# Patient Record
Sex: Male | Born: 1945 | Race: White | Hispanic: No | Smoking: Former smoker
Health system: Southern US, Community
[De-identification: ages and names within clinical notes are randomized; demographics above are authoritative.]

## PROBLEM LIST (undated history)

## (undated) DIAGNOSIS — I493 Ventricular premature depolarization: Secondary | ICD-10-CM

## (undated) DIAGNOSIS — I351 Nonrheumatic aortic (valve) insufficiency: Secondary | ICD-10-CM

## (undated) DIAGNOSIS — G43909 Migraine, unspecified, not intractable, without status migrainosus: Secondary | ICD-10-CM

## (undated) DIAGNOSIS — E785 Hyperlipidemia, unspecified: Secondary | ICD-10-CM

## (undated) DIAGNOSIS — I10 Essential (primary) hypertension: Secondary | ICD-10-CM

## (undated) DIAGNOSIS — N200 Calculus of kidney: Secondary | ICD-10-CM

## (undated) DIAGNOSIS — Q2381 Bicuspid aortic valve: Secondary | ICD-10-CM

## (undated) DIAGNOSIS — I2510 Atherosclerotic heart disease of native coronary artery without angina pectoris: Secondary | ICD-10-CM

## (undated) DIAGNOSIS — I4892 Unspecified atrial flutter: Secondary | ICD-10-CM

## (undated) DIAGNOSIS — H269 Unspecified cataract: Secondary | ICD-10-CM

## (undated) DIAGNOSIS — E291 Testicular hypofunction: Secondary | ICD-10-CM

## (undated) DIAGNOSIS — I712 Thoracic aortic aneurysm, without rupture, unspecified: Secondary | ICD-10-CM

## (undated) DIAGNOSIS — I219 Acute myocardial infarction, unspecified: Secondary | ICD-10-CM

## (undated) DIAGNOSIS — N183 Chronic kidney disease, stage 3 unspecified: Secondary | ICD-10-CM

## (undated) DIAGNOSIS — K602 Anal fissure, unspecified: Secondary | ICD-10-CM

## (undated) DIAGNOSIS — F32A Depression, unspecified: Secondary | ICD-10-CM

## (undated) DIAGNOSIS — H3560 Retinal hemorrhage, unspecified eye: Secondary | ICD-10-CM

## (undated) DIAGNOSIS — Q231 Congenital insufficiency of aortic valve: Secondary | ICD-10-CM

## (undated) DIAGNOSIS — N400 Enlarged prostate without lower urinary tract symptoms: Secondary | ICD-10-CM

## (undated) DIAGNOSIS — G4730 Sleep apnea, unspecified: Secondary | ICD-10-CM

## (undated) DIAGNOSIS — F329 Major depressive disorder, single episode, unspecified: Secondary | ICD-10-CM

## (undated) DIAGNOSIS — M1990 Unspecified osteoarthritis, unspecified site: Secondary | ICD-10-CM

## (undated) DIAGNOSIS — I77810 Thoracic aortic ectasia: Secondary | ICD-10-CM

## (undated) DIAGNOSIS — Z9289 Personal history of other medical treatment: Secondary | ICD-10-CM

## (undated) HISTORY — DX: Unspecified osteoarthritis, unspecified site: M19.90

## (undated) HISTORY — DX: Testicular hypofunction: E29.1

## (undated) HISTORY — DX: Essential (primary) hypertension: I10

## (undated) HISTORY — DX: Sleep apnea, unspecified: G47.30

## (undated) HISTORY — DX: Acute myocardial infarction, unspecified: I21.9

## (undated) HISTORY — DX: Calculus of kidney: N20.0

## (undated) HISTORY — DX: Anal fissure, unspecified: K60.2

## (undated) HISTORY — DX: Personal history of other medical treatment: Z92.89

## (undated) HISTORY — DX: Chronic kidney disease, stage 3 unspecified: N18.30

## (undated) HISTORY — DX: Hyperlipidemia, unspecified: E78.5

## (undated) HISTORY — DX: Unspecified cataract: H26.9

## (undated) HISTORY — DX: Thoracic aortic aneurysm, without rupture: I71.2

## (undated) HISTORY — DX: Migraine, unspecified, not intractable, without status migrainosus: G43.909

## (undated) HISTORY — DX: Unspecified atrial flutter: I48.92

## (undated) HISTORY — PX: COLONOSCOPY: SHX174

## (undated) HISTORY — DX: Nonrheumatic aortic (valve) insufficiency: I35.1

## (undated) HISTORY — DX: Congenital insufficiency of aortic valve: Q23.1

## (undated) HISTORY — DX: Major depressive disorder, single episode, unspecified: F32.9

## (undated) HISTORY — DX: Depression, unspecified: F32.A

## (undated) HISTORY — PX: OTHER SURGICAL HISTORY: SHX169

## (undated) HISTORY — DX: Ventricular premature depolarization: I49.3

## (undated) HISTORY — PX: TOOTH EXTRACTION: SUR596

## (undated) HISTORY — DX: Atherosclerotic heart disease of native coronary artery without angina pectoris: I25.10

## (undated) HISTORY — DX: Bicuspid aortic valve: Q23.81

## (undated) HISTORY — DX: Thoracic aortic ectasia: I77.810

## (undated) HISTORY — DX: Benign prostatic hyperplasia without lower urinary tract symptoms: N40.0

## (undated) HISTORY — DX: Thoracic aortic aneurysm, without rupture, unspecified: I71.20

## (undated) HISTORY — DX: Retinal hemorrhage, unspecified eye: H35.60

---
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
## 1982-07-22 HISTORY — PX: VARICOSE VEIN SURGERY: SHX832

## 1996-07-22 DIAGNOSIS — Z9289 Personal history of other medical treatment: Secondary | ICD-10-CM

## 1996-07-22 HISTORY — PX: CORONARY ARTERY BYPASS GRAFT: SHX141

## 1996-07-22 HISTORY — DX: Personal history of other medical treatment: Z92.89

## 1998-10-28 ENCOUNTER — Encounter: Payer: Self-pay | Admitting: Cardiology

## 2007-07-23 DIAGNOSIS — I779 Disorder of arteries and arterioles, unspecified: Secondary | ICD-10-CM

## 2007-07-23 HISTORY — DX: Disorder of arteries and arterioles, unspecified: I77.9

## 2013-07-28 ENCOUNTER — Ambulatory Visit: Payer: Self-pay | Admitting: Family Medicine

## 2013-07-28 DIAGNOSIS — R351 Nocturia: Secondary | ICD-10-CM | POA: Diagnosis not present

## 2013-07-28 DIAGNOSIS — R7989 Other specified abnormal findings of blood chemistry: Secondary | ICD-10-CM | POA: Diagnosis not present

## 2013-07-28 DIAGNOSIS — E291 Testicular hypofunction: Secondary | ICD-10-CM | POA: Diagnosis not present

## 2013-07-29 DIAGNOSIS — E291 Testicular hypofunction: Secondary | ICD-10-CM | POA: Diagnosis not present

## 2013-07-29 DIAGNOSIS — R351 Nocturia: Secondary | ICD-10-CM | POA: Diagnosis not present

## 2013-08-06 DIAGNOSIS — I10 Essential (primary) hypertension: Secondary | ICD-10-CM | POA: Diagnosis not present

## 2013-08-06 DIAGNOSIS — G43909 Migraine, unspecified, not intractable, without status migrainosus: Secondary | ICD-10-CM | POA: Diagnosis not present

## 2013-08-06 DIAGNOSIS — F3289 Other specified depressive episodes: Secondary | ICD-10-CM | POA: Diagnosis not present

## 2013-08-06 DIAGNOSIS — F329 Major depressive disorder, single episode, unspecified: Secondary | ICD-10-CM | POA: Diagnosis not present

## 2013-10-05 DIAGNOSIS — I10 Essential (primary) hypertension: Secondary | ICD-10-CM | POA: Diagnosis not present

## 2013-10-05 DIAGNOSIS — E785 Hyperlipidemia, unspecified: Secondary | ICD-10-CM | POA: Diagnosis not present

## 2013-10-05 DIAGNOSIS — G4700 Insomnia, unspecified: Secondary | ICD-10-CM | POA: Diagnosis not present

## 2013-10-05 DIAGNOSIS — G43909 Migraine, unspecified, not intractable, without status migrainosus: Secondary | ICD-10-CM | POA: Diagnosis not present

## 2013-10-05 DIAGNOSIS — I2510 Atherosclerotic heart disease of native coronary artery without angina pectoris: Secondary | ICD-10-CM | POA: Diagnosis not present

## 2013-10-05 DIAGNOSIS — F329 Major depressive disorder, single episode, unspecified: Secondary | ICD-10-CM | POA: Diagnosis not present

## 2013-10-12 DIAGNOSIS — H903 Sensorineural hearing loss, bilateral: Secondary | ICD-10-CM | POA: Diagnosis not present

## 2013-10-12 DIAGNOSIS — H9319 Tinnitus, unspecified ear: Secondary | ICD-10-CM | POA: Diagnosis not present

## 2013-11-02 DIAGNOSIS — I2510 Atherosclerotic heart disease of native coronary artery without angina pectoris: Secondary | ICD-10-CM | POA: Diagnosis not present

## 2013-11-02 DIAGNOSIS — E785 Hyperlipidemia, unspecified: Secondary | ICD-10-CM | POA: Diagnosis not present

## 2013-11-02 DIAGNOSIS — I10 Essential (primary) hypertension: Secondary | ICD-10-CM | POA: Diagnosis not present

## 2013-11-02 DIAGNOSIS — G43909 Migraine, unspecified, not intractable, without status migrainosus: Secondary | ICD-10-CM | POA: Diagnosis not present

## 2013-11-02 DIAGNOSIS — F341 Dysthymic disorder: Secondary | ICD-10-CM | POA: Diagnosis not present

## 2013-12-08 DIAGNOSIS — G43809 Other migraine, not intractable, without status migrainosus: Secondary | ICD-10-CM | POA: Diagnosis not present

## 2013-12-08 DIAGNOSIS — M79609 Pain in unspecified limb: Secondary | ICD-10-CM | POA: Diagnosis not present

## 2013-12-08 DIAGNOSIS — I10 Essential (primary) hypertension: Secondary | ICD-10-CM | POA: Diagnosis not present

## 2013-12-10 DIAGNOSIS — Z961 Presence of intraocular lens: Secondary | ICD-10-CM | POA: Diagnosis not present

## 2013-12-10 DIAGNOSIS — H26499 Other secondary cataract, unspecified eye: Secondary | ICD-10-CM | POA: Diagnosis not present

## 2013-12-17 DIAGNOSIS — M79609 Pain in unspecified limb: Secondary | ICD-10-CM | POA: Diagnosis not present

## 2013-12-22 DIAGNOSIS — H26499 Other secondary cataract, unspecified eye: Secondary | ICD-10-CM | POA: Diagnosis not present

## 2014-02-01 DIAGNOSIS — F325 Major depressive disorder, single episode, in full remission: Secondary | ICD-10-CM | POA: Diagnosis not present

## 2014-02-01 DIAGNOSIS — F411 Generalized anxiety disorder: Secondary | ICD-10-CM | POA: Diagnosis not present

## 2014-02-01 DIAGNOSIS — G43809 Other migraine, not intractable, without status migrainosus: Secondary | ICD-10-CM | POA: Diagnosis not present

## 2014-03-17 DIAGNOSIS — E291 Testicular hypofunction: Secondary | ICD-10-CM | POA: Diagnosis not present

## 2014-03-17 DIAGNOSIS — N529 Male erectile dysfunction, unspecified: Secondary | ICD-10-CM | POA: Diagnosis not present

## 2014-03-17 DIAGNOSIS — N401 Enlarged prostate with lower urinary tract symptoms: Secondary | ICD-10-CM | POA: Diagnosis not present

## 2014-03-17 DIAGNOSIS — B079 Viral wart, unspecified: Secondary | ICD-10-CM | POA: Diagnosis not present

## 2014-03-17 DIAGNOSIS — Z85828 Personal history of other malignant neoplasm of skin: Secondary | ICD-10-CM | POA: Diagnosis not present

## 2014-03-17 DIAGNOSIS — N138 Other obstructive and reflux uropathy: Secondary | ICD-10-CM | POA: Diagnosis not present

## 2014-03-17 DIAGNOSIS — Z87442 Personal history of urinary calculi: Secondary | ICD-10-CM | POA: Diagnosis not present

## 2014-03-24 DIAGNOSIS — F3289 Other specified depressive episodes: Secondary | ICD-10-CM | POA: Diagnosis not present

## 2014-03-24 DIAGNOSIS — G44009 Cluster headache syndrome, unspecified, not intractable: Secondary | ICD-10-CM | POA: Diagnosis not present

## 2014-03-24 DIAGNOSIS — Z951 Presence of aortocoronary bypass graft: Secondary | ICD-10-CM | POA: Diagnosis not present

## 2014-03-24 DIAGNOSIS — Z87442 Personal history of urinary calculi: Secondary | ICD-10-CM | POA: Diagnosis not present

## 2014-03-24 DIAGNOSIS — F329 Major depressive disorder, single episode, unspecified: Secondary | ICD-10-CM | POA: Diagnosis not present

## 2014-03-24 DIAGNOSIS — I10 Essential (primary) hypertension: Secondary | ICD-10-CM | POA: Diagnosis not present

## 2014-03-24 DIAGNOSIS — I2510 Atherosclerotic heart disease of native coronary artery without angina pectoris: Secondary | ICD-10-CM | POA: Diagnosis not present

## 2014-03-24 DIAGNOSIS — Z87891 Personal history of nicotine dependence: Secondary | ICD-10-CM | POA: Diagnosis not present

## 2014-03-24 DIAGNOSIS — E785 Hyperlipidemia, unspecified: Secondary | ICD-10-CM | POA: Diagnosis not present

## 2014-03-24 DIAGNOSIS — I080 Rheumatic disorders of both mitral and aortic valves: Secondary | ICD-10-CM | POA: Diagnosis not present

## 2014-03-24 DIAGNOSIS — R51 Headache: Secondary | ICD-10-CM | POA: Diagnosis not present

## 2014-04-01 DIAGNOSIS — N401 Enlarged prostate with lower urinary tract symptoms: Secondary | ICD-10-CM | POA: Diagnosis not present

## 2014-04-01 DIAGNOSIS — N138 Other obstructive and reflux uropathy: Secondary | ICD-10-CM | POA: Diagnosis not present

## 2014-04-01 DIAGNOSIS — E291 Testicular hypofunction: Secondary | ICD-10-CM | POA: Diagnosis not present

## 2014-04-11 DIAGNOSIS — G43809 Other migraine, not intractable, without status migrainosus: Secondary | ICD-10-CM | POA: Diagnosis not present

## 2014-04-11 DIAGNOSIS — F411 Generalized anxiety disorder: Secondary | ICD-10-CM | POA: Diagnosis not present

## 2014-06-15 DIAGNOSIS — H35353 Cystoid macular degeneration, bilateral: Secondary | ICD-10-CM | POA: Diagnosis not present

## 2014-06-15 DIAGNOSIS — H0012 Chalazion right lower eyelid: Secondary | ICD-10-CM | POA: Diagnosis not present

## 2014-06-15 DIAGNOSIS — Z961 Presence of intraocular lens: Secondary | ICD-10-CM | POA: Diagnosis not present

## 2014-08-29 DIAGNOSIS — N529 Male erectile dysfunction, unspecified: Secondary | ICD-10-CM | POA: Diagnosis not present

## 2014-08-29 DIAGNOSIS — E291 Testicular hypofunction: Secondary | ICD-10-CM | POA: Diagnosis not present

## 2014-08-29 DIAGNOSIS — N401 Enlarged prostate with lower urinary tract symptoms: Secondary | ICD-10-CM | POA: Diagnosis not present

## 2014-08-29 DIAGNOSIS — N200 Calculus of kidney: Secondary | ICD-10-CM | POA: Diagnosis not present

## 2014-09-19 ENCOUNTER — Telehealth: Payer: Self-pay | Admitting: *Deleted

## 2014-09-19 NOTE — Telephone Encounter (Signed)
Unable to complete call because patient's phone number is not in service.  eal

## 2014-09-20 DIAGNOSIS — L729 Follicular cyst of the skin and subcutaneous tissue, unspecified: Secondary | ICD-10-CM | POA: Diagnosis not present

## 2014-09-20 DIAGNOSIS — Z85828 Personal history of other malignant neoplasm of skin: Secondary | ICD-10-CM | POA: Diagnosis not present

## 2014-09-20 DIAGNOSIS — B353 Tinea pedis: Secondary | ICD-10-CM | POA: Diagnosis not present

## 2014-09-20 DIAGNOSIS — D1801 Hemangioma of skin and subcutaneous tissue: Secondary | ICD-10-CM | POA: Diagnosis not present

## 2014-09-20 DIAGNOSIS — L409 Psoriasis, unspecified: Secondary | ICD-10-CM | POA: Diagnosis not present

## 2014-09-20 DIAGNOSIS — L821 Other seborrheic keratosis: Secondary | ICD-10-CM | POA: Diagnosis not present

## 2014-09-20 DIAGNOSIS — L814 Other melanin hyperpigmentation: Secondary | ICD-10-CM | POA: Diagnosis not present

## 2014-09-20 DIAGNOSIS — L309 Dermatitis, unspecified: Secondary | ICD-10-CM | POA: Diagnosis not present

## 2014-09-21 ENCOUNTER — Ambulatory Visit (INDEPENDENT_AMBULATORY_CARE_PROVIDER_SITE_OTHER): Payer: Medicare Other | Admitting: Family

## 2014-09-21 ENCOUNTER — Telehealth: Payer: Self-pay | Admitting: Family

## 2014-09-21 ENCOUNTER — Encounter: Payer: Self-pay | Admitting: Family

## 2014-09-21 VITALS — BP 178/86 | HR 53 | Temp 98.6°F | Resp 16 | Ht 67.5 in | Wt 158.2 lb

## 2014-09-21 DIAGNOSIS — Z8669 Personal history of other diseases of the nervous system and sense organs: Secondary | ICD-10-CM | POA: Insufficient documentation

## 2014-09-21 DIAGNOSIS — F32A Depression, unspecified: Secondary | ICD-10-CM | POA: Insufficient documentation

## 2014-09-21 DIAGNOSIS — E785 Hyperlipidemia, unspecified: Secondary | ICD-10-CM | POA: Diagnosis not present

## 2014-09-21 DIAGNOSIS — F418 Other specified anxiety disorders: Secondary | ICD-10-CM

## 2014-09-21 DIAGNOSIS — I10 Essential (primary) hypertension: Secondary | ICD-10-CM

## 2014-09-21 DIAGNOSIS — D649 Anemia, unspecified: Secondary | ICD-10-CM

## 2014-09-21 DIAGNOSIS — F419 Anxiety disorder, unspecified: Secondary | ICD-10-CM

## 2014-09-21 DIAGNOSIS — N400 Enlarged prostate without lower urinary tract symptoms: Secondary | ICD-10-CM | POA: Diagnosis not present

## 2014-09-21 DIAGNOSIS — E291 Testicular hypofunction: Secondary | ICD-10-CM | POA: Insufficient documentation

## 2014-09-21 DIAGNOSIS — F329 Major depressive disorder, single episode, unspecified: Secondary | ICD-10-CM

## 2014-09-21 DIAGNOSIS — I2510 Atherosclerotic heart disease of native coronary artery without angina pectoris: Secondary | ICD-10-CM

## 2014-09-21 DIAGNOSIS — R739 Hyperglycemia, unspecified: Secondary | ICD-10-CM

## 2014-09-21 MED ORDER — PRAVASTATIN SODIUM 20 MG PO TABS: 20.0000 mg | ORAL_TABLET | Freq: Every day | ORAL | Status: DC

## 2014-09-21 MED ORDER — METOPROLOL SUCCINATE ER 50 MG PO TB24: 50.0000 mg | ORAL_TABLET | Freq: Every day | ORAL | Status: DC

## 2014-09-21 MED ORDER — TRAZODONE HCL 50 MG PO TABS: 50.0000 mg | ORAL_TABLET | Freq: Every day | ORAL | Status: DC

## 2014-09-21 NOTE — Assessment & Plan Note (Signed)
Symptoms improved on once daily cialis. managed by urology.

## 2014-09-21 NOTE — Patient Instructions (Signed)
Please follow up in 2 weeks for blood pressure recheck and blood work. (schedule an AM appointment) Stop Nadolol, start toprol xl. Welcome to [facility]!

## 2014-09-21 NOTE — Assessment & Plan Note (Signed)
To establish with Dr. Fransico Him for cardiology. On ASA, continue on beta blocker statin, clinically stable.

## 2014-09-21 NOTE — Assessment & Plan Note (Signed)
Tolerating statin, discussed diet/exercise. Obtain flp next visit and lft.

## 2014-09-21 NOTE — Assessment & Plan Note (Signed)
Currently stable. Monitor.  

## 2014-09-21 NOTE — Progress Notes (Signed)
Subjective:    Patient ID: [name], male    DOB: 1946-05-08, 69 y.o.   MRN: [id number]  HPI  [name] is a 69 yr old male who presents today to establish care.  He has several concerns he wishes to discuss today. Moved here 3 months ago from [place].   1) HTN- Patient is currently maintained on the following medications for blood pressure: nadolol.  Reports that the cost has become prohibitive.  He has been on this since 2008.  Patient reports good compliance with blood pressure medications. Patient denies chest pain, shortness of breath or swelling. Last 3 blood pressure readings in our office are as follows: BP Readings from Last 3 Encounters:  09/21/14 178/86   2) Hyperlipidemia-Patient is currently maintained on the following medication for hyperlipidemia: pravastatin Last lipid panel as follows: Patient denies myalgia. Patient reports poor compliance with low fat/low cholesterol diet.  Will work on diet and exercise.  3) Migraines- reports currently well controlled since he was placed on nadolol in 1998.   4) Anxiety/depression- reports that he has been on wellbutrin in the past. Reports that in the recent past his mother passed away, he went through a divorce, moved, retired.  No longer seeing his step grand children since his divorce which really bothers him.  Uses trazadone for insomnia. Sleeping better now that nocturia is well controlled on cialis.  + anxiety- reports that this has been an issue his "whole life." Reports that he has seen psychologists.  Reports that he uses the diazepam prn.   6) Hypogonadism-managed by Dr. Sherrye Payor  7) CAD- pt is s/p CABG 1998- Has apt to establish care with Dr. Radford Pax  BPH/ED/hypogonadism/nocturia- improved on cialis once daily.    Review of Systems  Constitutional:       Reports that he lost weight down to mid 140's now up to 155-158. Attributed weight loss to stress  HENT: Negative for rhinorrhea.        Wears hearing aids  since 2000  Eyes: Negative for visual disturbance.  Respiratory: Negative for cough.   Cardiovascular: Negative for chest pain.  Gastrointestinal: Negative for diarrhea.       Occasional constipation with stress/anxiety  Genitourinary: Negative for frequency.  Musculoskeletal:       Oa bilateral feet/hip, low back pain- mild pain  Skin: Positive for rash.       Follows with Dr. Renda Rolls- dermatology rash on both hanids, toenail funus, dry skin  Neurological: Positive for headaches.       See hpi  Hematological: Negative for adenopathy.       Past Medical History  Diagnosis Date  . Arthritis   . Depression   . Hypertension   . Hyperlipidemia   . History of blood transfusion 1998    "due to cardiac bypass surgery"  . Migraine   . Hypogonadism in male   . BPH (benign prostatic hyperplasia)     History   Social History  . Marital Status: Divorced    Spouse Name: N/A  . Number of Children: N/A  . Years of Education: N/A   Occupational History  . Not on file.   Social History Main Topics  . Smoking status: Former Smoker -- 20 years    Quit date: 07/22/1986  . Smokeless tobacco: Not on file  . Alcohol Use: Yes     Comment: 3-4 weekly  . Drug Use: No  . Sexual Activity: Not on file   Other Topics Concern  .  Not on file   Social History Narrative    Past Surgical History  Procedure Laterality Date  . Varicose vein surgery  1984  . Coronary artery bypass graft  1998    Family History  Problem Relation Age of Onset  . Arthritis Mother   . Hyperlipidemia Mother   . Arthritis Father   . Hyperlipidemia Father   . Heart disease Father   . Stroke Father   . Hypertension Father   . Kidney disease Paternal Grandfather     Allergies  Allergen Reactions  . Mushroom Extract Complex     Severe vertigo, nausea, headache    No current outpatient prescriptions on file prior to visit.   No current facility-administered medications on file prior to visit.     BP 178/86 mmHg  Pulse 53  Temp(Src) 98.6 F (37 C) (Oral)  Resp 16  Ht 5' 7.5" (1.715 m)  Wt 158 lb 3.2 oz (71.759 kg)  BMI 24.40 kg/m2  SpO2 99%    Objective:   Physical Exam  Constitutional: He is oriented to person, place, and time. He appears well-developed and well-nourished. No distress.  HENT:  Head: Normocephalic and atraumatic.  Right Ear: Tympanic membrane normal.  Left Ear: Tympanic membrane normal.  Dry skin bilateral canals  Cardiovascular: Normal rate and regular rhythm.   No murmur heard. Pulmonary/Chest: Effort normal and breath sounds normal. No respiratory distress. He has no wheezes. He has no rales.  Abdominal: Soft. Bowel sounds are normal. He exhibits no distension. There is no tenderness. There is no rebound.  Musculoskeletal: He exhibits no edema.  Neurological: He is alert and oriented to person, place, and time.  Skin: Skin is warm and dry.  Dry skin bilateral palms  Psychiatric: He has a normal mood and affect. His speech is normal and behavior is normal. Judgment and thought content normal. Cognition and memory are normal.  Mildly anxious appearing          Assessment & Plan:

## 2014-09-21 NOTE — Assessment & Plan Note (Signed)
Uncontrolled. He would like to d/c nadolol due to cost. Will d/c and begin toprol xl.

## 2014-09-21 NOTE — Assessment & Plan Note (Signed)
Stable.  Monitor.  

## 2014-09-21 NOTE — Telephone Encounter (Signed)
Please forward lab results to pt's urologist [facility]. He will complete week of 3/14. tks

## 2014-09-21 NOTE — Assessment & Plan Note (Signed)
This is being managed by urology, however he brings lab slip today from his urologist and wishes for [place] to add on his labs here in [place]. Will forward results to his urologist.

## 2014-10-03 DIAGNOSIS — H35073 Retinal telangiectasis, bilateral: Secondary | ICD-10-CM | POA: Diagnosis not present

## 2014-10-03 DIAGNOSIS — Z961 Presence of intraocular lens: Secondary | ICD-10-CM | POA: Diagnosis not present

## 2014-10-05 ENCOUNTER — Encounter: Payer: Self-pay | Admitting: Family

## 2014-10-05 ENCOUNTER — Telehealth: Payer: Self-pay | Admitting: *Deleted

## 2014-10-05 ENCOUNTER — Ambulatory Visit (INDEPENDENT_AMBULATORY_CARE_PROVIDER_SITE_OTHER): Payer: Medicare Other | Admitting: Family

## 2014-10-05 VITALS — BP 150/90 | HR 77 | Temp 98.0°F | Resp 16 | Ht 67.5 in | Wt 154.8 lb

## 2014-10-05 DIAGNOSIS — D649 Anemia, unspecified: Secondary | ICD-10-CM

## 2014-10-05 DIAGNOSIS — E785 Hyperlipidemia, unspecified: Secondary | ICD-10-CM

## 2014-10-05 DIAGNOSIS — I10 Essential (primary) hypertension: Secondary | ICD-10-CM | POA: Diagnosis not present

## 2014-10-05 DIAGNOSIS — N400 Enlarged prostate without lower urinary tract symptoms: Secondary | ICD-10-CM

## 2014-10-05 DIAGNOSIS — Z8669 Personal history of other diseases of the nervous system and sense organs: Secondary | ICD-10-CM

## 2014-10-05 DIAGNOSIS — E291 Testicular hypofunction: Secondary | ICD-10-CM | POA: Diagnosis not present

## 2014-10-05 DIAGNOSIS — R739 Hyperglycemia, unspecified: Secondary | ICD-10-CM | POA: Diagnosis not present

## 2014-10-05 LAB — CBC WITH DIFFERENTIAL/PLATELET
BASOS PCT: 0.3 % (ref 0.0–3.0)
Basophils Absolute: 0 10*3/uL (ref 0.0–0.1)
Eosinophils Absolute: 0.2 10*3/uL (ref 0.0–0.7)
Eosinophils Relative: 2.2 % (ref 0.0–5.0)
HEMATOCRIT: 39.5 % (ref 39.0–52.0)
HEMOGLOBIN: 13.7 g/dL (ref 13.0–17.0)
Lymphocytes Relative: 24.5 % (ref 12.0–46.0)
Lymphs Abs: 2 10*3/uL (ref 0.7–4.0)
MCHC: 34.5 g/dL (ref 30.0–36.0)
MCV: 99.3 fl (ref 78.0–100.0)
MONO ABS: 0.5 10*3/uL (ref 0.1–1.0)
Monocytes Relative: 6 % (ref 3.0–12.0)
NEUTROS ABS: 5.4 10*3/uL (ref 1.4–7.7)
NEUTROS PCT: 67 % (ref 43.0–77.0)
Platelets: 243 10*3/uL (ref 150.0–400.0)
RBC: 3.98 Mil/uL — ABNORMAL LOW (ref 4.22–5.81)
RDW: 13.2 % (ref 11.5–15.5)
WBC: 8 10*3/uL (ref 4.0–10.5)

## 2014-10-05 LAB — LIPID PANEL
CHOLESTEROL: 133 mg/dL (ref 0–200)
HDL: 43.3 mg/dL (ref >39.00)
LDL Cholesterol: 63 mg/dL (ref 0–99)
NonHDL: 89.7
TRIGLYCERIDES: 135 mg/dL (ref 0.0–149.0)
Total CHOL/HDL Ratio: 3
VLDL: 27 mg/dL (ref 0.0–40.0)

## 2014-10-05 LAB — BASIC METABOLIC PANEL
BUN: 14 mg/dL (ref 6–23)
CHLORIDE: 105 meq/L (ref 96–112)
CO2: 28 mEq/L (ref 19–32)
Calcium: 9.1 mg/dL (ref 8.4–10.5)
Creatinine, Ser: 1.24 mg/dL (ref 0.40–1.50)
GFR: 61.9 mL/min (ref >60.00)
GLUCOSE: 112 mg/dL — AB (ref 70–99)
POTASSIUM: 4.2 meq/L (ref 3.5–5.1)
SODIUM: 140 meq/L (ref 135–145)

## 2014-10-05 LAB — HEPATIC FUNCTION PANEL
ALT: 15 U/L (ref 0–53)
AST: 23 U/L (ref 0–37)
Albumin: 4.3 g/dL (ref 3.5–5.2)
Alkaline Phosphatase: 92 U/L (ref 39–117)
BILIRUBIN TOTAL: 0.4 mg/dL (ref 0.2–1.2)
Bilirubin, Direct: 0.1 mg/dL (ref 0.0–0.3)
Total Protein: 6.7 g/dL (ref 6.0–8.3)

## 2014-10-05 LAB — HEMOGLOBIN A1C: HEMOGLOBIN A1C: 5.9 % (ref 4.6–6.5)

## 2014-10-05 LAB — PSA: PSA: 0.2 ng/mL (ref 0.10–4.00)

## 2014-10-05 LAB — TESTOSTERONE: TESTOSTERONE: 119.88 ng/dL — AB (ref 300.00–890.00)

## 2014-10-05 MED ORDER — METOPROLOL SUCCINATE ER 100 MG PO TB24: 100.0000 mg | ORAL_TABLET | Freq: Every day | ORAL | Status: DC

## 2014-10-05 MED ORDER — BUTALBITAL-ASA-CAFF-CODEINE 50-325-40-30 MG PO CAPS: ORAL_CAPSULE | ORAL | Status: DC

## 2014-10-05 MED ORDER — DIAZEPAM 5 MG PO TABS: 5.0000 mg | ORAL_TABLET | Freq: Four times a day (QID) | ORAL | Status: DC | PRN

## 2014-10-05 NOTE — Telephone Encounter (Signed)
Pt requests labs be faxed to # [phone] once he completes them today.

## 2014-10-05 NOTE — Progress Notes (Signed)
Subjective:    Patient ID: [name], male    DOB: 30-Mar-1946, 69 y.o.   MRN: [id number]  HPI  [name] is a 69 yr old male who presents today for follow up.  HTN- Patient is currently maintained on the following medications for blood pressure: toprol xl Patient reports good compliance with blood pressure medications. Patient denies chest pain, shortness of breath. Occasional mild LE edema with "junk food." Last 3 blood pressure readings in our office are as follows: BP Readings from Last 3 Encounters:  10/05/14 150/90  09/21/14 178/86   Hyperlipidemia- Patient is currently maintained on the following medication for hyperlipidemia: pravastatin Last lipid panel as follows: not available Patient denies myalgia. Patient reports good compliance with low fat/low cholesterol diet.  Exercises 4-6 days a week.   Migraines-  Notes that fiorinal helps his headaches.  Brings letter from insurance re: need for formulary exception for fiorinal. Beta blockers help headaches.  Now having "tension headaches" 4-5 times a week and migraines 2 x weekly.  Uses fiorinal as needed for migraines, excedrin generally used for tension headache-last fiorinal was Monday AM. He will make apt with Dr. Maureen [name].    Anxiety- requesting refill of diazepam. Reports that he uses very sparingly.  Reports depression is well controlled off of wellbutrin.   Had eye exam with Dr. Delman [name] who saw "speck" on retina.  He has been referred to Dr. Zadie [name].   Has apt with [name] in April for cardiology.  Review of Systems See HPI  Past Medical History  Diagnosis Date  . Arthritis   . Depression   . Hypertension   . Hyperlipidemia   . History of blood transfusion 1998    "due to cardiac bypass surgery"  . Migraine   . Hypogonadism in male   . BPH (benign prostatic hyperplasia)     History   Social History  . Marital Status: Divorced    Spouse Name: N/A  . Number of Children: N/A  . Years of  Education: N/A   Occupational History  . Not on file.   Social History Main Topics  . Smoking status: Former Smoker -- 20 years    Quit date: 07/22/1986  . Smokeless tobacco: Not on file  . Alcohol Use: Yes     Comment: 3-4 weekly  . Drug Use: No  . Sexual Activity: Not on file   Other Topics Concern  . Not on file   Social History Narrative   Divorced   Secondary school teacher- retired   No children   Has a Neurosurgeon named [name]   Enjoys outdoor activities- hiking, swimming, Control and instrumentation engineer, baseball, writing, reading, cooking    Past Surgical History  Procedure Laterality Date  . Varicose vein surgery  1984  . Coronary artery bypass graft  1998    Family History  Problem Relation Age of Onset  . Arthritis Mother     deceased  . Hyperlipidemia Mother   . Arthritis Father   . Hyperlipidemia Father   . Heart disease Father 49  . Stroke Father 93    deceased  . Hypertension Father   . Kidney disease Paternal Grandfather     Allergies  Allergen Reactions  . Mushroom Extract Complex     Severe vertigo, nausea, headache    Current Outpatient Prescriptions on File Prior to Visit  Medication Sig Dispense Refill  . aspirin 325 MG EC tablet Take 325 mg by mouth daily.    . butalbital-aspirin-caffeine-codeine [facility] WITH  CODEINE) 50-325-40-30 MG capsule Take 1  - 2 every 8 hours as needed.    . diazepam (VALIUM) 5 MG tablet Take 5 mg by mouth every 6 (six) hours as needed for anxiety.    . metoprolol succinate (TOPROL XL) 50 MG 24 hr tablet Take 1 tablet (50 mg total) by mouth daily. Take with or immediately following a meal. 30 tablet 2  . pravastatin (PRAVACHOL) 20 MG tablet Take 1 tablet (20 mg total) by mouth daily. 30 tablet 2  . tadalafil (CIALIS) 5 MG tablet Take 5 mg by mouth daily as needed for erectile dysfunction.    . Testosterone (ANDROGEL PUMP TD) Place 2 Act onto the skin daily. 1.62% (75gm)    . traZODone (DESYREL) 50 MG tablet Take 1 tablet (50 mg total) by mouth at  bedtime. 30 tablet 2   No current facility-administered medications on file prior to visit.    BP 150/90 mmHg  Pulse 77  Temp(Src) 98 F (36.7 C) (Oral)  Resp 16  Ht 5' 7.5" (1.715 m)  Wt 154 lb 12.8 oz (70.217 kg)  BMI 23.87 kg/m2  SpO2 99%       Objective:   Physical Exam  Constitutional: He is oriented to person, place, and time. He appears well-developed and well-nourished. No distress.  HENT:  Head: Normocephalic and atraumatic.  Cardiovascular: Normal rate and regular rhythm.   No murmur heard. Pulmonary/Chest: Effort normal and breath sounds normal. No respiratory distress. He has no wheezes. He has no rales.  Musculoskeletal: He exhibits no edema.  Neurological: He is alert and oriented to person, place, and time.  Skin: Skin is warm and dry.  Psychiatric: He has a normal mood and affect. His behavior is normal. Thought content normal.          Assessment & Plan:

## 2014-10-05 NOTE — Assessment & Plan Note (Signed)
Improved but still above goal. Will increase toprol xl from 50mg  to 100 mg. Follow up in 1 month.

## 2014-10-05 NOTE — Patient Instructions (Signed)
Please complete lab work prior to leaving (blood and urine drug screen) Increase toprol xl from 50mg  to 100mg .   Follow up in 1 month.

## 2014-10-05 NOTE — Assessment & Plan Note (Addendum)
Uncontrolled. Advises pt to schedule apt with neurology. He is requesting refill on his controlled substances today (valium and fiorinal).  Advised pt that in order to refill these meds he needs to sign a controlled substance contract and provide UDS.  He admits to marijuana use and reports last use was yesterday.  Advised pt that we will need a clean uds before we can provide refills. He wishes to defer UDS for now. Refill rx's were shredded and not given to pt as ordered.

## 2014-10-05 NOTE — Telephone Encounter (Signed)
Results faxed to below #.

## 2014-10-05 NOTE — Assessment & Plan Note (Signed)
Tolerating statin, obtain lipid panel.  

## 2014-10-05 NOTE — Telephone Encounter (Signed)
PA for ASCOMP/CODEINE 30 mg initiated. Awaiting determination. JG//CMA

## 2014-10-05 NOTE — Telephone Encounter (Addendum)
Please advise pt that his insurance has denied fiorinal with codeine despite our request.  I would recommend that he meet with neurology to discuss alternative treatment for his headaches.

## 2014-10-05 NOTE — Telephone Encounter (Signed)
Received denial from Medicare Part D because use of the med is not supported byt he Food and Drug Administration or by one of th Medicare approved references (see fax).  Please advise alternative?

## 2014-10-05 NOTE — Progress Notes (Signed)
Pre visit review using our clinic review tool, if applicable. No additional management support is needed unless otherwise documented below in the visit note. 

## 2014-10-07 NOTE — Telephone Encounter (Signed)
Notified pt. 

## 2014-10-08 ENCOUNTER — Telehealth: Payer: Self-pay | Admitting: Family

## 2014-10-08 NOTE — Telephone Encounter (Signed)
See my chart message

## 2014-10-10 DIAGNOSIS — N401 Enlarged prostate with lower urinary tract symptoms: Secondary | ICD-10-CM | POA: Diagnosis not present

## 2014-10-10 DIAGNOSIS — N529 Male erectile dysfunction, unspecified: Secondary | ICD-10-CM | POA: Diagnosis not present

## 2014-10-10 DIAGNOSIS — E291 Testicular hypofunction: Secondary | ICD-10-CM | POA: Diagnosis not present

## 2014-10-13 DIAGNOSIS — H35073 Retinal telangiectasis, bilateral: Secondary | ICD-10-CM | POA: Diagnosis not present

## 2014-10-13 DIAGNOSIS — H35363 Drusen (degenerative) of macula, bilateral: Secondary | ICD-10-CM | POA: Diagnosis not present

## 2014-10-19 ENCOUNTER — Encounter: Payer: Self-pay | Admitting: Family

## 2014-10-21 MED ORDER — TRAZODONE HCL 100 MG PO TABS: 100.0000 mg | ORAL_TABLET | Freq: Every day | ORAL | Status: DC

## 2014-11-01 ENCOUNTER — Ambulatory Visit: Payer: Self-pay | Admitting: Cardiology

## 2014-11-04 ENCOUNTER — Encounter: Payer: Self-pay | Admitting: Family

## 2014-11-04 ENCOUNTER — Ambulatory Visit (INDEPENDENT_AMBULATORY_CARE_PROVIDER_SITE_OTHER): Payer: Medicare Other | Admitting: Family

## 2014-11-04 ENCOUNTER — Telehealth: Payer: Self-pay

## 2014-11-04 VITALS — BP 152/80 | HR 60 | Temp 98.0°F | Resp 16 | Ht 67.5 in | Wt 153.2 lb

## 2014-11-04 DIAGNOSIS — Z23 Encounter for immunization: Secondary | ICD-10-CM

## 2014-11-04 DIAGNOSIS — E291 Testicular hypofunction: Secondary | ICD-10-CM | POA: Diagnosis not present

## 2014-11-04 DIAGNOSIS — H3560 Retinal hemorrhage, unspecified eye: Secondary | ICD-10-CM

## 2014-11-04 DIAGNOSIS — I10 Essential (primary) hypertension: Secondary | ICD-10-CM

## 2014-11-04 DIAGNOSIS — I2510 Atherosclerotic heart disease of native coronary artery without angina pectoris: Secondary | ICD-10-CM

## 2014-11-04 DIAGNOSIS — G4700 Insomnia, unspecified: Secondary | ICD-10-CM

## 2014-11-04 HISTORY — DX: Retinal hemorrhage, unspecified eye: H35.60

## 2014-11-04 MED ORDER — AMLODIPINE BESYLATE 5 MG PO TABS: 5.0000 mg | ORAL_TABLET | Freq: Every day | ORAL | Status: DC

## 2014-11-04 NOTE — Assessment & Plan Note (Signed)
Fair control with trazodone.  Reports that he wishes to remain "off of sleeping pills."

## 2014-11-04 NOTE — Assessment & Plan Note (Signed)
Remains uncontrolled.  Add amlodipine. Continue current dose of toprol xl.

## 2014-11-04 NOTE — Progress Notes (Signed)
Subjective:    Patient ID: [name], male    DOB: [birth year], 69 y.o.   MRN: [id number]  HPI  [name] is a 69 yr old male who presents today for follow up.    1) HTN-  Patient is currently maintained on the following medications for blood pressure: toprol xl which was increased from 50 to 100mg . Patient reports good compliance with blood pressure medications. Patient denies chest pain, shortness of breath or swelling. Last 3 blood pressure readings in our office are as follows: BP Readings from Last 3 Encounters:  11/04/14 152/80  10/05/14 150/90  09/21/14 178/86   Migraine- His insurance denied fiorial with codeine.  Will establish with Dr. Maureen Chatters.    Insomnia- reported that trazodone was not performing well as a sedative and was increased to 100mg  on 10/21/14.  Notes slight benefit on current dose of trazodone  Reports that he found a bottle of diazepam and fiorinol when he was unpacking.  Has started exercising again.   Saw Dr. Zadie Rhine- was told mild retinal hemorrhage  Does not some mild gerd sxs.     Review of Systems Past Medical History  Diagnosis Date  . Arthritis   . Depression   . Hypertension   . Hyperlipidemia   . History of blood transfusion 1998    "due to cardiac bypass surgery"  . Migraine   . Hypogonadism in male   . BPH (benign prostatic hyperplasia)   . 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 Marital Status: Divorced    Spouse Name: N/A  . Number of Children: N/A  . Years of Education: N/A   Occupational History  . Not on file.   Social History Main Topics  . Smoking status: Former Smoker -- 20 years    Quit date: 07/22/1986  . Smokeless tobacco: Not on file  . Alcohol Use: Yes     Comment: 3-4 weekly  . Drug Use: No  . Sexual Activity: Not on file   Other Topics Concern  . Not on file   Social History Narrative   Divorced   Secondary school teacher- retired   No children   Has a Neurosurgeon named [name]   Enjoys  outdoor activities- hiking, swimming, Control and instrumentation engineer, baseball, writing, reading, cooking    Past Surgical History  Procedure Laterality Date  . Varicose vein surgery  1984  . Coronary artery bypass graft  1998    Family History  Problem Relation Age of Onset  . Arthritis Mother     deceased  . Hyperlipidemia Mother   . Arthritis Father   . Hyperlipidemia Father   . Heart disease Father 28  . Stroke Father 35    deceased  . Hypertension Father   . Kidney disease Paternal Grandfather     Allergies  Allergen Reactions  . Mushroom Extract Complex     Severe vertigo, nausea, headache    Current Outpatient Prescriptions on File Prior to Visit  Medication Sig Dispense Refill  . Ascorbic Acid (VITAMIN C) 1000 MG tablet Take 1,000 mg by mouth 2 (two) times daily.    Marland Kitchen aspirin 325 MG EC tablet Take 325 mg by mouth daily.    . butalbital-aspirin-caffeine-codeine (FIORINAL WITH CODEINE) 50-325-40-30 MG capsule Take 1  - 2 tablets every 8 hours as needed. 30 capsule 0  . diazepam (VALIUM) 5 MG tablet Take 1 tablet (5 mg total) by mouth every 6 (six) hours as needed for anxiety. [place]  tablet 0  . econazole nitrate 1 % cream Apply 1 application topically 2 (two) times daily.    . fluocinonide cream (LIDEX) 1.21 % Apply 1 application topically 2 (two) times daily.    . metoprolol succinate (TOPROL XL) 100 MG 24 hr tablet Take 1 tablet (100 mg total) by mouth daily. Take with or immediately following a meal. 30 tablet 2  . mometasone (ELOCON) 0.1 % lotion Place 2 drops externally to the affected area 2 times daily as needed for ears for 14 days.  1  . Multiple Vitamins-Minerals (CENTRUM ADULTS PO) Take 1 tablet by mouth daily.    . niacin 500 MG tablet Take 500 mg by mouth at bedtime.    . pravastatin (PRAVACHOL) 20 MG tablet Take 1 tablet (20 mg total) by mouth daily. 30 tablet 2  . tadalafil (CIALIS) 5 MG tablet Take 5 mg by mouth daily as needed for erectile dysfunction.    . traZODone  (DESYREL) 100 MG tablet Take 1 tablet (100 mg total) by mouth at bedtime. 30 tablet 0  . vitamin E 400 UNIT capsule Take 400 Units by mouth daily.     No current facility-administered medications on file prior to visit.    BP 152/80 mmHg  Pulse 60  Temp(Src) 98 F (36.7 C) (Oral)  Resp 16  Ht 5' 7.5" (1.715 m)  Wt 153 lb 3.2 oz (69.491 kg)  BMI 23.63 kg/m2  SpO2 99%       Objective:   Physical Exam  Constitutional: He is oriented to person, place, and time. He appears well-developed and well-nourished. No distress.  HENT:  Head: Normocephalic and atraumatic.  Cardiovascular: Normal rate and regular rhythm.   No murmur heard. Pulmonary/Chest: Effort normal and breath sounds normal. No respiratory distress. He has no wheezes. He has no rales.  Musculoskeletal: He exhibits no edema.  Neurological: He is alert and oriented to person, place, and time.  Skin: Skin is warm and dry.  Psychiatric: He has a normal mood and affect. His behavior is normal. Thought content normal.          Assessment & Plan:

## 2014-11-04 NOTE — Telephone Encounter (Signed)
Records faxed from [facility].

## 2014-11-04 NOTE — Assessment & Plan Note (Signed)
New, being managed by dr. Zadie Rhine, mild per pt.

## 2014-11-04 NOTE — Assessment & Plan Note (Signed)
To be placed on fortesta by his urologist.

## 2014-11-04 NOTE — Assessment & Plan Note (Signed)
Has apt with dr. Radford Pax 6/16.

## 2014-11-04 NOTE — Addendum Note (Signed)
Addended by: Kelle Darting A on: 11/04/2014 10:10 AM   Modules accepted: Orders

## 2014-11-04 NOTE — Patient Instructions (Signed)
Please start amlodipine 5mg  once daily for blood pressure.  Please follow up in 1 month so we can repeat your blood pressure.

## 2014-11-14 ENCOUNTER — Encounter: Payer: Self-pay | Admitting: Family

## 2014-11-22 DIAGNOSIS — M79641 Pain in right hand: Secondary | ICD-10-CM | POA: Diagnosis not present

## 2014-11-22 DIAGNOSIS — S92501A Displaced unspecified fracture of right lesser toe(s), initial encounter for closed fracture: Secondary | ICD-10-CM | POA: Diagnosis not present

## 2014-11-22 DIAGNOSIS — M79671 Pain in right foot: Secondary | ICD-10-CM | POA: Diagnosis not present

## 2014-11-22 DIAGNOSIS — S63501A Unspecified sprain of right wrist, initial encounter: Secondary | ICD-10-CM | POA: Diagnosis not present

## 2014-11-23 ENCOUNTER — Other Ambulatory Visit: Payer: Self-pay | Admitting: Family

## 2014-11-23 DIAGNOSIS — M85439 Solitary bone cyst, unspecified ulna and radius: Secondary | ICD-10-CM | POA: Diagnosis not present

## 2014-11-23 DIAGNOSIS — M19031 Primary osteoarthritis, right wrist: Secondary | ICD-10-CM | POA: Diagnosis not present

## 2014-11-23 DIAGNOSIS — M109 Gout, unspecified: Secondary | ICD-10-CM | POA: Diagnosis not present

## 2014-11-23 DIAGNOSIS — Z6823 Body mass index (BMI) 23.0-23.9, adult: Secondary | ICD-10-CM | POA: Diagnosis not present

## 2014-11-23 NOTE — Telephone Encounter (Signed)
Pt is due for 1 month f/u on 12/04/14 and has not scheduled.  Please advise refill.   Medication name:  Name from pharmacy:  traZODone (DESYREL) 100 MG tablet TRAZODONE HCL 100 MG TABLET     Sig: TAKE 1 TABLET BY MOUTH NIGHTLY AT BEDTIME    Dispense: 30 tablet   Start: 11/23/2014   Class: Normal    Requested on: 10/21/2014    Originally ordered on: 10/21/2014 10/21/2014

## 2014-12-09 DIAGNOSIS — M109 Gout, unspecified: Secondary | ICD-10-CM | POA: Diagnosis not present

## 2014-12-13 DIAGNOSIS — L720 Epidermal cyst: Secondary | ICD-10-CM | POA: Diagnosis not present

## 2014-12-13 DIAGNOSIS — L723 Sebaceous cyst: Secondary | ICD-10-CM | POA: Diagnosis not present

## 2014-12-13 DIAGNOSIS — A630 Anogenital (venereal) warts: Secondary | ICD-10-CM | POA: Diagnosis not present

## 2014-12-13 DIAGNOSIS — D485 Neoplasm of uncertain behavior of skin: Secondary | ICD-10-CM | POA: Diagnosis not present

## 2014-12-14 ENCOUNTER — Other Ambulatory Visit: Payer: Self-pay | Admitting: Family

## 2014-12-14 DIAGNOSIS — Z6823 Body mass index (BMI) 23.0-23.9, adult: Secondary | ICD-10-CM | POA: Diagnosis not present

## 2014-12-14 DIAGNOSIS — M85439 Solitary bone cyst, unspecified ulna and radius: Secondary | ICD-10-CM | POA: Diagnosis not present

## 2014-12-14 DIAGNOSIS — M19031 Primary osteoarthritis, right wrist: Secondary | ICD-10-CM | POA: Diagnosis not present

## 2014-12-14 DIAGNOSIS — L98499 Non-pressure chronic ulcer of skin of other sites with unspecified severity: Secondary | ICD-10-CM | POA: Diagnosis not present

## 2014-12-14 NOTE — Telephone Encounter (Signed)
Mailed letter to pt

## 2014-12-14 NOTE — Telephone Encounter (Signed)
Pt due for 1 month follow up of blood pressure now.  Please call pt to arrange appt.

## 2014-12-14 NOTE — Telephone Encounter (Signed)
Left detailed message informing patient of med refill and to call and schedule appointment

## 2014-12-22 ENCOUNTER — Encounter: Payer: Self-pay | Admitting: Cardiology

## 2014-12-22 ENCOUNTER — Ambulatory Visit (INDEPENDENT_AMBULATORY_CARE_PROVIDER_SITE_OTHER): Payer: Medicare Other | Admitting: Cardiology

## 2014-12-22 VITALS — BP 190/92 | HR 57 | Ht 67.5 in | Wt 151.0 lb

## 2014-12-22 DIAGNOSIS — E785 Hyperlipidemia, unspecified: Secondary | ICD-10-CM | POA: Diagnosis not present

## 2014-12-22 DIAGNOSIS — Q231 Congenital insufficiency of aortic valve: Secondary | ICD-10-CM | POA: Diagnosis not present

## 2014-12-22 DIAGNOSIS — I2510 Atherosclerotic heart disease of native coronary artery without angina pectoris: Secondary | ICD-10-CM

## 2014-12-22 DIAGNOSIS — I10 Essential (primary) hypertension: Secondary | ICD-10-CM | POA: Diagnosis not present

## 2014-12-22 MED ORDER — AMLODIPINE BESYLATE 10 MG PO TABS: 10.0000 mg | ORAL_TABLET | Freq: Every day | ORAL | Status: DC

## 2014-12-22 NOTE — Patient Instructions (Signed)
Medication Instructions:  Your physician has recommended you make the following change in your medication:  1) INCREASE AMLODIPINE to 10 mg daily  Labwork: None  Testing/Procedures: Your physician has requested that you have an echocardiogram. Echocardiography is a painless test that uses sound waves to create images of your heart. It provides your doctor with information about the size and shape of your heart and how well your heart's chambers and valves are working. This procedure takes approximately one hour. There are no restrictions for this procedure.  Follow-Up: Please call our office to schedule your BLOOD PRESSURE check early next week.  Your physician wants you to follow-up in: 1 year with Dr. Radford Pax. You will receive a reminder letter in the mail two months in advance. If you don't receive a letter, please call our office to schedule the follow-up appointment.   Any Other Special Instructions Will Be Listed Below (If Applicable).

## 2014-12-22 NOTE — Progress Notes (Signed)
Cardiology Office Note   Date:  12/22/2014   ID:  [name], DOB 12-28-1945, MRN [id number]  PCP:  Nance Pear., NP    Chief Complaint  Patient presents with  . New Evaluation      History of Present Illness: [name] is a 69 y.o. male who presents for establishment of a new Cardiologist.  He has a history of CAD with CABG in 1998 after presenting with USAP.  He is very active and exercises without any problems.  He has an ETT in 2007 that showed no ischemia and last EF was 60% on echo.  He has a history of mild MR/AR and TR on echo with possible bicuspid AV and mildly dilated aortic root.    He has mild carotid artery stenosis on dopplers.  He has a history of dyslipidemia.  He has a history of mild renal insufficiency felt secondary to hypertensive nephrosclerosis.  He has not seen a Cardiologist since the fall of 2014.  He denies any chest pain or pressure, SOB, DOE, dizziness, palpitations or syncope.  He occasionally has some ankle edema.  He recently sprained his right arm and then tripped and injured his right foot.  He has been in a lot of pain from his hand and is seeing ortho.    Past Medical History  Diagnosis Date  . Arthritis   . Depression   . Hypertension   . Hyperlipidemia   . History of blood transfusion 1998    "due to cardiac bypass surgery"  . Migraine   . Hypogonadism in male   . BPH (benign prostatic hyperplasia)   . Retinal hemorrhage 11/04/2014    Past Surgical History  Procedure Laterality Date  . Varicose vein surgery  1984  . Coronary artery bypass graft  1998     Current Outpatient Prescriptions  Medication Sig Dispense Refill  . amLODipine (NORVASC) 5 MG tablet Take 1 tablet (5 mg total) by mouth daily. 30 tablet 2  . Ascorbic Acid (VITAMIN C) 1000 MG tablet Take 1,000 mg by mouth 2 (two) times daily.    Marland Kitchen aspirin 325 MG EC tablet Take 325 mg by mouth daily.    . butalbital-aspirin-caffeine-codeine  (FIORINAL WITH CODEINE) 50-325-40-30 MG capsule Take 1  - 2 tablets every 8 hours as needed. 30 capsule 0  . diazepam (VALIUM) 5 MG tablet Take 1 tablet (5 mg total) by mouth every 6 (six) hours as needed for anxiety. 30 tablet 0  . econazole nitrate 1 % cream Apply 1 application topically 2 (two) times daily.    . fluocinonide cream (LIDEX) 3.50 % Apply 1 application topically 2 (two) times daily.    . meloxicam (MOBIC) 15 MG tablet Take 15 mg by mouth daily.    . metoprolol succinate (TOPROL-XL) 100 MG 24 hr tablet Take 1 tablet (100 mg total) by mouth daily. Take with or immediately following a meal. 30 tablet 0  . mometasone (ELOCON) 0.1 % lotion Place 2 drops externally to the affected area 2 times daily as needed for ears for 14 days.  1  . Multiple Vitamins-Minerals (CENTRUM ADULTS PO) Take 1 tablet by mouth daily.    . niacin 500 MG tablet Take 500 mg by mouth at bedtime.    . pravastatin (PRAVACHOL) 20 MG tablet TAKE 1 TABLET BY MOUTH EVERY DAY 30 tablet 5  . tadalafil (CIALIS) 5 MG  tablet Take 5 mg by mouth daily as needed for erectile dysfunction.    . Testosterone (FORTESTA TD) Place 2 Act onto the skin daily. 1.62%    . traMADol (ULTRAM) 50 MG tablet Take 50 mg by mouth daily.    . traZODone (DESYREL) 100 MG tablet TAKE 1 TABLET BY MOUTH NIGHTLY AT BEDTIME 30 tablet 2  . vitamin E 400 UNIT capsule Take 400 Units by mouth daily.     No current facility-administered medications for this visit.    Allergies:   Mushroom extract complex    Social History:  The patient  reports that he quit smoking about 28 years ago. He does not have any smokeless tobacco history on file. He reports that he drinks alcohol. He reports that he does not use illicit drugs.   Family History:  The patient's family history includes Arthritis in his father and mother; Heart disease (age of onset: 65) in his father; Hyperlipidemia in his father and mother; Hypertension in his father; Kidney disease in his  paternal grandfather; Stroke (age of onset: 2) in his father.    ROS:  Please see the history of present illness.   Otherwise, review of systems are positive for none.   All other systems are reviewed and negative.    PHYSICAL EXAM: VS:  BP 190/92 mmHg  Pulse 57  Ht 5' 7.5" (1.715 m)  Wt 151 lb (68.493 kg)  BMI 23.29 kg/m2 , BMI Body mass index is 23.29 kg/(m^2). GEN: Well nourished, well developed, in no acute distress HEENT: normal Neck: no JVD, carotid bruits, or masses Cardiac: RRR; no murmurs, rubs, or gallops,no edema  Respiratory:  clear to auscultation bilaterally, normal work of breathing GI: soft, nontender, nondistended, + BS MS: no deformity or atrophy Skin: warm and dry, no rash Neuro:  Strength and sensation are intact Psych: euthymic mood, full affect   EKG:  EKG is ordered today. The ekg ordered today demonstrates sinus bradycardia at 57bpm with septal infarct and no ST changes   Recent Labs: 10/05/2014: ALT 15; BUN 14; Creatinine 1.24; Hemoglobin 13.7; Platelets 243.0; Potassium 4.2; Sodium 140    Lipid Panel    Component Value Date/Time   CHOL 133 10/05/2014 0931   TRIG 135.0 10/05/2014 0931   HDL 43.30 10/05/2014 0931   CHOLHDL 3 10/05/2014 0931   VLDL 27.0 10/05/2014 0931   LDLCALC 63 10/05/2014 0931      Wt Readings from Last 3 Encounters:  12/22/14 151 lb (68.493 kg)  11/04/14 153 lb 3.2 oz (69.491 kg)  10/05/14 154 lb 12.8 oz (70.217 kg)        ASSESSMENT AND PLAN:  1.  ASCAD with remote CABG.  He has no exertional angina and is very active with exercise regimen.  He is had been taking ASA 325mg  daily but was told to drop it to 81mg  daily due to taking meloxicam.  I have instructed him to stay on the lower dose of ASA to prevent increased risk of bleeding.   2.  HTN - BP markedly elevated today.  He has not been checking his bp and does not have a BP duff.  I will increase his amlodipine to 10mg  daily since we cannot titrated BB further  due to bradycardia. 3.  Dyslipidemia - I will get a copy of his lipid from his PCP 4.  ? Bicuspid AV with mildly dilated aortic root by echo 2007 - will repat 2D echo   Current medicines are reviewed at  length with the patient today.  The patient does not have concerns regarding medicines.  The following changes have been made:  no change  Labs/ tests ordered today: See above Assessment and Plan No orders of the defined types were placed in this encounter.     Disposition:   FU with me in 1 year and with Nurse for BP check on Monday  Lurena Nida, MD  12/22/2014 11:28 AM    [facility] [place], [place], [place]  [address] Phone: [phone]; Fax: [phone]

## 2014-12-23 ENCOUNTER — Encounter: Payer: Self-pay | Admitting: Family

## 2014-12-30 ENCOUNTER — Other Ambulatory Visit: Payer: Self-pay

## 2014-12-30 ENCOUNTER — Encounter: Payer: Self-pay | Admitting: *Deleted

## 2014-12-30 ENCOUNTER — Ambulatory Visit (INDEPENDENT_AMBULATORY_CARE_PROVIDER_SITE_OTHER): Payer: Medicare Other | Admitting: *Deleted

## 2014-12-30 ENCOUNTER — Ambulatory Visit (HOSPITAL_COMMUNITY): Payer: Medicare Other | Attending: Internal Medicine

## 2014-12-30 VITALS — BP 170/90 | HR 72 | Wt 151.0 lb

## 2014-12-30 DIAGNOSIS — I10 Essential (primary) hypertension: Secondary | ICD-10-CM

## 2014-12-30 DIAGNOSIS — I071 Rheumatic tricuspid insufficiency: Secondary | ICD-10-CM | POA: Diagnosis not present

## 2014-12-30 DIAGNOSIS — I351 Nonrheumatic aortic (valve) insufficiency: Secondary | ICD-10-CM | POA: Diagnosis not present

## 2014-12-30 DIAGNOSIS — I059 Rheumatic mitral valve disease, unspecified: Secondary | ICD-10-CM | POA: Diagnosis not present

## 2014-12-30 DIAGNOSIS — Q231 Congenital insufficiency of aortic valve: Secondary | ICD-10-CM | POA: Diagnosis not present

## 2014-12-30 NOTE — Progress Notes (Signed)
Reason for visit: BP check due to increasing Amlodipine 10 mg daily. 1.) Name of MD requesting visit: Dr. Fransico Him  2.) H&P: Hx of Hypertension  3.) ROS related to problem: Here for BP check and Echo. No c/o today.  Very "angry" with the world.  States he is very anxious and knows BP will be elevated. BP today 170/90  HR 72. States he has been taking his medications as ordered. Does not take BP at home and does not plan on getting a cuff to take BP.  States he has had problems with health care every since moving to [facility].  He is from [place] and came here to take care of his Mom who has since died.  Also is wearing a foot brace on (L) foot and also brace on (R) arm from sport injury. When walked him back to waiting area for Echo he threw magazine down and said he ever since moving here his BP has been elevated.  4.) Assessment and plan per MD: Will send to Dr. Radford Pax for recommendations.

## 2014-12-30 NOTE — Patient Instructions (Signed)
Continue to take medications.  Will advise if Dr. Radford Pax has any further recommendations.

## 2014-12-31 NOTE — Progress Notes (Signed)
Add Losartan 25mg  daily and come back to pharmacy clinic for BP check in 1 week

## 2015-01-02 ENCOUNTER — Telehealth: Payer: Self-pay

## 2015-01-02 ENCOUNTER — Encounter: Payer: Self-pay | Admitting: Cardiology

## 2015-01-02 DIAGNOSIS — M25531 Pain in right wrist: Secondary | ICD-10-CM | POA: Diagnosis not present

## 2015-01-02 DIAGNOSIS — M79641 Pain in right hand: Secondary | ICD-10-CM | POA: Diagnosis not present

## 2015-01-02 DIAGNOSIS — R609 Edema, unspecified: Secondary | ICD-10-CM | POA: Diagnosis not present

## 2015-01-02 DIAGNOSIS — M25449 Effusion, unspecified hand: Secondary | ICD-10-CM | POA: Diagnosis not present

## 2015-01-02 DIAGNOSIS — M79631 Pain in right forearm: Secondary | ICD-10-CM | POA: Diagnosis not present

## 2015-01-02 MED ORDER — LOSARTAN POTASSIUM 25 MG PO TABS: 25.0000 mg | ORAL_TABLET | Freq: Every day | ORAL | Status: DC

## 2015-01-02 NOTE — Telephone Encounter (Signed)
-----   Message from Sueanne Margarita, MD sent at 12/31/2014  3:31 PM EDT -----   ----- Message -----    From: Hetty Blend, RN    Sent: 12/30/2014   1:31 PM      To: Sueanne Margarita, MD

## 2015-01-02 NOTE — Telephone Encounter (Signed)
Drew Margarita, MD at 12/31/2014 3:31 PM     Status: Signed       Expand All Collapse All   Add Losartan 25mg  daily and come back to pharmacy clinic for BP check in 1 week        Left message to call back.

## 2015-01-02 NOTE — Telephone Encounter (Signed)
Follow up ° ° ° ° ° °Returning Katy's call °

## 2015-01-02 NOTE — Telephone Encounter (Signed)
Received MyChart message from patient: ----- Message -----      From: [name]     Sent: 01/02/2015  5:59 PM      To: [facility] Triage    Subject: Non-Urgent Medical Question                 I am very upset that no one will listen to me regarding my BP. Contributing to my BP are:1) totally uncontrolled anxiety; 2) totally uncontrolled migraine headaches; 3) a sprained right wrist being poorly treated; 4) a broken toe; 5) being unable to work out at the gym due to these two injuries; and 6) "white coat." I moved here a few months ago from out [place] in good health. Throwing yet another BP med into my regimen will not work. I am angry at [facility] and when I come in for the BP check next week with Gay Filler I will be angry then. And that's reason number 7 for my BP. [place] and 325 enteric aspirin. Now 3 BP meds and 81 mg aspirin, a 75% cut in dosage and I am a heart attack and/or stroke WAITING TO HAPPEN...    Informed the patient that [facility] is listening to him and we do hear his concerns.  Reviewed every numbered concern with patient as contributing factors to his hypertension. Informed him that all of his issues except his BP are out of our control.  Spoke with patient at great length about solving his anxiety problems at the root of its cause and maybe his BP would improve.  Apologized the patient has not been able to fix his broken wrist and toe, but reminded him that we do not treat broken bones - we treat elevated blood pressure. In the end, patient grateful for callback and ideas. He st he will call his PCP to address anxiety and see if there is anything she can do to help. Patient to fill Losartan Rx and is keeping his appointment with Gay Filler next week.

## 2015-01-02 NOTE — Telephone Encounter (Signed)
Instructed patient to START LOSARTAN 25 mg daily. Blood Pressure Clinic OV scheduled for next Monday. Patient agrees with treatment plan.

## 2015-01-04 DIAGNOSIS — M85439 Solitary bone cyst, unspecified ulna and radius: Secondary | ICD-10-CM | POA: Diagnosis not present

## 2015-01-04 DIAGNOSIS — M25831 Other specified joint disorders, right wrist: Secondary | ICD-10-CM | POA: Diagnosis not present

## 2015-01-04 DIAGNOSIS — M24131 Other articular cartilage disorders, right wrist: Secondary | ICD-10-CM | POA: Diagnosis not present

## 2015-01-04 DIAGNOSIS — M109 Gout, unspecified: Secondary | ICD-10-CM | POA: Diagnosis not present

## 2015-01-09 ENCOUNTER — Ambulatory Visit (INDEPENDENT_AMBULATORY_CARE_PROVIDER_SITE_OTHER): Payer: Medicare Other | Admitting: Pharmacist

## 2015-01-09 VITALS — BP 112/60 | HR 68

## 2015-01-09 DIAGNOSIS — I2510 Atherosclerotic heart disease of native coronary artery without angina pectoris: Secondary | ICD-10-CM | POA: Diagnosis not present

## 2015-01-09 DIAGNOSIS — I10 Essential (primary) hypertension: Secondary | ICD-10-CM | POA: Diagnosis not present

## 2015-01-09 LAB — BASIC METABOLIC PANEL
BUN: 16 mg/dL (ref 6–23)
CALCIUM: 9.4 mg/dL (ref 8.4–10.5)
CO2: 27 meq/L (ref 19–32)
CREATININE: 1.36 mg/dL (ref 0.40–1.50)
Chloride: 102 mEq/L (ref 96–112)
GFR: 55.6 mL/min — AB (ref >60.00)
Glucose, Bld: 114 mg/dL — ABNORMAL HIGH (ref 70–99)
Potassium: 3.8 mEq/L (ref 3.5–5.1)
SODIUM: 134 meq/L — AB (ref 135–145)

## 2015-01-09 NOTE — Patient Instructions (Signed)
Your blood pressure is much better today.  Continue your current medications.   We will check your labs today to make sure your kidneys and potassium are okay with the new medication.   Follow up in 1 month

## 2015-01-10 ENCOUNTER — Ambulatory Visit (INDEPENDENT_AMBULATORY_CARE_PROVIDER_SITE_OTHER): Payer: Medicare Other | Admitting: Family

## 2015-01-10 ENCOUNTER — Encounter: Payer: Self-pay | Admitting: Family

## 2015-01-10 ENCOUNTER — Encounter: Payer: Self-pay | Admitting: Cardiology

## 2015-01-10 VITALS — BP 140/88 | HR 66 | Temp 97.8°F | Resp 16 | Ht 67.5 in | Wt 146.8 lb

## 2015-01-10 DIAGNOSIS — F418 Other specified anxiety disorders: Secondary | ICD-10-CM

## 2015-01-10 DIAGNOSIS — M109 Gout, unspecified: Secondary | ICD-10-CM | POA: Diagnosis not present

## 2015-01-10 DIAGNOSIS — I2510 Atherosclerotic heart disease of native coronary artery without angina pectoris: Secondary | ICD-10-CM

## 2015-01-10 DIAGNOSIS — I10 Essential (primary) hypertension: Secondary | ICD-10-CM

## 2015-01-10 DIAGNOSIS — Z8669 Personal history of other diseases of the nervous system and sense organs: Secondary | ICD-10-CM

## 2015-01-10 DIAGNOSIS — F32A Depression, unspecified: Secondary | ICD-10-CM

## 2015-01-10 DIAGNOSIS — F329 Major depressive disorder, single episode, unspecified: Secondary | ICD-10-CM

## 2015-01-10 DIAGNOSIS — F419 Anxiety disorder, unspecified: Principal | ICD-10-CM

## 2015-01-10 NOTE — Progress Notes (Signed)
Subjective:    Patient ID: [name], male    DOB: 11-01-1945, 69 y.o.   MRN: [id number]  HPI  Mr. Kluth is a 69 yr old male who presents today with two concerns:  1) Wrist pain/toe injury- reports that hs underwent an MRI of the brain as part of a research study at [facility]. He reports that he had to use a keyboard while in the MRI and as a result he injured his right wrist. He was then sent to [facility]. Reports that he  Is scheduled for ligament repair right wrist and wrist cyst on 7/6 with Dr. Ames Coupe. Expresses distrust of this orthopedic group a general "frustration" with medical care in [place]. Reports that he was upset that there is no communication between his physicians- specifically that "Dr. Radford Pax was not sent the lab work that was done in April.  Foot injury- reports that he broke his small toe on a treadmill. Has been wearing a boot and notes that this is feeling better.   3) HTN- last visit amlodipine was added.  BP Readings from Last 3 Encounters:  01/10/15 140/88  12/30/14 170/90  12/22/14 190/92   4) Migraine- reports that he was contacted by [facility] neuro and told that they did not need his prior brain scans sent prior to his appointment. This upset him and he has decided that he does not wish to schedule with their office.    5) Anxiety- reports that he continues to have daily anxiety and asks me to review our controlled substance policy. Reports that he continues daily marijuana use.    Review of Systems     Past Medical History  Diagnosis Date  . Arthritis   . Depression   . Hypertension   . Hyperlipidemia   . History of blood transfusion 1998    "due to cardiac bypass surgery"  . Migraine   . Hypogonadism in male   . BPH (benign prostatic hyperplasia)   . 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 Marital Status: Divorced    Spouse Name: N/A  . Number of Children: N/A  . Years of Education: N/A    Occupational History  . Not on file.   Social History Main Topics  . Smoking status: Former Smoker -- 20 years    Quit date: 07/22/1986  . Smokeless tobacco: Not on file  . Alcohol Use: Yes     Comment: 3-4 weekly  . Drug Use: No  . Sexual Activity: Not on file   Other Topics Concern  . Not on file   Social History Narrative   Divorced   Secondary school teacher- retired   No children   Has a Neurosurgeon named [name]   Enjoys outdoor activities- hiking, swimming, Control and instrumentation engineer, baseball, writing, reading, cooking    Past Surgical History  Procedure Laterality Date  . Varicose vein surgery  1984  . Coronary artery bypass graft  1998    Family History  Problem Relation Age of Onset  . Arthritis Mother     deceased  . Hyperlipidemia Mother   . Arthritis Father   . Hyperlipidemia Father   . Heart disease Father 67  . Stroke Father 18    deceased  . Hypertension Father   . Kidney disease Paternal Grandfather     Allergies  Allergen Reactions  . Mushroom Extract Complex     Severe vertigo, nausea, headache    Current Outpatient Prescriptions on  File Prior to Visit  Medication Sig Dispense Refill  . amLODipine (NORVASC) 10 MG tablet Take 1 tablet (10 mg total) by mouth daily. 30 tablet 11  . Ascorbic Acid (VITAMIN C) 1000 MG tablet Take 1,000 mg by mouth 2 (two) times daily.    Marland Kitchen aspirin EC 81 MG tablet Take 81 mg by mouth daily.    Marland Kitchen losartan (COZAAR) 25 MG tablet Take 1 tablet (25 mg total) by mouth daily. 30 tablet 6  . metoprolol succinate (TOPROL-XL) 100 MG 24 hr tablet Take 1 tablet (100 mg total) by mouth daily. Take with or immediately following a meal. 30 tablet 0  . Multiple Vitamins-Minerals (CENTRUM ADULTS PO) Take 1 tablet by mouth daily.    . niacin 500 MG tablet Take 500 mg by mouth at bedtime.    . pravastatin (PRAVACHOL) 20 MG tablet TAKE 1 TABLET BY MOUTH EVERY DAY 30 tablet 5  . tadalafil (CIALIS) 5 MG tablet Take 5 mg by mouth daily as needed for erectile  dysfunction.    . Testosterone (FORTESTA TD) Place 2 Act onto the skin daily. 1.62%    . traZODone (DESYREL) 100 MG tablet TAKE 1 TABLET BY MOUTH NIGHTLY AT BEDTIME 30 tablet 2  . vitamin E 400 UNIT capsule Take 400 Units by mouth daily.     No current facility-administered medications on file prior to visit.    BP 140/88 mmHg  Pulse 66  Temp(Src) 97.8 F (36.6 C) (Oral)  Resp 16  Ht 5' 7.5" (1.715 m)  Wt 146 lb 12.8 oz (66.588 kg)  BMI 22.64 kg/m2  SpO2 97%    Objective:   Physical Exam  Constitutional: He is oriented to person, place, and time. He appears well-developed and well-nourished. No distress.  HENT:  Head: Normocephalic and atraumatic.  Pulmonary/Chest: Breath sounds normal.  Musculoskeletal: He exhibits no edema.  Some swelling noted right wrist  Neurological: He is alert and oriented to person, place, and time.  Skin: Skin is warm and dry.  Psychiatric: He has a normal mood and affect. His behavior is normal. Thought content normal.          Assessment & Plan:  A referral to another orthopedic practice was offered to the patient but he declined.   I did explain to patient that lab work is available for review to all [facility] providers as we share the same EMR.

## 2015-01-10 NOTE — Progress Notes (Signed)
Pre visit review using our clinic review tool, if applicable. No additional management support is needed unless otherwise documented below in the visit note. 

## 2015-01-10 NOTE — Assessment & Plan Note (Signed)
Pt declines referral to another neurology group.

## 2015-01-10 NOTE — Assessment & Plan Note (Addendum)
Anxiety continues to be an issue for the patient. He would like to resume benzo's but I told the patient that in order to prescribe this medication for him, that he would need to complete a urine drug screen and that he would need to test drug free before we could initiate medication.  Pt became frustrated and stated, "I've had it, I am done here, this is it, I am just going to have to move back out [place]!" Pt proceeded to walk out of the room.

## 2015-01-10 NOTE — Assessment & Plan Note (Signed)
BP looks better with addition of amlodipine, continue same.

## 2015-01-12 ENCOUNTER — Other Ambulatory Visit: Payer: Self-pay

## 2015-01-12 ENCOUNTER — Other Ambulatory Visit (HOSPITAL_COMMUNITY)
Admission: RE | Admit: 2015-01-12 | Discharge: 2015-01-12 | Disposition: A | Discharge status: Home / Self Care | Payer: Medicare Other | Source: Ambulatory Visit | Attending: Cardiology | Admitting: Cardiology

## 2015-01-12 ENCOUNTER — Telehealth: Payer: Self-pay

## 2015-01-12 ENCOUNTER — Ambulatory Visit (HOSPITAL_COMMUNITY): Payer: Medicare Other

## 2015-01-12 DIAGNOSIS — Z01812 Encounter for preprocedural laboratory examination: Secondary | ICD-10-CM

## 2015-01-12 DIAGNOSIS — I359 Nonrheumatic aortic valve disorder, unspecified: Secondary | ICD-10-CM

## 2015-01-12 DIAGNOSIS — Z029 Encounter for administrative examinations, unspecified: Secondary | ICD-10-CM | POA: Insufficient documentation

## 2015-01-12 MED ORDER — METOPROLOL SUCCINATE ER 100 MG PO TB24: 100.0000 mg | ORAL_TABLET | Freq: Every day | ORAL | Status: DC

## 2015-01-12 NOTE — Telephone Encounter (Signed)
-----   Message from Sueanne Margarita, MD sent at 12/31/2014  3:46 PM EDT ----- Normal LVF with mildly thickened heart muscle, possible bicuspid AV with mildly to moderately leaky AV, mildly dilated aorta, thickened MV - patient needs to be set up for TEE by me to further evaluate his AV

## 2015-01-12 NOTE — Telephone Encounter (Signed)
Spoke with patient in great detail about his ECHO results. He st he overreacted with his earlier [employer].  TEE scheduled with Dr. Radford Pax next Thursday, June 30. Patient to pick up instruction letter when he comes for lab work June 28. Patient agrees with treatment plan.

## 2015-01-13 LAB — TESTOSTERONE: Testosterone: 240 ng/dL — ABNORMAL LOW (ref 348–1197)

## 2015-01-16 DIAGNOSIS — E291 Testicular hypofunction: Secondary | ICD-10-CM | POA: Diagnosis not present

## 2015-01-16 DIAGNOSIS — N401 Enlarged prostate with lower urinary tract symptoms: Secondary | ICD-10-CM | POA: Diagnosis not present

## 2015-01-16 DIAGNOSIS — N508 Other specified disorders of male genital organs: Secondary | ICD-10-CM | POA: Diagnosis not present

## 2015-01-17 ENCOUNTER — Other Ambulatory Visit (INDEPENDENT_AMBULATORY_CARE_PROVIDER_SITE_OTHER): Payer: Medicare Other

## 2015-01-17 DIAGNOSIS — I359 Nonrheumatic aortic valve disorder, unspecified: Secondary | ICD-10-CM | POA: Diagnosis not present

## 2015-01-17 DIAGNOSIS — Z01812 Encounter for preprocedural laboratory examination: Secondary | ICD-10-CM | POA: Diagnosis not present

## 2015-01-18 ENCOUNTER — Other Ambulatory Visit: Payer: Self-pay | Admitting: Cardiology

## 2015-01-18 DIAGNOSIS — I359 Nonrheumatic aortic valve disorder, unspecified: Secondary | ICD-10-CM

## 2015-01-18 LAB — CBC WITH DIFFERENTIAL/PLATELET
BASOS PCT: 0.3 % (ref 0.0–3.0)
Basophils Absolute: 0 10*3/uL (ref 0.0–0.1)
Eosinophils Absolute: 0.2 10*3/uL (ref 0.0–0.7)
Eosinophils Relative: 1.8 % (ref 0.0–5.0)
HCT: 39.8 % (ref 39.0–52.0)
HEMOGLOBIN: 13.4 g/dL (ref 13.0–17.0)
LYMPHS PCT: 22.8 % (ref 12.0–46.0)
Lymphs Abs: 2.2 10*3/uL (ref 0.7–4.0)
MCHC: 33.7 g/dL (ref 30.0–36.0)
MCV: 101 fl — ABNORMAL HIGH (ref 78.0–100.0)
Monocytes Absolute: 0.5 10*3/uL (ref 0.1–1.0)
Monocytes Relative: 5.1 % (ref 3.0–12.0)
NEUTROS ABS: 6.9 10*3/uL (ref 1.4–7.7)
Neutrophils Relative %: 70 % (ref 43.0–77.0)
Platelets: 326 10*3/uL (ref 150.0–400.0)
RBC: 3.94 Mil/uL — AB (ref 4.22–5.81)
RDW: 13.1 % (ref 11.5–15.5)
WBC: 9.9 10*3/uL (ref 4.0–10.5)

## 2015-01-18 NOTE — Progress Notes (Signed)
Chief Complaint  Patient presents with  . Hypertension      History of Present Illness: [name] is a 69 y.o. male  Patient of Dr. Radford Pax who was referred to the Hypertension Clinic.  He just recently moved to the area and established with the practice.   He has a history of CAD with CABG in 1998 after presenting with USAP.   He has mild carotid artery stenosis on dopplers.  He has a history of dyslipidemia.  He has a history of mild renal insufficiency felt secondary to hypertensive nephrosclerosis.  His BP at his visit with Dr. Radford Pax on 6/2 was 190/92.  Pt was taking amlodipine 5mg  daily at that time. She increased this to 10mg  daily. He came back to check his BP on 6/10 and it was still elevated at 170/90.  Losartan 25mg  daily was added to the amlodpine with plans to recheck in 1 week.  Pt is very frustrated with having to add several new medications.  He states he only had to take 1 BP medication in the past and he thinks lifestyle issues are contributing to his HTN.  He has stress related to his family and mother dying recently, he has a wrist and toe injury that is requiring pain medications and limits his current exercise routine.    Current BP medications: amlodipine 10mg  daily and losartan 25mg  daily  Intolerances: none BP goal <140/80  FH: father died at 58 due to stroke, HTN.  Mother had HTN   Filed Vitals:   01/09/15 1627  BP: 112/60  Pulse: 68     Past Medical History  Diagnosis Date  . Arthritis   . Depression   . Hypertension   . Hyperlipidemia   . History of blood transfusion 1998    "due to cardiac bypass surgery"  . Migraine   . Hypogonadism in male   . BPH (benign prostatic hyperplasia)   . Retinal hemorrhage 11/04/2014     Current Outpatient Prescriptions  Medication Sig Dispense Refill  . aspirin EC 81 MG tablet Take 81 mg by mouth daily.    Marland Kitchen amLODipine (NORVASC) 10 MG tablet Take 1 tablet (10 mg total) by mouth daily. 30  tablet 11  . Ascorbic Acid (VITAMIN C) 1000 MG tablet Take 1,000 mg by mouth 2 (two) times daily.    Marland Kitchen losartan (COZAAR) 25 MG tablet Take 1 tablet (25 mg total) by mouth daily. 30 tablet 6  . metoprolol succinate (TOPROL-XL) 100 MG 24 hr tablet Take 1 tablet (100 mg total) by mouth daily. Take with or immediately following a meal. 30 tablet 1  . Multiple Vitamins-Minerals (CENTRUM ADULTS PO) Take 1 tablet by mouth daily.    . niacin 500 MG tablet Take 500 mg by mouth at bedtime.    . pravastatin (PRAVACHOL) 20 MG tablet TAKE 1 TABLET BY MOUTH EVERY DAY 30 tablet 5  . tadalafil (CIALIS) 5 MG tablet Take 5 mg by mouth daily as needed for erectile dysfunction.    . Testosterone (FORTESTA TD) Place 2 Act onto the skin daily. 1.62%    . traZODone (DESYREL) 100 MG tablet TAKE 1 TABLET BY MOUTH NIGHTLY AT BEDTIME 30 tablet 2  . vitamin E 400 UNIT capsule Take 400 Units by mouth daily.     No current facility-administered medications for this visit.    Allergies:   Mushroom extract complex  Social History:  The patient  reports that he quit smoking about 28 years ago. He does not have any smokeless tobacco history on file. He reports that he drinks alcohol. He reports that he does not use illicit drugs.     ASSESSMENT AND PLAN:  1.  HTN- BP improved to goal with the addition of losartan.  Will continue current therapy.  We have not checked a BMET since starting ARB so will check today.  Pt has an appt with his PCP tomorrow.  Suggested he speak to her about his anxiety and pain as these may be contributing to his need for additional blood pressure medications.  We will see him again 1 month to ensure BP still at goal and see if any ability to de-escalate therapy.    Cyndee Brightly [facility]  01/18/2015 4:28 PM    [facility] [place], [place], [place]  [address] Phone: [phone]; Fax: [phone]

## 2015-01-19 ENCOUNTER — Encounter (HOSPITAL_COMMUNITY)
Admission: RE | Disposition: A | Discharge status: Home / Self Care | Payer: Self-pay | Source: Ambulatory Visit | Attending: Cardiology

## 2015-01-19 ENCOUNTER — Telehealth: Payer: Self-pay | Admitting: Cardiology

## 2015-01-19 ENCOUNTER — Ambulatory Visit (HOSPITAL_COMMUNITY)
Admission: RE | Admit: 2015-01-19 | Discharge: 2015-01-19 | Disposition: A | Discharge status: Home / Self Care | Payer: Medicare Other | Source: Ambulatory Visit | Attending: Cardiology | Admitting: Cardiology

## 2015-01-19 ENCOUNTER — Encounter (HOSPITAL_COMMUNITY): Payer: Self-pay | Admitting: *Deleted

## 2015-01-19 DIAGNOSIS — I7121 Aneurysm of the ascending aorta, without rupture: Secondary | ICD-10-CM

## 2015-01-19 DIAGNOSIS — I712 Thoracic aortic aneurysm, without rupture, unspecified: Secondary | ICD-10-CM | POA: Diagnosis present

## 2015-01-19 DIAGNOSIS — I351 Nonrheumatic aortic (valve) insufficiency: Secondary | ICD-10-CM | POA: Diagnosis not present

## 2015-01-19 DIAGNOSIS — I77810 Thoracic aortic ectasia: Secondary | ICD-10-CM | POA: Insufficient documentation

## 2015-01-19 DIAGNOSIS — I359 Nonrheumatic aortic valve disorder, unspecified: Secondary | ICD-10-CM

## 2015-01-19 HISTORY — PX: TEE WITHOUT CARDIOVERSION: SHX5443

## 2015-01-19 SURGERY — ECHOCARDIOGRAM, TRANSESOPHAGEAL
Anesthesia: Moderate Sedation

## 2015-01-19 MED ORDER — DIPHENHYDRAMINE HCL 50 MG/ML IJ SOLN
INTRAMUSCULAR | Status: AC
  Filled 2015-01-19: qty 1

## 2015-01-19 MED ORDER — SODIUM CHLORIDE 0.9 % IV SOLN: INTRAVENOUS | Status: DC

## 2015-01-19 MED ORDER — LIDOCAINE VISCOUS 2 % MT SOLN
OROMUCOSAL | Status: AC
  Filled 2015-01-19: qty 15

## 2015-01-19 MED ORDER — FENTANYL CITRATE (PF) 100 MCG/2ML IJ SOLN
INTRAMUSCULAR | Status: DC | PRN
  Administered 2015-01-19 (×2): 25 ug via INTRAVENOUS

## 2015-01-19 MED ORDER — MIDAZOLAM HCL 5 MG/ML IJ SOLN
INTRAMUSCULAR | Status: AC
  Filled 2015-01-19: qty 2

## 2015-01-19 MED ORDER — FENTANYL CITRATE (PF) 100 MCG/2ML IJ SOLN
INTRAMUSCULAR | Status: AC
  Filled 2015-01-19: qty 2

## 2015-01-19 MED ORDER — MIDAZOLAM HCL 10 MG/2ML IJ SOLN
INTRAMUSCULAR | Status: DC | PRN
  Administered 2015-01-19 (×2): 2 mg via INTRAVENOUS

## 2015-01-19 MED ORDER — LIDOCAINE VISCOUS 2 % MT SOLN
OROMUCOSAL | Status: DC | PRN
  Administered 2015-01-19: 10 mL via OROMUCOSAL

## 2015-01-19 NOTE — Interval H&P Note (Signed)
History and Physical Interval Note:  01/19/2015 10:13 AM  [name]  has presented today for surgery, with the diagnosis of moderate leaky aortic valve  The various methods of treatment have been discussed with the patient and family. After consideration of risks, benefits and other options for treatment, the patient has consented to  Procedure(s): TRANSESOPHAGEAL ECHOCARDIOGRAM (TEE) (N/A) as a surgical intervention .  The patient's history has been reviewed, patient examined, no change in status, stable for surgery.  I have reviewed the patient's chart and labs.  Questions were answered to the patient's satisfaction.     Maximilian Tallo R

## 2015-01-19 NOTE — Progress Notes (Signed)
Echocardiogram Echocardiogram Transesophageal has been performed.  [name] 01/19/2015, 11:16 AM

## 2015-01-19 NOTE — Telephone Encounter (Signed)
Referral for Dr. Cyndia Bent placed for scheduling.

## 2015-01-19 NOTE — Discharge Instructions (Signed)
Transesophageal Echocardiogram °Transesophageal echocardiography (TEE) is a special type of test that produces images of the heart by using sound waves (echocardiogram). This type of echocardiography can obtain better images of the heart than standard echocardiography. TEE is done by passing a flexible tube down the esophagus. The heart is located in front of the esophagus. Because the heart and esophagus are close to one another, your health care provider can take very clear, detailed pictures of the heart via ultrasound waves. °TEE may be done: °· If your health care provider needs more information based on standard echocardiography findings. °· If you had a stroke. This might have happened because a clot formed in your heart. TEE can visualize different areas of the heart and check for clots. °· To check valve anatomy and function. °· To check for infection on the inside of your heart (endocarditis). °· To evaluate the dividing wall (septum) of the heart and presence of a hole that did not close after birth (patent foramen ovale or atrial septal defect). °· To help diagnose a tear in the wall of the aorta (aortic dissection). °· During cardiac valve surgery. This allows the surgeon to assess the valve repair before closing the chest. °· During a variety of other cardiac procedures to guide positioning of catheters. °· Sometimes before a cardioversion, which is a shock to convert heart rhythm back to normal. °LET YOUR HEALTH CARE PROVIDER KNOW ABOUT:  °· Any allergies you have. °· All medicines you are taking, including vitamins, herbs, eye drops, creams, and over-the-counter medicines. °· Previous problems you or members of your family have had with the use of anesthetics. °· Any blood disorders you have. °· Previous surgeries you have had. °· Medical conditions you have. °· Swallowing difficulties. °· An esophageal obstruction. °RISKS AND COMPLICATIONS  °Generally, TEE is a safe procedure. However, as with any  procedure, complications can occur. Possible complications include an esophageal tear (rupture). °BEFORE THE PROCEDURE  °· Do not eat or drink for 6 hours before the procedure or as directed by your health care provider. °· Arrange for someone to drive you home after the procedure. Do not drive yourself home. During the procedure, you will be given medicines that can continue to make you feel drowsy and can impair your reflexes. °· An IV access tube will be started in the arm. °PROCEDURE  °· A medicine to help you relax (sedative) will be given through the IV access tube. °· A medicine may be sprayed or gargled to numb the back of the throat. °· Your blood pressure, heart rate, and breathing (vital signs) will be monitored during the procedure. °· The TEE probe is a long, flexible tube. The tip of the probe is placed into the back of the mouth, and you will be asked to swallow. This helps to pass the tip of the probe into the esophagus. Once the tip of the probe is in the correct area, your health care provider can take pictures of the heart. °· TEE is usually not a painful procedure. You may feel the probe press against the back of the throat. The probe does not enter the trachea and does not affect your breathing. °AFTER THE PROCEDURE  °· You will be in bed, resting, until you have fully returned to consciousness. °· When you first awaken, your throat may feel slightly sore and will probably still feel numb. This will improve slowly over time. °· You will not be allowed to eat or drink until it   is clear that the numbness has improved. °· Once you have been able to drink, urinate, and sit on the edge of the bed without feeling sick to your stomach (nausea) or dizzy, you may be cleared to go home. °· You should have a friend or family member with you for the next 24 hours after your procedure. °Document Released: 09/28/2002 Document Revised: 07/13/2013 Document Reviewed: 01/07/2013 °[employer]® Patient Information  ©2015 [employer]. This information is not intended to replace advice given to you by your health care provider. Make sure you discuss any questions you have with your health care provider. ° °

## 2015-01-19 NOTE — H&P (View-Only) (Signed)
Add Losartan 25mg  daily and come back to pharmacy clinic for BP check in 1 week

## 2015-01-19 NOTE — Telephone Encounter (Signed)
Patient had moderate to severe AI with moderate ascending thoracic aortic aneurysm - please refer to Dr. Cyndia Bent for further evaluation.

## 2015-01-19 NOTE — Addendum Note (Signed)
Addended by: Harland German A on: 01/19/2015 02:10 PM   Modules accepted: Orders

## 2015-01-19 NOTE — CV Procedure (Signed)
PROCEDURE NOTE  Procedure:  Transesophageal echocardiogram Operator:  Fransico Him, MD Indications:  AI with dilated aortic root and possible bicuspid AV Complications:None IV Meds:Versed 4 mg and Fentanyl 50 mcg  Results: Normal LV size and function EF 60% Normal RV size and function Mildly dilated RA Normal LA and LA appendage Normal MV with mild MR Normal PV with trivial PR Normal TV with mild TR Trileaflet aortic valve with dilated sinuses of Valsalva measuring 4.8cm in diameter.  There is moderate to severe aortic insufficiency somewhat eccentric due to leaflet malcoaptation from annular dilatation.  The ascending aorta is dilated up to 4.6cm.   Normal interatrial septum with no evidence of shunt by colorflow doppler  The patient tolerated the procedure well and was transferred back to his room in stable condition.  Fransico Him, MD [facility] 01/19/2015

## 2015-01-20 ENCOUNTER — Encounter (HOSPITAL_COMMUNITY): Payer: Self-pay | Admitting: Cardiology

## 2015-01-20 ENCOUNTER — Encounter: Payer: Self-pay | Admitting: Cardiology

## 2015-01-20 DIAGNOSIS — M8560 Other cyst of bone, unspecified site: Secondary | ICD-10-CM | POA: Diagnosis not present

## 2015-01-20 DIAGNOSIS — M65841 Other synovitis and tenosynovitis, right hand: Secondary | ICD-10-CM | POA: Diagnosis not present

## 2015-01-25 ENCOUNTER — Other Ambulatory Visit: Payer: Self-pay | Admitting: Family

## 2015-01-25 NOTE — Telephone Encounter (Signed)
Denial sent to pharmacy with note that auto refill will not be given and to send request when pt requests next refill.

## 2015-01-25 NOTE — Telephone Encounter (Signed)
Too soon to refill.

## 2015-01-25 NOTE — Telephone Encounter (Signed)
[name]-- please advise future Trazodone refills? Last Rx 11/23/14, #30 x 2 refills. This is an auto request from pt's pharmacy.

## 2015-02-03 ENCOUNTER — Telehealth: Payer: Self-pay | Admitting: *Deleted

## 2015-02-07 ENCOUNTER — Encounter: Payer: Self-pay | Admitting: Pharmacist

## 2015-02-07 ENCOUNTER — Ambulatory Visit (INDEPENDENT_AMBULATORY_CARE_PROVIDER_SITE_OTHER): Payer: Medicare Other | Admitting: Pharmacist

## 2015-02-07 VITALS — BP 126/78

## 2015-02-07 DIAGNOSIS — I10 Essential (primary) hypertension: Secondary | ICD-10-CM

## 2015-02-07 DIAGNOSIS — I2510 Atherosclerotic heart disease of native coronary artery without angina pectoris: Secondary | ICD-10-CM

## 2015-02-07 NOTE — Progress Notes (Signed)
HPI: [name] is a 69 yo male patient of Dr. Radford Pax who returns to HTN clinic for a 1 month f/u visit. PMH is significant for CAD with CABG in 1998, UA, mild carotid artery stenosis, mild renal insufficiency, and HLD. Patient states that he has had a lot of pain and anxiety and has recently switched his PCP. He is scheduled to have surgery next week on his wrist and hand, and also recently broke his toe.  Prior blood pressure readings: 12/22/14      190/92        on amlodipine 5mg  daily 12/30/14    170/90        on amlodipine 10mg  daily 01/09/15    112/60        on amlodipine 10mg  daily + losartan 25mg  daily   Current BP meds: amlodipine 10mg  daily and losartan 25mg  daily Intolerances: none BP goal: < 140/90  BP today: 126/78 on amlodipine 10mg  daily and losartan 25mg  daily. Patient reports tolerating medications well, no issues picking them up from the pharmacy, no orthostatic hypotension, does not check his BP at home (does not have a cuff). States that he is surprised his BP isn't higher since he is stressed out and still has pain in his hand and toe.  FH: father diet at 53 due to stroke, HTN. Mother had HTN SH: patient reports that he quit smoking 28 years ago. No smokeless tobacco history on file. Reports that he drinks alcohol. Reports that he does not use illicit drugs.  Allergies: mushroom extract complex  Past Medical History  Diagnosis Date  . Arthritis   . Depression   . Hypertension   . Hyperlipidemia   . History of blood transfusion 1998    "due to cardiac bypass surgery"  . Migraine   . Hypogonadism in male   . BPH (benign prostatic hyperplasia)   . Retinal hemorrhage 11/04/2014    Current Outpatient Prescriptions on File Prior to Visit  Medication Sig Dispense Refill  . amLODipine (NORVASC) 10 MG tablet Take 1 tablet (10 mg total) by mouth daily. 30 tablet 11  . Ascorbic Acid (VITAMIN C) 1000 MG tablet Take 1,000 mg by mouth 2 (two) times daily.    Marland Kitchen aspirin EC 81  MG tablet Take 81 mg by mouth daily.    Marland Kitchen losartan (COZAAR) 25 MG tablet Take 1 tablet (25 mg total) by mouth daily. 30 tablet 6  . metoprolol succinate (TOPROL-XL) 100 MG 24 hr tablet Take 1 tablet (100 mg total) by mouth daily. Take with or immediately following a meal. 30 tablet 1  . Multiple Vitamins-Minerals (CENTRUM ADULTS PO) Take 1 tablet by mouth daily.    . niacin 500 MG tablet Take 500 mg by mouth at bedtime.    . pravastatin (PRAVACHOL) 20 MG tablet TAKE 1 TABLET BY MOUTH EVERY DAY 30 tablet 5  . tadalafil (CIALIS) 5 MG tablet Take 5 mg by mouth daily as needed for erectile dysfunction.    . Testosterone (FORTESTA TD) Place 2 Act onto the skin daily. 1.62%    . traZODone (DESYREL) 100 MG tablet TAKE 1 TABLET BY MOUTH NIGHTLY AT BEDTIME 30 tablet 2  . vitamin E 400 UNIT capsule Take 400 Units by mouth daily.     No current facility-administered medications on file prior to visit.

## 2015-02-07 NOTE — Assessment & Plan Note (Signed)
A/P: Patient with improved BP now at goal < 140/90 on amlodipine 10mg  daily and losartan 25mg  daily. SCr and K both stable since recent addition of losartan in June. Will continue current therapy. Patient denies any orthostatis and is tolerating his BP medications well. Exercise goal set by patient to start running on the treadmill again after his broken toe heals. Given BP control at last 2 visits, pharmacy signing off of HTN management and patient's new PCP will follow.  Toshika Parrow E. Shatera Rennert, PharmD [facility] [address], [place], [place] [address] Phone: [phone]; Fax: [phone]  02/07/2015 10:04 AM

## 2015-02-08 ENCOUNTER — Encounter: Payer: Medicare Other | Admitting: Surgery

## 2015-02-08 DIAGNOSIS — Z79899 Other long term (current) drug therapy: Secondary | ICD-10-CM | POA: Diagnosis not present

## 2015-02-08 DIAGNOSIS — E291 Testicular hypofunction: Secondary | ICD-10-CM | POA: Diagnosis not present

## 2015-02-09 DIAGNOSIS — F419 Anxiety disorder, unspecified: Secondary | ICD-10-CM | POA: Diagnosis not present

## 2015-02-09 DIAGNOSIS — I2510 Atherosclerotic heart disease of native coronary artery without angina pectoris: Secondary | ICD-10-CM | POA: Diagnosis not present

## 2015-02-09 DIAGNOSIS — I10 Essential (primary) hypertension: Secondary | ICD-10-CM | POA: Diagnosis not present

## 2015-02-09 DIAGNOSIS — Z951 Presence of aortocoronary bypass graft: Secondary | ICD-10-CM | POA: Diagnosis not present

## 2015-02-09 DIAGNOSIS — Z01818 Encounter for other preprocedural examination: Secondary | ICD-10-CM | POA: Diagnosis not present

## 2015-02-09 DIAGNOSIS — R001 Bradycardia, unspecified: Secondary | ICD-10-CM | POA: Diagnosis not present

## 2015-02-09 DIAGNOSIS — M6588 Other synovitis and tenosynovitis, other site: Secondary | ICD-10-CM | POA: Diagnosis not present

## 2015-02-09 DIAGNOSIS — R51 Headache: Secondary | ICD-10-CM | POA: Diagnosis not present

## 2015-02-16 ENCOUNTER — Encounter: Payer: Self-pay | Admitting: Cardiology

## 2015-02-16 DIAGNOSIS — Z951 Presence of aortocoronary bypass graft: Secondary | ICD-10-CM | POA: Diagnosis not present

## 2015-02-16 DIAGNOSIS — M6588 Other synovitis and tenosynovitis, other site: Secondary | ICD-10-CM | POA: Diagnosis not present

## 2015-02-16 DIAGNOSIS — M25531 Pain in right wrist: Secondary | ICD-10-CM | POA: Diagnosis not present

## 2015-02-16 DIAGNOSIS — M65831 Other synovitis and tenosynovitis, right forearm: Secondary | ICD-10-CM | POA: Diagnosis not present

## 2015-02-16 DIAGNOSIS — I10 Essential (primary) hypertension: Secondary | ICD-10-CM | POA: Diagnosis not present

## 2015-02-16 DIAGNOSIS — F419 Anxiety disorder, unspecified: Secondary | ICD-10-CM | POA: Diagnosis not present

## 2015-02-16 DIAGNOSIS — M65841 Other synovitis and tenosynovitis, right hand: Secondary | ICD-10-CM | POA: Diagnosis not present

## 2015-02-16 DIAGNOSIS — I2510 Atherosclerotic heart disease of native coronary artery without angina pectoris: Secondary | ICD-10-CM | POA: Diagnosis not present

## 2015-02-16 DIAGNOSIS — R51 Headache: Secondary | ICD-10-CM | POA: Diagnosis not present

## 2015-02-16 DIAGNOSIS — M659 Synovitis and tenosynovitis, unspecified: Secondary | ICD-10-CM | POA: Diagnosis not present

## 2015-02-21 DIAGNOSIS — S92514A Nondisplaced fracture of proximal phalanx of right lesser toe(s), initial encounter for closed fracture: Secondary | ICD-10-CM | POA: Diagnosis not present

## 2015-02-21 DIAGNOSIS — M898X7 Other specified disorders of bone, ankle and foot: Secondary | ICD-10-CM | POA: Diagnosis not present

## 2015-02-21 DIAGNOSIS — S92514D Nondisplaced fracture of proximal phalanx of right lesser toe(s), subsequent encounter for fracture with routine healing: Secondary | ICD-10-CM | POA: Diagnosis not present

## 2015-02-21 DIAGNOSIS — M19072 Primary osteoarthritis, left ankle and foot: Secondary | ICD-10-CM | POA: Diagnosis not present

## 2015-02-21 DIAGNOSIS — M65841 Other synovitis and tenosynovitis, right hand: Secondary | ICD-10-CM | POA: Diagnosis not present

## 2015-02-21 DIAGNOSIS — Z961 Presence of intraocular lens: Secondary | ICD-10-CM | POA: Diagnosis not present

## 2015-02-21 DIAGNOSIS — Z87891 Personal history of nicotine dependence: Secondary | ICD-10-CM | POA: Diagnosis not present

## 2015-02-21 DIAGNOSIS — H35073 Retinal telangiectasis, bilateral: Secondary | ICD-10-CM | POA: Diagnosis not present

## 2015-02-21 DIAGNOSIS — M1990 Unspecified osteoarthritis, unspecified site: Secondary | ICD-10-CM | POA: Diagnosis not present

## 2015-02-21 DIAGNOSIS — M7989 Other specified soft tissue disorders: Secondary | ICD-10-CM | POA: Diagnosis not present

## 2015-02-21 DIAGNOSIS — I708 Atherosclerosis of other arteries: Secondary | ICD-10-CM | POA: Diagnosis not present

## 2015-02-21 DIAGNOSIS — M899 Disorder of bone, unspecified: Secondary | ICD-10-CM | POA: Diagnosis not present

## 2015-02-21 DIAGNOSIS — M19071 Primary osteoarthritis, right ankle and foot: Secondary | ICD-10-CM | POA: Diagnosis not present

## 2015-02-21 DIAGNOSIS — H02054 Trichiasis without entropian left upper eyelid: Secondary | ICD-10-CM | POA: Diagnosis not present

## 2015-02-27 ENCOUNTER — Other Ambulatory Visit: Payer: Self-pay | Admitting: Family

## 2015-02-28 DIAGNOSIS — F411 Generalized anxiety disorder: Secondary | ICD-10-CM | POA: Diagnosis not present

## 2015-03-02 DIAGNOSIS — Z4889 Encounter for other specified surgical aftercare: Secondary | ICD-10-CM | POA: Diagnosis not present

## 2015-03-02 DIAGNOSIS — M65841 Other synovitis and tenosynovitis, right hand: Secondary | ICD-10-CM | POA: Diagnosis not present

## 2015-03-02 DIAGNOSIS — M25531 Pain in right wrist: Secondary | ICD-10-CM | POA: Diagnosis not present

## 2015-03-08 DIAGNOSIS — F411 Generalized anxiety disorder: Secondary | ICD-10-CM | POA: Diagnosis not present

## 2015-03-13 DIAGNOSIS — M25531 Pain in right wrist: Secondary | ICD-10-CM | POA: Diagnosis not present

## 2015-03-13 DIAGNOSIS — M25431 Effusion, right wrist: Secondary | ICD-10-CM | POA: Diagnosis not present

## 2015-03-14 DIAGNOSIS — F411 Generalized anxiety disorder: Secondary | ICD-10-CM | POA: Diagnosis not present

## 2015-03-16 ENCOUNTER — Other Ambulatory Visit: Payer: Self-pay | Admitting: Cardiology

## 2015-03-16 DIAGNOSIS — M25431 Effusion, right wrist: Secondary | ICD-10-CM | POA: Diagnosis not present

## 2015-03-16 DIAGNOSIS — M25531 Pain in right wrist: Secondary | ICD-10-CM | POA: Diagnosis not present

## 2015-03-20 DIAGNOSIS — F411 Generalized anxiety disorder: Secondary | ICD-10-CM | POA: Diagnosis not present

## 2015-03-21 DIAGNOSIS — M25531 Pain in right wrist: Secondary | ICD-10-CM | POA: Diagnosis not present

## 2015-03-21 DIAGNOSIS — M25431 Effusion, right wrist: Secondary | ICD-10-CM | POA: Diagnosis not present

## 2015-03-23 DIAGNOSIS — M25431 Effusion, right wrist: Secondary | ICD-10-CM | POA: Diagnosis not present

## 2015-03-23 DIAGNOSIS — M25531 Pain in right wrist: Secondary | ICD-10-CM | POA: Diagnosis not present

## 2015-03-29 DIAGNOSIS — M65841 Other synovitis and tenosynovitis, right hand: Secondary | ICD-10-CM | POA: Diagnosis not present

## 2015-03-29 DIAGNOSIS — M25531 Pain in right wrist: Secondary | ICD-10-CM | POA: Diagnosis not present

## 2015-03-29 DIAGNOSIS — Z4789 Encounter for other orthopedic aftercare: Secondary | ICD-10-CM | POA: Diagnosis not present

## 2015-03-29 DIAGNOSIS — M25431 Effusion, right wrist: Secondary | ICD-10-CM | POA: Diagnosis not present

## 2015-03-29 DIAGNOSIS — M13831 Other specified arthritis, right wrist: Secondary | ICD-10-CM | POA: Diagnosis not present

## 2015-03-30 DIAGNOSIS — F411 Generalized anxiety disorder: Secondary | ICD-10-CM | POA: Diagnosis not present

## 2015-03-31 DIAGNOSIS — M25531 Pain in right wrist: Secondary | ICD-10-CM | POA: Diagnosis not present

## 2015-03-31 DIAGNOSIS — M25431 Effusion, right wrist: Secondary | ICD-10-CM | POA: Diagnosis not present

## 2015-04-04 DIAGNOSIS — M25531 Pain in right wrist: Secondary | ICD-10-CM | POA: Diagnosis not present

## 2015-04-04 DIAGNOSIS — M25431 Effusion, right wrist: Secondary | ICD-10-CM | POA: Diagnosis not present

## 2015-04-06 DIAGNOSIS — M25531 Pain in right wrist: Secondary | ICD-10-CM | POA: Diagnosis not present

## 2015-04-06 DIAGNOSIS — F411 Generalized anxiety disorder: Secondary | ICD-10-CM | POA: Diagnosis not present

## 2015-04-06 DIAGNOSIS — M25431 Effusion, right wrist: Secondary | ICD-10-CM | POA: Diagnosis not present

## 2015-04-07 DIAGNOSIS — M25561 Pain in right knee: Secondary | ICD-10-CM | POA: Diagnosis not present

## 2015-04-11 DIAGNOSIS — M25531 Pain in right wrist: Secondary | ICD-10-CM | POA: Diagnosis not present

## 2015-04-11 DIAGNOSIS — M25431 Effusion, right wrist: Secondary | ICD-10-CM | POA: Diagnosis not present

## 2015-04-12 DIAGNOSIS — F411 Generalized anxiety disorder: Secondary | ICD-10-CM | POA: Diagnosis not present

## 2015-04-13 DIAGNOSIS — M25531 Pain in right wrist: Secondary | ICD-10-CM | POA: Diagnosis not present

## 2015-04-13 DIAGNOSIS — M25431 Effusion, right wrist: Secondary | ICD-10-CM | POA: Diagnosis not present

## 2015-04-18 DIAGNOSIS — M25431 Effusion, right wrist: Secondary | ICD-10-CM | POA: Diagnosis not present

## 2015-04-18 DIAGNOSIS — M25531 Pain in right wrist: Secondary | ICD-10-CM | POA: Diagnosis not present

## 2015-04-18 DIAGNOSIS — F411 Generalized anxiety disorder: Secondary | ICD-10-CM | POA: Diagnosis not present

## 2015-04-20 DIAGNOSIS — M25531 Pain in right wrist: Secondary | ICD-10-CM | POA: Diagnosis not present

## 2015-04-20 DIAGNOSIS — M25431 Effusion, right wrist: Secondary | ICD-10-CM | POA: Diagnosis not present

## 2015-04-24 DIAGNOSIS — M25531 Pain in right wrist: Secondary | ICD-10-CM | POA: Diagnosis not present

## 2015-04-24 DIAGNOSIS — M25431 Effusion, right wrist: Secondary | ICD-10-CM | POA: Diagnosis not present

## 2015-04-26 DIAGNOSIS — M25431 Effusion, right wrist: Secondary | ICD-10-CM | POA: Diagnosis not present

## 2015-04-26 DIAGNOSIS — I712 Thoracic aortic aneurysm, without rupture: Secondary | ICD-10-CM | POA: Diagnosis not present

## 2015-04-26 DIAGNOSIS — I159 Secondary hypertension, unspecified: Secondary | ICD-10-CM | POA: Diagnosis not present

## 2015-04-26 DIAGNOSIS — E785 Hyperlipidemia, unspecified: Secondary | ICD-10-CM | POA: Diagnosis not present

## 2015-04-26 DIAGNOSIS — M25531 Pain in right wrist: Secondary | ICD-10-CM | POA: Diagnosis not present

## 2015-04-26 DIAGNOSIS — Z23 Encounter for immunization: Secondary | ICD-10-CM | POA: Diagnosis not present

## 2015-04-26 DIAGNOSIS — H9193 Unspecified hearing loss, bilateral: Secondary | ICD-10-CM | POA: Diagnosis not present

## 2015-04-27 DIAGNOSIS — F411 Generalized anxiety disorder: Secondary | ICD-10-CM | POA: Diagnosis not present

## 2015-04-28 DIAGNOSIS — M25561 Pain in right knee: Secondary | ICD-10-CM | POA: Diagnosis not present

## 2015-05-02 DIAGNOSIS — M25431 Effusion, right wrist: Secondary | ICD-10-CM | POA: Diagnosis not present

## 2015-05-02 DIAGNOSIS — M25531 Pain in right wrist: Secondary | ICD-10-CM | POA: Diagnosis not present

## 2015-05-03 ENCOUNTER — Encounter: Payer: Medicare Other | Admitting: Surgery

## 2015-05-04 DIAGNOSIS — M25531 Pain in right wrist: Secondary | ICD-10-CM | POA: Diagnosis not present

## 2015-05-04 DIAGNOSIS — M25431 Effusion, right wrist: Secondary | ICD-10-CM | POA: Diagnosis not present

## 2015-05-04 DIAGNOSIS — F411 Generalized anxiety disorder: Secondary | ICD-10-CM | POA: Diagnosis not present

## 2015-05-05 ENCOUNTER — Other Ambulatory Visit: Payer: Self-pay | Admitting: Family

## 2015-05-05 NOTE — Telephone Encounter (Signed)
Please advise pt's Trazodone refill request?  Last OV 12/2014 and no future appts scheduled. Pt became upset and walked out during last appt.

## 2015-05-11 DIAGNOSIS — F411 Generalized anxiety disorder: Secondary | ICD-10-CM | POA: Diagnosis not present

## 2015-05-22 DIAGNOSIS — F411 Generalized anxiety disorder: Secondary | ICD-10-CM | POA: Diagnosis not present

## 2015-05-24 DIAGNOSIS — I712 Thoracic aortic aneurysm, without rupture: Secondary | ICD-10-CM | POA: Diagnosis not present

## 2015-05-26 DIAGNOSIS — M25561 Pain in right knee: Secondary | ICD-10-CM | POA: Diagnosis not present

## 2015-05-29 DIAGNOSIS — M25431 Effusion, right wrist: Secondary | ICD-10-CM | POA: Diagnosis not present

## 2015-05-29 DIAGNOSIS — M25531 Pain in right wrist: Secondary | ICD-10-CM | POA: Diagnosis not present

## 2015-05-31 DIAGNOSIS — F411 Generalized anxiety disorder: Secondary | ICD-10-CM | POA: Diagnosis not present

## 2015-06-05 DIAGNOSIS — N401 Enlarged prostate with lower urinary tract symptoms: Secondary | ICD-10-CM | POA: Diagnosis not present

## 2015-06-05 DIAGNOSIS — E291 Testicular hypofunction: Secondary | ICD-10-CM | POA: Diagnosis not present

## 2015-06-06 ENCOUNTER — Other Ambulatory Visit: Payer: Self-pay | Admitting: Family

## 2015-06-06 ENCOUNTER — Encounter: Payer: Self-pay | Admitting: Cardiology

## 2015-06-06 DIAGNOSIS — Z570 Occupational exposure to noise: Secondary | ICD-10-CM | POA: Diagnosis not present

## 2015-06-06 DIAGNOSIS — Z822 Family history of deafness and hearing loss: Secondary | ICD-10-CM | POA: Diagnosis not present

## 2015-06-06 DIAGNOSIS — H903 Sensorineural hearing loss, bilateral: Secondary | ICD-10-CM | POA: Diagnosis not present

## 2015-06-08 DIAGNOSIS — F411 Generalized anxiety disorder: Secondary | ICD-10-CM | POA: Diagnosis not present

## 2015-06-08 DIAGNOSIS — M25531 Pain in right wrist: Secondary | ICD-10-CM | POA: Diagnosis not present

## 2015-06-08 DIAGNOSIS — M25431 Effusion, right wrist: Secondary | ICD-10-CM | POA: Diagnosis not present

## 2015-06-19 DIAGNOSIS — F411 Generalized anxiety disorder: Secondary | ICD-10-CM | POA: Diagnosis not present

## 2015-06-26 DIAGNOSIS — M25431 Effusion, right wrist: Secondary | ICD-10-CM | POA: Diagnosis not present

## 2015-06-26 DIAGNOSIS — M25531 Pain in right wrist: Secondary | ICD-10-CM | POA: Diagnosis not present

## 2015-06-28 DIAGNOSIS — F411 Generalized anxiety disorder: Secondary | ICD-10-CM | POA: Diagnosis not present

## 2015-07-05 DIAGNOSIS — Z9889 Other specified postprocedural states: Secondary | ICD-10-CM | POA: Diagnosis not present

## 2015-07-05 DIAGNOSIS — M65831 Other synovitis and tenosynovitis, right forearm: Secondary | ICD-10-CM | POA: Diagnosis not present

## 2015-07-05 DIAGNOSIS — M65841 Other synovitis and tenosynovitis, right hand: Secondary | ICD-10-CM | POA: Diagnosis not present

## 2015-07-05 DIAGNOSIS — G5602 Carpal tunnel syndrome, left upper limb: Secondary | ICD-10-CM | POA: Diagnosis not present

## 2015-07-05 DIAGNOSIS — S63111A Subluxation of metacarpophalangeal joint of right thumb, initial encounter: Secondary | ICD-10-CM | POA: Diagnosis not present

## 2015-07-05 DIAGNOSIS — M19031 Primary osteoarthritis, right wrist: Secondary | ICD-10-CM | POA: Diagnosis not present

## 2015-07-05 DIAGNOSIS — M21831 Other specified acquired deformities of right forearm: Secondary | ICD-10-CM | POA: Diagnosis not present

## 2015-07-06 DIAGNOSIS — F411 Generalized anxiety disorder: Secondary | ICD-10-CM | POA: Diagnosis not present

## 2015-07-13 DIAGNOSIS — F411 Generalized anxiety disorder: Secondary | ICD-10-CM | POA: Diagnosis not present

## 2015-07-19 DIAGNOSIS — G4440 Drug-induced headache, not elsewhere classified, not intractable: Secondary | ICD-10-CM | POA: Diagnosis not present

## 2015-07-19 DIAGNOSIS — G43109 Migraine with aura, not intractable, without status migrainosus: Secondary | ICD-10-CM | POA: Diagnosis not present

## 2015-07-19 DIAGNOSIS — Z87891 Personal history of nicotine dependence: Secondary | ICD-10-CM | POA: Diagnosis not present

## 2015-07-26 DIAGNOSIS — F411 Generalized anxiety disorder: Secondary | ICD-10-CM | POA: Diagnosis not present

## 2015-08-03 DIAGNOSIS — G43109 Migraine with aura, not intractable, without status migrainosus: Secondary | ICD-10-CM | POA: Diagnosis not present

## 2015-08-09 DIAGNOSIS — F411 Generalized anxiety disorder: Secondary | ICD-10-CM | POA: Diagnosis not present

## 2015-08-15 ENCOUNTER — Other Ambulatory Visit: Payer: Self-pay | Admitting: Cardiology

## 2015-08-15 NOTE — Telephone Encounter (Signed)
Medication Detail      Disp Refills Start End     losartan (COZAAR) 25 MG tablet 30 tablet 6 01/02/2015     Sig - Route: Take 1 tablet (25 mg total) by mouth daily. - Oral    E-Prescribing Status: Receipt confirmed by pharmacy (01/02/2015 3:05 PM EDT)     Pharmacy    [facility] [place], [place]

## 2015-08-16 DIAGNOSIS — F411 Generalized anxiety disorder: Secondary | ICD-10-CM | POA: Diagnosis not present

## 2015-08-18 ENCOUNTER — Other Ambulatory Visit: Payer: Self-pay | Admitting: Cardiology

## 2015-08-24 DIAGNOSIS — F411 Generalized anxiety disorder: Secondary | ICD-10-CM | POA: Diagnosis not present

## 2015-08-30 DIAGNOSIS — F411 Generalized anxiety disorder: Secondary | ICD-10-CM | POA: Diagnosis not present

## 2015-08-31 DIAGNOSIS — E785 Hyperlipidemia, unspecified: Secondary | ICD-10-CM | POA: Diagnosis not present

## 2015-08-31 DIAGNOSIS — I712 Thoracic aortic aneurysm, without rupture: Secondary | ICD-10-CM | POA: Diagnosis not present

## 2015-08-31 DIAGNOSIS — F418 Other specified anxiety disorders: Secondary | ICD-10-CM | POA: Diagnosis not present

## 2015-08-31 DIAGNOSIS — H9193 Unspecified hearing loss, bilateral: Secondary | ICD-10-CM | POA: Diagnosis not present

## 2015-08-31 DIAGNOSIS — I159 Secondary hypertension, unspecified: Secondary | ICD-10-CM | POA: Diagnosis not present

## 2015-08-31 DIAGNOSIS — F419 Anxiety disorder, unspecified: Secondary | ICD-10-CM | POA: Diagnosis not present

## 2015-09-07 DIAGNOSIS — F411 Generalized anxiety disorder: Secondary | ICD-10-CM | POA: Diagnosis not present

## 2015-09-14 DIAGNOSIS — F411 Generalized anxiety disorder: Secondary | ICD-10-CM | POA: Diagnosis not present

## 2015-09-22 DIAGNOSIS — E291 Testicular hypofunction: Secondary | ICD-10-CM | POA: Diagnosis not present

## 2015-09-22 DIAGNOSIS — Z79899 Other long term (current) drug therapy: Secondary | ICD-10-CM | POA: Diagnosis not present

## 2015-09-28 DIAGNOSIS — G4440 Drug-induced headache, not elsewhere classified, not intractable: Secondary | ICD-10-CM | POA: Diagnosis not present

## 2015-10-04 DIAGNOSIS — Z961 Presence of intraocular lens: Secondary | ICD-10-CM | POA: Diagnosis not present

## 2015-10-04 DIAGNOSIS — H35073 Retinal telangiectasis, bilateral: Secondary | ICD-10-CM | POA: Diagnosis not present

## 2015-10-05 DIAGNOSIS — F411 Generalized anxiety disorder: Secondary | ICD-10-CM | POA: Diagnosis not present

## 2015-10-09 DIAGNOSIS — N401 Enlarged prostate with lower urinary tract symptoms: Secondary | ICD-10-CM | POA: Diagnosis not present

## 2015-10-09 DIAGNOSIS — E291 Testicular hypofunction: Secondary | ICD-10-CM | POA: Diagnosis not present

## 2015-10-09 DIAGNOSIS — R351 Nocturia: Secondary | ICD-10-CM | POA: Diagnosis not present

## 2015-10-09 DIAGNOSIS — N529 Male erectile dysfunction, unspecified: Secondary | ICD-10-CM | POA: Diagnosis not present

## 2015-10-19 DIAGNOSIS — M79671 Pain in right foot: Secondary | ICD-10-CM | POA: Diagnosis not present

## 2015-10-19 DIAGNOSIS — M13871 Other specified arthritis, right ankle and foot: Secondary | ICD-10-CM | POA: Diagnosis not present

## 2015-10-19 DIAGNOSIS — M1990 Unspecified osteoarthritis, unspecified site: Secondary | ICD-10-CM | POA: Diagnosis not present

## 2015-10-23 DIAGNOSIS — F411 Generalized anxiety disorder: Secondary | ICD-10-CM | POA: Diagnosis not present

## 2015-10-30 DIAGNOSIS — F411 Generalized anxiety disorder: Secondary | ICD-10-CM | POA: Diagnosis not present

## 2015-11-07 DIAGNOSIS — F411 Generalized anxiety disorder: Secondary | ICD-10-CM | POA: Diagnosis not present

## 2015-11-13 DIAGNOSIS — D1801 Hemangioma of skin and subcutaneous tissue: Secondary | ICD-10-CM | POA: Diagnosis not present

## 2015-11-13 DIAGNOSIS — L814 Other melanin hyperpigmentation: Secondary | ICD-10-CM | POA: Diagnosis not present

## 2015-11-13 DIAGNOSIS — Z85828 Personal history of other malignant neoplasm of skin: Secondary | ICD-10-CM | POA: Diagnosis not present

## 2015-11-13 DIAGNOSIS — F411 Generalized anxiety disorder: Secondary | ICD-10-CM | POA: Diagnosis not present

## 2015-11-13 DIAGNOSIS — A630 Anogenital (venereal) warts: Secondary | ICD-10-CM | POA: Diagnosis not present

## 2015-11-13 DIAGNOSIS — D225 Melanocytic nevi of trunk: Secondary | ICD-10-CM | POA: Diagnosis not present

## 2015-11-13 DIAGNOSIS — H02826 Cysts of left eye, unspecified eyelid: Secondary | ICD-10-CM | POA: Diagnosis not present

## 2015-11-13 DIAGNOSIS — L821 Other seborrheic keratosis: Secondary | ICD-10-CM | POA: Diagnosis not present

## 2015-11-20 DIAGNOSIS — F411 Generalized anxiety disorder: Secondary | ICD-10-CM | POA: Diagnosis not present

## 2015-12-04 DIAGNOSIS — F411 Generalized anxiety disorder: Secondary | ICD-10-CM | POA: Diagnosis not present

## 2015-12-14 DIAGNOSIS — F411 Generalized anxiety disorder: Secondary | ICD-10-CM | POA: Diagnosis not present

## 2015-12-21 DIAGNOSIS — F411 Generalized anxiety disorder: Secondary | ICD-10-CM | POA: Diagnosis not present

## 2015-12-26 ENCOUNTER — Other Ambulatory Visit: Payer: Self-pay | Admitting: Cardiology

## 2016-01-01 ENCOUNTER — Ambulatory Visit: Payer: Medicare Other | Admitting: Cardiology

## 2016-01-03 DIAGNOSIS — F411 Generalized anxiety disorder: Secondary | ICD-10-CM | POA: Diagnosis not present

## 2016-01-08 DIAGNOSIS — M65841 Other synovitis and tenosynovitis, right hand: Secondary | ICD-10-CM | POA: Diagnosis not present

## 2016-01-08 DIAGNOSIS — M65831 Other synovitis and tenosynovitis, right forearm: Secondary | ICD-10-CM | POA: Diagnosis not present

## 2016-01-15 DIAGNOSIS — F411 Generalized anxiety disorder: Secondary | ICD-10-CM | POA: Diagnosis not present

## 2016-01-30 DIAGNOSIS — F411 Generalized anxiety disorder: Secondary | ICD-10-CM | POA: Diagnosis not present

## 2016-01-31 ENCOUNTER — Encounter: Payer: Self-pay | Admitting: Cardiology

## 2016-01-31 ENCOUNTER — Other Ambulatory Visit: Payer: Self-pay | Admitting: Cardiology

## 2016-02-06 ENCOUNTER — Encounter: Payer: Self-pay | Admitting: Cardiology

## 2016-02-06 DIAGNOSIS — R351 Nocturia: Secondary | ICD-10-CM | POA: Diagnosis not present

## 2016-02-06 DIAGNOSIS — Z79899 Other long term (current) drug therapy: Secondary | ICD-10-CM | POA: Diagnosis not present

## 2016-02-06 DIAGNOSIS — N401 Enlarged prostate with lower urinary tract symptoms: Secondary | ICD-10-CM | POA: Diagnosis not present

## 2016-02-06 DIAGNOSIS — E291 Testicular hypofunction: Secondary | ICD-10-CM | POA: Diagnosis not present

## 2016-02-07 DIAGNOSIS — F411 Generalized anxiety disorder: Secondary | ICD-10-CM | POA: Diagnosis not present

## 2016-02-12 DIAGNOSIS — R351 Nocturia: Secondary | ICD-10-CM | POA: Diagnosis not present

## 2016-02-12 DIAGNOSIS — N401 Enlarged prostate with lower urinary tract symptoms: Secondary | ICD-10-CM | POA: Diagnosis not present

## 2016-02-12 DIAGNOSIS — E291 Testicular hypofunction: Secondary | ICD-10-CM | POA: Diagnosis not present

## 2016-02-19 DIAGNOSIS — F411 Generalized anxiety disorder: Secondary | ICD-10-CM | POA: Diagnosis not present

## 2016-02-27 DIAGNOSIS — Z0000 Encounter for general adult medical examination without abnormal findings: Secondary | ICD-10-CM | POA: Diagnosis not present

## 2016-02-27 DIAGNOSIS — I25119 Atherosclerotic heart disease of native coronary artery with unspecified angina pectoris: Secondary | ICD-10-CM | POA: Diagnosis not present

## 2016-02-27 DIAGNOSIS — E785 Hyperlipidemia, unspecified: Secondary | ICD-10-CM | POA: Diagnosis not present

## 2016-02-27 DIAGNOSIS — I159 Secondary hypertension, unspecified: Secondary | ICD-10-CM | POA: Diagnosis not present

## 2016-02-28 DIAGNOSIS — F411 Generalized anxiety disorder: Secondary | ICD-10-CM | POA: Diagnosis not present

## 2016-03-06 DIAGNOSIS — F411 Generalized anxiety disorder: Secondary | ICD-10-CM | POA: Diagnosis not present

## 2016-03-18 DIAGNOSIS — F411 Generalized anxiety disorder: Secondary | ICD-10-CM | POA: Diagnosis not present

## 2016-03-28 ENCOUNTER — Ambulatory Visit (INDEPENDENT_AMBULATORY_CARE_PROVIDER_SITE_OTHER): Payer: Medicare Other | Admitting: Cardiology

## 2016-03-28 ENCOUNTER — Encounter: Payer: Self-pay | Admitting: Cardiology

## 2016-03-28 VITALS — BP 134/68 | HR 55 | Ht 67.5 in | Wt 153.4 lb

## 2016-03-28 DIAGNOSIS — E785 Hyperlipidemia, unspecified: Secondary | ICD-10-CM

## 2016-03-28 DIAGNOSIS — I10 Essential (primary) hypertension: Secondary | ICD-10-CM

## 2016-03-28 DIAGNOSIS — I712 Thoracic aortic aneurysm, without rupture: Secondary | ICD-10-CM | POA: Diagnosis not present

## 2016-03-28 DIAGNOSIS — I7121 Aneurysm of the ascending aorta, without rupture: Secondary | ICD-10-CM

## 2016-03-28 DIAGNOSIS — I2510 Atherosclerotic heart disease of native coronary artery without angina pectoris: Secondary | ICD-10-CM

## 2016-03-28 DIAGNOSIS — I2583 Coronary atherosclerosis due to lipid rich plaque: Principal | ICD-10-CM

## 2016-03-28 DIAGNOSIS — I351 Nonrheumatic aortic (valve) insufficiency: Secondary | ICD-10-CM

## 2016-03-28 DIAGNOSIS — F411 Generalized anxiety disorder: Secondary | ICD-10-CM | POA: Diagnosis not present

## 2016-03-28 NOTE — Progress Notes (Signed)
Cardiology Office Note    Date:  03/28/2016   ID:  [name], DOB January 15, 1946, MRN [id number]  PCP:  Drew Pear., NP  Cardiologist:  Fransico Him, MD   Chief Complaint  Patient presents with  . Coronary Artery Disease  . Hypertension  . Hyperlipidemia    History of Present Illness:  [name] is a 70 y.o. male with a history of CAD with CABG in 1998 after presenting with USAP.  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  He is followed by Dr. Clementeen [name] at [facility].   He has mild carotid artery stenosis on dopplers.  He has a history of dyslipidemia.  He has a history of mild renal insufficiency felt secondary to hypertensive nephrosclerosis.  He denies any chest pain or pressure, SOB, DOE, dizziness, palpitations, claudication or syncope.  He occasionally has some pedal edema if he eats too much salt in his diet.    Past Medical History:  Diagnosis Date  . Arthritis   . BPH (benign prostatic hyperplasia)   . Depression   . History of blood transfusion 1998   "due to cardiac bypass surgery"  . Hyperlipidemia   . Hypertension   . Hypogonadism in male   . Migraine   . Retinal hemorrhage 11/04/2014    Past Surgical History:  Procedure Laterality Date  . CORONARY ARTERY BYPASS GRAFT  1998  . TEE WITHOUT CARDIOVERSION N/A 01/19/2015   Procedure: TRANSESOPHAGEAL ECHOCARDIOGRAM (TEE);  Surgeon: Sueanne Margarita, MD;  Location[facility] ENDOSCOPY;  Service: Cardiovascular;  Laterality: N/A;  . VARICOSE VEIN SURGERY  1984    Current Medications: Outpatient Medications Prior to Visit  Medication Sig Dispense Refill  . amLODipine (NORVASC) 10 MG tablet TAKE ONE TABLET BY MOUTH DAILY 30 tablet 2  . Ascorbic Acid (VITAMIN C) 1000 MG tablet Take 1,000 mg by mouth 2 (two) times daily.    Marland Kitchen aspirin EC 81 MG tablet Take 81 mg by mouth daily.    Marland Kitchen losartan (COZAAR) 25 MG tablet TAKE 1 TABLET (25 MG TOTAL) BY  MOUTH DAILY. 30 tablet 6  . metoprolol succinate (TOPROL-XL) 100 MG 24 hr tablet TAKE 1 TABLET (100 MG TOTAL) BY MOUTH DAILY. TAKE WITH OR IMMEDIATELY FOLLOWING A MEAL. 30 tablet 11  . Multiple Vitamins-Minerals (CENTRUM ADULTS PO) Take 1 tablet by mouth daily.    . niacin 500 MG tablet Take 500 mg by mouth at bedtime.    . pravastatin (PRAVACHOL) 20 MG tablet TAKE 1 TABLET BY MOUTH EVERY DAY 30 tablet 6  . tadalafil (CIALIS) 5 MG tablet Take 5 mg by mouth daily as needed for erectile dysfunction.    . Testosterone (FORTESTA TD) Place 2 Act onto the skin daily. 1.62%    . traZODone (DESYREL) 100 MG tablet TAKE 1 TABLET BY MOUTH NIGHTLY AT BEDTIME 30 tablet 0  . vitamin E 400 UNIT capsule Take 400 Units by mouth daily.     No facility-administered medications prior to visit.      Allergies:   Mushroom extract complex and Propoxyphene   Social History   Social History  . Marital status: Divorced    Spouse name: N/A  . Number of children: N/A  . Years of education: N/A   Social History Main Topics  . Smoking status: Former Smoker    Years: 20.00    Quit date: 07/22/1986  . Smokeless tobacco: Never  Used  . Alcohol use Yes     Comment: 3-4 weekly  . Drug use: No  . Sexual activity: Not Asked   Other Topics Concern  . None   Social History Narrative   Divorced   Secondary school teacher- retired   No children   Has a Neurosurgeon named [name]   Enjoys outdoor activities- hiking, swimming, Control and instrumentation engineer, baseball, writing, reading, cooking     Family History:  The patient's family history includes Arthritis in his father and mother; Heart disease (age of onset: 76) in his father; Hyperlipidemia in his father and mother; Hypertension in his father; Kidney disease in his paternal grandfather; Stroke (age of onset: 82) in his father.   ROS:   Please see the history of present illness.    ROS All other systems reviewed and are negative.   PHYSICAL EXAM:   VS:  BP 134/68   Pulse (!) 55   Ht 5' 7.5"  (1.715 m)   Wt 153 lb 6.4 oz (69.6 kg)   BMI 23.67 kg/m    GEN: Well nourished, well developed, in no acute distress  HEENT: normal  Neck: no JVD, carotid bruits, or masses Cardiac: RRR; no murmurs, rubs, or gallops,no edema.  Intact distal pulses bilaterally.  Respiratory:  clear to auscultation bilaterally, normal work of breathing GI: soft, nontender, nondistended, + BS MS: no deformity or atrophy  Skin: warm and dry, no rash Neuro:  Alert and Oriented x 3, Strength and sensation are intact Psych: euthymic mood, full affect  Wt Readings from Last 3 Encounters:  03/28/16 153 lb 6.4 oz (69.6 kg)  01/10/15 146 lb 12.8 oz (66.6 kg)  12/30/14 151 lb (68.5 kg)      Studies/Labs Reviewed:   EKG:  EKG is sinus bradycardia at 55bpm ordered today.    Recent Labs: No results found for requested labs within last 8760 hours.   Lipid Panel    Component Value Date/Time   CHOL 133 10/05/2014 0931   TRIG 135.0 10/05/2014 0931   HDL 43.30 10/05/2014 0931   CHOLHDL 3 10/05/2014 0931   VLDL 27.0 10/05/2014 0931   LDLCALC 63 10/05/2014 0931    Additional studies/ records that were reviewed today include:  none    ASSESSMENT:    1. Coronary artery disease due to lipid rich plaque   2. Ascending aortic aneurysm ([place])   3. Aortic insufficiency   4. Essential hypertension   5. Hyperlipidemia      PLAN:  In order of problems listed above:  1. ASCAD s/p remote CABG.  Continue ASA/BB/statin. 2. Ascending aortic aneurysm followed by Dr. Clementeen [name].  He has a chest CT pending in November for followup. 3. Moderate to severe AI by TEE a year ago.  Repeat echo to assess for LV dilatation or decline in LVF. 4. HTN - BP controlled on current meds.  Continue amlodipine/ARB/BB. 5. Hyperlipidemia - LDL goal < 70.  Continue statin. LDL at last PCP office was at goal at 67.      Medication Adjustments/Labs and Tests Ordered: Current medicines are reviewed at length with the patient today.   Concerns regarding medicines are outlined above.  Medication changes, Labs and Tests ordered today are listed in the Patient Instructions below.  There are no Patient Instructions on file for this visit.   Signed, Fransico Him, MD  03/28/2016 8:56 AM    [facility] [place], [place], [place]  [address] Phone: [phone]; Fax: [phone]

## 2016-03-28 NOTE — Patient Instructions (Signed)

## 2016-04-08 DIAGNOSIS — F411 Generalized anxiety disorder: Secondary | ICD-10-CM | POA: Diagnosis not present

## 2016-04-10 ENCOUNTER — Other Ambulatory Visit (HOSPITAL_COMMUNITY): Payer: Medicare Other

## 2016-04-12 ENCOUNTER — Other Ambulatory Visit: Payer: Self-pay | Admitting: Cardiology

## 2016-04-17 ENCOUNTER — Ambulatory Visit (HOSPITAL_COMMUNITY): Payer: Medicare Other | Attending: Cardiovascular Disease

## 2016-04-17 ENCOUNTER — Other Ambulatory Visit: Payer: Self-pay

## 2016-04-17 DIAGNOSIS — I351 Nonrheumatic aortic (valve) insufficiency: Secondary | ICD-10-CM | POA: Diagnosis not present

## 2016-04-17 DIAGNOSIS — I7121 Aneurysm of the ascending aorta, without rupture: Secondary | ICD-10-CM

## 2016-04-17 DIAGNOSIS — I712 Thoracic aortic aneurysm, without rupture: Secondary | ICD-10-CM | POA: Diagnosis not present

## 2016-04-17 DIAGNOSIS — I340 Nonrheumatic mitral (valve) insufficiency: Secondary | ICD-10-CM | POA: Diagnosis not present

## 2016-04-18 ENCOUNTER — Encounter: Payer: Self-pay | Admitting: Cardiology

## 2016-04-18 DIAGNOSIS — H9193 Unspecified hearing loss, bilateral: Secondary | ICD-10-CM | POA: Diagnosis not present

## 2016-04-18 DIAGNOSIS — E785 Hyperlipidemia, unspecified: Secondary | ICD-10-CM | POA: Diagnosis not present

## 2016-04-18 DIAGNOSIS — F418 Other specified anxiety disorders: Secondary | ICD-10-CM | POA: Diagnosis not present

## 2016-04-18 DIAGNOSIS — I712 Thoracic aortic aneurysm, without rupture: Secondary | ICD-10-CM | POA: Diagnosis not present

## 2016-04-18 DIAGNOSIS — I25119 Atherosclerotic heart disease of native coronary artery with unspecified angina pectoris: Secondary | ICD-10-CM | POA: Diagnosis not present

## 2016-04-18 DIAGNOSIS — I10 Essential (primary) hypertension: Secondary | ICD-10-CM | POA: Diagnosis not present

## 2016-04-22 DIAGNOSIS — F411 Generalized anxiety disorder: Secondary | ICD-10-CM | POA: Diagnosis not present

## 2016-04-23 ENCOUNTER — Telehealth: Payer: Self-pay | Admitting: Cardiology

## 2016-04-23 DIAGNOSIS — I351 Nonrheumatic aortic (valve) insufficiency: Secondary | ICD-10-CM

## 2016-04-23 NOTE — Telephone Encounter (Signed)
Notes Recorded by Sueanne Margarita, MD on 04/18/2016 at 10:22 AM EDT Echo showed normal LVF with mild AI, moderately dilated aortic root at 49mm and mild MR. Please refer to CVTS and get Cardiac/chest MRI/MRA angio to assess further.

## 2016-04-23 NOTE — Telephone Encounter (Signed)
I discussed echo results and Dr Theodosia Blender recommendations with pt. Pt states that he is already established with Dr Clementeen Graham, cardiovascular surgeon at [facility]. Pt states he is scheduled for chest CT and follow up appt with Dr Clementeen Graham in November 2017. Pt advised I will forward this information to Dr Radford Pax for review.  Pt advised I will forward a copy of echo to Dr Clementeen Graham. Pt advised I will ask [facility] [place] to contact him to schedule echo for 6 months.

## 2016-04-23 NOTE — Telephone Encounter (Signed)
New message    Pt verbalized that he is returning call for rn about the echo results

## 2016-04-25 ENCOUNTER — Encounter: Payer: Self-pay | Admitting: Cardiology

## 2016-04-29 DIAGNOSIS — F411 Generalized anxiety disorder: Secondary | ICD-10-CM | POA: Diagnosis not present

## 2016-05-07 ENCOUNTER — Encounter: Payer: Self-pay | Admitting: Neurology

## 2016-05-07 ENCOUNTER — Ambulatory Visit (INDEPENDENT_AMBULATORY_CARE_PROVIDER_SITE_OTHER): Payer: Medicare Other | Admitting: Neurology

## 2016-05-07 VITALS — BP 140/68 | HR 76 | Resp 20 | Ht 68.0 in | Wt 158.0 lb

## 2016-05-07 DIAGNOSIS — R51 Headache: Secondary | ICD-10-CM | POA: Diagnosis not present

## 2016-05-07 DIAGNOSIS — R519 Headache, unspecified: Secondary | ICD-10-CM

## 2016-05-07 DIAGNOSIS — R351 Nocturia: Secondary | ICD-10-CM | POA: Diagnosis not present

## 2016-05-07 DIAGNOSIS — I2583 Coronary atherosclerosis due to lipid rich plaque: Secondary | ICD-10-CM

## 2016-05-07 DIAGNOSIS — R0683 Snoring: Secondary | ICD-10-CM | POA: Diagnosis not present

## 2016-05-07 DIAGNOSIS — Z72820 Sleep deprivation: Secondary | ICD-10-CM

## 2016-05-07 DIAGNOSIS — G43101 Migraine with aura, not intractable, with status migrainosus: Secondary | ICD-10-CM

## 2016-05-07 DIAGNOSIS — I2510 Atherosclerotic heart disease of native coronary artery without angina pectoris: Secondary | ICD-10-CM

## 2016-05-07 DIAGNOSIS — F3342 Major depressive disorder, recurrent, in full remission: Secondary | ICD-10-CM | POA: Diagnosis not present

## 2016-05-07 DIAGNOSIS — F411 Generalized anxiety disorder: Secondary | ICD-10-CM | POA: Diagnosis not present

## 2016-05-07 MED ORDER — BUTALBITAL-ASA-CAFF-CODEINE 50-325-40-30 MG PO CAPS: 1.0000 | ORAL_CAPSULE | ORAL | 1 refills | Status: DC | PRN

## 2016-05-07 MED ORDER — ZONISAMIDE 25 MG PO CAPS: 25.0000 mg | ORAL_CAPSULE | Freq: Every day | ORAL | 5 refills | Status: DC

## 2016-05-07 NOTE — Progress Notes (Signed)
SLEEP MEDICINE CLINIC   Provider:  Larey Seat, M D  Referring Provider: Debbrah Alar, NP Primary Care Physician:  Ara Kussmaul, MD  Chief Complaint  Patient presents with  . New Patient (Initial Visit)    migraines, brought headache record and sleep record    HPI:  [name] is a 70 y.o. male , seen here as a referral from Dr. Kerney Elbe -Larena Glassman, MD at [facility] for an evaluation,  [name] has a history of hypercholesterolemia of heart disease, migraines, anxiety and hypertension he had undergone a cardiac bypass surgery in 1998 at a rather young age, and most recently had surgery for the replacement of cloudy lenses, both eyes had cataract surgery in 2013 at 2014. He describes insomnia and headaches as well as anxiety and hearing loss. He is to establish himself as a new neurologist and to see if his headache history is correlated with his sleep disturbance.  He has an aortic aneurysm, arthritis, Aortic valve regurgitation.   The patient reports that headaches prevent him from going to sleep but they do not wake him up and then not always present when he wakes up. He was originally diagnosed in college with migrainous headaches 50 years ago, these headaches are intense, sometimes associated with a visual aura, almost always nausea to the level of emesis was present. Migraines may have lasted for longer than 24 hours up to 3 days in its worst kind. He had photophobia, phonophobia but no olfactory triggers. He was treated with powerful narcotics, such as Talwin, Mepergan forte, Percodan, Percocet and nasal sprays . He would have as many as 15 migraines with hospital admission per semester, affecting his social and professional life. After his bypass surgery in 1998 he experienced a significant reduction in migraine frequency but not intensity. A beta blocker had been prescribed and seemed to reduce his headache frequency. He was switched to Fiorinal, and has been  happy with the fact to this day. For 25 years he has been able to control his migraines and stay out of the emergency room, needing no infusions or injections. He has kept a daily record of his headaches he takes Excedrin for tension headache and fewer note this codeine for migraines only. He had a migraine on August 1, the 11th, August 27 and 28th, and apparently found relief with Fiorinal. He has never overused the  medication, never requested early refills. He takes on average 5 -7 fiorinal per month.   Sleep habits are as follows:  He used to have nocturia times 5, ,but after implementing Cialis 5 mg daily use he only experienced this 1-2 nightly.  Bedtime is late-, between 11 PM and midnight . He falls asleep within 10-15 minutes. He uses Trazodone, melatonin without much success.  He frequently wakes up after 4 hours of sleep sometimes 5, 50% of the nights he cannot continue and return to sleep and will and right there. He has to rise in the morning at about 6:30 AM. This allows for only 6-1/2 hours of sleep. It has been a long-standing habit of his to go to bed at bedtime and rise at that time. Some nights he will only get 4 hours of sleep. His bedroom is described as cool, quiet and dark. He sleeps in supine position, and uses just one pillow for head support. Waking up in the morning is only on occasion associated with a headache, normally no dry mouth nor diaphoresis palpitations. He does not report any  nightmares. He is usually not woken by pain of any kind. He does not suffer from gastroesophageal acid reflux.    Sleep medical history and family sleep history:  Both parents were insomniacs. One brother, too. He was a good sleeper during his childhood and high school years.   Social history:  Divorced, he quit in 1988 after 20 pack years. Has not used tobacco in any form since. 3-5 alcoholic beverages a week. Caffeine use he drinks a lot of Coca-Cola and diet sodas. About 6 a day, no tea or  coffee.  Retired from city/ urban  planning.    History of Present Illness:  [name] is a 70 y.o. male with a history of CAD with CABG in 1998 after presenting with USAP.  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  He is followed by Dr. Clementeen Graham at [facility].   He has mild carotid artery stenosis on dopplers.  He has a history of dyslipidemia.  He has a history of mild renal insufficiency felt secondary to hypertensive nephrosclerosis.  He denies any chest pain or pressure, SOB, DOE, dizziness, palpitations, claudication or syncope.  He occasionally has some pedal edema if he eats too much salt in his diet.     Review of Systems: Out of a complete 14 system review, the patient complains of only the following symptoms, and all other reviewed systems are negative. Migraine, tension headaches, insomnia with early morning awakening.   Epworth score  2 , Fatigue severity score 26  , geriatric  depression score 1/15    Social History   Social History  . Marital status: Divorced    Spouse name: N/A  . Number of children: N/A  . Years of education: N/A   Occupational History  . Not on file.   Social History Main Topics  . Smoking status: Former Smoker    Years: 20.00    Quit date: 07/22/1986  . Smokeless tobacco: Never Used  . Alcohol use Yes     Comment: 3-4 weekly  . Drug use: No  . Sexual activity: Not on file   Other Topics Concern  . Not on file   Social History Narrative   Divorced   Secondary school teacher- retired   No children   Has a Neurosurgeon named [name]   Enjoys outdoor activities- hiking, swimming, archeology, baseball, writing, reading, cooking    Family History  Problem Relation Age of Onset  . Arthritis Mother     deceased  . Hyperlipidemia Mother   . Arthritis Father   . Hyperlipidemia Father   . Heart disease Father 82  . Stroke Father 52    deceased  . Hypertension Father   . Kidney  disease Paternal Grandfather     Past Medical History:  Diagnosis Date  . Arthritis   . BPH (benign prostatic hyperplasia)   . Depression   . Dilated aortic root (HCC)    5mm by echo 03/2016  . History of blood transfusion 1998   "due to cardiac bypass surgery"  . Hyperlipidemia   . Hypertension   . Hypogonadism in male   . Migraine   . Retinal hemorrhage 11/04/2014    Past Surgical History:  Procedure Laterality Date  . CORONARY ARTERY BYPASS GRAFT  1998  . TEE WITHOUT CARDIOVERSION N/A 01/19/2015   Procedure: TRANSESOPHAGEAL ECHOCARDIOGRAM (TEE);  Surgeon: Sueanne Margarita, MD;  Location: [facility];  Service: Cardiovascular;  Laterality: N/A;  . VARICOSE VEIN SURGERY  1984    Current Outpatient Prescriptions  Medication Sig Dispense Refill  . amLODipine (NORVASC) 10 MG tablet TAKE ONE TABLET BY MOUTH DAILY 30 tablet 2  . Ascorbic Acid (VITAMIN C) 1000 MG tablet Take 1,000 mg by mouth 2 (two) times daily.    Marland Kitchen aspirin EC 81 MG tablet Take 81 mg by mouth daily.    Marland Kitchen gabapentin (NEURONTIN) 300 MG capsule Take 300 mg by mouth at bedtime.    Marland Kitchen losartan (COZAAR) 25 MG tablet TAKE 1 TABLET (25 MG TOTAL) BY MOUTH DAILY. 30 tablet 6  . metoprolol succinate (TOPROL-XL) 100 MG 24 hr tablet TAKE 1 TABLET (100 MG TOTAL) BY MOUTH DAILY. TAKE WITH OR IMMEDIATELY FOLLOWING A MEAL. 30 tablet 11  . Multiple Vitamins-Minerals (CENTRUM ADULTS PO) Take 1 tablet by mouth daily.    . niacin 500 MG tablet Take 500 mg by mouth at bedtime.    . pravastatin (PRAVACHOL) 20 MG tablet TAKE 1 TABLET BY MOUTH EVERY DAY 30 tablet 6  . tadalafil (CIALIS) 5 MG tablet Take 5 mg by mouth daily as needed for erectile dysfunction.    . Testosterone (FORTESTA TD) Place 2 Act onto the skin daily. 1.62%    . TURMERIC PO Take by mouth.    . vitamin E 400 UNIT capsule Take 400 Units by mouth daily.     No current facility-administered medications for this visit.     Allergies as of 05/07/2016 - Review Complete  05/07/2016  Allergen Reaction Noted  . Mushroom extract complex  09/21/2014  . Propoxyphene Nausea And Vomiting 01/20/2015    Vitals: BP 140/68   Pulse 76   Resp 20   Ht 5\' 8"  (1.727 m)   Wt 158 lb (71.7 kg)   BMI 24.02 kg/m  Last Weight:  Wt Readings from Last 1 Encounters:  05/07/16 158 lb (71.7 kg)   [id number] mass index is 24.02 kg/m.     Last Height:   Ht Readings from Last 1 Encounters:  05/07/16 5\' 8"  (1.727 m)    Physical exam:  General: The patient is awake, alert and appears not in acute distress. The patient is well groomed. Head: Normocephalic, atraumatic. Neck is supple. Mallampati 2 ,  Dentures partial  neck circumference:15. Nasal airflow patent , TMJ not evident. Retrognathia is seen.  Cardiovascular:  Regular rate and rhythm , without murmurs or carotid bruit, and without distended neck veins. Respiratory: Lungs are clear to auscultation. Skin:  Without evidence of edema, or rash Trunk: BMI is normal. The patient's posture is erect   Neurologic exam : The patient is awake and alert, oriented to place and time.    Attention span & concentration ability appears impaired  Speech is fluent, without dysarthria, dysphonia or aphasia.  Mood and affect are appropriate.  Cranial nerves: Pupils are equal and briskly reactive to light. Funduscopic exam without evidence of pallor or edema.  Extraocular movements  in vertical and horizontal planes intact and without nystagmus. Visual fields by finger perimetry are intact. Hearing impaired.  Facial sensation intact to fine touch.  Facial motor strength is symmetric and tongue and uvula move midline. Shoulder shrug was symmetrical.   Motor exam: Normal tone, muscle bulk and symmetric strength in all extremities.  Sensory:  Fine touch, pinprick and vibration were tested in all extremities.   Coordination: Rapid alternating movements without evidence of ataxia, dysmetria or tremor.  Gait and station: Patient  walks  without assistive device and is able unassisted to climb up to the exam table. Strength within normal limits.  Stance is stable and normal.  Deep tendon reflexes: in the  upper and lower extremities are symmetric and intact.    The patient was advised of the nature of the diagnosed sleep disorder , the treatment options and risks for general a health and wellness arising from not treating the condition.  I spent more than 45 minutes of face to face time with the patient. Greater than 50% of time was spent in counseling and coordination of care. We have discussed the diagnosis and differential and I answered the patient's questions.     Assessment:  After physical and neurologic examination, review of laboratory studies,  Personal review of imaging studies, reports of other /same  Imaging studies ,  Results of polysomnography/ neurophysiology testing and pre-existing records as far as provided in visit., my assessment is   1)  insomnia begun around age 76 , possibly related to divorce. His insomnia has always been manifesting as an early morning awakening with difficulties to continue the night and not so much as an inability to fall asleep in the first place. This is often seen in relationship to anxiety and depression but can also have organic reasons. He had 4 years severe nocturia was up to 5 bathroom breaks at night, fragmenting his sleep. This has only over the last 2 years been improved with the help of his urologist and is now reduced to 1 or 2 bathroom breaks at night. There is a chronic component to his insomnia but I would like for him to have an attended sleep study to see if he suffers from any organic contributory reasons.   2) he has a mixed headache type, most frequently suffering from tension headaches which respond to nonsteroidals, but more infrequently this days from migraine headaches which require Excedrin or Fiorinal. He assured me that over the last almost 25 years that he has  been taking the medication he has never abused it. He has never had a sleep study so possible contributory factors to headaches such as hypoxemia at night or hypercapnia has never been evaluated.  The patient has an extensive cardiovascular health history, trip times cannot be prescribed for patient with aortic valve disease, coronary artery disease status post bypass. His metoprolol as of rhythm control drug and blood pressure medication has helped to control his headaches and should be continued therefore. I would as only nonvascular tonic medications if needed to improve prevention. My goal would be to reduce his headache frequency for migraines to 1 or 2 a month- and if he would need any kind of narcotic medications as infrequently, I have no problems to maintain him on Fiorinal.      Plan:  Treatment plan and additional workup :   1) zonisamide to be addded for Migraine prevention, 2) sleep study with Co2. .  fiorinol #30 for 3 month      Asencion Partridge Marce Schartz MD  05/07/2016   CC: Debbrah Alar, [address] [place],  [id number]

## 2016-05-07 NOTE — Patient Instructions (Signed)

## 2016-05-07 NOTE — Addendum Note (Signed)
Addended by: Larey Seat on: 05/07/2016 03:15 PM   Modules accepted: Orders

## 2016-05-16 ENCOUNTER — Other Ambulatory Visit: Payer: Self-pay | Admitting: Cardiology

## 2016-05-17 DIAGNOSIS — F411 Generalized anxiety disorder: Secondary | ICD-10-CM | POA: Diagnosis not present

## 2016-05-22 DIAGNOSIS — I10 Essential (primary) hypertension: Secondary | ICD-10-CM | POA: Diagnosis not present

## 2016-05-22 DIAGNOSIS — E7800 Pure hypercholesterolemia, unspecified: Secondary | ICD-10-CM | POA: Diagnosis not present

## 2016-05-22 DIAGNOSIS — I712 Thoracic aortic aneurysm, without rupture: Secondary | ICD-10-CM | POA: Diagnosis not present

## 2016-05-22 DIAGNOSIS — Z951 Presence of aortocoronary bypass graft: Secondary | ICD-10-CM | POA: Diagnosis not present

## 2016-05-22 DIAGNOSIS — I2510 Atherosclerotic heart disease of native coronary artery without angina pectoris: Secondary | ICD-10-CM | POA: Diagnosis not present

## 2016-05-22 DIAGNOSIS — Z79899 Other long term (current) drug therapy: Secondary | ICD-10-CM | POA: Diagnosis not present

## 2016-05-22 DIAGNOSIS — I77810 Thoracic aortic ectasia: Secondary | ICD-10-CM | POA: Diagnosis not present

## 2016-05-22 DIAGNOSIS — Z7982 Long term (current) use of aspirin: Secondary | ICD-10-CM | POA: Diagnosis not present

## 2016-05-22 DIAGNOSIS — R079 Chest pain, unspecified: Secondary | ICD-10-CM | POA: Diagnosis not present

## 2016-05-26 ENCOUNTER — Ambulatory Visit (INDEPENDENT_AMBULATORY_CARE_PROVIDER_SITE_OTHER): Payer: Medicare Other | Admitting: Neurology

## 2016-05-26 DIAGNOSIS — R51 Headache: Secondary | ICD-10-CM

## 2016-05-26 DIAGNOSIS — G43101 Migraine with aura, not intractable, with status migrainosus: Secondary | ICD-10-CM

## 2016-05-26 DIAGNOSIS — G4710 Hypersomnia, unspecified: Secondary | ICD-10-CM | POA: Diagnosis not present

## 2016-05-26 DIAGNOSIS — Z72820 Sleep deprivation: Secondary | ICD-10-CM

## 2016-05-26 DIAGNOSIS — R519 Headache, unspecified: Secondary | ICD-10-CM

## 2016-05-26 DIAGNOSIS — R0683 Snoring: Secondary | ICD-10-CM

## 2016-05-26 DIAGNOSIS — R351 Nocturia: Secondary | ICD-10-CM

## 2016-05-26 DIAGNOSIS — G4733 Obstructive sleep apnea (adult) (pediatric): Secondary | ICD-10-CM

## 2016-05-31 ENCOUNTER — Telehealth: Payer: Self-pay | Admitting: Neurology

## 2016-05-31 NOTE — Telephone Encounter (Signed)
Please call patient : he has severe sleep apnea, and will need to return for CPAP titration. No hypoxemia or hypercapnia-  Migraine and frequent headaches were unexplained, but can be related to sleep deprivation.  I placed CPAP order in EPIC. CD

## 2016-05-31 NOTE — Addendum Note (Signed)
Addended by: Larey Seat on: 05/31/2016 12:49 PM   Modules accepted: Orders

## 2016-05-31 NOTE — Procedures (Signed)
PATIENT'S NAME:  [name], [name]  DOB:      01/15/1946      MR#:    [id number]     DATE OF RECORDING: 05/26/2016 REFERRING M.D.:  Debbrah Alar, NP Study Performed:   Baseline Polysomnogram HISTORY:  Mr. Pitz is a 70 year old male patient with history of hypercholesterolemia ,coronary heart disease, aortic aneurysm, arthritis, Aortic valve regurgitation, migraines, anxiety and hypertension, hearing loss, is status post cardiac bypass surgery in 1998, and cataract surgery, both eyes in 2013 at 2014. He describes insomnia and headaches and wants to see if his headache is correlated with his sleep disturbance. Insomnia begun at age 21. The patient endorsed the Epworth Sleepiness Scale at 2 points.    The patient's weight 158 pounds with a height of 68 (inches), resulting in a BMI of 24.1 kg/m2. The patient's neck circumference measured 15 inches.  CURRENT MEDICATIONS: Norvasc, Vitamin C, Aspirin, Neurontin, Cozaar, Toprol-XL, Centrum adults, Niacin, Pravachol, Cialis, Turmeric, Vitamin E   PROCEDURE:  This is a multichannel digital polysomnogram utilizing the Somnostar 11.2 system.  Electrodes and sensors were applied and monitored per AASM Specifications.   EEG, EOG, Chin and Limb EMG, were sampled at 200 Hz.  ECG, Snore and Nasal Pressure, Thermal Airflow, Respiratory Effort, CPAP Flow and Pressure, Oximetry was sampled at 50 Hz. Digital video and audio were recorded.      BASELINE STUDY Lights Out was at 22:21 and Lights On at 05:07.  Total recording time (TRT) was 406 minutes, with a total sleep time (TST) of only 166 minutes.   The patient's sleep latency was 161 minutes.  REM latency was 0 minutes.  The sleep efficiency was 40.9 %. WASO (Wake after sleep onset) was 152.5 minutes.  There were 32.5 minutes in Stage N1, 121 minutes Stage N2, 12.5 minutes Stage N3 and 0 minutes in Stage REM.  The percentage of Stage N1 was 19.6%, Stage N2 was 72.9%, Stage N3 was 7.5% and Stage R (REM sleep) was 0%.    RESPIRATORY ANALYSIS:  There were a total of 110 respiratory events:  44 obstructive apneas, 25 central apneas and 14 mixed apneas with a total of 83 apneas and an apnea index (AI) of 30.2 /hour. There were 27 hypopneas with a hypopnea index of 9.8 /hour. The patient also had 0 respiratory event related arousals (RERAs). The total APNEA/HYPOPNEA INDEX (AHI) was 39.8/hour.  0 events occurred in REM sleep and 93 events in NREM. The REM AHI was 0 /hr, versus a non-REM AHI of 39.8/hr.. The patient spent 139.5 minutes of total sleep time in the supine position and 27 minutes in non-supine.. The supine AHI was 33.1/hr. versus a non-supine AHI of 74.7/hr.  OXYGEN SATURATION & C02:  The Wake baseline 02 saturation was 98%, with the lowest being 89%. Time spent below 89% saturation equaled 0 minutes.  PERIODIC LIMB MOVEMENTS:  The patient had a total of 408 Limb Movements.  The Periodic Limb Movement (PLM) index was 47.5 and the PLM Arousal index was 4.0/hour.  EKG remained in sinus rhythm.    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  Snoring was mild.  PLMs were contributing to sleep interruption.  No clinically significant hypoxemia or hypercapnia was recorded.  Insomnia was clearly present as the patient struggled to go to sleep and maintain sleep.    RECOMMENDATIONS:  1. Advise full-night, attended, CPAP titration study to optimize therapy.   2.  There were moderate  periodic limb movements of sleep (PLMS) with associated sleep disruption.  Consider treating the PLMS primarily if they do not respond to apneas treatment.   3. Further information regarding OSA may be obtained from [employer] (www.sleepfoundation.org) or American Sleep Apnea Association (www.sleepapnea.org). 4. Consider dedicated sleep psychology referral if insomnia remains  of clinical concern after CPAP initiation.   5. A follow up appointment  will be scheduled in the Sleep Clinic at [facility]. The referring provider will be notified of the results.      I certify that I have reviewed the entire raw data recording prior to the issuance of this report in accordance with the Standards of Accreditation of the American Academy of Sleep Medicine (AASM)     Larey Seat, MD  05-30-2016 Diplomat, American Board of Psychiatry and Neurology  Diplomat, American Board of [place] Director, [employer] [employer]

## 2016-06-01 ENCOUNTER — Encounter: Payer: Self-pay | Admitting: Neurology

## 2016-06-03 ENCOUNTER — Telehealth: Payer: Self-pay

## 2016-06-03 DIAGNOSIS — F411 Generalized anxiety disorder: Secondary | ICD-10-CM | POA: Diagnosis not present

## 2016-06-03 NOTE — Telephone Encounter (Signed)
I called pt to discuss sleep study results. No answer, left a message asking pt to call me back.  I received a mychart message from the pt from 06/01/2106 that describes pt's diagnosis of osa as a "DEATH SENTENCE" and that cpap makes people look like a "franeknstein freak" and how he is concerned that no women will sleep with a freak. Pt also says "I GIVE UP".   I have made Dr. Brett Fairy aware of this message through a conversation with her and I forwarded the specific mychart message for her to review.

## 2016-06-03 NOTE — Telephone Encounter (Signed)
See other telephone note from 06/03/2016.

## 2016-06-03 NOTE — Telephone Encounter (Signed)
Dear [name], Please,  let's meet and discuss the sleep study results in person. OSA is a medical condition and my job is to treat it - with you and for you, not against you- , but in a way that you can tolerate and adhere to.   It was not my intention to make you upset, but the sleep study data and results are facts. What to do about it-, that part is to be discussed.   [name]

## 2016-06-03 NOTE — Telephone Encounter (Signed)
Pt returned my call. He said that he was "disturbed" by his sleep study results because he has had 3 wives and at least 3 girlfriends who all never told him that he snores and he never thought he had sleep apnea.I advised him that his sleep study revealed complex sleep apnea, mostly obstructive, at a severe degree.  I offered him a follow up appt (as Dr. Brett Fairy suggested) to discuss sleep study results. Pt is agreeable to this. An appt was made for 06/05/2016 at 8:00am. Pt verbalized understanding of results. Pt had no questions at this time but was encouraged to call back if questions arise.

## 2016-06-05 ENCOUNTER — Encounter: Payer: Self-pay | Admitting: Neurology

## 2016-06-05 ENCOUNTER — Ambulatory Visit (INDEPENDENT_AMBULATORY_CARE_PROVIDER_SITE_OTHER): Payer: Medicare Other | Admitting: Neurology

## 2016-06-05 VITALS — BP 154/82 | HR 69 | Resp 20 | Ht 68.0 in | Wt 160.0 lb

## 2016-06-05 DIAGNOSIS — G4731 Primary central sleep apnea: Secondary | ICD-10-CM

## 2016-06-05 DIAGNOSIS — I2510 Atherosclerotic heart disease of native coronary artery without angina pectoris: Secondary | ICD-10-CM | POA: Diagnosis not present

## 2016-06-05 DIAGNOSIS — I2583 Coronary atherosclerosis due to lipid rich plaque: Secondary | ICD-10-CM

## 2016-06-05 MED ORDER — ALPRAZOLAM 0.25 MG PO TABS: 0.2500 mg | ORAL_TABLET | Freq: Every evening | ORAL | 0 refills | Status: DC | PRN

## 2016-06-05 NOTE — Addendum Note (Signed)
Addended by: Larey Seat on: 06/05/2016 08:24 AM   Modules accepted: Orders

## 2016-06-05 NOTE — Progress Notes (Signed)
SLEEP MEDICINE CLINIC   Provider:  Larey Seat, M D  Referring Provider: Ara Kussmaul, MD Primary Care Physician:  Ara Kussmaul, MD  Chief Complaint  Patient presents with  . Follow-up    sleep study results    HPI:  [name] is a 70 y.o. male , seen here as a referral from Dr. Kerney Elbe -Larena Glassman, MD at [facility] for an evaluation,  [name] has a history of hypercholesterolemia of heart disease, migraines, anxiety and hypertension he had undergone a cardiac bypass surgery in 1998 at a rather young age, and most recently had surgery for the replacement of cloudy lenses, both eyes had cataract surgery in 2013 at 2014. He describes insomnia and headaches as well as anxiety and hearing loss. He is to establish himself as a new neurologist and to see if his headache history is correlated with his sleep disturbance.  He has an aortic aneurysm, arthritis, Aortic valve regurgitation.   The patient reports that headaches prevent him from going to sleep but they do not wake him up and then not always present when he wakes up. He was originally diagnosed in college with migrainous headaches 50 years ago, these headaches are intense, sometimes associated with a visual aura, almost always nausea to the level of emesis was present. Migraines may have lasted for longer than 24 hours up to 3 days in its worst kind. He had photophobia, phonophobia but no olfactory triggers. He was treated with powerful narcotics, such as Talwin, Mepergan forte, Percodan, Percocet and nasal sprays . He would have as many as 15 migraines with hospital admission per semester, affecting his social and professional life. After his bypass surgery in 1998 he experienced a significant reduction in migraine frequency but not intensity. A beta blocker had been prescribed and seemed to reduce his headache frequency. He was switched to Fiorinal, and has been happy with the fact to this day. For 25 years he has  been able to control his migraines and stay out of the emergency room, needing no infusions or injections. He has kept a daily record of his headaches he takes Excedrin for tension headache and fewer note this codeine for migraines only. He had a migraine on August 1, the 11th, August 27 and 28th, and apparently found relief with Fiorinal. He has never overused the  medication, never requested early refills. He takes on average 5 -7 fiorinal per month.   Sleep habits are as follows:  He used to have nocturia times 5, ,but after implementing Cialis 5 mg daily use he only experienced this 1-2 nightly.  Bedtime is late-, between 11 PM and midnight . He falls asleep within 10-15 minutes. He uses Trazodone, melatonin without much success.  He frequently wakes up after 4 hours of sleep sometimes 5, 50% of the nights he cannot continue and return to sleep and will and right there. He has to rise in the morning at about 6:30 AM. This allows for only 6-1/2 hours of sleep. It has been a long-standing habit of his to go to bed at bedtime and rise at that time. Some nights he will only get 4 hours of sleep. His bedroom is described as cool, quiet and dark. He sleeps in supine position, and uses just one pillow for head support. Waking up in the morning is only on occasion associated with a headache, normally no dry mouth nor diaphoresis palpitations. He does not report any nightmares. He is usually not woken  by pain of any kind. He does not suffer from gastroesophageal acid reflux.    Sleep medical history and family sleep history:  Both parents were insomniacs. One brother, too. He was a good sleeper during his childhood and high school years.   Social history:  Divorced, he quit in 1988 after 20 pack years. Has not used tobacco in any form since. 3-5 alcoholic beverages a week. Caffeine use he drinks a lot of Coca-Cola and diet sodas. About 6 a day, no tea or coffee.  Retired from city/ urban  planning.     Interval history from 06/05/2016 [name] returns after his recent sleep study which took place on 05/26/2016 listed as referral PCP is  Debbrah Alar, N practitioner .  The patient's sleep study revealed an AHI of 39.8 there was no REM sleep noted he had complex apnea, consisting of 44 obstructive apneas and 25 centrals and 14 mixed apneas in supine his apnea was not stronger than in nonsupine position he did not have significant oxygen desaturations or hypercapnia. I have recommended to return for a full night CPAP titration which we discussed today. [name]. is concerned that CPAP will be a burden on him and his social life. I assured him that we should try CPAP first and he may have pleasant surprise actually enjoy sleeping with a device. If he is unable to tolerate CPAP he can surely try a dental device but it is not likely as successful as the CPAP in alleviating apnea.  History of Present Illness:  [name] is a 70 y.o. male with a history of CAD with CABG in 1998 after presenting with USAP.  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  He is followed by Dr. Clementeen Graham at [facility].   He has mild carotid artery stenosis on dopplers.  He has a history of dyslipidemia.  He has a history of mild renal insufficiency felt secondary to hypertensive nephrosclerosis.  He denies any chest pain or pressure, SOB, DOE, dizziness, palpitations, claudication or syncope.  He occasionally has some pedal edema if he eats too much salt in his diet.     Review of Systems: Out of a complete 14 system review, the patient complains of only the following symptoms, and all other reviewed systems are negative. Migraine, tension headaches, insomnia with early morning awakening.   Epworth score  2 , Fatigue severity score 26  , geriatric  depression score 1/15    Social History   Social History  . Marital status: Divorced    Spouse  name: N/A  . Number of children: N/A  . Years of education: N/A   Occupational History  . Not on file.   Social History Main Topics  . Smoking status: Former Smoker    Years: 20.00    Quit date: 07/22/1986  . Smokeless tobacco: Never Used  . Alcohol use Yes     Comment: 3-4 weekly  . Drug use: No  . Sexual activity: Not on file   Other Topics Concern  . Not on file   Social History Narrative   Divorced   Secondary school teacher- retired   No children   Has a Neurosurgeon named [name]   Enjoys outdoor activities- hiking, swimming, archeology, baseball, writing, reading, cooking    Family History  Problem Relation Age of Onset  . Arthritis Mother     deceased  . Hyperlipidemia Mother   .  Arthritis Father   . Hyperlipidemia Father   . Heart disease Father 41  . Stroke Father 68    deceased  . Hypertension Father   . Kidney disease Paternal Grandfather     Past Medical History:  Diagnosis Date  . Arthritis   . BPH (benign prostatic hyperplasia)   . Depression   . Dilated aortic root (HCC)    25mm by echo 03/2016  . History of blood transfusion 1998   "due to cardiac bypass surgery"  . Hyperlipidemia   . Hypertension   . Hypogonadism in male   . Migraine   . Retinal hemorrhage 11/04/2014    Past Surgical History:  Procedure Laterality Date  . CORONARY ARTERY BYPASS GRAFT  1998  . TEE WITHOUT CARDIOVERSION N/A 01/19/2015   Procedure: TRANSESOPHAGEAL ECHOCARDIOGRAM (TEE);  Surgeon: Sueanne Margarita, MD;  Location: [facility] ENDOSCOPY;  Service: Cardiovascular;  Laterality: N/A;  . VARICOSE VEIN SURGERY  1984    Current Outpatient Prescriptions  Medication Sig Dispense Refill  . amLODipine (NORVASC) 10 MG tablet TAKE ONE TABLET BY MOUTH DAILY 30 tablet 11  . Ascorbic Acid (VITAMIN C) 1000 MG tablet Take 1,000 mg by mouth 2 (two) times daily.    Marland Kitchen aspirin EC 81 MG tablet Take 81 mg by mouth daily.    . butalbital-aspirin-caffeine-codeine (FIORINAL/CODEINE #3) 50-325-40-30 MG capsule Take  1 capsule by mouth every 4 (four) hours as needed for pain. 30 capsule 1  . gabapentin (NEURONTIN) 300 MG capsule Take 300 mg by mouth at bedtime.    Marland Kitchen losartan (COZAAR) 25 MG tablet TAKE 1 TABLET (25 MG TOTAL) BY MOUTH DAILY. 30 tablet 6  . metoprolol succinate (TOPROL-XL) 100 MG 24 hr tablet TAKE 1 TABLET (100 MG TOTAL) BY MOUTH DAILY. TAKE WITH OR IMMEDIATELY FOLLOWING A MEAL. 30 tablet 11  . Multiple Vitamins-Minerals (CENTRUM ADULTS PO) Take 1 tablet by mouth daily.    . niacin 500 MG tablet Take 500 mg by mouth at bedtime.    . pravastatin (PRAVACHOL) 20 MG tablet TAKE 1 TABLET BY MOUTH EVERY DAY 30 tablet 6  . tadalafil (CIALIS) 5 MG tablet Take 5 mg by mouth daily as needed for erectile dysfunction.    . Testosterone (FORTESTA TD) Place 2 Act onto the skin daily. 1.62%    . TURMERIC PO Take by mouth.    . vitamin E 400 UNIT capsule Take 400 Units by mouth daily.    Marland Kitchen zonisamide (ZONEGRAN) 25 MG capsule Take 1 capsule (25 mg total) by mouth daily. 30 capsule 5   No current facility-administered medications for this visit.     Allergies as of 06/05/2016 - Review Complete 06/05/2016  Allergen Reaction Noted  . Mushroom extract complex  09/21/2014  . Propoxyphene Nausea And Vomiting 01/20/2015    Vitals: BP (!) 154/82   Pulse 69   Resp 20   Ht 5\' 8"  (1.727 m)   Wt 160 lb (72.6 kg)   BMI 24.33 kg/m  Last Weight:  Wt Readings from Last 1 Encounters:  06/05/16 160 lb (72.6 kg)   [id number] mass index is 24.33 kg/m.     Last Height:   Ht Readings from Last 1 Encounters:  06/05/16 5\' 8"  (1.727 m)    Physical exam:  General: The patient is awake, alert and appears not in acute distress. The patient is well groomed. Head: Normocephalic, atraumatic. Neck is supple. Mallampati 2 ,  Dentures partial  neck circumference:15. Nasal airflow patent , TMJ not  evident. Retrognathia is seen.  Cardiovascular:  Regular rate and rhythm , without murmurs or carotid bruit, and without  distended neck veins. Respiratory: Lungs are clear to auscultation. Skin:  Without evidence of edema, or rash Trunk: BMI is normal. The patient's posture is erect   Neurologic exam : The patient is awake and alert, oriented to place and time.    Attention span & concentration ability appears impaired  Speech is fluent, without dysarthria, dysphonia or aphasia.  Mood and affect are appropriate.  Cranial nerves: Pupils are equal and briskly reactive to light. Funduscopic exam without evidence of pallor or edema.  Extraocular movements  in vertical and horizontal planes intact and without nystagmus. Visual fields by finger perimetry are intact. Hearing impaired.  Facial sensation intact to fine touch.  Facial motor strength is symmetric and tongue and uvula move midline. Shoulder shrug was symmetrical.    The patient was advised of the nature of the diagnosed sleep disorder , the treatment options and risks for general a health and wellness arising from not treating the condition.  I spent more than 45 minutes of face to face time with the patient. Greater than 50% of time was spent in counseling and coordination of care. We have discussed the diagnosis and differential and I answered the patient's questions.     Assessment:  After physical and neurologic examination, review of laboratory studies,  Personal review of imaging studies, reports of other /same  Imaging studies ,  Results of polysomnography/ neurophysiology testing and pre-existing records as far as provided in visit., my assessment is   1)  insomnia begun around age 10 , possibly related to divorce. His insomnia has always been manifesting as an early morning awakening with difficulties to continue the night and not so much as an inability to fall asleep in the first place. This is often seen in relationship to anxiety and depression but can also have organic reasons. He had 4 years severe nocturia was up to 5 bathroom breaks at  night, fragmenting his sleep. This has only over the last 2 years been improved with the help of his urologist and is now reduced to 1 or 2 bathroom breaks at night. There is a chronic component to his insomnia but I would like for him to have an attended sleep study to see if he suffers from any organic contributory reasons.   2) He has been diagnosed with complex sleep apnea and mild - moderate PLms, No Co2 retention, no hypoxemia that would explain the headaches.  3) he has a mixed headache type, most frequently suffering from tension headaches which respond to nonsteroidals, but more infrequently this days from migraine headaches which require Excedrin or Fiorinal. He assured me that over the last almost 25 years that he has been taking the medication he has never abused it. He has never had a sleep study so possible contributory factors to headaches such as hypoxemia at night or hypercapnia has never been evaluated.  4)The patient has an extensive cardiovascular health history, trip times cannot be prescribed for patient with aortic valve disease, coronary artery disease status post bypass. His metoprolol as of rhythm control drug and blood pressure medication has helped to control his headaches and should be continued - needed to improve prevention. We were able to reduce his headache frequency for migraines to 1 or 2 a month- on zonisamide., I have no problems to maintain him on Fiorinal. He brought his Fiorinal prescription to today's appointment and  he has taken 8 pills since October 17.      Plan:  Treatment plan and additional workup : 0) return for full night CPAP study, gentle titration in complex apnea, small interface.  1) zonisamide to be continued  for Migraine prevention,         Larey Seat MD  06/05/2016   CC: Ara Kussmaul, [place],  [phone]

## 2016-06-05 NOTE — Patient Instructions (Signed)

## 2016-06-07 DIAGNOSIS — F411 Generalized anxiety disorder: Secondary | ICD-10-CM | POA: Diagnosis not present

## 2016-06-10 ENCOUNTER — Telehealth: Payer: Self-pay

## 2016-06-10 NOTE — Telephone Encounter (Signed)
Patient came for mask fitting. He is a mouth breather but put him on an airfit P10 medium to show him the simplicity if he could adjust to keeping his mouth closed. Fitted him with F&P simplus medium. He did well with this mask. Had a 6 leak. He would like to start with nasal pillows fist when he comes for titration. If he has trouble tech can change him to full face. Told him to call me if he has trouble once set up at home.

## 2016-06-17 ENCOUNTER — Encounter: Payer: Self-pay | Admitting: Neurology

## 2016-06-17 ENCOUNTER — Telehealth: Payer: Self-pay | Admitting: Neurology

## 2016-06-17 DIAGNOSIS — F411 Generalized anxiety disorder: Secondary | ICD-10-CM | POA: Diagnosis not present

## 2016-06-17 DIAGNOSIS — F5104 Psychophysiologic insomnia: Secondary | ICD-10-CM

## 2016-06-17 MED ORDER — ALPRAZOLAM ER 1 MG PO TB24: 1.0000 mg | ORAL_TABLET | Freq: Every day | ORAL | 2 refills | Status: DC

## 2016-06-17 NOTE — Telephone Encounter (Signed)
Patient saw Amada Jupiter today and told her that Dr. Brett Fairy had given him medication Xanax 2.5 to help him sleep. He reported to Amada Jupiter that he only gets 6 hours sleep each night. Amada Jupiter suggested that Dr. Brett Fairy might use a timed release form of medication. Please call and discuss.

## 2016-06-17 NOTE — Telephone Encounter (Signed)
I will take [name] suggestion and try extended release Xanax.   Unfortunately the smallest produced dose is 1 mg. I will give the patient 30 tablets and hope that we'll give him a little bit more rest at night, I still want him to work with Almyra Free on anxiety, failure of relaxation and other sleep impediment's. CD

## 2016-06-17 NOTE — Addendum Note (Signed)
Addended by: Larey Seat on: 06/17/2016 05:30 PM   Modules accepted: Orders

## 2016-06-18 NOTE — Telephone Encounter (Signed)
PA for xanax 1mg  completed and sent to [facility]. Should have a determination in 3-5 business days.

## 2016-06-18 NOTE — Telephone Encounter (Signed)
I spoke to pt and advised him that his pa for xanax XR 1 mg was denied and he will need to pay out of pocket for this medication. Pt says that it is rather inexpensive anyways and that he does not mind paying out of pocket for it.

## 2016-06-18 NOTE — Telephone Encounter (Signed)
Consider that we followed the advice of his psychologist Miss Amada Jupiter, I would suggest that he will pay out of pocket for the extended release form of xanax   Its primary use is not for a sleep disorder such as insomnia.   I am not sure if Mrs. Mamie Levers has prescription privileges are not, otherwise she can write for this prescription. CD

## 2016-06-18 NOTE — Telephone Encounter (Signed)
Pt was responded to via mychart. RX for xanax faxed to [facility]. Received a receipt of confirmation.

## 2016-06-18 NOTE — Telephone Encounter (Signed)
PA for xanax XR 1mg  was denied by [facility] because the diagnoses associated with this medication (complex sleep apnea, generalized anxiety disorder, and psychophysiological insomnia) are not FDA approved for this medication.  An appeal can be done or pt can pay out of pocket for this medication. Per [email or web address], price for 30 days can run between approximately $16-$34.   How would you like to me to proceed?

## 2016-06-24 ENCOUNTER — Encounter: Payer: Self-pay | Admitting: Cardiology

## 2016-06-24 DIAGNOSIS — Q2381 Bicuspid aortic valve: Secondary | ICD-10-CM | POA: Insufficient documentation

## 2016-06-24 DIAGNOSIS — Q231 Congenital insufficiency of aortic valve: Secondary | ICD-10-CM | POA: Insufficient documentation

## 2016-06-25 ENCOUNTER — Ambulatory Visit (INDEPENDENT_AMBULATORY_CARE_PROVIDER_SITE_OTHER): Payer: Medicare Other | Admitting: Neurology

## 2016-06-25 DIAGNOSIS — G4733 Obstructive sleep apnea (adult) (pediatric): Secondary | ICD-10-CM

## 2016-06-25 DIAGNOSIS — R351 Nocturia: Secondary | ICD-10-CM

## 2016-06-25 DIAGNOSIS — F411 Generalized anxiety disorder: Secondary | ICD-10-CM | POA: Diagnosis not present

## 2016-06-25 DIAGNOSIS — R0683 Snoring: Secondary | ICD-10-CM

## 2016-07-02 ENCOUNTER — Encounter: Payer: Self-pay | Admitting: Neurology

## 2016-07-04 ENCOUNTER — Telehealth: Payer: Self-pay | Admitting: Neurology

## 2016-07-04 DIAGNOSIS — F411 Generalized anxiety disorder: Secondary | ICD-10-CM | POA: Diagnosis not present

## 2016-07-04 DIAGNOSIS — G4733 Obstructive sleep apnea (adult) (pediatric): Secondary | ICD-10-CM

## 2016-07-04 DIAGNOSIS — R351 Nocturia: Secondary | ICD-10-CM

## 2016-07-04 NOTE — Procedures (Signed)
PATIENT'S NAME:  [name], [name]  DOB:      1945-08-16      MR#:    [id number]     DATE OF RECORDING: 06/25/2016 REFERRING M.D.:  Debbrah Alar, NP Study Performed:   CPAP  Titration HISTORY:  Patient's baseline PSG from 05/26/16 showed the total APNEA/HYPOPNEA INDEX (AHI) was 39.8/hour., diagnostic for moderate severe sleep apnea. Hypoxemia was not noted.  Hx: Anxiety, Arthritis, BPH, depression, hyperlipidemia, hypertension, migraine and retinal hemorrhage. Insomnia  CURRENT MEDICATIONS: Norvasc, Vitamin C, Aspirin, Neurontin, Cozaar, Toprol-XL, Centrum adults, Niacin, Pravachol, Cialis, Testosterone, Turmeric, Vitamin E   PROCEDURE:  This is a multichannel digital polysomnogram utilizing the SomnoStar 11.2 system.  Electrodes and sensors were applied and monitored per AASM Specifications.   EEG, EOG, Chin and Limb EMG, were sampled at 200 Hz.  ECG, Snore and Nasal Pressure, Thermal Airflow, Respiratory Effort, CPAP Flow and Pressure, Oximetry was sampled at 50 Hz. Digital video and audio were recorded.      CPAP was initiated at 5 cmH20 with heated humidity per AASM split night standards and pressure was advanced to 7cmH20 because of hypopneas, apneas and desaturations.  At a PAP pressure of 7 cmH20, there was a reduction of the AHI to 0.0 with improvement of the above symptoms of obstructive sleep apnea.    Lights Out was at 21:22 and Lights On at 05:11. Total recording time (TRT) was 470 minutes, with a total sleep time (TST) of 340.5 minutes. The patient's sleep latency was 45 minutes with 2.5 minutes of wake time after sleep onset. REM latency was 73 minutes.  The sleep efficiency was 72.4 %.    SLEEP ARCHITECTURE: WASO (Wake after sleep onset) was 92 minutes.  There were 22 minutes in Stage N1, 277 minutes Stage N2, 0 minutes Stage N3 and 41.5 minutes in Stage REM.  The sleep architecture was notable for REM rebound.  RESPIRATORY ANALYSIS:  There was a total of 1 respiratory event: 0 apneas  and 1 hypopnea with 0 respiratory event related arousals (RERAs).    The total APNEA/HYPOPNEA INDEX  (AHI) was .2 /hour and the total RESPIRATORY DISTURBANCE INDEX was .2 .hour  1 events occurred in REM sleep and 0 events in NREM. The REM AHI was 1.4 /hour versus a non-REM AHI of 0 /hour.  The patient spent 250 minutes of total sleep time in the supine position and 91 minutes in non-supine. The supine AHI was 0.2, versus a non-supine AHI of 0.0.  OXYGEN SATURATION & C02:  The baseline 02 saturation was 98%, with the lowest being 90%. Time spent below 89% saturation equaled 0 minutes.  PERIODIC LIMB MOVEMENTS:   The patient had a Periodic Limb Movement (PLM) index of 21.1 and the PLM Arousal index was 0.4 /hour.  There were PVCs and irregular beats noted during sleep EKG.    DIAGNOSIS 1. Obstructive sleep apnea contributed to interruption and fragmentation of sleep. The treatment pressure of 7 cm water corrected Snoring and Hyopoxemia, but not insomnia.  2. Sleep continuity improved marginally   PLANS/RECOMMENDATIONS:  CPAP at 7 cm water with 1 cm EPR and used a FFM Simplus, in Medium size , by [employer]. Mr Merlos will have  a RV appointment for compliance check. A minimum CPAP use time of 4 hours daily is baseline for good compliance.    A follow up appointment will be scheduled in the Sleep Clinic at [facility].   Please call [phone] with any questions.  I certify that I have reviewed the entire raw data recording prior to the issuance of this report in accordance with the Standards of Accreditation of the American Academy of Sleep Medicine (AASM)      Larey Seat, M.D. 12-14- 2017 Diplomat, American Board of Psychiatry and Neurology  Diplomat, Tax adviser of Sleep Medicine Market researcher, [employer] [facility] at [employer]

## 2016-07-04 NOTE — Telephone Encounter (Signed)
I spoke to patient and he is willing to start treatment. He wanted to check out some DME companies including one he saw a commercial on. He will call us back next week and let us know what he decides.

## 2016-07-04 NOTE — Telephone Encounter (Signed)
Beverlee Nims, I have put the CPAP order in. CD

## 2016-07-04 NOTE — Telephone Encounter (Signed)
-----   Message from Larey Seat, MD sent at 07/04/2016  1:39 PM EST ----- DIAGNOSIS 1. Obstructive sleep apnea contributed to interruption and fragmentation of sleep. The treatment pressure of 7 cm water corrected Snoring and Hyopoxemia, but not insomnia.  2. Sleep continuity improved marginally   PLANS/RECOMMENDATIONS:  CPAP at 7 cm water with 1 cm EPR using a FFM Simplus in Medium size , by [employer]. Mr Ifill will have  a RV appointment for compliance check. A minimum CPAP use time of 4 hours daily is baseline for good compliance.    A follow up appointment will be scheduled in the Sleep Clinic at [facility].   Please call [phone] with any questions.

## 2016-07-11 DIAGNOSIS — F411 Generalized anxiety disorder: Secondary | ICD-10-CM | POA: Diagnosis not present

## 2016-07-11 NOTE — Telephone Encounter (Signed)
Patient called back. States that he will start treatment. I will send orders to [facility]. Patient will also get a letter reminding him to make f/u appt and stress the importance of compliance.

## 2016-07-18 ENCOUNTER — Encounter: Payer: Self-pay | Admitting: Neurology

## 2016-07-18 ENCOUNTER — Encounter: Payer: Self-pay | Admitting: Cardiology

## 2016-07-18 NOTE — Telephone Encounter (Signed)
FYI

## 2016-07-31 DIAGNOSIS — F411 Generalized anxiety disorder: Secondary | ICD-10-CM | POA: Diagnosis not present

## 2016-08-12 DIAGNOSIS — R351 Nocturia: Secondary | ICD-10-CM | POA: Diagnosis not present

## 2016-08-12 DIAGNOSIS — N401 Enlarged prostate with lower urinary tract symptoms: Secondary | ICD-10-CM | POA: Diagnosis not present

## 2016-08-12 DIAGNOSIS — E291 Testicular hypofunction: Secondary | ICD-10-CM | POA: Diagnosis not present

## 2016-08-12 DIAGNOSIS — N529 Male erectile dysfunction, unspecified: Secondary | ICD-10-CM | POA: Diagnosis not present

## 2016-08-23 ENCOUNTER — Encounter: Payer: Self-pay | Admitting: Neurology

## 2016-08-23 ENCOUNTER — Encounter (HOSPITAL_COMMUNITY): Payer: Self-pay | Admitting: Radiology

## 2016-08-23 ENCOUNTER — Encounter: Payer: Self-pay | Admitting: Cardiology

## 2016-08-25 ENCOUNTER — Other Ambulatory Visit: Payer: Self-pay | Admitting: Cardiology

## 2016-08-27 ENCOUNTER — Other Ambulatory Visit: Payer: Self-pay | Admitting: *Deleted

## 2016-08-27 MED ORDER — LOSARTAN POTASSIUM 25 MG PO TABS: ORAL_TABLET | ORAL | 1 refills | Status: DC

## 2016-09-11 DIAGNOSIS — F411 Generalized anxiety disorder: Secondary | ICD-10-CM | POA: Diagnosis not present

## 2016-09-12 ENCOUNTER — Encounter: Payer: Self-pay | Admitting: Cardiology

## 2016-09-18 DIAGNOSIS — F411 Generalized anxiety disorder: Secondary | ICD-10-CM | POA: Diagnosis not present

## 2016-09-20 DIAGNOSIS — Y991 Military activity: Secondary | ICD-10-CM | POA: Diagnosis not present

## 2016-09-20 DIAGNOSIS — W429XXA Exposure to other noise, initial encounter: Secondary | ICD-10-CM | POA: Diagnosis not present

## 2016-09-20 DIAGNOSIS — H833X3 Noise effects on inner ear, bilateral: Secondary | ICD-10-CM | POA: Diagnosis not present

## 2016-09-20 DIAGNOSIS — H9313 Tinnitus, bilateral: Secondary | ICD-10-CM | POA: Diagnosis not present

## 2016-09-20 DIAGNOSIS — H903 Sensorineural hearing loss, bilateral: Secondary | ICD-10-CM | POA: Diagnosis not present

## 2016-09-20 DIAGNOSIS — Z87891 Personal history of nicotine dependence: Secondary | ICD-10-CM | POA: Diagnosis not present

## 2016-09-22 ENCOUNTER — Other Ambulatory Visit: Payer: Self-pay | Admitting: Neurology

## 2016-09-22 DIAGNOSIS — F411 Generalized anxiety disorder: Secondary | ICD-10-CM

## 2016-09-22 DIAGNOSIS — F5104 Psychophysiologic insomnia: Secondary | ICD-10-CM

## 2016-09-23 NOTE — Telephone Encounter (Signed)
RX for xanax faxed to Kristopher Oppenheim at [facility]. Received a receipt of confirmation.

## 2016-09-25 DIAGNOSIS — F411 Generalized anxiety disorder: Secondary | ICD-10-CM | POA: Diagnosis not present

## 2016-09-27 ENCOUNTER — Encounter: Payer: Self-pay | Admitting: Neurology

## 2016-09-30 ENCOUNTER — Telehealth: Payer: Self-pay

## 2016-09-30 NOTE — Telephone Encounter (Signed)
I called pt to r/s his appt for 10/01/2016 due to the office being closed for weather concerns. No answer, so I left a detailed message on his home number per DPR advising him of this information and that I will call him when our office re-opens to get him rescheduled.

## 2016-10-01 ENCOUNTER — Ambulatory Visit: Payer: Medicare Other | Admitting: Neurology

## 2016-10-01 NOTE — Telephone Encounter (Signed)
I called pt. I spent an extended amount of time working with him trying to get his appt rescheduled. I offered him several appts this week but he cannot make those appts. He finally settled on an appt on 10/10/2016 at 10:00am. He has to be seen within 30-90 days within starting cpap for insurance purposes. Pt verbalized understanding of new appt date and time.
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
## 2016-10-02 DIAGNOSIS — F411 Generalized anxiety disorder: Secondary | ICD-10-CM | POA: Diagnosis not present

## 2016-10-02 DIAGNOSIS — F3342 Major depressive disorder, recurrent, in full remission: Secondary | ICD-10-CM | POA: Diagnosis not present

## 2016-10-10 ENCOUNTER — Ambulatory Visit (INDEPENDENT_AMBULATORY_CARE_PROVIDER_SITE_OTHER): Payer: Medicare Other | Admitting: Neurology

## 2016-10-10 ENCOUNTER — Encounter: Payer: Self-pay | Admitting: Neurology

## 2016-10-10 VITALS — BP 137/78 | HR 63 | Resp 20 | Ht 68.0 in | Wt 163.0 lb

## 2016-10-10 DIAGNOSIS — Z9989 Dependence on other enabling machines and devices: Secondary | ICD-10-CM

## 2016-10-10 DIAGNOSIS — F411 Generalized anxiety disorder: Secondary | ICD-10-CM

## 2016-10-10 DIAGNOSIS — R519 Headache, unspecified: Secondary | ICD-10-CM

## 2016-10-10 DIAGNOSIS — F5104 Psychophysiologic insomnia: Secondary | ICD-10-CM | POA: Diagnosis not present

## 2016-10-10 DIAGNOSIS — G4733 Obstructive sleep apnea (adult) (pediatric): Secondary | ICD-10-CM | POA: Diagnosis not present

## 2016-10-10 DIAGNOSIS — R51 Headache: Secondary | ICD-10-CM

## 2016-10-10 MED ORDER — BUTALBITAL-ASA-CAFF-CODEINE 50-325-40-30 MG PO CAPS: 1.0000 | ORAL_CAPSULE | ORAL | 2 refills | Status: DC | PRN

## 2016-10-10 MED ORDER — ALPRAZOLAM ER 1 MG PO TB24: 1.0000 mg | ORAL_TABLET | Freq: Every day | ORAL | 3 refills | Status: DC

## 2016-10-10 MED ORDER — ZONISAMIDE 25 MG PO CAPS: 25.0000 mg | ORAL_CAPSULE | Freq: Every day | ORAL | 5 refills | Status: DC

## 2016-10-10 NOTE — Progress Notes (Signed)
SLEEP MEDICINE CLINIC   Provider:  Larey Seat, M D  Referring Provider: Ara Kussmaul, MD Primary Care Physician:  Ara Kussmaul, MD  Chief Complaint  Patient presents with  . Follow-up    cpap    HPI:  [name] is a 71 y.o. male , seen here as a referral from Dr. Kerney Elbe -Larena Glassman, MD at [facility] for an evaluation,  [name] has a history of hypercholesterolemia of heart disease, migraines, anxiety and hypertension he had undergone a cardiac bypass surgery in 1998 at a rather young age, and most recently had surgery for the replacement of cloudy lenses, both eyes had cataract surgery in 2013 at 2014. He describes insomnia and headaches as well as anxiety and hearing loss. He is to establish himself as a new neurologist and to see if his headache history is correlated with his sleep disturbance.  He has an aortic aneurysm, arthritis, Aortic valve regurgitation.  The patient reports that headaches prevent him from going to sleep but they do not wake him up and then not always present when he wakes up. He was originally diagnosed in college with migrainous headaches 50 years ago, these headaches are intense, sometimes associated with a visual aura, almost always nausea to the level of emesis was present. Migraines may have lasted for longer than 24 hours up to 3 days in its worst kind. He had photophobia, phonophobia but no olfactory triggers.He was treated with powerful narcotics, such as Talwin, Mepergan forte, Percodan, Percocet and nasal sprays . He would have as many as 15 migraines with hospital admission per semester, affecting his social and professional life. After his bypass surgery in 1998 he experienced a significant reduction in migraine frequency but not intensity. A beta blocker had been prescribed and seemed to reduce his headache frequency. He was switched to Fiorinal, and has been happy with the fact to this day. For 25 years he has been able to control  his migraines and stay out of the emergency room, needing no infusions or injections.He has kept a daily record of his headaches he takes Excedrin for tension headache and fewer note this codeine for migraines only. He had a migraine on August 1, the 11th, August 27 and 28th, and apparently found relief with Fiorinal. He has never overused the  medication, never requested early refills. He takes on average 5 -7 fiorinal per month.   Sleep habits are as follows:  He used to have nocturia times 5, ,but after implementing Cialis 5 mg daily use he only experienced this 1-2 nightly.  Bedtime is late-, between 11 PM and midnight . He falls asleep within 10-15 minutes. He uses Trazodone, melatonin without much success.  He frequently wakes up after 4 hours of sleep sometimes 5, 50% of the nights he cannot continue and return to sleep and will and right there. He has to rise in the morning at about 6:30 AM. This allows for only 6-1/2 hours of sleep. It has been a long-standing habit of his to go to bed at bedtime and rise at that time. Some nights he will only get 4 hours of sleep.His bedroom is described as cool, quiet and dark. He sleeps in supine position, and uses just one pillow for head support. Waking up in the morning is only on occasion associated with a headache, normally no dry mouth nor diaphoresis palpitations. He does not report any nightmares. He is usually not woken by pain of any kind. He  does not suffer from gastroesophageal acid reflux.   Interval history from 06/05/2016 [name] returns after his recent sleep study which took place on 05/26/2016 listed as referral PCP is  Debbrah Alar, N practitioner .  The patient's sleep study revealed an AHI of 39.8 there was no REM sleep noted he had complex apnea, consisting of 44 obstructive apneas and 25 centrals and 14 mixed apneas in supine his apnea was not stronger than in nonsupine position he did not have significant oxygen desaturations or  hypercapnia. I have recommended to return for a full night CPAP titration which we discussed today. [name]. is concerned that CPAP will be a burden on him and his social life. I assured him that we should try CPAP first and he may have pleasant surprise actually enjoy sleeping with a device. If he is unable to tolerate CPAP he can surely try a dental device but it is not likely as successful as the CPAP in alleviating apnea.  Interval history from 10/10/2016, [name] is doing exceptionally well, he is highly compliant with CPAP use at 7 cm water pressure with 3 cm EPR, his residual AHI is 10.5 little bit too high and I would like to increase his pressure just by a notch. We will go to 9 cm water pressure with 3 cm EPR after today's visit. He has used the machine 8 hours and 18 minutes at night the residual apneas are obstructive in nature. He had undergone a CPAP titration on 06/25/2016 but his residual AHI was 0.2 per hour at 7 cm water pressure. He slept mostly in supine position during that test did not show any hypoxemia or hypercapnia and was fitted with a full face mask in medium size a model called Simplus. He also has seen otolaryngology in the meantime an audiogram was performed on 09/20/2016 and it appears that these are service related injuries to his hearing. These also have promoted tinnitus. Hearing loss with bilateral. He has used sparingly Fiorinal and actually brought me the bottle he had not had a refill since January 1 there are still 5 pills left of the 30 prescribed . He uses about 8 per month. He no longer reports excessive daytime sleepiness and finds restful and prolonged sound sleep with Xanax extended release. This had been recommended by his counselor, but she does not have prescription privileges. I have taken this prescription over.  History of Present Illness:  [name] is a 71 y.o. male with a history of CAD with CABG in 1998 after presenting with USAP.  He has history  of bicuspid AV with moderate to severe AI and dilated aortic root measuring 4.8cm in diameter by TEE 12/2014.  He is followed by Dr. Clementeen Graham at [facility].   He has mild carotid artery stenosis on dopplers.  He has a history of dyslipidemia.  He has a history of mild renal insufficiency felt secondary to hypertensive nephrosclerosis.  He denies any chest pain or pressure, SOB, DOE, dizziness, palpitations, claudication or syncope.  He occasionally has some pedal edema if he eats too much salt in his diet.     Review of Systems: Out of a complete 14 system review, the patient complains of only the following symptoms, and all other reviewed systems are negative. Sleeps more than 6 hours nightly on Xanax, no more headaches in AM, rare need for Fiorinal ( 8 a month ) ,   Epworth score 1 , Fatigue severity score 20  , geriatric  depression score 1/15    Social History   Social History  . Marital status: Divorced    Spouse name: N/A  . Number of children: N/A  . Years of education: N/A   Occupational History  . Not on file.   Social History Main Topics  . Smoking status: Former Smoker    Years: 20.00    Quit date: 07/22/1986  . Smokeless tobacco: Never Used  . Alcohol use Yes     Comment: 3-4 weekly  . Drug use: No  . Sexual activity: Not on file   Other Topics Concern  . Not on file   Social History Narrative   Divorced   Secondary school teacher- retired   No children   Has a Neurosurgeon named [name]   Enjoys outdoor activities- hiking, swimming, archeology, baseball, writing, reading, cooking    Family History  Problem Relation Age of Onset  . Arthritis Mother     deceased  . Hyperlipidemia Mother   . Arthritis Father   . Hyperlipidemia Father   . Heart disease Father 95  . Stroke Father 65    deceased  . Hypertension Father   . Kidney disease Paternal Grandfather     Past Medical History:  Diagnosis Date  . Arthritis   . Bicuspid aortic valve    with mild to moderate AR  . BPH (benign  prostatic hyperplasia)   . CAD (coronary artery disease)    s/p CABG [place]  . Depression   . Dilated aortic root (HCC)    100mm by echo 03/2016  . History of blood transfusion 1998   "due to cardiac bypass surgery"  . Hyperlipidemia   . Hypertension   . Hypogonadism in male   . Migraine   . Retinal hemorrhage 11/04/2014    Past Surgical History:  Procedure Laterality Date  . CORONARY ARTERY BYPASS GRAFT  1998  . TEE WITHOUT CARDIOVERSION N/A 01/19/2015   Procedure: TRANSESOPHAGEAL ECHOCARDIOGRAM (TEE);  Surgeon: Sueanne Margarita, MD;  Location: [facility] ENDOSCOPY;  Service: Cardiovascular;  Laterality: N/A;  . VARICOSE VEIN SURGERY  1984    Current Outpatient Prescriptions  Medication Sig Dispense Refill  . ALPRAZolam (XANAX XR) 1 MG 24 hr tablet TAKE ONE TABLET BY MOUTH DAILY 30 tablet 1  . amLODipine (NORVASC) 10 MG tablet TAKE ONE TABLET BY MOUTH DAILY 30 tablet 11  . Ascorbic Acid (VITAMIN C) 1000 MG tablet Take 1,000 mg by mouth 2 (two) times daily.    Marland Kitchen aspirin EC 81 MG tablet Take 81 mg by mouth daily.    . butalbital-aspirin-caffeine-codeine (FIORINAL/CODEINE #3) 50-325-40-30 MG capsule Take 1 capsule by mouth every 4 (four) hours as needed for pain. 30 capsule 1  . losartan (COZAAR) 25 MG tablet TAKE 1 TABLET (25 MG TOTAL) BY MOUTH DAILY. 90 tablet 1  . metoprolol succinate (TOPROL-XL) 100 MG 24 hr tablet TAKE 1 TABLET (100 MG TOTAL) BY MOUTH DAILY. TAKE WITH OR IMMEDIATELY FOLLOWING A MEAL. 30 tablet 11  . Multiple Vitamins-Minerals (CENTRUM ADULTS PO) Take 1 tablet by mouth daily.    . niacin 500 MG tablet Take 500 mg by mouth at bedtime.    . pravastatin (PRAVACHOL) 20 MG tablet TAKE 1 TABLET BY MOUTH EVERY DAY 30 tablet 6  . tadalafil (CIALIS) 5 MG tablet Take 5 mg by mouth daily as needed for erectile dysfunction.    . Testosterone (FORTESTA TD) Place 2 Act onto the skin daily. 1.62%    . TURMERIC PO  Take by mouth.    . vitamin E 400 UNIT capsule Take 400 Units  by mouth daily.    Marland Kitchen zonisamide (ZONEGRAN) 25 MG capsule Take 1 capsule (25 mg total) by mouth daily. 30 capsule 5   No current facility-administered medications for this visit.     Allergies as of 10/10/2016 - Review Complete 10/10/2016  Allergen Reaction Noted  . Mushroom extract complex  09/21/2014  . Propoxyphene Nausea And Vomiting 01/20/2015    Vitals: BP 137/78   Pulse 63   Resp 20   Ht 5\' 8"  (1.727 m)   Wt 163 lb (73.9 kg)   BMI 24.78 kg/m  Last Weight:  Wt Readings from Last 1 Encounters:  10/10/16 163 lb (73.9 kg)   ZOX:WRUE mass index is 24.78 kg/m.     Last Height:   Ht Readings from Last 1 Encounters:  10/10/16 5\' 8"  (1.727 m)    Physical exam:  General: The patient is awake, alert and appears not in acute distress. The patient is well groomed. Head: Normocephalic, atraumatic. Neck is supple. Mallampati 2 ,  Dentures partial neck circumference:15. Nasal airflow patent , TMJ not evident. Retrognathia is seen.  Cardiovascular:  Regular rate and rhythm , without murmurs or carotid bruit, and without distended neck veins. Respiratory: Lungs are clear to auscultation.Skin:  Without evidence of edema, or rashTrunk: BMI is normal. The patient's posture is erect   Neurologic exam : The patient is awake and alert, oriented to place and time.  Attention span & concentration ability appears impaired Speech is fluent, without dysarthria, dysphonia or aphasia.  Mood and affect are appropriate.  Cranial nerves:  No change in taste or smell. Pupils are equal and briskly reactive to light. Funduscopic exam without evidence of pallor or edema.  Extraocular movements  in vertical and horizontal planes intact and without nystagmus. Visual fields by finger perimetry are intact. Hearing impaired.  Facial sensation intact to fine touch.Facial motor strength is symmetric and tongue and uvula move midline. Shoulder shrug was symmetrical.    The patient was advised of the nature of  the diagnosed sleep disorder , the treatment options and risks for general a health and wellness arising from not treating the condition.  I spent more than 45 minutes of face to face time with the patient. Greater than 50% of time was spent in counseling and coordination of care. We have discussed the diagnosis and differential and I answered the patient's questions.     Assessment:  After physical and neurologic examination, review of laboratory studies,  Personal review of imaging studies, reports of other /same  Imaging studies ,  Results of polysomnography/ neurophysiology testing and pre-existing records as far as provided in visit., my assessment is   1)  insomnia   This part further  improved on CPAP.  There is a chronic component to his insomnia but I would like for him to have an attended sleep study to see if he suffers from any organic contributory reasons.  He has been using XR XANAX and sleeps better- this step was recommended by his counselor who cannot prescribe   2) He has been diagnosed with complex sleep apnea and mild - moderate PLms, No Co2 retention, no hypoxemia that would explain the headaches. He uses CPAP now, compliantly - I will increase his pressure to 9 cm from 7 cm , due to residual OSA above AHI of 5.   3) he had a mixed headache type, most frequently suffering  from tension headaches (which respond to non steroidals), but more infrequently this days from migraine headaches which require Excedrin or Fiorinal. He assured me that over the last almost 25 years that he has been taking the medication  And he has never abused it.  His headaches are much improved after insomnia and apnea have been treated.We were able to reduce his headache frequency for migraines to 1 or 2 a month- on zonisamide.,  I have no problems to maintain him on Fiorinal. He brought his Fiorinal prescription to today's appointment and he has taken 8 pills since July 23 2016.   4)The patient has an  extensive cardiovascular health history, Not a TRIPTAN candidate as he is a patient with aortic valve disease, coronary artery disease status post bypass.    Plan:  Treatment plan and additional workup : Rv in 6 month for refills, compliance. Asencion Partridge Callista Hoh MD  10/10/2016   CC: Ara Kussmaul, [address], [place] [address]

## 2016-10-15 DIAGNOSIS — F411 Generalized anxiety disorder: Secondary | ICD-10-CM | POA: Diagnosis not present

## 2016-10-16 DIAGNOSIS — H35073 Retinal telangiectasis, bilateral: Secondary | ICD-10-CM | POA: Diagnosis not present

## 2016-10-16 DIAGNOSIS — H26491 Other secondary cataract, right eye: Secondary | ICD-10-CM | POA: Diagnosis not present

## 2016-10-16 DIAGNOSIS — Z961 Presence of intraocular lens: Secondary | ICD-10-CM | POA: Diagnosis not present

## 2016-10-23 DIAGNOSIS — F411 Generalized anxiety disorder: Secondary | ICD-10-CM | POA: Diagnosis not present

## 2016-11-06 DIAGNOSIS — F411 Generalized anxiety disorder: Secondary | ICD-10-CM | POA: Diagnosis not present

## 2016-11-12 ENCOUNTER — Ambulatory Visit (HOSPITAL_COMMUNITY): Payer: Medicare Other | Attending: Cardiovascular Disease

## 2016-11-12 ENCOUNTER — Other Ambulatory Visit: Payer: Self-pay

## 2016-11-12 ENCOUNTER — Encounter: Payer: Self-pay | Admitting: Cardiology

## 2016-11-12 DIAGNOSIS — I119 Hypertensive heart disease without heart failure: Secondary | ICD-10-CM | POA: Diagnosis not present

## 2016-11-12 DIAGNOSIS — I351 Nonrheumatic aortic (valve) insufficiency: Secondary | ICD-10-CM | POA: Diagnosis present

## 2016-11-14 DIAGNOSIS — Z85828 Personal history of other malignant neoplasm of skin: Secondary | ICD-10-CM | POA: Diagnosis not present

## 2016-11-14 DIAGNOSIS — L821 Other seborrheic keratosis: Secondary | ICD-10-CM | POA: Diagnosis not present

## 2016-11-14 DIAGNOSIS — A630 Anogenital (venereal) warts: Secondary | ICD-10-CM | POA: Diagnosis not present

## 2016-11-14 DIAGNOSIS — L570 Actinic keratosis: Secondary | ICD-10-CM | POA: Diagnosis not present

## 2016-11-14 DIAGNOSIS — D1801 Hemangioma of skin and subcutaneous tissue: Secondary | ICD-10-CM | POA: Diagnosis not present

## 2016-11-14 DIAGNOSIS — L814 Other melanin hyperpigmentation: Secondary | ICD-10-CM | POA: Diagnosis not present

## 2016-11-14 DIAGNOSIS — D225 Melanocytic nevi of trunk: Secondary | ICD-10-CM | POA: Diagnosis not present

## 2016-11-14 DIAGNOSIS — B078 Other viral warts: Secondary | ICD-10-CM | POA: Diagnosis not present

## 2016-11-18 ENCOUNTER — Encounter: Payer: Self-pay | Admitting: Cardiology

## 2016-11-19 ENCOUNTER — Other Ambulatory Visit: Payer: Self-pay

## 2016-11-19 DIAGNOSIS — I7121 Aneurysm of the ascending aorta, without rupture: Secondary | ICD-10-CM

## 2016-11-19 DIAGNOSIS — I712 Thoracic aortic aneurysm, without rupture: Secondary | ICD-10-CM

## 2016-11-20 DIAGNOSIS — F411 Generalized anxiety disorder: Secondary | ICD-10-CM | POA: Diagnosis not present

## 2016-11-21 DIAGNOSIS — I2510 Atherosclerotic heart disease of native coronary artery without angina pectoris: Secondary | ICD-10-CM | POA: Diagnosis not present

## 2016-11-21 DIAGNOSIS — G4440 Drug-induced headache, not elsewhere classified, not intractable: Secondary | ICD-10-CM | POA: Diagnosis not present

## 2016-11-21 DIAGNOSIS — N400 Enlarged prostate without lower urinary tract symptoms: Secondary | ICD-10-CM | POA: Diagnosis not present

## 2016-11-21 DIAGNOSIS — G43109 Migraine with aura, not intractable, without status migrainosus: Secondary | ICD-10-CM | POA: Diagnosis not present

## 2016-11-21 DIAGNOSIS — E291 Testicular hypofunction: Secondary | ICD-10-CM | POA: Diagnosis not present

## 2016-11-21 DIAGNOSIS — I351 Nonrheumatic aortic (valve) insufficiency: Secondary | ICD-10-CM | POA: Diagnosis not present

## 2016-11-21 DIAGNOSIS — I712 Thoracic aortic aneurysm, without rupture: Secondary | ICD-10-CM | POA: Diagnosis not present

## 2016-11-21 DIAGNOSIS — I10 Essential (primary) hypertension: Secondary | ICD-10-CM | POA: Diagnosis not present

## 2016-11-28 DIAGNOSIS — F411 Generalized anxiety disorder: Secondary | ICD-10-CM | POA: Diagnosis not present

## 2016-12-12 DIAGNOSIS — F411 Generalized anxiety disorder: Secondary | ICD-10-CM | POA: Diagnosis not present

## 2016-12-30 DIAGNOSIS — F411 Generalized anxiety disorder: Secondary | ICD-10-CM | POA: Diagnosis not present

## 2017-01-08 DIAGNOSIS — F411 Generalized anxiety disorder: Secondary | ICD-10-CM | POA: Diagnosis not present

## 2017-01-28 DIAGNOSIS — F411 Generalized anxiety disorder: Secondary | ICD-10-CM | POA: Diagnosis not present

## 2017-02-10 DIAGNOSIS — Z79899 Other long term (current) drug therapy: Secondary | ICD-10-CM | POA: Diagnosis not present

## 2017-02-10 DIAGNOSIS — E291 Testicular hypofunction: Secondary | ICD-10-CM | POA: Diagnosis not present

## 2017-02-10 DIAGNOSIS — N401 Enlarged prostate with lower urinary tract symptoms: Secondary | ICD-10-CM | POA: Diagnosis not present

## 2017-02-10 DIAGNOSIS — R351 Nocturia: Secondary | ICD-10-CM | POA: Diagnosis not present

## 2017-02-20 DIAGNOSIS — F411 Generalized anxiety disorder: Secondary | ICD-10-CM | POA: Diagnosis not present

## 2017-02-27 DIAGNOSIS — I10 Essential (primary) hypertension: Secondary | ICD-10-CM | POA: Diagnosis not present

## 2017-02-27 DIAGNOSIS — E782 Mixed hyperlipidemia: Secondary | ICD-10-CM | POA: Diagnosis not present

## 2017-02-27 DIAGNOSIS — I2510 Atherosclerotic heart disease of native coronary artery without angina pectoris: Secondary | ICD-10-CM | POA: Diagnosis not present

## 2017-02-27 DIAGNOSIS — I712 Thoracic aortic aneurysm, without rupture: Secondary | ICD-10-CM | POA: Diagnosis not present

## 2017-02-27 DIAGNOSIS — F418 Other specified anxiety disorders: Secondary | ICD-10-CM | POA: Diagnosis not present

## 2017-02-27 DIAGNOSIS — G4731 Primary central sleep apnea: Secondary | ICD-10-CM | POA: Diagnosis not present

## 2017-02-27 DIAGNOSIS — Z0000 Encounter for general adult medical examination without abnormal findings: Secondary | ICD-10-CM | POA: Diagnosis not present

## 2017-03-02 ENCOUNTER — Encounter: Payer: Self-pay | Admitting: Cardiology

## 2017-03-02 ENCOUNTER — Encounter: Payer: Self-pay | Admitting: Neurology

## 2017-03-03 DIAGNOSIS — F411 Generalized anxiety disorder: Secondary | ICD-10-CM | POA: Diagnosis not present

## 2017-03-17 DIAGNOSIS — F411 Generalized anxiety disorder: Secondary | ICD-10-CM | POA: Diagnosis not present

## 2017-03-20 ENCOUNTER — Other Ambulatory Visit: Payer: Self-pay | Admitting: Neurology

## 2017-03-20 DIAGNOSIS — F411 Generalized anxiety disorder: Secondary | ICD-10-CM

## 2017-03-20 DIAGNOSIS — F5104 Psychophysiologic insomnia: Secondary | ICD-10-CM

## 2017-04-09 DIAGNOSIS — M79661 Pain in right lower leg: Secondary | ICD-10-CM | POA: Diagnosis not present

## 2017-04-09 DIAGNOSIS — M79662 Pain in left lower leg: Secondary | ICD-10-CM | POA: Diagnosis not present

## 2017-04-11 DIAGNOSIS — F411 Generalized anxiety disorder: Secondary | ICD-10-CM | POA: Diagnosis not present

## 2017-04-14 ENCOUNTER — Encounter: Payer: Self-pay | Admitting: Adult Health

## 2017-04-14 ENCOUNTER — Encounter: Payer: Self-pay | Admitting: Neurology

## 2017-04-14 ENCOUNTER — Ambulatory Visit (INDEPENDENT_AMBULATORY_CARE_PROVIDER_SITE_OTHER): Payer: Medicare Other | Admitting: Adult Health

## 2017-04-14 VITALS — BP 128/68 | HR 72 | Wt 162.8 lb

## 2017-04-14 DIAGNOSIS — Z9989 Dependence on other enabling machines and devices: Secondary | ICD-10-CM

## 2017-04-14 DIAGNOSIS — G4733 Obstructive sleep apnea (adult) (pediatric): Secondary | ICD-10-CM | POA: Diagnosis not present

## 2017-04-14 DIAGNOSIS — F5104 Psychophysiologic insomnia: Secondary | ICD-10-CM | POA: Diagnosis not present

## 2017-04-14 DIAGNOSIS — G43009 Migraine without aura, not intractable, without status migrainosus: Secondary | ICD-10-CM | POA: Diagnosis not present

## 2017-04-14 MED ORDER — ZONISAMIDE 25 MG PO CAPS: 50.0000 mg | ORAL_CAPSULE | Freq: Every day | ORAL | 5 refills | Status: DC

## 2017-04-14 MED ORDER — ALPRAZOLAM ER 1 MG PO TB24: 1.0000 mg | ORAL_TABLET | Freq: Every day | ORAL | 3 refills | Status: DC

## 2017-04-14 NOTE — Progress Notes (Signed)
Valium  Rx faxed to [facility], [place]

## 2017-04-14 NOTE — Progress Notes (Addendum)
PATIENT: [name] DOB: 01/25/1946  REASON FOR VISIT: follow up- obstructive sleep apnea on CPAP, migraine headaches HISTORY FROM: patient  HISTORY OF PRESENT ILLNESS: Today 04/14/17 [name] is a 71 year old male with a history of obstructive sleep apnea on CPAP and migraine headaches. He returns today for follow-up. His compliance report indicates that he uses machine 30 out of 30 days for compliance of 100%. He uses machine greater than 4 hours every night. On average he uses his machine 7 hours and 56 minutes. His residual AHI of 6.2 on 9 cm water with EPR of 3. He does not have a significant leak. Reports that he is tolerating machine well. Reports that this is the best he slept in years. Patient reports that his migraine headaches have also improved. Reports that he was having 15-20 migraines a month and now he is down to 5 headaches a month. He continues on Zonegran 25 mg daily. His headaches are typically in the frontal region and migrate to the back of the neck. He does have photophobia and phonophobia as well as nausea. He does use for your said to treat his migraines. He also takes Xanax at bedtime. Reports that he is seeing a urologist and now he is only getting up at the most 1 time a night to urinate. He returns today for an evaluation.  HISTORY   REVIEW OF SYSTEMS: Out of a complete 14 system review of symptoms, the patient complains only of the following symptoms, and all other reviewed systems are negative.  Headache, hearing loss, ringing in ears, apnea  ALLERGIES: Allergies  Allergen Reactions  . Mushroom Extract Complex     Severe vertigo, nausea, headache  . Propoxyphene Nausea And Vomiting    Headache, vertigo    HOME MEDICATIONS: Outpatient Medications Prior to Visit  Medication Sig Dispense Refill  . ALPRAZolam (XANAX XR) 1 MG 24 hr tablet TAKE ONE TABLET BY MOUTH DAILY 30 tablet 0  . amLODipine (NORVASC) 10 MG tablet TAKE ONE TABLET BY MOUTH DAILY 30  tablet 11  . Ascorbic Acid (VITAMIN C) 1000 MG tablet Take 1,000 mg by mouth 2 (two) times daily.    Marland Kitchen aspirin EC 81 MG tablet Take 81 mg by mouth daily.    . butalbital-aspirin-caffeine-codeine (FIORINAL/CODEINE #3) 50-325-40-30 MG capsule Take 1 capsule by mouth every 4 (four) hours as needed for pain. 30 capsule 2  . losartan (COZAAR) 25 MG tablet TAKE 1 TABLET (25 MG TOTAL) BY MOUTH DAILY. 90 tablet 1  . metoprolol succinate (TOPROL-XL) 100 MG 24 hr tablet TAKE 1 TABLET (100 MG TOTAL) BY MOUTH DAILY. TAKE WITH OR IMMEDIATELY FOLLOWING A MEAL. 30 tablet 11  . Multiple Vitamins-Minerals (CENTRUM ADULTS PO) Take 1 tablet by mouth daily.    . niacin 500 MG tablet Take 500 mg by mouth at bedtime.    . pravastatin (PRAVACHOL) 20 MG tablet TAKE 1 TABLET BY MOUTH EVERY DAY 30 tablet 6  . tadalafil (CIALIS) 5 MG tablet Take 5 mg by mouth daily as needed for erectile dysfunction.    . Testosterone (FORTESTA TD) Place 2 Act onto the skin daily. 1.62%    . TURMERIC PO Take by mouth.    . vitamin E 400 UNIT capsule Take 400 Units by mouth daily.    Marland Kitchen zonisamide (ZONEGRAN) 25 MG capsule Take 1 capsule (25 mg total) by mouth daily. 30 capsule 5   No facility-administered medications prior to visit.     PAST MEDICAL  HISTORY: Past Medical History:  Diagnosis Date  . Arthritis   . Bicuspid aortic valve    with mild to moderate AR by echo 10/2016  . BPH (benign prostatic hyperplasia)   . CAD (coronary artery disease)    s/p CABG [place]  . Depression   . Dilated aortic root (HCC)    24mm by echo 03/2016  . History of blood transfusion 1998   "due to cardiac bypass surgery"  . Hyperlipidemia   . Hypertension   . Hypogonadism in male   . Migraine   . Retinal hemorrhage 11/04/2014    PAST SURGICAL HISTORY: Past Surgical History:  Procedure Laterality Date  . CORONARY ARTERY BYPASS GRAFT  1998  . TEE WITHOUT CARDIOVERSION N/A 01/19/2015   Procedure: TRANSESOPHAGEAL ECHOCARDIOGRAM  (TEE);  Surgeon: Sueanne Margarita, MD;  Location[facility] ENDOSCOPY;  Service: Cardiovascular;  Laterality: N/A;  . VARICOSE VEIN SURGERY  1984    FAMILY HISTORY: Family History  Problem Relation Age of Onset  . Arthritis Mother        deceased  . Hyperlipidemia Mother   . Arthritis Father   . Hyperlipidemia Father   . Heart disease Father 68  . Stroke Father 58       deceased  . Hypertension Father   . Kidney disease Paternal Grandfather     SOCIAL HISTORY: Social History   Social History  . Marital status: Divorced    Spouse name: N/A  . Number of children: N/A  . Years of education: N/A   Occupational History  . Not on file.   Social History Main Topics  . Smoking status: Former Smoker    Years: 20.00    Quit date: 07/22/1986  . Smokeless tobacco: Never Used  . Alcohol use Yes     Comment: 3-4 weekly  . Drug use: No  . Sexual activity: Not on file   Other Topics Concern  . Not on file   Social History Narrative   Divorced   Secondary school teacher- retired   No children   Has a Neurosurgeon named [name]   Enjoys outdoor activities- hiking, swimming, archeology, baseball, writing, reading, cooking      PHYSICAL EXAM  Vitals:   04/14/17 0852  BP: 128/68  Pulse: 72  Weight: 162 lb 12.8 oz (73.8 kg)   Body mass index is 24.75 kg/m.  Generalized: Well developed, in no acute distress   Neurological examination  Mentation: Alert oriented to time, place, history taking. Follows all commands speech and language fluent Cranial nerve II-XII: Pupils were equal round reactive to light. Extraocular movements were full, visual field were full on confrontational test. Facial sensation and strength were normal. Uvula tongue midline. Head turning and shoulder shrug  were normal and symmetric. Motor: The motor testing reveals 5 over 5 strength of all 4 extremities. Good symmetric motor tone is noted throughout.  Sensory: Sensory testing is intact to soft touch on all 4 extremities. No  evidence of extinction is noted.  Coordination: Cerebellar testing reveals good finger-nose-finger and heel-to-shin bilaterally.  Gait and station: Gait is normal. Tandem gait is normal. Romberg is negative. No drift is seen.  Reflexes: Deep tendon reflexes are symmetric and normal bilaterally.   DIAGNOSTIC DATA (LABS, IMAGING, TESTING) - I reviewed patient records, labs, notes, testing and imaging myself where available.  Lab Results  Component Value Date   WBC 9.9 01/17/2015   HGB 13.4 01/17/2015   HCT 39.8 01/17/2015   MCV 101.0 (H)  01/17/2015   PLT 326.0 01/17/2015      Component Value Date/Time   NA 134 (L) 01/09/2015 1529   K 3.8 01/09/2015 1529   CL 102 01/09/2015 1529   CO2 27 01/09/2015 1529   GLUCOSE 114 (H) 01/09/2015 1529   BUN 16 01/09/2015 1529   CREATININE 1.36 01/09/2015 1529   CALCIUM 9.4 01/09/2015 1529   PROT 6.7 10/05/2014 0931   ALBUMIN 4.3 10/05/2014 0931   AST 23 10/05/2014 0931   ALT 15 10/05/2014 0931   ALKPHOS 92 10/05/2014 0931   BILITOT 0.4 10/05/2014 0931   Lab Results  Component Value Date   CHOL 133 10/05/2014   HDL 43.30 10/05/2014   LDLCALC 63 10/05/2014   TRIG 135.0 10/05/2014   CHOLHDL 3 10/05/2014   Lab Results  Component Value Date   HGBA1C 5.9 10/05/2014      ASSESSMENT AND PLAN 71 y.o. year old male  has a past medical history of Arthritis; Bicuspid aortic valve; BPH (benign prostatic hyperplasia); CAD (coronary artery disease); Depression; Dilated aortic root ([place]); History of blood transfusion (1998); Hyperlipidemia; Hypertension; Hypogonadism in male; Migraine; and Retinal hemorrhage (11/04/2014). here with:  1. Migraine headaches  2. obstructive sleep apnea on CPAP 3. Insomnia  Patient's CPAP download shows excellent compliance and good treatment of his apnea. He does have a slightly increased residual AHI for that reason we'll increase his pressure 10 cm of water. The patient's headaches have also improved. Although he  continues to have 5 headaches a month. I will increase Zonegran to 50 mg at bedtime. He will continue using Xanax before bedtime and Fioricet to treat his migraines. He is advised that if his symptoms worsen or he develops new symptoms he should let us know. He will follow-up in 6 months or sooner if needed.   [name], MSN, NP-C 04/14/2017, 9:13 AM [facility] [address], [place], [place] [address] [phone]

## 2017-04-14 NOTE — Progress Notes (Signed)
I agree with the assessment and plan as directed by NP .The patient is known to me .   Taite Schoeppner, MD  

## 2017-04-14 NOTE — Patient Instructions (Addendum)
Your Plan:  Continue using CAP nightly  We will increase CPAP pressure 10  Increase Zonegran to 50 mg daily  If your symptoms worsen or you develop new symptoms please let us know.  Thank you for coming to see [place] at [facility]. I hope we have been able to provide you high quality care today.  You may receive a patient satisfaction survey over the next few weeks. We would appreciate your feedback and comments so that we may continue to improve ourselves and the health of our patients.

## 2017-04-25 DIAGNOSIS — F411 Generalized anxiety disorder: Secondary | ICD-10-CM | POA: Diagnosis not present

## 2017-05-01 DIAGNOSIS — F411 Generalized anxiety disorder: Secondary | ICD-10-CM | POA: Diagnosis not present

## 2017-05-27 DIAGNOSIS — F411 Generalized anxiety disorder: Secondary | ICD-10-CM | POA: Diagnosis not present

## 2017-05-28 DIAGNOSIS — I351 Nonrheumatic aortic (valve) insufficiency: Secondary | ICD-10-CM | POA: Diagnosis not present

## 2017-05-28 DIAGNOSIS — I712 Thoracic aortic aneurysm, without rupture: Secondary | ICD-10-CM | POA: Diagnosis not present

## 2017-05-28 DIAGNOSIS — Z87891 Personal history of nicotine dependence: Secondary | ICD-10-CM | POA: Diagnosis not present

## 2017-05-30 ENCOUNTER — Other Ambulatory Visit: Payer: Self-pay | Admitting: Cardiology

## 2017-06-09 ENCOUNTER — Other Ambulatory Visit: Payer: Self-pay | Admitting: Cardiology

## 2017-06-09 DIAGNOSIS — F411 Generalized anxiety disorder: Secondary | ICD-10-CM | POA: Diagnosis not present

## 2017-07-01 ENCOUNTER — Other Ambulatory Visit: Payer: Self-pay | Admitting: Cardiology

## 2017-07-04 ENCOUNTER — Telehealth: Payer: Self-pay | Admitting: Cardiology

## 2017-07-04 NOTE — Telephone Encounter (Signed)
Walk in pt form-sealed envelope dropped off placed in Dr.Turner doc box.

## 2017-07-09 DIAGNOSIS — F411 Generalized anxiety disorder: Secondary | ICD-10-CM | POA: Diagnosis not present

## 2017-07-10 ENCOUNTER — Telehealth: Payer: Self-pay

## 2017-07-10 ENCOUNTER — Telehealth: Payer: Self-pay | Admitting: Cardiology

## 2017-07-10 MED ORDER — METOPROLOL SUCCINATE ER 100 MG PO TB24: ORAL_TABLET | ORAL | 0 refills | Status: DC

## 2017-07-10 MED ORDER — AMLODIPINE BESYLATE 10 MG PO TABS: ORAL_TABLET | ORAL | 0 refills | Status: DC

## 2017-07-10 NOTE — Telephone Encounter (Signed)
Received a letter from patient stating he forgot to schedule his annual appointment in October, he needs a refill on his Metoprolol and Amlodipine. Patient states he has an appt on 09/08/17 with Dr. Radford Pax. Patient is requesting a refill until he can come in to be seen. He also wanted to know if we received a C scan from [facility] on 05/28/17.   I called patient and left a message to call back to discuss refills.

## 2017-07-10 NOTE — Telephone Encounter (Signed)
Patient made aware that refill has been sent to pharmacy until he can make it to his follow up appt on 09/08/17. Patient verbalized understanding and thanked me for the call.

## 2017-07-10 NOTE — Telephone Encounter (Signed)
Follow up    Patient is returning call that he received. It was in reference to his refills. Please call.    Dot phrases not working.

## 2017-07-10 NOTE — Telephone Encounter (Signed)
Informed patient that I would send a 60 day refill in until patient is able to come in for follow up with Dr. Radford Pax and explained that we have not received a C scan from [facility]. Patient verbalized understanding and thanked me for the call.

## 2017-07-17 ENCOUNTER — Encounter: Payer: Self-pay | Admitting: Neurology

## 2017-07-18 ENCOUNTER — Other Ambulatory Visit: Payer: Self-pay | Admitting: Cardiology

## 2017-07-18 ENCOUNTER — Other Ambulatory Visit: Payer: Self-pay | Admitting: Neurology

## 2017-07-18 MED ORDER — BUTALBITAL-ASA-CAFF-CODEINE 50-325-40-30 MG PO CAPS: 1.0000 | ORAL_CAPSULE | ORAL | 2 refills | Status: DC | PRN

## 2017-07-18 MED ORDER — ZONISAMIDE 25 MG PO CAPS: 50.0000 mg | ORAL_CAPSULE | Freq: Every day | ORAL | 3 refills | Status: DC

## 2017-07-18 NOTE — Telephone Encounter (Signed)
  Hello Dr. Brett Fairy--- Thank you so much for your letter on my behalf regarding my VA claim. I am awaiting their response.  I am doing very well with my headaches. I am only having about 4 migraines a month instead of 15-20. Can the Zonisamide be rewritten so I can get a 90 day supply? I am changing my prescription drug insurance and can save money that way.  I continue to use the Fiorinal VERY sparingly. It is a BLESSING when I need it.The last two fills were on June 25 and September 20 so I am still only using 8-10 a month. I will be calling for a new fill tomorrow.  My sleep has improved dramatically since you started treating me. The CPAP is going well, the xanax is working well, and my urologist gives me Tadilifil which reduces my nocturia.  Thank you for everything and I am looking forward to seeing you on March 26!!  [name]     MR [facility] sent an e mail requesting 90 days supply of Zonisamide- will do. I refilled Butalbutal for 30 tabs, which is a 90 day supply for him, too. Larey Seat, MD

## 2017-07-23 ENCOUNTER — Encounter: Payer: Self-pay | Admitting: Neurology

## 2017-07-24 ENCOUNTER — Other Ambulatory Visit: Payer: Self-pay | Admitting: Neurology

## 2017-07-29 ENCOUNTER — Other Ambulatory Visit: Payer: Self-pay | Admitting: Neurology

## 2017-08-17 ENCOUNTER — Encounter: Payer: Self-pay | Admitting: Neurology

## 2017-08-21 ENCOUNTER — Other Ambulatory Visit: Payer: Self-pay | Admitting: Adult Health

## 2017-08-21 DIAGNOSIS — F5104 Psychophysiologic insomnia: Secondary | ICD-10-CM

## 2017-08-21 NOTE — Telephone Encounter (Signed)
Fax confirmation received xanax [employer] [phone].

## 2017-08-27 ENCOUNTER — Telehealth: Payer: Self-pay | Admitting: Neurology

## 2017-08-27 NOTE — Telephone Encounter (Signed)
PA submitted over the phone with [place]. REF # [id number]. Will take up to 72 hours before we hear back.

## 2017-08-28 NOTE — Telephone Encounter (Signed)
[employer] has approved the medication from 08/27/17-08/27/2018.  REF # [id number]

## 2017-09-07 NOTE — Progress Notes (Signed)
Cardiology Office Note:    Date:  09/08/2017   ID:  [name], DOB Oct 15, 1945, MRN [id number]  PCP:  Burman Freestone, MD  Cardiologist:  No primary care provider on file.    Referring MD: Burman Freestone, MD   Chief Complaint  Patient presents with  . Coronary Artery Disease  . Aortic Insuffiency    History of Present Illness:    [name] is a 72 y.o. male with a hx of CAD with CABG in 1998, bicuspid AV with moderate to severe AI and dilated aortic root measuring 4.8cm in diameter by TEE 12/2014  followed by Dr. Clementeen Graham at [facility].  He has mild carotid artery stenosis on dopplers. He has a history of dyslipidemia. He has a history of mild renal insufficiency felt secondary to hypertensive nephrosclerosis  He is here today for followup and is doing well.  He denies any chest pain or pressure, SOB, DOE, PND, orthopnea, LE edema, dizziness, palpitations or syncope. He is compliant with his meds and is tolerating meds with no SE.    Past Medical History:  Diagnosis Date  . Arthritis   . Bicuspid aortic valve    with mild to moderate AR by echo 10/2016  . BPH (benign prostatic hyperplasia)   . CAD (coronary artery disease)    s/p CABG [place]  . Depression   . Dilated aortic root (HCC)    70mm by echo 03/2016  . History of blood transfusion 1998   "due to cardiac bypass surgery"  . Hyperlipidemia   . Hypertension   . Hypogonadism in male   . Migraine   . Retinal hemorrhage 11/04/2014    Past Surgical History:  Procedure Laterality Date  . CORONARY ARTERY BYPASS GRAFT  1998  . TEE WITHOUT CARDIOVERSION N/A 01/19/2015   Procedure: TRANSESOPHAGEAL ECHOCARDIOGRAM (TEE);  Surgeon: Sueanne Margarita, MD;  Location[facility] ENDOSCOPY;  Service: Cardiovascular;  Laterality: N/A;  . VARICOSE VEIN SURGERY  1984    Current Medications: Current Meds  Medication Sig  . ALPRAZolam (XANAX XR) 1 MG 24 hr tablet TAKE ONE TABLET BY MOUTH DAILY  . amLODipine (NORVASC) 10 MG  tablet TAKE ONE TABLET BY MOUTH DAILY  . Ascorbic Acid (VITAMIN C) 1000 MG tablet Take 1,000 mg by mouth 2 (two) times daily.  [name] aspirin EC 81 MG tablet Take 81 mg by mouth daily.  . butalbital-aspirin-caffeine-codeine (FIORINAL WITH CODEINE) 50-325-40-30 MG capsule TAKE 1 CAPSULE BY MOUTH EVERY 4 HOURS AS NEEDED FOR PAIN  . losartan (COZAAR) 25 MG tablet TAKE ONE TABLET BY MOUTH DAILY  . metoprolol succinate (TOPROL-XL) 100 MG 24 hr tablet TAKE 1 TABLET BY MOUTH DAILY  . Multiple Vitamins-Minerals (CENTRUM ADULTS PO) Take 1 tablet by mouth daily.  . niacin 500 MG tablet Take 500 mg by mouth at bedtime.  . pravastatin (PRAVACHOL) 20 MG tablet TAKE 1 TABLET BY MOUTH EVERY DAY  . tadalafil (CIALIS) 5 MG tablet Take 5 mg by mouth daily as needed for erectile dysfunction.  . Testosterone (FORTESTA TD) Place 2 Act onto the skin daily. 1.62%  . TURMERIC PO Take by mouth.  . vitamin E 400 UNIT capsule Take 400 Units by mouth daily.  [name] zonisamide (ZONEGRAN) 25 MG capsule Take 2 capsules (50 mg total) by mouth daily.     Allergies:   Mushroom extract complex and Propoxyphene   Social History   Socioeconomic History  . Marital status: Divorced    Spouse name:  None  . Number of children: None  . Years of education: None  . Highest education level: None  Social Needs  . Financial resource strain: None  . Food insecurity - worry: None  . Food insecurity - inability: None  . Transportation needs - medical: None  . Transportation needs - non-medical: None  Occupational History  . None  Tobacco Use  . Smoking status: Former Smoker    Years: 20.00    Last attempt to quit: 07/22/1986    Years since quitting: 31.1  . Smokeless tobacco: Never Used  Substance and Sexual Activity  . Alcohol use: Yes    Comment: 3-4 weekly  . Drug use: No  . Sexual activity: None  Other Topics Concern  . None  Social History Narrative   Divorced   Secondary school teacher- retired   No children   Has a Neurosurgeon named [name]    Enjoys outdoor activities- hiking, swimming, Control and instrumentation engineer, baseball, writing, reading, cooking     Family History: The patient's family history includes Arthritis in his father and mother; Heart disease (age of onset: 7) in his father; Hyperlipidemia in his father and mother; Hypertension in his father; Kidney disease in his paternal grandfather; Stroke (age of onset: 94) in his father.  ROS:   Please see the history of present illness.    Review of Systems  Musculoskeletal: Positive for back pain.    All other systems reviewed and negative.   EKGs/Labs/Other Studies Reviewed:    The following studies were reviewed today: none  EKG:  EKG is not ordered today.   Recent Labs: No results found for requested labs within last 8760 hours.   Recent Lipid Panel    Component Value Date/Time   CHOL 133 10/05/2014 0931   TRIG 135.0 10/05/2014 0931   HDL 43.30 10/05/2014 0931   CHOLHDL 3 10/05/2014 0931   VLDL 27.0 10/05/2014 0931   LDLCALC 63 10/05/2014 0931    Physical Exam:    VS:  BP (!) 118/58   Pulse 63   Ht 5\' 8"  (1.727 m)   Wt 161 lb 3.2 oz (73.1 kg)   SpO2 99%   BMI 24.51 kg/m     Wt Readings from Last 3 Encounters:  09/08/17 161 lb 3.2 oz (73.1 kg)  04/14/17 162 lb 12.8 oz (73.8 kg)  10/10/16 163 lb (73.9 kg)     GEN:  Well nourished, well developed in no acute distress HEENT: Normal NECK: No JVD; No carotid bruits LYMPHATICS: No lymphadenopathy CARDIAC: RRR, no murmurs, rubs, gallops RESPIRATORY:  Clear to auscultation without rales, wheezing or rhonchi  ABDOMEN: Soft, non-tender, non-distended MUSCULOSKELETAL:  No edema; No deformity  SKIN: Warm and dry NEUROLOGIC:  Alert and oriented x 3 PSYCHIATRIC:  Normal affect   ASSESSMENT:    1. Coronary artery disease involving native coronary artery of native heart without angina pectoris   2. Essential hypertension   3. Ascending aortic aneurysm ([place])   4. Nonrheumatic aortic valve insufficiency   5.  Pure hypercholesterolemia    PLAN:    In order of problems listed above:  1.  ASCAD - s/p remote CABG in [place].  He denies any anginal symptoms.  He will continue on BB, statin and ASA.  2.  HTN - BP is well controlled on exam today.  He will continue on amlodipine 10mg  daily, Losartan 25mg  daily and Toprol XL 100mg  daily.  I will check a BMET.  3.  Ascending aortic aneurysm - 43mm by  echo 10/2016.  CT scan 05/2017 showed stable dimensions at 4.9 x 4.6cm.  Repeat echo for progression. Dr. Clementeen Graham at [facility] is following.  Continue statin and antihypertensive meds.  He knows to avoid any upper body weight lifting.   4.  Bicuspid aortic valve with mild to moderate AR by echo 10/2016.  I will repeat echo 10/2017 to make sure this has not progressed.   5.  Hyperlipidemia with LDL goal < 70.  He will continue on pravastatin 20mg  daily.  I will get an FLP and ALT.     Medication Adjustments/Labs and Tests Ordered: Current medicines are reviewed at length with the patient today.  Concerns regarding medicines are outlined above.  No orders of the defined types were placed in this encounter.  No orders of the defined types were placed in this encounter.   Signed, Fransico Him, MD  09/08/2017 9:20 AM    [place]

## 2017-09-08 ENCOUNTER — Encounter: Payer: Self-pay | Admitting: Cardiology

## 2017-09-08 ENCOUNTER — Ambulatory Visit (INDEPENDENT_AMBULATORY_CARE_PROVIDER_SITE_OTHER): Payer: Medicare Other | Admitting: Cardiology

## 2017-09-08 VITALS — BP 118/58 | HR 63 | Ht 68.0 in | Wt 161.2 lb

## 2017-09-08 DIAGNOSIS — I2510 Atherosclerotic heart disease of native coronary artery without angina pectoris: Secondary | ICD-10-CM | POA: Diagnosis not present

## 2017-09-08 DIAGNOSIS — I351 Nonrheumatic aortic (valve) insufficiency: Secondary | ICD-10-CM

## 2017-09-08 DIAGNOSIS — I10 Essential (primary) hypertension: Secondary | ICD-10-CM | POA: Diagnosis not present

## 2017-09-08 DIAGNOSIS — E7800 Pure hypercholesterolemia, unspecified: Secondary | ICD-10-CM

## 2017-09-08 DIAGNOSIS — I712 Thoracic aortic aneurysm, without rupture: Secondary | ICD-10-CM

## 2017-09-08 DIAGNOSIS — I7121 Aneurysm of the ascending aorta, without rupture: Secondary | ICD-10-CM

## 2017-09-08 NOTE — Patient Instructions (Signed)
Medication Instructions:  Your physician recommends that you continue on your current medications as directed. Please refer to the Current Medication list given to you today.  Labwork: Your physician recommends that you return for lab work in 1 week for kidney function test and fasting lipids.    Testing/Procedures: Your physician has requested that you have an echocardiogram in April 2019. Echocardiography is a painless test that uses sound waves to create images of your heart. It provides your doctor with information about the size and shape of your heart and how well your heart's chambers and valves are working. This procedure takes approximately one hour. There are no restrictions for this procedure.   Follow-Up: Your physician wants you to follow-up in: 1 year with Dr. Radford Pax. You will receive a reminder letter in the mail two months in advance. If you don't receive a letter, please call our office to schedule the follow-up appointment.  Any Other Special Instructions Will Be Listed Below (If Applicable).    Thank you for choosing [facility], RN  [phone]  If you need a refill on your cardiac medications before your next appointment, please call your pharmacy.

## 2017-09-22 ENCOUNTER — Encounter: Payer: Self-pay | Admitting: Cardiology

## 2017-09-22 DIAGNOSIS — Z79899 Other long term (current) drug therapy: Secondary | ICD-10-CM

## 2017-09-24 ENCOUNTER — Other Ambulatory Visit: Payer: Self-pay | Admitting: Cardiology

## 2017-09-25 NOTE — Telephone Encounter (Signed)
Left a message to call back.

## 2017-09-26 MED ORDER — LISINOPRIL 10 MG PO TABS: 10.0000 mg | ORAL_TABLET | Freq: Every day | ORAL | 3 refills | Status: DC

## 2017-09-26 NOTE — Telephone Encounter (Signed)
F/U Call:  Patient calling, states that he is returning call 

## 2017-09-26 NOTE — Telephone Encounter (Signed)
Please start Lisinopril 10mg  daily in place of losartan and repeat BMET in 1 week. Please have him check his BP daily for a week and call with results.     Traci   Instructed patient to STOP losartan and start lisinopril 10 mg once a day, patient instructed to call back to schedule BMET 1 week after starting new medication. And instructed patient to check blood pressure for 1 week and call office with BP readings. Patient verbalized understanding and thankful for the call.

## 2017-09-29 ENCOUNTER — Encounter: Payer: Self-pay | Admitting: Cardiology

## 2017-10-14 ENCOUNTER — Ambulatory Visit (INDEPENDENT_AMBULATORY_CARE_PROVIDER_SITE_OTHER): Payer: Medicare Other | Admitting: Neurology

## 2017-10-14 ENCOUNTER — Telehealth: Payer: Self-pay | Admitting: Cardiology

## 2017-10-14 ENCOUNTER — Encounter: Payer: Self-pay | Admitting: Neurology

## 2017-10-14 VITALS — BP 133/75 | HR 55 | Ht 68.0 in | Wt 160.0 lb

## 2017-10-14 DIAGNOSIS — I2510 Atherosclerotic heart disease of native coronary artery without angina pectoris: Secondary | ICD-10-CM | POA: Diagnosis not present

## 2017-10-14 DIAGNOSIS — Z9989 Dependence on other enabling machines and devices: Secondary | ICD-10-CM

## 2017-10-14 DIAGNOSIS — G43109 Migraine with aura, not intractable, without status migrainosus: Secondary | ICD-10-CM

## 2017-10-14 DIAGNOSIS — G4733 Obstructive sleep apnea (adult) (pediatric): Secondary | ICD-10-CM

## 2017-10-14 DIAGNOSIS — F5104 Psychophysiologic insomnia: Secondary | ICD-10-CM

## 2017-10-14 MED ORDER — BUTALBITAL-ASA-CAFF-CODEINE 50-325-40-30 MG PO CAPS: ORAL_CAPSULE | ORAL | 0 refills | Status: DC

## 2017-10-14 MED ORDER — ALPRAZOLAM ER 1 MG PO TB24: 1.0000 mg | ORAL_TABLET | Freq: Every day | ORAL | 5 refills | Status: DC

## 2017-10-14 MED ORDER — LOSARTAN POTASSIUM 25 MG PO TABS: 25.0000 mg | ORAL_TABLET | Freq: Every day | ORAL | 3 refills | Status: DC

## 2017-10-14 NOTE — Telephone Encounter (Signed)
New Message  Pt c/o medication issue:  1. Name of Medication: lisinopril (PRINIVIL,ZESTRIL) 10 MG tablet  2. How are you currently taking this medication (dosage and times per day)? Take 1 tablet (10 mg total) by mouth daily  3. Are you having a reaction (difficulty breathing--STAT)? abdominal pain that shoots through to his back  4. What is your medication issue? Pt states that when he was on the losartan that he didn't have side effects  Also wants to know if he still needs labs since he has already had some done this year unless its for his kidneys. Please call

## 2017-10-14 NOTE — Telephone Encounter (Signed)
Agree with recommendations.  

## 2017-10-14 NOTE — Patient Instructions (Signed)

## 2017-10-14 NOTE — Telephone Encounter (Signed)
I returned call to patient. He states that since starting lisinopril he has been having intermittent sharp shooting abdominal pain. He also stated that he confirmed with his pharmacy that his supply of losartan was not affected by the recall and he was sent a 90 day supply of losartan 25 mg and he would like to switch back to losartan. I advised patient to stop taking lisinopril and restart losartan. Patient is scheduled for BMET on 10/17/17 since he did take lisinopril for a week. Patient in agreement with plan and thankful for the call. Informed patient I would forward to Dr. Radford Pax for review.

## 2017-10-14 NOTE — Progress Notes (Signed)
SLEEP MEDICINE CLINIC   Provider:  Larey Seat, M D  Referring Provider: Burman Freestone, MD Primary Care Physician:  Burman Freestone, MD  Chief Complaint  Patient presents with  . Sleep Apnea    Patient doing well.     HPI:  [name] is a 72 y.o. male i seen on 10-14-2017,n a Rv. Last visit was in Sept 2018 with [name] , NP .   [name] is a meanwhile 72 year old Caucasian gentleman whom I have followed now for about 2 years.  He presented with difficulties initiating and maintaining sleep was tested for obstructive sleep apnea and the results were positive.  He was quite apprehensive initially starting on CPAP but has meanwhile mastered the use very well and has benefited greatly.  He has become a very compliant patient 97% for the last 30 days, 8 hours of average nighttime use, with a CPAP set at 10 cmH2O with 3 cm EPR his residual AHI was 4.0 of which 2.1 apneas are obstructive in nature.  He does have some central apnea arising under treatment about 3% of the night.  For this reason I would not like to increase his pressure further. In cooperation with his counselor I have also provided alprazolam 1 mg extended release which has helped with his chronic insomnia.  He has never abused the medication, and is not asking for early refills.  Occasionally he will suffer a headache that is best responding to butalbital, here again the last prescription is from July 28, 2017 and at this time he still has 10 of his 30 pills left. I migraine a week from 4 a week before treatment.  The patient is also a [facility] patient - and I wrote a letter to the [place] [employer]ion, and today we go through a questionnaire he was send.      Originally seen here as a referral from Dr. Kerney [name] -Drew Glassman, MD at [facility] for a sleep evaluation, [name] has a history of hypercholesterolemia of heart disease, migraines, anxiety and hypertension he had undergone a cardiac bypass surgery in 1998  at a rather young age, and most recently had surgery for the replacement of cloudy lenses, both eyes had cataract surgery in 2013 at 2014. He describes insomnia and headaches as well as anxiety and hearing loss. He is to establish himself as a new neurologist and to see if his headache history is correlated with his sleep disturbance.  He has an aortic aneurysm, arthritis, Aortic valve regurgitation.  The patient reports that headaches prevent him from going to sleep but they do not wake him up and then not always present when he wakes up. He was originally diagnosed in college with migrainous headaches 50 years ago, these headaches are intense, sometimes associated with a visual aura, almost always nausea to the level of emesis was present. Migraines may have lasted for longer than 24 hours up to 3 days in its worst kind. He had photophobia, phonophobia but no olfactory triggers.He was treated with powerful narcotics, such as Talwin, Mepergan forte, Percodan, Percocet and nasal sprays . He would have as many as 15 migraines with hospital admission per semester, affecting his social and professional life. After his bypass surgery in 1998 he experienced a significant reduction in migraine frequency but not intensity.  A beta blocker had been prescribed and seemed to reduce his headache frequency. He was switched to Fiorinal, and has been happy with the fact to this day. For  25 years he has been able to control his migraines and stay out of the emergency room, needing no infusions or injections.He has kept a daily record of his headaches he takes Excedrin for tension headache and fewer note this codeine for migraines only. He had a migraine on August 1, the 11th, August 27 and 28th, and apparently found relief with Fiorinal. He has never overused the  medication, never requested early refills. He takes on average 5 -7 fiorinal per month.   Sleep habits are as follows:  He used to have nocturia times 5, ,but  after implementing Cialis 5 mg daily use he only experienced this 1-2 nightly.  Bedtime is late-, between 11 PM and midnight . He falls asleep within 10-15 minutes. He uses Trazodone, melatonin without much success.  He frequently wakes up after 4 hours of sleep sometimes 5, 50% of the nights he cannot continue and return to sleep and will and right there. He has to rise in the morning at about 6:30 AM. This allows for only 6-1/2 hours of sleep. It has been a long-standing habit of his to go to bed at bedtime and rise at that time. Some nights he will only get 4 hours of sleep.His bedroom is described as cool, quiet and dark. He sleeps in supine position, and uses just one pillow for head support. Waking up in the morning is only on occasion associated with a headache, normally no dry mouth nor diaphoresis palpitations. He does not report any nightmares. He is usually not woken by pain of any kind. He does not suffer from gastroesophageal acid reflux.   Interval history from 06/05/2016 [name] returns after his recent sleep study which took place on 05/26/2016 listed as referral PCP is  Debbrah Alar, N practitioner .  The patient's sleep study revealed an AHI of 39.8 there was no REM sleep noted he had complex apnea, consisting of 44 obstructive apneas and 25 centrals and 14 mixed apneas in supine his apnea was not stronger than in nonsupine position he did not have significant oxygen desaturations or hypercapnia. I have recommended to return for a full night CPAP titration which we discussed today. [name]. is concerned that CPAP will be a burden on him and his social life. I assured him that we should try CPAP first and he may have pleasant surprise actually enjoy sleeping with a device. If he is unable to tolerate CPAP he can surely try a dental device but it is not likely as successful as the CPAP in alleviating apnea.  Interval history from 10/10/2016, [name] is doing exceptionally well, he  is highly compliant with CPAP use at 7 cm water pressure with 3 cm EPR, his residual AHI is 10.5 little bit too high and I would like to increase his pressure just by a notch. We will go to 9 cm water pressure with 3 cm EPR after today's visit. He has used the machine 8 hours and 18 minutes at night the residual apneas are obstructive in nature. He had undergone a CPAP titration on 06/25/2016 but his residual AHI was 0.2 per hour at 7 cm water pressure. He slept mostly in supine position during that test did not show any hypoxemia or hypercapnia and was fitted with a full face mask in medium size a model called Simplus. He also has seen otolaryngology in the meantime an audiogram was performed on 09/20/2016 and it appears that these are service related injuries to his hearing. These  also have promoted tinnitus. Hearing loss with bilateral. He has used sparingly Fiorinal and actually brought me the bottle he had not had a refill since January 1 there are still 5 pills left of the 30 prescribed . He uses about 8 per month. He no longer reports excessive daytime sleepiness and finds restful and prolonged sound sleep with Xanax extended release. This had been recommended by his counselor, but she does not have prescription privileges. I have taken this prescription over.  History of Present Illness:  ANTOINE VANDERMEULEN is a 72 y.o. male with a history of CAD with CABG in 1998 after presenting with USAP.  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  He is followed by Dr. Clementeen Graham at [facility].   He has mild carotid artery stenosis on dopplers.  He has a history of dyslipidemia.  He has a history of mild renal insufficiency felt secondary to hypertensive nephrosclerosis.  He denies any chest pain or pressure, SOB, DOE, dizziness, palpitations, claudication or syncope.  He occasionally has some pedal edema if he eats too much salt in his diet.      Review of Systems: Out of a complete 14 system review, the patient complains of only the following symptoms, and all other reviewed systems are negative. Sleeps more than 6 hours nightly on Xanax, no more headaches in AM, rare need for Fiorinal ( 8 a month ) ,   Migraine with visual aura, nausea and photophobia. He feels movement aggravated his migraines, now he has 1 a week from 4 a week.   Epworth score 1 , Fatigue severity score 20  , geriatric  depression score 1/15    Social History   Socioeconomic History  . Marital status: Divorced    Spouse name: Not on file  . Number of children: Not on file  . Years of education: Not on file  . Highest education level: Not on file  Occupational History  . Not on file  Social Needs  . Financial resource strain: Not on file  . Food insecurity:    Worry: Not on file    Inability: Not on file  . Transportation needs:    Medical: Not on file    Non-medical: Not on file  Tobacco Use  . Smoking status: Former Smoker    Years: 20.00    Last attempt to quit: 07/22/1986    Years since quitting: 31.2  . Smokeless tobacco: Never Used  Substance and Sexual Activity  . Alcohol use: Yes    Comment: 3-4 weekly  . Drug use: No  . Sexual activity: Not on file  Lifestyle  . Physical activity:    Days per week: Not on file    Minutes per session: Not on file  . Stress: Not on file  Relationships  . Social connections:    Talks on phone: Not on file    Gets together: Not on file    Attends religious service: Not on file    Active member of club or organization: Not on file    Attends meetings of clubs or organizations: Not on file    Relationship status: Not on file  . Intimate partner violence:    Fear of current or ex partner: Not on file    Emotionally abused: Not on file    Physically abused: Not on file    Forced sexual activity: Not on file  Other Topics Concern  . Not on file  Social History Narrative   Divorced   Engineer, maintenance- retired   No children   Has a Neurosurgeon named [name]   Enjoys outdoor activities- hiking, swimming, archeology, baseball, writing, reading, cooking    Family History  Problem Relation Age of Onset  . Arthritis Mother        deceased  . Hyperlipidemia Mother   . Arthritis Father   . Hyperlipidemia Father   . Heart disease Father 73  . Stroke Father 66       deceased  . Hypertension Father   . Kidney disease Paternal Grandfather     Past Medical History:  Diagnosis Date  . Arthritis   . Bicuspid aortic valve    with mild to moderate AR by echo 10/2016  . BPH (benign prostatic hyperplasia)   . CAD (coronary artery disease)    s/p CABG [place] [place]  . Depression   . Dilated aortic root (HCC)    90mm by echo 03/2016  . History of blood transfusion 1998   "due to cardiac bypass surgery"  . Hyperlipidemia   . Hypertension   . Hypogonadism in male   . Migraine   . Retinal hemorrhage 11/04/2014    Past Surgical History:  Procedure Laterality Date  . CORONARY ARTERY BYPASS GRAFT  1998  . TEE WITHOUT CARDIOVERSION N/A 01/19/2015   Procedure: TRANSESOPHAGEAL ECHOCARDIOGRAM (TEE);  Surgeon: Sueanne Margarita, MD;  Location: [facility] ENDOSCOPY;  Service: Cardiovascular;  Laterality: N/A;  . [place]    Current Outpatient Medications  Medication Sig Dispense Refill  . ALPRAZolam (XANAX XR) 1 MG 24 hr tablet TAKE ONE TABLET BY MOUTH DAILY 30 tablet 2  . amLODipine (NORVASC) 10 MG tablet TAKE 1 TABLET BY MOUTH DAILY **MUST CALL OFFICE AND SCHEDULE APPOINTMENT FOR FUTURE REFIILS. 2ND ATTEMPT** 90 tablet 3  . Ascorbic Acid (VITAMIN C) 1000 MG tablet Take 1,000 mg by mouth 2 (two) times daily.    Marland Kitchen aspirin EC 81 MG tablet Take 81 mg by mouth daily.    . butalbital-aspirin-caffeine-codeine (FIORINAL WITH CODEINE) 50-325-40-30 MG capsule TAKE 1 CAPSULE BY MOUTH EVERY 4 HOURS AS NEEDED FOR PAIN 30 capsule 0  . lisinopril (PRINIVIL,ZESTRIL) 10 MG tablet Take 1 tablet (10  mg total) by mouth daily. 90 tablet 3  . metoprolol succinate (TOPROL-XL) 100 MG 24 hr tablet TAKE 1 TABLET BY MOUTH DAILY **CALL AND SCHEDULE APPOINTMENT FOR FUTURE REFILLS. 2ND ATTEMPT** 90 tablet 3  . Multiple Vitamins-Minerals (CENTRUM ADULTS PO) Take 1 tablet by mouth daily.    . niacin 500 MG tablet Take 500 mg by mouth at bedtime.    . pravastatin (PRAVACHOL) 20 MG tablet TAKE 1 TABLET BY MOUTH EVERY DAY 30 tablet 6  . tadalafil (CIALIS) 5 MG tablet Take 5 mg by mouth daily as needed for erectile dysfunction.    . Testosterone (FORTESTA TD) Place 2 Act onto the skin daily. 1.62%    . TURMERIC PO Take by mouth.    . vitamin E 400 UNIT capsule Take 400 Units by mouth daily.    Marland Kitchen zonisamide (ZONEGRAN) 25 MG capsule Take 2 capsules (50 mg total) by mouth daily. 180 capsule 3   No current facility-administered medications for this visit.     Allergies as of 10/14/2017 - Review Complete 10/14/2017  Allergen Reaction Noted  . Mushroom extract complex  09/21/2014  . Propoxyphene Nausea And Vomiting 01/20/2015  Vitals: BP 133/75   Pulse (!) 55   Ht 5\' 8"  (1.727 m)   Wt 160 lb (72.6 kg)   BMI 24.33 kg/m  Last Weight:  Wt Readings from Last 1 Encounters:  10/14/17 160 lb (72.6 kg)   OEV:OJJK mass index is 24.33 kg/m.     Last Height:   Ht Readings from Last 1 Encounters:  10/14/17 5\' 8"  (1.727 m)    Physical exam:  General: The patient is awake, alert and appears not in acute distress. The patient is well groomed. Head: Normocephalic, atraumatic. Neck is supple. Mallampati 2 ,  Dentures partial neck circumference:15. Nasal airflow patent , TMJ not evident. Retrognathia is seen.  Cardiovascular:  Regular rate and rhythm , without murmurs or carotid bruit, and without distended neck veins. Respiratory: Lungs are clear to auscultation.Skin:  Without evidence of edema, or rashTrunk: BMI is normal. The patient's posture is erect   Neurologic exam : The patient is awake and alert,  oriented to place and time.   Attention span & concentration ability appears impaired. Speech is fluent, without dysarthria, dysphonia or aphasia.  Mood and affect are appropriate.  Cranial nerves:  No change in taste or smell. Pupils are equal and briskly reactive to light. Funduscopic exam without evidence of pallor or edema.  Extraocular movements  in vertical and horizontal planes intact and without nystagmus. Visual fields by finger perimetry are intact. Hearing impaired.   Facial sensation intact to fine touch. Facial motor strength is symmetric and tongue and uvula move midline.  Shoulder shrug was symmetrical.   Normal muscle bulk and tone, no tremor, ataxia or dysmetria, and preserved , symmetric DTR.    The patient was advised of the nature of the diagnosed sleep disorder , the treatment options and risks for general a health and wellness arising from not treating the condition.  I spent more than 20 minutes of face to face time with the patient. Greater than 50% of time was spent in counseling and coordination of care. We have discussed the diagnosis and differential and I answered the patient's questions.     Assessment:  After physical and neurologic examination, review of laboratory studies,  Personal review of imaging studies, reports of other /same  Imaging studies ,  Results of polysomnography/ neurophysiology testing and pre-existing records as far as provided in visit., my assessment is   1)  insomnia - much improved on CPAP and xanax xr .  There is a chronic component to his insomnia but I would like for him to have an attended sleep study to see if he suffers from any organic contributory reasons.    2) He has been diagnosed with complex sleep apnea and mild - moderate PLMs, No Co2 retention, no hypoxemia that would explain the headaches. He uses CPAP now, compliantly - I leave his pressure at  10 cm , increased last 6 month due to residual OSA above AHI of 5.   3) he  had a mixed headache type, most frequently suffering from tension headaches (which respond to non steroidals), but more infrequently this days from migraine headaches which require Excedrin or Fiorinal. He assured me that over the last almost 25 years that he has been taking the medication.We were able to reduce his headache frequency for migraines to 1 or 2 a month- on zonisamide.,  I have no problems to maintain him on Fiorinal. He brought his Fiorinal prescription to today's appointment and he has taken 8 pills since July 23 2016.   4)The patient has an extensive cardiovascular health history, Not a TRIPTAN candidate as he is a patient with aortic valve disease, coronary artery disease status post bypass.    Plan:  Treatment plan and additional workup : Rv in 6 month for refills, compliance. Marland Kitchen   Refills for Xanax XR and Jarome Matin Shianna Bally MD  10/14/2017   CC: Burman Freestone, [address] [place], [place] [address]

## 2017-10-17 ENCOUNTER — Other Ambulatory Visit: Payer: Medicare Other | Admitting: *Deleted

## 2017-10-17 ENCOUNTER — Telehealth: Payer: Self-pay | Admitting: Cardiology

## 2017-10-17 DIAGNOSIS — E7800 Pure hypercholesterolemia, unspecified: Secondary | ICD-10-CM

## 2017-10-17 DIAGNOSIS — I2510 Atherosclerotic heart disease of native coronary artery without angina pectoris: Secondary | ICD-10-CM

## 2017-10-17 LAB — LIPID PANEL
CHOLESTEROL TOTAL: 151 mg/dL (ref 100–199)
Chol/HDL Ratio: 3.4 ratio (ref 0.0–5.0)
HDL: 44 mg/dL (ref >39)
LDL Calculated: 79 mg/dL (ref 0–99)
Triglycerides: 140 mg/dL (ref 0–149)
VLDL CHOLESTEROL CAL: 28 mg/dL (ref 5–40)

## 2017-10-17 LAB — BASIC METABOLIC PANEL
BUN/Creatinine Ratio: 19 (ref 10–24)
BUN: 26 mg/dL (ref 8–27)
CHLORIDE: 104 mmol/L (ref 96–106)
CO2: 23 mmol/L (ref 20–29)
Calcium: 8.9 mg/dL (ref 8.6–10.2)
Creatinine, Ser: 1.4 mg/dL — ABNORMAL HIGH (ref 0.76–1.27)
GFR calc Af Amer: 59 mL/min/{1.73_m2} — ABNORMAL LOW (ref >59)
GFR calc non Af Amer: 51 mL/min/{1.73_m2} — ABNORMAL LOW (ref >59)
GLUCOSE: 113 mg/dL — AB (ref 65–99)
Potassium: 5 mmol/L (ref 3.5–5.2)
Sodium: 141 mmol/L (ref 134–144)

## 2017-10-17 NOTE — Telephone Encounter (Signed)
New message ° ° ° °Patient calling for lab results °

## 2017-10-17 NOTE — Telephone Encounter (Signed)
I spoke with patient and informed patient that results are not back and once results are reviewed by Dr. Radford Pax they will be visible via MyChart and I would give him a call back. Patient verbalized understanding and thankful for the call

## 2017-10-20 ENCOUNTER — Telehealth: Payer: Self-pay

## 2017-10-20 DIAGNOSIS — E785 Hyperlipidemia, unspecified: Secondary | ICD-10-CM

## 2017-10-20 MED ORDER — PRAVASTATIN SODIUM 40 MG PO TABS: 40.0000 mg | ORAL_TABLET | Freq: Every evening | ORAL | 3 refills | Status: DC

## 2017-10-20 NOTE — Telephone Encounter (Signed)
Notes recorded by Teressa Senter, RN on 10/20/2017 at 11:20 AM EDT Patient made aware of lab results and Dr. Theodosia Blender recommendation to INCREASE pravastatin 40 mg daily and repeat labs in 6 weeks. Patient also stated he is not taking NSAIDS and takes a daily multivitamin that contains a little potassium. Patient is scheduled for repeat labs on 12/08/17. Patient in agreement with treatment plan and thankful for the call   Notes recorded by Sueanne Margarita, MD on 10/17/2017 at 5:36 PM EDT LDL not at goal - increase pravastatin to 40mg  dailiyy and repeat FLP and ALT in 6 weeks - please find out if he is using any NSAIDS or suppl with potassium in them. He is also on Losartan and needs to check with pharmacy if he has a lot that was affected by the contaminiation and let us know

## 2017-10-22 ENCOUNTER — Encounter: Payer: Self-pay | Admitting: Neurology

## 2017-10-23 ENCOUNTER — Encounter: Payer: Self-pay | Admitting: Cardiology

## 2017-10-23 DIAGNOSIS — Z79899 Other long term (current) drug therapy: Secondary | ICD-10-CM

## 2017-10-24 DIAGNOSIS — Z0289 Encounter for other administrative examinations: Secondary | ICD-10-CM

## 2017-10-27 ENCOUNTER — Telehealth: Payer: Self-pay | Admitting: *Deleted

## 2017-10-27 NOTE — Telephone Encounter (Signed)
[facility] form mailed on 10/27/17 to pt home address

## 2017-10-27 NOTE — Telephone Encounter (Signed)
I spoke with patient, Informed patient that per Dr. Radford Pax he should repeat BMET due to mildly elevated potassium levels. Patient verbalized understanding. Patient scheduled for labs on 10/31/17. Patient thankful for the call.

## 2017-10-31 ENCOUNTER — Other Ambulatory Visit: Payer: Medicare Other

## 2017-10-31 DIAGNOSIS — Z79899 Other long term (current) drug therapy: Secondary | ICD-10-CM

## 2017-10-31 LAB — BASIC METABOLIC PANEL
BUN/Creatinine Ratio: 17 (ref 10–24)
BUN: 22 mg/dL (ref 8–27)
CHLORIDE: 104 mmol/L (ref 96–106)
CO2: 23 mmol/L (ref 20–29)
CREATININE: 1.31 mg/dL — AB (ref 0.76–1.27)
Calcium: 8.9 mg/dL (ref 8.6–10.2)
GFR calc Af Amer: 64 mL/min/{1.73_m2} (ref >59)
GFR calc non Af Amer: 55 mL/min/{1.73_m2} — ABNORMAL LOW (ref >59)
GLUCOSE: 119 mg/dL — AB (ref 65–99)
Potassium: 4.5 mmol/L (ref 3.5–5.2)
Sodium: 140 mmol/L (ref 134–144)

## 2017-11-06 ENCOUNTER — Other Ambulatory Visit: Payer: Self-pay

## 2017-11-06 ENCOUNTER — Ambulatory Visit (HOSPITAL_COMMUNITY): Payer: Medicare Other | Attending: Cardiology

## 2017-11-06 DIAGNOSIS — E785 Hyperlipidemia, unspecified: Secondary | ICD-10-CM | POA: Insufficient documentation

## 2017-11-06 DIAGNOSIS — I10 Essential (primary) hypertension: Secondary | ICD-10-CM | POA: Diagnosis not present

## 2017-11-06 DIAGNOSIS — I351 Nonrheumatic aortic (valve) insufficiency: Secondary | ICD-10-CM | POA: Insufficient documentation

## 2017-11-06 DIAGNOSIS — G4733 Obstructive sleep apnea (adult) (pediatric): Secondary | ICD-10-CM | POA: Insufficient documentation

## 2017-11-06 DIAGNOSIS — I712 Thoracic aortic aneurysm, without rupture: Secondary | ICD-10-CM | POA: Diagnosis not present

## 2017-11-06 DIAGNOSIS — I2510 Atherosclerotic heart disease of native coronary artery without angina pectoris: Secondary | ICD-10-CM | POA: Diagnosis not present

## 2017-11-08 ENCOUNTER — Encounter: Payer: Self-pay | Admitting: Cardiology

## 2017-12-08 ENCOUNTER — Other Ambulatory Visit: Payer: Medicare Other | Admitting: *Deleted

## 2017-12-08 DIAGNOSIS — E785 Hyperlipidemia, unspecified: Secondary | ICD-10-CM

## 2017-12-08 LAB — ALT: ALT: 18 IU/L (ref 0–44)

## 2017-12-08 LAB — LIPID PANEL
CHOLESTEROL TOTAL: 116 mg/dL (ref 100–199)
Chol/HDL Ratio: 3.1 ratio (ref 0.0–5.0)
HDL: 37 mg/dL — ABNORMAL LOW (ref >39)
LDL Calculated: 55 mg/dL (ref 0–99)
TRIGLYCERIDES: 122 mg/dL (ref 0–149)
VLDL Cholesterol Cal: 24 mg/dL (ref 5–40)

## 2018-03-18 ENCOUNTER — Encounter: Payer: Self-pay | Admitting: Neurology

## 2018-03-19 ENCOUNTER — Other Ambulatory Visit: Payer: Self-pay | Admitting: Neurology

## 2018-04-15 ENCOUNTER — Encounter: Payer: Self-pay | Admitting: Adult Health

## 2018-04-20 ENCOUNTER — Other Ambulatory Visit: Payer: Self-pay | Admitting: *Deleted

## 2018-04-20 ENCOUNTER — Encounter: Payer: Self-pay | Admitting: Adult Health

## 2018-04-20 ENCOUNTER — Telehealth: Payer: Self-pay | Admitting: Adult Health

## 2018-04-20 ENCOUNTER — Ambulatory Visit (INDEPENDENT_AMBULATORY_CARE_PROVIDER_SITE_OTHER): Payer: Medicare Other | Admitting: Adult Health

## 2018-04-20 ENCOUNTER — Other Ambulatory Visit: Payer: Self-pay | Admitting: Neurology

## 2018-04-20 VITALS — BP 109/66 | HR 60 | Ht 68.0 in | Wt 157.4 lb

## 2018-04-20 DIAGNOSIS — G4733 Obstructive sleep apnea (adult) (pediatric): Secondary | ICD-10-CM | POA: Diagnosis not present

## 2018-04-20 DIAGNOSIS — F5104 Psychophysiologic insomnia: Secondary | ICD-10-CM | POA: Diagnosis not present

## 2018-04-20 DIAGNOSIS — I2510 Atherosclerotic heart disease of native coronary artery without angina pectoris: Secondary | ICD-10-CM | POA: Diagnosis not present

## 2018-04-20 DIAGNOSIS — G43109 Migraine with aura, not intractable, without status migrainosus: Secondary | ICD-10-CM | POA: Diagnosis not present

## 2018-04-20 DIAGNOSIS — Z9989 Dependence on other enabling machines and devices: Secondary | ICD-10-CM | POA: Diagnosis not present

## 2018-04-20 MED ORDER — ALPRAZOLAM ER 1 MG PO TB24: 1.0000 mg | ORAL_TABLET | Freq: Every day | ORAL | 5 refills | Status: DC

## 2018-04-20 NOTE — Telephone Encounter (Signed)
Drug resgistry checkd.  Last fill 03-21-18  #30 tabs.

## 2018-04-20 NOTE — Progress Notes (Signed)
[name]: Drew [name] DOB: [birth year]  REASON FOR VISIT: follow up HISTORY FROM: [name]  HISTORY OF PRESENT ILLNESS: Today 04/20/18 :  Drew [name] is a 72 year old male with a history of obstructive sleep apnea on CPAP, migraine headaches and insomnia.  He returns today for follow-up.  His CPAP download indicates that he uses machine nightly for compliance of 100%.  He uses machine greater than 4 hours 29 out of 30 days for compliance of 97%.  On average he uses his machine 7 hours and 44 minutes.  His residual AHI is 1.8 on 10 cm of water with EPR of 3.  He does not have a significant leak.  He feels that Drew CPAP continues to work well for him.  He states that he has been taking Xanax extended release 1 mg at bedtime.  He reports that there are times that he feels that he needs a stronger dose.  However he does not want to adjust his medicine at this time.  He continues to have approximately 2-3 headaches a month.  He states that this is a significant improvement.  He continues on Zonegran.  He will use Fioricet if needed.  He returns today for an evaluation.  HISTORY (copied from Drew [name] note) Drew [name] is a meanwhile 72 year old Caucasian gentleman whom I have followed now for about 2 years.  He presented with difficulties initiating and maintaining sleep was tested for obstructive sleep apnea and Drew results were positive.  He was quite apprehensive initially starting on CPAP but has meanwhile mastered Drew use very well and has benefited greatly.  He has become a very compliant [name] for Drew last 30 days, 8 hours of average nighttime use, with a CPAP set at 10 cmH2O with 3 cm EPR his residual AHI was 4.0 of which 2.1 apneas are obstructive in nature.  He does have some central apnea arising under treatment about 3% of Drew night.  For this reason I would not like to increase his pressure further. In cooperation with his counselor I have also provided alprazolam 1 mg extended  release which has helped with his chronic insomnia.  He has never abused Drew medication, and is not asking for early refills.  Occasionally he will suffer a headache that is best responding to butalbital, here again Drew last prescription is from July 28, 2017 and at this time he still has 10 of his 30 pills left. I migraine a week from 4 a week before treatment.  Drew [name] is also a [facility] Drew [name], and today we go through a questionnaire he was send.    REVIEW OF SYSTEMS: Out of a complete 14 system review of symptoms, Drew [name] complains only of Drew following symptoms, and all other reviewed systems are negative.  Epworth sleepiness score 1  ALLERGIES: Allergies  Allergen Reactions  . Mushroom Extract Complex     Severe vertigo, nausea, headache  . Propoxyphene Nausea And Vomiting    Headache, vertigo    HOME MEDICATIONS: Outpatient Medications Prior to Visit  Medication Sig Dispense Refill  . ALPRAZolam (XANAX XR) 1 MG 24 hr tablet Take 1 tablet (1 mg total) by mouth daily. 30 tablet 5  . amLODipine (NORVASC) 10 MG tablet TAKE 1 TABLET BY MOUTH DAILY **MUST CALL OFFICE AND SCHEDULE APPOINTMENT FOR FUTURE REFIILS. 2ND ATTEMPT** 90 tablet 3  . Ascorbic Acid (VITAMIN C) 1000 MG tablet Take 1,000 mg by  mouth 2 (two) times daily.    Marland Kitchen aspirin EC 81 MG tablet Take 81 mg by mouth daily.    . butalbital-aspirin-caffeine-codeine (FIORINAL WITH CODEINE) 50-325-40-30 MG capsule TAKE ONE CAPSULE BY MOUTH EVERY 4 HOURS AS NEEDED FOR PAIN 30 capsule 0  . metoprolol succinate (TOPROL-XL) 100 MG 24 hr tablet TAKE 1 TABLET BY MOUTH DAILY **CALL AND SCHEDULE APPOINTMENT FOR FUTURE REFILLS. 2ND ATTEMPT** 90 tablet 3  . Multiple Vitamins-Minerals (CENTRUM ADULTS PO) Take 1 tablet by mouth daily.    . niacin 500 MG tablet Take 500 mg by mouth at bedtime.    . pravastatin (PRAVACHOL) 40 MG tablet Take 1 tablet (40 mg total) by mouth every evening. 90 tablet 3    . tadalafil (CIALIS) 5 MG tablet Take 5 mg by mouth daily as needed for erectile dysfunction.    . Testosterone (FORTESTA TD) Place 2 Act onto Drew skin daily. 1.62%    . TURMERIC PO Take by mouth.    . vitamin E 400 UNIT capsule Take 400 Units by mouth daily.    Marland Kitchen zonisamide (ZONEGRAN) 25 MG capsule Take 2 capsules (50 mg total) by mouth daily. 180 capsule 3  . losartan (COZAAR) 25 MG tablet Take 1 tablet (25 mg total) by mouth daily. 90 tablet 3   No facility-administered medications prior to visit.     PAST MEDICAL HISTORY: Past Medical History:  Diagnosis Date  . Arthritis   . Bicuspid aortic valve    with mild to moderate AR by echo 10/2017  . BPH (benign prostatic hyperplasia)   . CAD (coronary artery disease)    s/p CABG [place] [place]  . Depression   . Dilated aortic root (HCC)    87mm aortic root and 3mm ascending aorta by echo 10/2017  . History of blood transfusion 1998   "due to cardiac bypass surgery"  . Hyperlipidemia   . Hypertension   . Hypogonadism in male   . Migraine   . Retinal hemorrhage 11/04/2014    PAST SURGICAL HISTORY: Past Surgical History:  Procedure Laterality Date  . CORONARY ARTERY BYPASS GRAFT  1998  . TEE WITHOUT CARDIOVERSION N/A 01/19/2015   Procedure: TRANSESOPHAGEAL ECHOCARDIOGRAM (TEE);  Surgeon: Drew Margarita, MD;  Location: [facility] ENDOSCOPY;  Service: Cardiovascular;  Laterality: N/A;  . VARICOSE VEIN SURGERY  1984    FAMILY HISTORY: Family History  Problem Relation Age of Onset  . Arthritis Mother        deceased  . Hyperlipidemia Mother   . Arthritis Father   . Hyperlipidemia Father   . Heart disease Father 48  . Stroke Father 93       deceased  . Hypertension Father   . Kidney disease Paternal Grandfather     SOCIAL HISTORY: Social History   Socioeconomic History  . Marital status: Divorced    Spouse name: Not on file  . Number of children: Not on file  . Years of education: Not on file  . Highest education  level: Not on file  Occupational History  . Not on file  Social Needs  . Financial resource strain: Not on file  . Food insecurity:    Worry: Not on file    Inability: Not on file  . Transportation needs:    Medical: Not on file    Non-medical: Not on file  Tobacco Use  . Smoking status: Former Smoker    Years: 20.00    Last attempt to quit: 07/22/1986  Years since quitting: 31.7  . Smokeless tobacco: Never Used  Substance and Sexual Activity  . Alcohol use: Yes    Comment: 3-4 weekly  . Drug use: No  . Sexual activity: Not on file  Lifestyle  . Physical activity:    Days per week: Not on file    Minutes per session: Not on file  . Stress: Not on file  Relationships  . Social connections:    Talks on phone: Not on file    Gets together: Not on file    Attends religious service: Not on file    Active member of club or organization: Not on file    Attends meetings of clubs or organizations: Not on file    Relationship status: Not on file  . Intimate partner violence:    Fear of current or ex partner: Not on file    Emotionally abused: Not on file    Physically abused: Not on file    Forced sexual activity: Not on file  Other Topics Concern  . Not on file  Social History Narrative   Divorced   Secondary school teacher- retired   No children   Has a Neurosurgeon named [name]   Enjoys outdoor activities- hiking, swimming, archeology, baseball, writing, reading, cooking      PHYSICAL EXAM  Vitals:   04/20/18 0824  BP: 109/66  Pulse: 60  Weight: 157 lb 6.4 oz (71.4 kg)  Height: 5\' 8"  (1.727 m)   Body mass index is 23.93 kg/m.  Generalized: Well developed, in no acute distress   Neurological examination  Mentation: Alert oriented to time, place, history taking. Follows all commands speech and language fluent Cranial nerve II-XII: Pupils were equal round reactive to light. Extraocular movements were full, visual field were full on confrontational test. Facial sensation and  strength were normal. Uvula tongue midline. Head turning and shoulder shrug  were normal and symmetric.  Neck circumference 15 inches, Mallampati 3+ Motor: Drew motor testing reveals 5 over 5 strength of all 4 extremities. Good symmetric motor tone is noted throughout.  Sensory: Sensory testing is intact to soft touch on all 4 extremities. No evidence of extinction is noted.  Coordination: Cerebellar testing reveals good finger-nose-finger and heel-to-shin bilaterally.  Gait and station: Gait is normal.  Reflexes: Deep tendon reflexes are symmetric and normal bilaterally.   DIAGNOSTIC DATA (LABS, IMAGING, TESTING) - I reviewed [name] records, labs, notes, testing and imaging myself where available.  Lab Results  Component Value Date   WBC 9.9 01/17/2015   HGB 13.4 01/17/2015   HCT 39.8 01/17/2015   MCV 101.0 (H) 01/17/2015   PLT 326.0 01/17/2015      Component Value Date/Time   NA 140 10/31/2017 0831   K 4.5 10/31/2017 0831   CL 104 10/31/2017 0831   CO2 23 10/31/2017 0831   GLUCOSE 119 (H) 10/31/2017 0831   GLUCOSE 114 (H) 01/09/2015 1529   BUN 22 10/31/2017 0831   CREATININE 1.31 (H) 10/31/2017 0831   CALCIUM 8.9 10/31/2017 0831   PROT 6.7 10/05/2014 0931   ALBUMIN 4.3 10/05/2014 0931   AST 23 10/05/2014 0931   ALT 18 12/08/2017 0839   ALKPHOS 92 10/05/2014 0931   BILITOT 0.4 10/05/2014 0931   GFRNONAA 55 (L) 10/31/2017 0831   GFRAA 64 10/31/2017 0831   Lab Results  Component Value Date   CHOL 116 12/08/2017   HDL 37 (L) 12/08/2017   LDLCALC 55 12/08/2017   TRIG 122 12/08/2017   CHOLHDL  3.1 12/08/2017   Lab Results  Component Value Date   HGBA1C 5.9 10/05/2014      ASSESSMENT AND PLAN 72 y.o. year old male  has a past medical history of Arthritis, Bicuspid aortic valve, BPH (benign prostatic hyperplasia), CAD (coronary artery disease), Depression, Dilated aortic root ([place]), History of blood transfusion (1998), Hyperlipidemia, Hypertension, Hypogonadism in male,  Migraine, and Retinal hemorrhage (11/04/2014). here with:  1.  Obstructive sleep apnea on CPAP 2.  Insomnia 3.  Migraine headaches  Overall Drew [name] CPAP download shows excellent compliance and good treatment of his apnea.  He will continue on Xanax extended release 1 mg at bedtime.  In Drew future this dose may need to be adjusted or a different medication may need to be prescribed.  He will continue on Zonegran and Fioricet.  I have advised that if his symptoms worsen or he develops new symptoms he should let us know.  He will follow-up in 6 months or sooner if needed.   [name], MSN, NP-C 04/20/2018, 8:33 AM [facility] [address], [place], [place] [address] [phone]

## 2018-04-20 NOTE — Telephone Encounter (Signed)
MM/NP redid, escribed to pharmacy.  Printed script shredded.

## 2018-04-20 NOTE — Telephone Encounter (Signed)
Patient forgot to mention to [facility] that he needs refills for Xanax 1 mg tab. Best call back is [phone]

## 2018-04-20 NOTE — Telephone Encounter (Signed)
Refill done.  

## 2018-04-20 NOTE — Patient Instructions (Signed)
Your Plan:  Continue using CPAP nightly  Continue Zonegran and fioricet Continue Xanax ER 1 mg  If your symptoms worsen or you develop new symptoms please let us know.   Thank you for coming to see [place] at [facility]. I hope we have been able to provide you high quality care today.  You may receive a patient satisfaction survey over the next few weeks. We would appreciate your feedback and comments so that we may continue to improve ourselves and the health of our patients.

## 2018-04-20 NOTE — Addendum Note (Signed)
Addended by: Trudie Buckler on: 04/20/2018 10:24 AM   Modules accepted: Orders

## 2018-06-24 ENCOUNTER — Other Ambulatory Visit: Payer: Self-pay | Admitting: Neurology

## 2018-06-24 MED ORDER — ZONISAMIDE 25 MG PO CAPS: 50.0000 mg | ORAL_CAPSULE | Freq: Every day | ORAL | 3 refills | Status: DC

## 2018-06-25 ENCOUNTER — Other Ambulatory Visit: Payer: Self-pay | Admitting: Cardiology

## 2018-06-25 MED ORDER — AMLODIPINE BESYLATE 10 MG PO TABS: ORAL_TABLET | ORAL | 0 refills | Status: DC

## 2018-06-25 MED ORDER — PRAVASTATIN SODIUM 40 MG PO TABS: 40.0000 mg | ORAL_TABLET | Freq: Every evening | ORAL | 0 refills | Status: DC

## 2018-06-25 MED ORDER — METOPROLOL SUCCINATE ER 100 MG PO TB24: ORAL_TABLET | ORAL | 0 refills | Status: DC

## 2018-07-17 ENCOUNTER — Encounter: Payer: Self-pay | Admitting: Neurology

## 2018-07-20 ENCOUNTER — Other Ambulatory Visit: Payer: Self-pay | Admitting: Neurology

## 2018-08-06 ENCOUNTER — Encounter: Payer: Self-pay | Admitting: Adult Health

## 2018-08-10 MED ORDER — BUTALBITAL-ASA-CAFF-CODEINE 50-325-40-30 MG PO CAPS: ORAL_CAPSULE | ORAL | 0 refills | Status: DC

## 2018-08-17 ENCOUNTER — Encounter: Payer: Self-pay | Admitting: Gastroenterology

## 2018-08-21 ENCOUNTER — Encounter (HOSPITAL_COMMUNITY): Payer: Self-pay | Admitting: *Deleted

## 2018-08-21 ENCOUNTER — Inpatient Hospital Stay (HOSPITAL_COMMUNITY)
Admission: EM | Admit: 2018-08-21 | Discharge: 2018-08-25 | DRG: 281 | Disposition: A | Discharge status: Home / Self Care | Payer: Medicare Other | Attending: Cardiovascular Disease | Admitting: Cardiovascular Disease

## 2018-08-21 ENCOUNTER — Emergency Department (HOSPITAL_COMMUNITY): Payer: Medicare Other

## 2018-08-21 DIAGNOSIS — Z87891 Personal history of nicotine dependence: Secondary | ICD-10-CM

## 2018-08-21 DIAGNOSIS — Z91018 Allergy to other foods: Secondary | ICD-10-CM

## 2018-08-21 DIAGNOSIS — I700 Atherosclerosis of aorta: Secondary | ICD-10-CM | POA: Diagnosis present

## 2018-08-21 DIAGNOSIS — F329 Major depressive disorder, single episode, unspecified: Secondary | ICD-10-CM | POA: Diagnosis present

## 2018-08-21 DIAGNOSIS — E785 Hyperlipidemia, unspecified: Secondary | ICD-10-CM | POA: Diagnosis present

## 2018-08-21 DIAGNOSIS — Z8261 Family history of arthritis: Secondary | ICD-10-CM

## 2018-08-21 DIAGNOSIS — Q231 Congenital insufficiency of aortic valve: Secondary | ICD-10-CM

## 2018-08-21 DIAGNOSIS — I25810 Atherosclerosis of coronary artery bypass graft(s) without angina pectoris: Secondary | ICD-10-CM | POA: Diagnosis present

## 2018-08-21 DIAGNOSIS — Z951 Presence of aortocoronary bypass graft: Secondary | ICD-10-CM | POA: Diagnosis not present

## 2018-08-21 DIAGNOSIS — I214 Non-ST elevation (NSTEMI) myocardial infarction: Secondary | ICD-10-CM | POA: Diagnosis present

## 2018-08-21 DIAGNOSIS — R9431 Abnormal electrocardiogram [ECG] [EKG]: Secondary | ICD-10-CM

## 2018-08-21 DIAGNOSIS — Z7982 Long term (current) use of aspirin: Secondary | ICD-10-CM

## 2018-08-21 DIAGNOSIS — I712 Thoracic aortic aneurysm, without rupture: Secondary | ICD-10-CM | POA: Diagnosis present

## 2018-08-21 DIAGNOSIS — Z8249 Family history of ischemic heart disease and other diseases of the circulatory system: Secondary | ICD-10-CM

## 2018-08-21 DIAGNOSIS — F411 Generalized anxiety disorder: Secondary | ICD-10-CM | POA: Diagnosis present

## 2018-08-21 DIAGNOSIS — I129 Hypertensive chronic kidney disease with stage 1 through stage 4 chronic kidney disease, or unspecified chronic kidney disease: Secondary | ICD-10-CM | POA: Diagnosis present

## 2018-08-21 DIAGNOSIS — M1990 Unspecified osteoarthritis, unspecified site: Secondary | ICD-10-CM | POA: Diagnosis present

## 2018-08-21 DIAGNOSIS — Z791 Long term (current) use of non-steroidal anti-inflammatories (NSAID): Secondary | ICD-10-CM | POA: Diagnosis not present

## 2018-08-21 DIAGNOSIS — R011 Cardiac murmur, unspecified: Secondary | ICD-10-CM | POA: Diagnosis present

## 2018-08-21 DIAGNOSIS — N400 Enlarged prostate without lower urinary tract symptoms: Secondary | ICD-10-CM | POA: Diagnosis present

## 2018-08-21 DIAGNOSIS — Z823 Family history of stroke: Secondary | ICD-10-CM

## 2018-08-21 DIAGNOSIS — I351 Nonrheumatic aortic (valve) insufficiency: Secondary | ICD-10-CM | POA: Diagnosis present

## 2018-08-21 DIAGNOSIS — Z79899 Other long term (current) drug therapy: Secondary | ICD-10-CM | POA: Diagnosis not present

## 2018-08-21 DIAGNOSIS — Z8349 Family history of other endocrine, nutritional and metabolic diseases: Secondary | ICD-10-CM

## 2018-08-21 DIAGNOSIS — D631 Anemia in chronic kidney disease: Secondary | ICD-10-CM | POA: Diagnosis present

## 2018-08-21 DIAGNOSIS — I2510 Atherosclerotic heart disease of native coronary artery without angina pectoris: Secondary | ICD-10-CM | POA: Diagnosis present

## 2018-08-21 DIAGNOSIS — G4733 Obstructive sleep apnea (adult) (pediatric): Secondary | ICD-10-CM | POA: Diagnosis present

## 2018-08-21 DIAGNOSIS — I10 Essential (primary) hypertension: Secondary | ICD-10-CM | POA: Diagnosis present

## 2018-08-21 DIAGNOSIS — N183 Chronic kidney disease, stage 3 (moderate): Secondary | ICD-10-CM | POA: Diagnosis present

## 2018-08-21 LAB — CBC
HEMATOCRIT: 36.4 % — AB (ref 39.0–52.0)
Hemoglobin: 12.3 g/dL — ABNORMAL LOW (ref 13.0–17.0)
MCH: 34.8 pg — AB (ref 26.0–34.0)
MCHC: 33.8 g/dL (ref 30.0–36.0)
MCV: 103.1 fL — ABNORMAL HIGH (ref 80.0–100.0)
Platelets: 269 10*3/uL (ref 150–400)
RBC: 3.53 MIL/uL — ABNORMAL LOW (ref 4.22–5.81)
RDW: 12.4 % (ref 11.5–15.5)
WBC: 10.3 10*3/uL (ref 4.0–10.5)
nRBC: 0 % (ref 0.0–0.2)

## 2018-08-21 LAB — COMPREHENSIVE METABOLIC PANEL
ALT: 34 U/L (ref 0–44)
AST: 59 U/L — AB (ref 15–41)
Albumin: 3.8 g/dL (ref 3.5–5.0)
Alkaline Phosphatase: 102 U/L (ref 38–126)
Anion gap: 11 (ref 5–15)
BUN: 14 mg/dL (ref 8–23)
CHLORIDE: 109 mmol/L (ref 98–111)
CO2: 23 mmol/L (ref 22–32)
Calcium: 8.8 mg/dL — ABNORMAL LOW (ref 8.9–10.3)
Creatinine, Ser: 1.41 mg/dL — ABNORMAL HIGH (ref 0.61–1.24)
GFR calc Af Amer: 58 mL/min — ABNORMAL LOW (ref >60)
GFR calc non Af Amer: 50 mL/min — ABNORMAL LOW (ref >60)
Glucose, Bld: 113 mg/dL — ABNORMAL HIGH (ref 70–99)
Potassium: 3.4 mmol/L — ABNORMAL LOW (ref 3.5–5.1)
Sodium: 143 mmol/L (ref 135–145)
Total Bilirubin: 0.6 mg/dL (ref 0.3–1.2)
Total Protein: 6.6 g/dL (ref 6.5–8.1)

## 2018-08-21 LAB — I-STAT TROPONIN, ED
Troponin i, poc: 3.31 ng/mL (ref 0.00–0.08)
Troponin i, poc: 2.68 ng/mL (ref 0.00–0.08)

## 2018-08-21 LAB — PROTIME-INR
INR: 1.05
Prothrombin Time: 13.7 seconds (ref 11.4–15.2)

## 2018-08-21 LAB — MAGNESIUM: Magnesium: 2 mg/dL (ref 1.7–2.4)

## 2018-08-21 MED ORDER — LOSARTAN POTASSIUM 25 MG PO TABS
25.0000 mg | ORAL_TABLET | Freq: Every day | ORAL | Status: DC
  Administered 2018-08-22 – 2018-08-25 (×4): 25 mg via ORAL
  Filled 2018-08-21 (×4): qty 1

## 2018-08-21 MED ORDER — ALPRAZOLAM 0.5 MG PO TABS
1.0000 mg | ORAL_TABLET | Freq: Every day | ORAL | Status: DC
  Administered 2018-08-21 – 2018-08-24 (×4): 1 mg via ORAL
  Filled 2018-08-21 (×4): qty 2

## 2018-08-21 MED ORDER — FUROSEMIDE 10 MG/ML IJ SOLN
40.0000 mg | Freq: Once | INTRAMUSCULAR | Status: AC
  Administered 2018-08-21: 40 mg via INTRAVENOUS
  Filled 2018-08-21: qty 4

## 2018-08-21 MED ORDER — ACETAMINOPHEN 325 MG PO TABS
650.0000 mg | ORAL_TABLET | ORAL | Status: DC | PRN
  Administered 2018-08-22 – 2018-08-23 (×3): 650 mg via ORAL
  Filled 2018-08-21 (×3): qty 2

## 2018-08-21 MED ORDER — HEPARIN (PORCINE) 25000 UT/250ML-% IV SOLN
1350.0000 [IU]/h | INTRAVENOUS | Status: DC
  Administered 2018-08-21: 850 [IU]/h via INTRAVENOUS
  Administered 2018-08-22: 1150 [IU]/h via INTRAVENOUS
  Administered 2018-08-23 – 2018-08-24 (×2): 1350 [IU]/h via INTRAVENOUS
  Filled 2018-08-21 (×4): qty 250

## 2018-08-21 MED ORDER — SODIUM CHLORIDE 0.9 % WEIGHT BASED INFUSION: 1.0000 mL/kg/h | INTRAVENOUS | Status: DC

## 2018-08-21 MED ORDER — AMLODIPINE BESYLATE 10 MG PO TABS
10.0000 mg | ORAL_TABLET | Freq: Every day | ORAL | Status: DC
  Administered 2018-08-22 – 2018-08-25 (×4): 10 mg via ORAL
  Filled 2018-08-21 (×4): qty 1

## 2018-08-21 MED ORDER — ONDANSETRON HCL 4 MG/2ML IJ SOLN: 4.0000 mg | Freq: Four times a day (QID) | INTRAMUSCULAR | Status: DC | PRN

## 2018-08-21 MED ORDER — NITROGLYCERIN 0.4 MG SL SUBL
0.4000 mg | SUBLINGUAL_TABLET | SUBLINGUAL | Status: DC | PRN
  Administered 2018-08-22: 0.4 mg via SUBLINGUAL
  Filled 2018-08-21: qty 1

## 2018-08-21 MED ORDER — NITROGLYCERIN 0.4 MG SL SUBL: 0.4000 mg | SUBLINGUAL_TABLET | SUBLINGUAL | Status: DC | PRN

## 2018-08-21 MED ORDER — METOPROLOL SUCCINATE ER 100 MG PO TB24
100.0000 mg | ORAL_TABLET | Freq: Every day | ORAL | Status: DC
  Administered 2018-08-21 – 2018-08-24 (×4): 100 mg via ORAL
  Filled 2018-08-21 (×4): qty 1

## 2018-08-21 MED ORDER — SODIUM CHLORIDE 0.9 % IV SOLN: 250.0000 mL | INTRAVENOUS | Status: DC | PRN

## 2018-08-21 MED ORDER — PRAVASTATIN SODIUM 40 MG PO TABS
40.0000 mg | ORAL_TABLET | Freq: Every evening | ORAL | Status: DC
  Administered 2018-08-22 – 2018-08-23 (×2): 40 mg via ORAL
  Filled 2018-08-21 (×2): qty 1

## 2018-08-21 MED ORDER — POTASSIUM CHLORIDE CRYS ER 20 MEQ PO TBCR
40.0000 meq | EXTENDED_RELEASE_TABLET | Freq: Once | ORAL | Status: AC
  Administered 2018-08-21: 40 meq via ORAL
  Filled 2018-08-21: qty 2

## 2018-08-21 MED ORDER — SODIUM CHLORIDE 0.9 % WEIGHT BASED INFUSION: 3.0000 mL/kg/h | INTRAVENOUS | Status: DC

## 2018-08-21 MED ORDER — SODIUM CHLORIDE 0.9% FLUSH: 3.0000 mL | INTRAVENOUS | Status: DC | PRN

## 2018-08-21 MED ORDER — HEPARIN SODIUM (PORCINE) 5000 UNIT/ML IJ SOLN
4000.0000 [IU] | Freq: Once | INTRAMUSCULAR | Status: AC
  Administered 2018-08-21: 4000 [IU] via INTRAVENOUS
  Filled 2018-08-21: qty 1

## 2018-08-21 MED ORDER — SODIUM CHLORIDE 0.9% FLUSH: 3.0000 mL | Freq: Once | INTRAVENOUS | Status: DC

## 2018-08-21 MED ORDER — ASPIRIN 81 MG PO CHEW: 81.0000 mg | CHEWABLE_TABLET | Freq: Once | ORAL | Status: DC

## 2018-08-21 MED ORDER — ZONISAMIDE 25 MG PO CAPS
50.0000 mg | ORAL_CAPSULE | Freq: Every day | ORAL | Status: DC
  Administered 2018-08-22 – 2018-08-25 (×4): 50 mg via ORAL
  Filled 2018-08-21 (×4): qty 2

## 2018-08-21 MED ORDER — NIACIN 500 MG PO TABS
500.0000 mg | ORAL_TABLET | Freq: Every day | ORAL | Status: DC
  Administered 2018-08-22 – 2018-08-24 (×3): 500 mg via ORAL
  Filled 2018-08-21 (×4): qty 1

## 2018-08-21 MED ORDER — SODIUM CHLORIDE 0.9% FLUSH
3.0000 mL | Freq: Two times a day (BID) | INTRAVENOUS | Status: DC
  Administered 2018-08-22 – 2018-08-23 (×2): 3 mL via INTRAVENOUS

## 2018-08-21 MED ORDER — ASPIRIN EC 81 MG PO TBEC
81.0000 mg | DELAYED_RELEASE_TABLET | Freq: Every day | ORAL | Status: DC
  Administered 2018-08-22 – 2018-08-24 (×3): 81 mg via ORAL
  Filled 2018-08-21 (×3): qty 1

## 2018-08-21 MED ORDER — DIAZEPAM 5 MG PO TABS
2.5000 mg | ORAL_TABLET | Freq: Every day | ORAL | Status: DC | PRN
  Administered 2018-08-22 – 2018-08-24 (×3): 5 mg via ORAL
  Filled 2018-08-21 (×3): qty 1

## 2018-08-21 NOTE — ED Triage Notes (Signed)
The pt arrived by gems from home.  He had chest pain last night none today  He saw his regular doctor and lab was drawn.  He was called at home and was told that his tro was ekleavted no chedst pain now

## 2018-08-21 NOTE — ED Provider Notes (Signed)
[facility] EMERGENCY DEPARTMENT Provider Note   CSN: [id number] Arrival date & time: 08/21/18  1758     History   Chief Complaint Chief Complaint  Patient presents with  . Abnormal Lab    HPI [name] is a 73 y.o. male.  HPI   [name] is a 73 y.o. male with PMH of 69s, bicuspid aortic valve, BPH, CAD status post believed to be 2 vessel CABG in 1998 in [place] and follows with Dr. Radford Pax, history of dilated aortic root, history of HLD, HTN, hypogonadism, migraine, and retinal hemorrhage who presents with concern for elevated troponin drawn in PCPs office this morning.  Patient reports that since his bypass in 1998 he has been very active and has had no recurrent chest pain.  Last night, he went to a meeting in downtown [place] and while walking downtown he developed pressure sensation in his left chest that radiated slightly up to his neck and jaw on the left side.  Not associated with lightheadedness or diaphoresis and no nausea or vomiting.  He was not short of breath at that time.  His symptoms lasted 2 or 3 hours and then resolved.  When he awoke today he felt normal.  Has not had any recurrence of pain until the time of my interview with this patient at around 6:15 PM.  He states that he was told his troponin was 8.8 after it was drawn in clinic this morning and he was counseled to come to the emergency department.  He had taken his 81 mg daily aspirin this morning but takes no other antiplatelets and no anticoagulants.  He has mild chest pressure during the time of my evaluation.  No recent falls or trauma.  Follows annually with CT [facility] at [facility] for dilated aortic root in the setting of bicuspid aortic valve.  Past Medical History:  Diagnosis Date  . Arthritis   . Bicuspid aortic valve    with mild to moderate AR by echo 10/2017  . BPH (benign prostatic hyperplasia)   . CAD (coronary artery disease)    s/p CABG [place]  .  Depression   . Dilated aortic root (HCC)    63mm aortic root and 51mm ascending aorta by echo 10/2017  . History of blood transfusion 1998   "due to cardiac bypass surgery"  . Hyperlipidemia   . Hypertension   . Hypogonadism in male   . Migraine   . Retinal hemorrhage 11/04/2014    Patient Active Problem List   Diagnosis Date Noted  . Migraine with aura and without status migrainosus, not intractable 10/14/2017  . Generalized anxiety disorder 10/10/2016  . OSA on CPAP 10/10/2016  . Bicuspid aortic valve   . 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 Aortic insufficiency 01/19/2015  . Ascending aortic aneurysm ([place]) 01/19/2015  . Retinal hemorrhage 11/04/2014  . Insomnia 11/04/2014  . HTN (hypertension) 09/21/2014  . Hyperlipidemia 09/21/2014  . CAD (coronary artery disease) 09/21/2014  . History of migraine 09/21/2014  . Hypogonadism in male 09/21/2014  . BPH (benign prostatic hyperplasia) 09/21/2014  . Anxiety and depression 09/21/2014    Past Surgical History:  Procedure Laterality Date  . CORONARY ARTERY BYPASS GRAFT  1998  . TEE WITHOUT CARDIOVERSION N/A 01/19/2015   Procedure: TRANSESOPHAGEAL ECHOCARDIOGRAM (TEE);  Surgeon: Sueanne Margarita, MD;  Location: [facility];  Service: Cardiovascular;  Laterality: N/A;  . Startup  Home Medications    Prior to Admission medications   Medication Sig Start Date End Date Taking? Authorizing Provider  ALPRAZolam (XANAX XR) 1 MG 24 hr tablet Take 1 tablet (1 mg total) by mouth daily. Patient taking differently: Take 1 mg by mouth at bedtime.  04/20/18  Yes [place], Jinny Blossom, NP  amLODipine (NORVASC) 10 MG tablet TAKE 1 TABLET BY MOUTH DAILY. Please make yearly appt with Dr. Radford Pax for February for future refills. 1st attempt Patient taking differently: Take 10 mg by mouth daily.  06/25/18  Yes Turner, Eber Hong, MD  Ascorbic Acid (VITAMIN C) 1000 MG tablet Take 1,000 mg by mouth 2 (two) times daily.   Yes  [provider]  aspirin EC 81 MG tablet Take 81 mg by mouth daily.   Yes [provider]  aspirin-acetaminophen-caffeine (EXCEDRIN EXTRA STRENGTH) [phone] MG tablet Take 2 tablets by mouth every 6 (six) hours as needed for headache.   Yes [provider]  butalbital-aspirin-caffeine-codeine ([place]) 50-325-40-30 MG capsule TAKE ONE CAPSULE BY MOUTH EVERY 4 HOURS AS NEEDED FOR PAIN Patient taking differently: Take 2 capsules by mouth every 4 (four) hours as needed for migraine.  08/10/18  Yes [name], NP  diazepam (VALIUM) 5 MG tablet Take 2.5-5 mg by mouth daily as needed for anxiety. 10/05/14  Yes [provider]  ibuprofen (ADVIL,MOTRIN) 200 MG tablet Take 200-400 mg by mouth daily as needed (for arthritis in the feet).   Yes [provider]  losartan (COZAAR) 25 MG tablet Take 1 tablet (25 mg total) by mouth daily. 10/14/17 08/21/18 Yes Turner, Eber Hong, MD  metoprolol succinate (TOPROL-XL) 100 MG 24 hr tablet TAKE 1 TABLET BY MOUTH DAILY. Please make yearly appt with Dr. Radford Pax for February for future refills. 1st attempt Patient taking differently: Take 100 mg by mouth at bedtime.  06/25/18  Yes Turner, Eber Hong, MD  niacin 500 MG tablet Take 500 mg by mouth at bedtime.   Yes [provider]  pravastatin (PRAVACHOL) 40 MG tablet Take 1 tablet (40 mg total) by mouth every evening. Please make yearly appt with Dr. Radford Pax for February for future refills. 1st attempt Patient taking differently: Take 40 mg by mouth every evening.  06/25/18  Yes Sueanne Margarita, MD  Specialty Vitamins Products (CENTRUM SPECIALIST ENERGY) TABS Take 1 tablet by mouth daily with breakfast.   Yes [provider]  tadalafil (CIALIS) 5 MG tablet Take 5 mg by mouth daily.    Yes [provider]  Testosterone (ANDROGEL) 40.5 MG/2.5GM (1.62%) GEL Place 1 application onto the skin See admin instructions. Apply 1 pump to each shoulder once  a day   Yes [provider]  TURMERIC PO Take 1 capsule by mouth daily with breakfast.    Yes [provider]  vitamin E 400 UNIT capsule Take 400 Units by mouth at bedtime.    Yes [provider]  zonisamide (ZONEGRAN) 25 MG capsule Take 2 capsules (50 mg total) by mouth daily. 06/24/18  Yes Dohmeier, Asencion Partridge, MD    Family History Family History  Problem Relation Age of Onset  . Arthritis Mother        deceased  . Hyperlipidemia Mother   . Arthritis Father   . Hyperlipidemia Father   . Heart disease Father 5  . Stroke Father 51       deceased  . Hypertension Father   . Kidney disease Paternal Grandfather     Social History Social  History   Tobacco Use  . Smoking status: Former Smoker    Years: 20.00    Last attempt to quit: 07/22/1986    Years since quitting: 32.1  . Smokeless tobacco: Never Used  Substance Use Topics  . Alcohol use: Yes    Comment: 3-4 weekly  . Drug use: No     Allergies   Mushroom extract complex   Review of Systems Review of Systems  Constitutional: Negative for chills and fever.  HENT: Negative for ear pain and sore throat.   Eyes: Negative for pain and visual disturbance.  Respiratory: Negative for cough and shortness of breath.   Cardiovascular: Positive for chest pain. Negative for palpitations.  Gastrointestinal: Negative for abdominal pain and vomiting.  Genitourinary: Negative for dysuria and hematuria.  Musculoskeletal: Negative for arthralgias and back pain.  Skin: Negative for color change and rash.  Neurological: Negative for seizures and syncope.  All other systems reviewed and are negative.    Physical Exam Updated Vital Signs BP 114/64   Pulse (!) 58   Resp 14   Ht 5\' 7"  (1.702 m)   Wt 72.6 kg   SpO2 95%   BMI 25.06 kg/m   Physical Exam Vitals signs and nursing note reviewed.  Constitutional:      Appearance: Normal appearance. He is well-developed. He is not diaphoretic.  HENT:      Head: Normocephalic and atraumatic.  Eyes:     Conjunctiva/sclera: Conjunctivae normal.  Neck:     Musculoskeletal: Neck supple.  Cardiovascular:     Rate and Rhythm: Normal rate and regular rhythm.     Pulses:          Radial pulses are 2+ on the right side and 2+ on the left side.     Heart sounds: Murmur present. Systolic murmur present with a grade of 1/6.  Pulmonary:     Effort: Pulmonary effort is normal. No respiratory distress.     Breath sounds: Normal breath sounds.  Abdominal:     General: Abdomen is flat.     Palpations: Abdomen is soft.     Tenderness: There is no abdominal tenderness.  Skin:    General: Skin is warm and dry.  Neurological:     General: No focal deficit present.     Mental Status: He is alert and oriented to person, place, and time.     GCS: GCS eye subscore is 4. GCS verbal subscore is 5. GCS motor subscore is 6.     Cranial Nerves: Cranial nerves are intact.     Sensory: Sensation is intact.  Psychiatric:        Behavior: Behavior is cooperative.    ED Treatments / Results  Labs (all labs ordered are listed, but only abnormal results are displayed) Labs Reviewed  CBC - Abnormal; Notable for the following components:      Result Value   RBC 3.53 (*)    Hemoglobin 12.3 (*)    HCT 36.4 (*)    MCV 103.1 (*)    MCH 34.8 (*)    All other components within normal limits  COMPREHENSIVE METABOLIC PANEL - Abnormal; Notable for the following components:   Potassium 3.4 (*)    Glucose, Bld 113 (*)    Creatinine, Ser 1.41 (*)    Calcium 8.8 (*)    AST 59 (*)    GFR calc non Af Amer 50 (*)    GFR calc Af Amer 58 (*)    All  other components within normal limits  I-STAT TROPONIN, ED - Abnormal; Notable for the following components:   Troponin i, poc 3.31 (*)    All other components within normal limits  PROTIME-INR  MAGNESIUM  HEPARIN LEVEL (UNFRACTIONATED)  CBC    EKG EKG Interpretation  Date/Time:  Friday August 21 2018 18:07:09  EST Ventricular Rate:  60 PR Interval:    QRS Duration: 93 QT Interval:  440 QTC Calculation: 440 R Axis:   122 Text Interpretation:  Sinus rhythm Atrial premature complexes Probable right ventricular hypertrophy Lateral infarct, old No old tracing to compare Confirmed by [place], Inocente Salles [phone]) on 08/21/2018 6:23:29 PM   Radiology Dg Chest 2 View  Result Date: 08/21/2018 CLINICAL DATA:  Chest pain starting last night. Elevated troponin. EXAM: CHEST - 2 VIEW COMPARISON:  None. FINDINGS: Postoperative changes in the mediastinum. Borderline heart size and pulmonary vascularity. No airspace disease or consolidation in the lungs. Perihilar streaky opacities likely representing chronic bronchitis or reactive airways disease. No blunting of costophrenic angles. No pneumothorax. Calcification of the aorta. IMPRESSION: Chronic bronchitic changes in the lungs. No evidence of active pulmonary disease. Electronically Signed   By: Lucienne Capers M.D.   On: 08/21/2018 19:12    Procedures Procedures (including critical care time)  Medications Ordered in ED Medications  sodium chloride flush (NS) 0.9 % injection 3 mL (has no administration in time range)  aspirin chewable tablet 81 mg (81 mg Oral Not Given 08/21/18 1829)  heparin ADULT infusion 100 units/mL (25000 units/271mL sodium chloride 0.45%) (850 Units/hr Intravenous New Bag/Given 08/21/18 1938)  potassium chloride SA (K-DUR,KLOR-CON) CR tablet 40 mEq (has no administration in time range)  heparin injection 4,000 Units (4,000 Units Intravenous Given 08/21/18 1940)     Initial Impression / Assessment and Plan / ED Course  I have reviewed the triage vital signs and the nursing notes.  Pertinent labs & imaging results that were available during my care of the patient were reviewed by me and considered in my medical decision making (see chart for details).     MDM:  Imaging: Chest x-ray shows chronic bronchitic changes in the lungs with no  evidence for active pulmonary disease.  ED Provider Interpretation of EKG: Sinus rhythm with a rate of 60 bpm, normal axis, very subtle ST segment elevation in aVR which is isolated.  About 1 mm ST segment depression in leads II and aVF, V4 through V6.  These appear to be new compared to prior EKGs back to 2016.  Intervals normal.  No bundle-branch blocks.  No delta wave.  Labs: Magnesium 2.0, CMP with a potassium of 3.4 and creatinine 1.4 (at baseline) otherwise unremarkable, INR 1.05, CBC unremarkable, troponin III 0.31  On initial evaluation, patient appears stable. Afebrile and hemodynamically stable. Alert and oriented x4, pleasant, and cooperative.  Presents with chest pressure and positive troponin as detailed above.  On exam, patient has no focal abnormality.  EKG shows ST segment depressions that appear to be new as detailed above.  While there is subtle ST segment elevation in aVR the patient has only minimal discomfort at this time and it does not meet criteria for STEMI or STEMI equivalent.  Troponin reportedly 8.8 in his clinic earlier today when he was pain-free although he had story consistent with typical chest pain yesterday evening.  He was reportedly told EKG was normal this morning.  His pain returned during my conversation before troponin had resulted but he was not diaphoretic and had no additional  concomitant symptoms.  This pain was fleeting and patient stated he had no recurrent pain.  The patient is on Cialis at home and without pain in the ED nitroglycerin was held.  Given full dose aspirin and heparin bolus.  Started on heparin infusion.  Chest x-ray shows no acute pathology.  Patient has had stable aortic root on multiple recent CT scans reviewed under care everywhere.  Most recently in November 2019.  He has no back pain or pulse discrepancy and no neuro deficit.  Low suspicion for dissection at this time.  Troponin down to 3.31 in the ED from reported elevation of around 8  although this value is not visible in care everywhere.  No evidence for pneumonia or pneumothorax.  Low suspicion for pericarditis.  Low suspicion for myocarditis.    Highest suspicion for non-ST segment elevation MI.  General cardiology consulted and recommended discussing with interventionalist on-call.  Spoke to Dr. Alvester Chou, interventionalist on-call, and does not recommend emergent cath.  Recommends medical management over the weekend with heart catheterization on Monday.  Recommended admission to general cardiology service and I spoke to the noninterventional list thereafter, Dr. Harl Bowie, who evaluated patient in the ED.  He was admitted to their service thereafter.  The plan for this patient was discussed with Dr. Winfred Leeds who voiced agreement and who oversaw evaluation and treatment of this patient.   The patient was fully informed and involved with the history taking, evaluation, workup including labs/images, and plan. The patient's concerns and questions were addressed to the patient's satisfaction and he expressed agreement with the plan to admit.    Final Clinical Impressions(s) / ED Diagnoses   Final diagnoses:  NSTEMI (non-ST elevated myocardial infarction) ([place])  ST segment depression    ED Discharge Orders    None       Skie Vitrano, Rodena Goldmann, MD 08/21/18 2012    Orlie Dakin, MD 08/22/18 0020

## 2018-08-21 NOTE — H&P (Addendum)
Cardiology Admission History and Physical:   [name] ID: [name] MRN: [id number]; DOB: 1945/09/03   Admission date: 08/21/2018  Primary Care Provider: Burman Freestone, MD Primary Cardiologist: Fransico Him, MD  Primary Electrophysiologist:  None   Chief Complaint:  Chest Pain  [name] Profile:   [name] is a 73 y.o. male with history of CAD with CABG in 1998, bicuspid AV with moderate to severe AI and dilated aortic root measuring 4.8cm in diameter by TEE 12/2014, hypertension, hyperlipidemia, remote tobacco use (quit in 1988), and CKD stage III followed by Dr. Clementeen Graham at [facility] who presented to the [facility] ED for evaluation of chest pain.   History of Present Illness:   [name] is a 73 year old male with the above history who is followed by Dr. Radford Pax. [name] has been doing well from a cardiac standpoint and has been in his usual state of health until yesterday when he developed substernal chest pain with some radiation to left jaw and left arm while walking to a work meeting in [place]. Pain started around 6pm and lasted until 9pm, resolving without treatment. No associated diaphoresis, shortness of breath, nausea/vomiting, or lightheadedness/dizziness with the pain. He reports some mild pedal edema due to osteoarthritis but denies any orthopnea or PND. No recent fevers or illnesses. He drove back to [place] last night where he lived and went to see his PCP today for evaluation of his symptoms. PCP checked an EKG and a troponin. Later this evening, [name] got a call from his PCP that his troponin was elevated and was told he needed to call 911 to be transported to the ED for further evaluation. At time of this evaluation [name] reports some minimal chest discomfort that the describes as a "slight sensation of tightness."  In the ED: EKG showed no acute ischemic changes compared to prior tracings. I-stat troponin elevated at 3.31. Chest x-ray showed no acute findings. CBC  10.3, Hgb 12.3, Plts 269. Na 143, K 3.4, Glucose 113, SCr 1.41 (around baseline).   [name] has a remote history of tobacco use but quit in 1998. He has a max 3-4 drinks of alcohol per week. He does report using marijuana a couple of times a week. He states he last used marijuana 3-4 days ago. [name] has a family history of heart disease with both his father and mother having heart attacks.   Of note, [name] was having some abdominal pain a few weeks ago for which he saw his PCP for. As part of the workup, PCP ordered a hemoccult which came back positive. [name] scheduled to see GI next week.   Past Medical History:  Diagnosis Date  . Arthritis   . Bicuspid aortic valve    with mild to moderate AR by echo 10/2017  . BPH (benign prostatic hyperplasia)   . CAD (coronary artery disease)    s/p CABG [place]  . Depression   . Dilated aortic root (HCC)    71mm aortic root and 60mm ascending aorta by echo 10/2017  . History of blood transfusion 1998   "due to cardiac bypass surgery"  . Hyperlipidemia   . Hypertension   . Hypogonadism in male   . Migraine   . Retinal hemorrhage 11/04/2014    Past Surgical History:  Procedure Laterality Date  . CORONARY ARTERY BYPASS GRAFT  1998  . TEE WITHOUT CARDIOVERSION N/A 01/19/2015   Procedure: TRANSESOPHAGEAL ECHOCARDIOGRAM (TEE);  Surgeon: Sueanne Margarita, MD;  Location:  [facility];  Service: Cardiovascular;  Laterality: N/A;  . VARICOSE VEIN SURGERY  1984     Medications Prior to Admission: Prior to Admission medications   Medication Sig Start Date End Date Taking? Authorizing Provider  ALPRAZolam (XANAX XR) 1 MG 24 hr tablet Take 1 tablet (1 mg total) by mouth daily. [name] taking differently: Take 1 mg by mouth at bedtime.  04/20/18  Yes Millikan, Jinny Blossom, NP  amLODipine (NORVASC) 10 MG tablet TAKE 1 TABLET BY MOUTH DAILY. Please make yearly appt with Dr. Radford Pax for February for future refills. 1st attempt [name] taking  differently: Take 10 mg by mouth daily.  06/25/18  Yes Turner, Eber Hong, MD  Ascorbic Acid (VITAMIN C) 1000 MG tablet Take 1,000 mg by mouth 2 (two) times daily.   Yes [provider]  aspirin EC 81 MG tablet Take 81 mg by mouth daily.   Yes [provider]  aspirin-acetaminophen-caffeine (EXCEDRIN EXTRA STRENGTH) [phone] MG tablet Take 2 tablets by mouth every 6 (six) hours as needed for headache.   Yes [provider]  butalbital-aspirin-caffeine-codeine ([place]) 50-325-40-30 MG capsule TAKE ONE CAPSULE BY MOUTH EVERY 4 HOURS AS NEEDED FOR PAIN [name] taking differently: Take 2 capsules by mouth every 4 (four) hours as needed for migraine.  08/10/18  Yes [name], NP  diazepam (VALIUM) 5 MG tablet Take 2.5-5 mg by mouth daily as needed for anxiety. 10/05/14  Yes [provider]  ibuprofen (ADVIL,MOTRIN) 200 MG tablet Take 200-400 mg by mouth daily as needed (for arthritis in the feet).   Yes [provider]  losartan (COZAAR) 25 MG tablet Take 1 tablet (25 mg total) by mouth daily. 10/14/17 08/21/18 Yes Turner, Eber Hong, MD  metoprolol succinate (TOPROL-XL) 100 MG 24 hr tablet TAKE 1 TABLET BY MOUTH DAILY. Please make yearly appt with Dr. Radford Pax for February for future refills. 1st attempt [name] taking differently: Take 100 mg by mouth at bedtime.  06/25/18  Yes Turner, Eber Hong, MD  niacin 500 MG tablet Take 500 mg by mouth at bedtime.   Yes [provider]  pravastatin (PRAVACHOL) 40 MG tablet Take 1 tablet (40 mg total) by mouth every evening. Please make yearly appt with Dr. Radford Pax for February for future refills. 1st attempt [name] taking differently: Take 40 mg by mouth every evening.  06/25/18  Yes Sueanne Margarita, MD  Specialty Vitamins Products (CENTRUM SPECIALIST ENERGY) TABS Take 1 tablet by mouth daily with breakfast.   Yes [provider]  tadalafil (CIALIS) 5 MG tablet Take 5 mg by mouth daily.    Yes  [provider]  Testosterone (ANDROGEL) 40.5 MG/2.5GM (1.62%) GEL Place 2 application onto the skin daily. BOTH SHOULDERS    Yes [provider]  TURMERIC PO Take 1 capsule by mouth daily with breakfast.    Yes [provider]  vitamin E 400 UNIT capsule Take 400 Units by mouth at bedtime.    Yes [provider]  zonisamide (ZONEGRAN) 25 MG capsule Take 2 capsules (50 mg total) by mouth daily. 06/24/18  Yes Dohmeier, Asencion Partridge, MD     Allergies:    Allergies  Allergen Reactions  . Mushroom Extract Complex Nausea Only and Other (See Comments)    Severe vertigo and headaches    Social History:   Social History   Socioeconomic History  . Marital status: Divorced    Spouse name: Not on file  . Number of children: Not on file  .  Years of education: Not on file  . Highest education level: Not on file  Occupational History  . Not on file  Social Needs  . Financial resource strain: Not on file  . Food insecurity:    Worry: Not on file    Inability: Not on file  . Transportation needs:    Medical: Not on file    Non-medical: Not on file  Tobacco Use  . Smoking status: Former Smoker    Years: 20.00    Last attempt to quit: 07/22/1986    Years since quitting: 32.1  . Smokeless tobacco: Never Used  Substance and Sexual Activity  . Alcohol use: Yes    Comment: 3-4 weekly  . Drug use: No  . Sexual activity: Not on file  Lifestyle  . Physical activity:    Days per week: Not on file    Minutes per session: Not on file  . Stress: Not on file  Relationships  . Social connections:    Talks on phone: Not on file    Gets together: Not on file    Attends religious service: Not on file    Active member of club or organization: Not on file    Attends meetings of clubs or organizations: Not on file    Relationship status: Not on file  . Intimate partner violence:    Fear of current or ex partner: Not on file    Emotionally abused: Not on file     Physically abused: Not on file    Forced sexual activity: Not on file  Other Topics Concern  . Not on file  Social History Narrative   Divorced   Secondary school teacher- retired   No children   Has a Neurosurgeon named [name]   Enjoys outdoor activities- hiking, swimming, Control and instrumentation engineer, baseball, writing, reading, cooking    Family History:   The [name]'s family history includes Arthritis in his father and mother; Heart disease (age of onset: 15) in his father; Hyperlipidemia in his father and mother; Hypertension in his father; Kidney disease in his paternal grandfather; Stroke (age of onset: 35) in his father.    ROS:  Please see the history of present illness.  Review of Systems  Constitutional: Negative for chills, diaphoresis and fever.  HENT: Negative for congestion and sore throat.   Eyes: Negative for blurred vision and double vision.  Respiratory: Negative for cough, hemoptysis and shortness of breath.   Cardiovascular: Positive for chest pain and leg swelling. Negative for palpitations, orthopnea and PND.  Gastrointestinal: Positive for blood in stool. Negative for abdominal pain, nausea and vomiting.  Genitourinary: Negative for hematuria.  Musculoskeletal: Negative for myalgias.  Neurological: Negative for dizziness, tingling and weakness.  Psychiatric/Behavioral: Positive for substance abuse (marijuana use).      Physical Exam/Data:   Vitals:   08/21/18 1815 08/21/18 1827 08/21/18 1900 08/21/18 1930  BP: (!) 145/69 (!) 145/69 (!) 128/91 114/64  Pulse: 64 73 67 (!) 58  Resp: 17 18 16 14   SpO2: 100% 100% 98% 95%  Weight:      Height:       No intake or output data in the 24 hours ending 08/21/18 1958 Last 3 Weights 08/21/2018 04/20/2018 10/14/2017  Weight (lbs) 160 lb 157 lb 6.4 oz 160 lb  Weight (kg) 72.576 kg 71.396 kg 72.576 kg     Body mass index is 25.06 kg/m.  General:  Well nourished, well developed, in no acute distress. HEENT: Normocephalic and atraumatic. Sclera clear.  EOMs intact. PERRLA. Lymph: No adenopathy. Neck: Supple. Mild JVD. Endocrine:  No thyromegaly. Vascular: No carotid bruits. Radial pulses 2+ and equal bilaterally. Cardiac:  RRR. Distinct S1 and S2. II-III/VI systolic murmur best heard at upper sternal border. No gallops or rubs appreciated. Lungs:  Clear to auscultation bilaterally. No wheezing, rhonchi, or rales. Abd: Soft, non-distended, and non-tender. Ext: 1+ pitting edema of bilateral lower extremities.  Musculoskeletal:  No deformities, BUE and BLE strength normal and equal. Skin: Warm and dry  Neuro:  Alert and oriented x3. No focal abnormalities noted. Psych:  Normal affect. Responds appropriately.   EKG:  The ECG that was done  was personally reviewed and demonstrates sinus rhythm with no acute ischemic changes from prior tracings.  Relevant CV Studies:  Echocardiogram 11/06/2017: Study Conclusions: - Left ventricle: The cavity size was normal. Wall thickness was   increased in a pattern of mild LVH. Systolic function was normal.   The estimated ejection fraction was in the range of 60% to 65%.   Wall motion was normal; there were no regional wall motion   abnormalities. Features are consistent with a pseudonormal left   ventricular filling pattern, with concomitant abnormal relaxation   and increased filling pressure (grade 2 diastolic dysfunction). - Aortic valve: Poorly visualized. Mildly calcified leaflets. There   was no stenosis. There was mild to moderate regurgitation. - Aorta: Severely dilated aortic root. Aortic root dimension: 51 mm   (ED). Ascending aortic diameter: 48 mm (S). - Mitral valve: Mildly calcified annulus. Mildly calcified leaflets   . There was trivial regurgitation. - Left atrium: The atrium was mildly dilated. - Right ventricle: The cavity size was normal. Systolic function   was normal. - Tricuspid valve: Peak RV-RA gradient (S): 27 mm Hg. - Pulmonary arteries: PA peak pressure: 30 mm Hg  (S). - Inferior vena cava: The vessel was normal in size. The   respirophasic diameter changes were in the normal range (>= 50%),   consistent with normal central venous pressure.  Impressions: - Normal LV size with mild LV hypertrophy. Normal LV size with EF   60-65%. Moderate diastolic dysfunction. Normal RV size and   systolic function. Per report, aortic valve is bicuspid but it   was not visualized well enough to confirm. Mild to moderate   aortic insufficiency. The aortic arch was severely dilated at 5.1   cm.  Laboratory Data:  Chemistry Recent Labs  Lab 08/21/18 1837  NA 143  K 3.4*  CL 109  CO2 23  GLUCOSE 113*  BUN 14  CREATININE 1.41*  CALCIUM 8.8*  GFRNONAA 50*  GFRAA 58*  ANIONGAP 11    Recent Labs  Lab 08/21/18 1837  PROT 6.6  ALBUMIN 3.8  AST 59*  ALT 34  ALKPHOS 102  BILITOT 0.6   Hematology Recent Labs  Lab 08/21/18 1837  WBC 10.3  RBC 3.53*  HGB 12.3*  HCT 36.4*  MCV 103.1*  MCH 34.8*  MCHC 33.8  RDW 12.4  PLT 269   Cardiac EnzymesNo results for input(s): TROPONINI in the last 168 hours.  Recent Labs  Lab 08/21/18 1842  TROPIPOC 3.31*    BNPNo results for input(s): BNP, PROBNP in the last 168 hours.  DDimer No results for input(s): DDIMER in the last 168 hours.  Radiology/Studies:  Dg Chest 2 View  Result Date: 08/21/2018 CLINICAL DATA:  Chest pain starting last night. Elevated troponin. EXAM: CHEST - 2 VIEW COMPARISON:  None. FINDINGS: Postoperative changes in the mediastinum.  Borderline heart size and pulmonary vascularity. No airspace disease or consolidation in the lungs. Perihilar streaky opacities likely representing chronic bronchitis or reactive airways disease. No blunting of costophrenic angles. No pneumothorax. Calcification of the aorta. IMPRESSION: Chronic bronchitic changes in the lungs. No evidence of active pulmonary disease. Electronically Signed   By: Lucienne Capers M.D.   On: 08/21/2018 19:12    Assessment  and Plan:   NSTEMI - [name] had 3 hour episode of chest pain yesterday that resolved without treatment. He saw PCP today for evaluation who checked a troponin. Troponin came back positive and [name] was advised to go to the ED for further evaluation. - EKG showed no acute ischemic changes. - I-stat troponin 3.31. Will continue to trend.  - Will check lipid panel and hemoglobin A1c. - Will check Echo.  - [name] currently reports only very minimal chest tightness. - Continue IV heparin drip. - Continue aspirin, beta-blocker, and statin. - Will plan for cardiac catheterization on Monday.   Diastolic Dysfunction - Last echo in 10/2017 showed LVEF of 60-65% with grade 2 diastolic dysfunction. - [name] denies any dyspnea on exertion, orthopnea, or PND. However, JVD mildly elevated and mild edema on exam - Will check Echo as above. - Will give one dose of IV Lasix 40mg  tonight and reassess volume status tomorrow.   Hypertension - Most recent BP 114/64. - Will continue home medications.   Hyperlipidemia - LDL 55 in 11/2017. - [name] currently on Pravastatin 40mg  at home. Consider starting Lipitor 40mg  in favor of high-intensity statin.  Ascending Aortic Aneurysm - Followed by Dr. Clementeen Graham at [facility]. Measured 61mm in diameter on annual CT scann in 05/2018. Aortic arch dilated at 5.1cm on Echo in 10/2017.   Bicuspid Aortic Valve with Mild to Moderate AR - Mild to moderate AR noted on Echo in 10/2017.  - Will check Echo as above.  Anemia - Hemoglobin 12.3 on admission. - [name] reportedly had a positive hemoccult a couple of weeks ago and is scheduled to see GI next week. No hematochezia or hematuria.   CKD Stage III - Creatinine 1.41 on admission. Appears to be at [name]'s baseline.  - Continue to monitor.     Severity of Illness: The appropriate [name] status for this [name] is INPATIENT. Inpatient status is judged to be reasonable and necessary in order to provide the required  intensity of service to ensure the [name]'s safety. The [name]'s presenting symptoms, physical exam findings, and initial radiographic and laboratory data in the context of their chronic comorbidities is felt to place them at high risk for further clinical deterioration. Furthermore, it is not anticipated that the [name] will be medically stable for discharge from the hospital within 2 midnights of admission. The following factors support the [name] status of inpatient.   " The [name]'s presenting symptoms include chest pain. " The worrisome physical exam findings include elevated JVD. " The initial radiographic and laboratory data are worrisome because of elevated troponin. " The chronic co-morbidities include CAD, hypertension, hyperlipidemia.   * I certify that at the point of admission it is my clinical judgment that the [name] will require inpatient hospital care spanning beyond 2 midnights from the point of admission due to high intensity of service, high risk for further deterioration and high frequency of surveillance required.*    For questions or updates, please contact [place] Please consult [email or web address] for contact info under        Signed, Darreld Mclean, PA-C  08/21/2018  7:58 PM   Attending note  [name] seen and discussed with PA Sarajane Jews, I agree with her documentation above. 73 yo male history of CAD with CABG in 1998 in [place], bicuspid AV with mod to severe AI and dilated aortic root (aortic aneurysm followed at [facility]), hyperlipidemia presents with chest pain   Apparently troponin at his pcp's office as elevated   Mg 2 K 3.4 Cr 1.41 BUN 14 WBC 10.3 Hgb 12.3 Plt 269  Trop 3.3--> CXR no acute process EKG SR, chroinc lateral Qwaves, mild lateral precordial ST depression 10/2017 echo LVEF 60-65%, no WMAs, grade II diastolic dysfunction, mild to mod AI, aortic root 5.1 cm 05/2018 [facility] chest [facility]: stable 4.7 cm ascending aortic  aneurysm  Given ASA 324mg  by EMS, started on hep gtt in ER.    Presents with NSTEMI. Medical therapy overnight with hep gtt, ASA 81, pravastatin 40 mg (home regimen) losartan 24, toprol 100. Plan for cath Monday.     Carlyle Dolly MD

## 2018-08-21 NOTE — ED Notes (Signed)
Ems gave 324mg   Of aspirin

## 2018-08-21 NOTE — ED Notes (Signed)
Report given to rn on 6e 

## 2018-08-21 NOTE — ED Notes (Signed)
To x-ray

## 2018-08-21 NOTE — ED Notes (Signed)
Informed Dr. Winfred Leeds of I-stat troponin 3.31

## 2018-08-21 NOTE — ED Notes (Signed)
An unseccessful attempt to call report to 73 e  Nurse will call back

## 2018-08-21 NOTE — ED Provider Notes (Addendum)
Complains Of left-sided anterior chest pain last night lasting from 6 PM to 9 PM, resolved without treatment.  He went to his PMD 'S office today and troponin was drawn which was noted to be elevated.  He was told to call 911 to get to the hospital.  He is presently asymptomatic.  He denies any shortness of breath or nausea.  On exam no distress lungs clear to auscultation heart regular rate and rhythm abdomen nondistended nontender   Orlie Dakin, MD 08/21/18 Frances Furbish, MD 09/03/18 1634 CRITICAL CARE Performed by: Orlie Dakin Total critical care time: 30 minutes Critical care time was exclusive of separately billable procedures and treating other patients. Critical care was necessary to treat or prevent imminent or life-threatening deterioration. Critical care was time spent personally by me on the following activities: development of treatment plan with patient and/or surrogate as well as nursing, discussions with consultants, evaluation of patient's response to treatment, examination of patient, obtaining history from patient or surrogate, ordering and performing treatments and interventions, ordering and review of laboratory studies, ordering and review of radiographic studies, pulse oximetry and re-evaluation of patient's condition.   Orlie Dakin, MD 09/03/18 [phone]

## 2018-08-21 NOTE — Progress Notes (Addendum)
ANTICOAGULATION CONSULT NOTE - Initial Consult  Pharmacy Consult for heparin Indication: chest pain/ACS  Allergies  Allergen Reactions  . Mushroom Extract Complex     Severe vertigo, nausea, headache  . Propoxyphene Nausea And Vomiting    Headache, vertigo    Patient Measurements: Height: 5\' 7"  (170.2 cm) Weight: 160 lb (72.6 kg) IBW/kg (Calculated) : 66.1 Heparin Dosing Weight: 72.6 kg  Vital Signs: BP: 145/69 (01/31 1827) Pulse Rate: 73 (01/31 1827)  Labs: No results for input(s): HGB, HCT, PLT, APTT, LABPROT, INR, HEPARINUNFRC, HEPRLOWMOCWT, CREATININE, CKTOTAL, CKMB, TROPONINI in the last 72 hours.  CrCl cannot be calculated (Patient's most recent lab result is older than the maximum 21 days allowed.).   Medical History: Past Medical History:  Diagnosis Date  . Arthritis   . Bicuspid aortic valve    with mild to moderate AR by echo 10/2017  . BPH (benign prostatic hyperplasia)   . CAD (coronary artery disease)    s/p CABG [place]  . Depression   . Dilated aortic root (HCC)    14mm aortic root and 28mm ascending aorta by echo 10/2017  . History of blood transfusion 1998   "due to cardiac bypass surgery"  . Hyperlipidemia   . Hypertension   . Hypogonadism in male   . Migraine   . Retinal hemorrhage 11/04/2014    Medications:  Scheduled:  . aspirin  81 mg Oral Once  . heparin  4,000 Units Intravenous Once  . sodium chloride flush  3 mL Intravenous Once    Assessment: 66 yom found to have elevated troponin at PCP office this morning s/p chest pain last evening. No PTA anticoag. Has known CAD hx with CABG in 1998.   No s/sx of bleeding. Hgb 12.3, plt 269. Troponin 3.31.   Goal of Therapy:  Heparin level 0.3-0.7 units/ml Monitor platelets by anticoagulation protocol: Yes   Plan:  Give 4000 units bolus x 1 Start heparin infusion at 850 units/hr Check anti-Xa level in 8 hours and daily while on heparin Continue to monitor H&H and  platelets  Antonietta Jewel, PharmD, [place] Clinical Pharmacist  Pager: [phone] Phone: [phone] 08/21/2018,6:31 PM

## 2018-08-22 ENCOUNTER — Inpatient Hospital Stay (HOSPITAL_COMMUNITY): Payer: Medicare Other

## 2018-08-22 DIAGNOSIS — I214 Non-ST elevation (NSTEMI) myocardial infarction: Secondary | ICD-10-CM

## 2018-08-22 LAB — HEMOGLOBIN A1C
Hgb A1c MFr Bld: 5.9 % — ABNORMAL HIGH (ref 4.8–5.6)
Mean Plasma Glucose: 122.63 mg/dL

## 2018-08-22 LAB — CBC
HCT: 35.8 % — ABNORMAL LOW (ref 39.0–52.0)
Hemoglobin: 12 g/dL — ABNORMAL LOW (ref 13.0–17.0)
MCH: 34.1 pg — ABNORMAL HIGH (ref 26.0–34.0)
MCHC: 33.5 g/dL (ref 30.0–36.0)
MCV: 101.7 fL — AB (ref 80.0–100.0)
Platelets: 264 10*3/uL (ref 150–400)
RBC: 3.52 MIL/uL — ABNORMAL LOW (ref 4.22–5.81)
RDW: 12.2 % (ref 11.5–15.5)
WBC: 9.5 10*3/uL (ref 4.0–10.5)
nRBC: 0 % (ref 0.0–0.2)

## 2018-08-22 LAB — BASIC METABOLIC PANEL
Anion gap: 11 (ref 5–15)
BUN: 14 mg/dL (ref 8–23)
CO2: 25 mmol/L (ref 22–32)
Calcium: 8.5 mg/dL — ABNORMAL LOW (ref 8.9–10.3)
Chloride: 109 mmol/L (ref 98–111)
Creatinine, Ser: 1.4 mg/dL — ABNORMAL HIGH (ref 0.61–1.24)
GFR calc Af Amer: 59 mL/min — ABNORMAL LOW (ref >60)
GFR calc non Af Amer: 51 mL/min — ABNORMAL LOW (ref >60)
Glucose, Bld: 105 mg/dL — ABNORMAL HIGH (ref 70–99)
Potassium: 3.7 mmol/L (ref 3.5–5.1)
Sodium: 145 mmol/L (ref 135–145)

## 2018-08-22 LAB — HEPARIN LEVEL (UNFRACTIONATED)
HEPARIN UNFRACTIONATED: 0.21 [IU]/mL — AB (ref 0.30–0.70)
Heparin Unfractionated: 0.12 IU/mL — ABNORMAL LOW (ref 0.30–0.70)
Heparin Unfractionated: 0.23 IU/mL — ABNORMAL LOW (ref 0.30–0.70)

## 2018-08-22 LAB — ECHOCARDIOGRAM COMPLETE
HEIGHTINCHES: 67 in
Weight: 2518.4 oz

## 2018-08-22 LAB — LIPID PANEL
Cholesterol: 87 mg/dL (ref 0–200)
HDL: 31 mg/dL — ABNORMAL LOW (ref >40)
LDL Cholesterol: 44 mg/dL (ref 0–99)
TRIGLYCERIDES: 58 mg/dL (ref <150)
Total CHOL/HDL Ratio: 2.8 RATIO
VLDL: 12 mg/dL (ref 0–40)

## 2018-08-22 LAB — TROPONIN I
Troponin I: 2.7 ng/mL (ref <0.03)
Troponin I: 2.35 ng/mL (ref <0.03)

## 2018-08-22 LAB — HIV ANTIBODY (ROUTINE TESTING W REFLEX): HIV Screen 4th Generation wRfx: NONREACTIVE

## 2018-08-22 MED ORDER — ASPIRIN 81 MG PO CHEW
81.0000 mg | CHEWABLE_TABLET | Freq: Once | ORAL | Status: AC
  Administered 2018-08-24: 81 mg via ORAL
  Filled 2018-08-22: qty 1

## 2018-08-22 MED ORDER — HEPARIN BOLUS VIA INFUSION
2000.0000 [IU] | Freq: Once | INTRAVENOUS | Status: AC
  Administered 2018-08-22: 2000 [IU] via INTRAVENOUS
  Filled 2018-08-22: qty 2000

## 2018-08-22 NOTE — Progress Notes (Signed)
[facility] for Heparin Indication: chest pain/ACS  Allergies  Allergen Reactions  . Mushroom Extract Complex Nausea Only and Other (See Comments)    Severe vertigo and headaches    Patient Measurements: Height: 5\' 7"  (170.2 cm) Weight: 160 lb (72.6 kg) IBW/kg (Calculated) : 66.1 Heparin Dosing Weight: 72.6 kg  Vital Signs: Temp: 98.9 F (37.2 C) (01/31 2332) Temp Source: Oral (01/31 2332) BP: 130/70 (01/31 2332) Pulse Rate: 70 (01/31 2332)  Labs: Recent Labs    08/21/18 1837 08/22/18 0033 08/22/18 0238  HGB 12.3*  --   --   HCT 36.4*  --   --   PLT 269  --   --   LABPROT 13.7  --   --   INR 1.05  --   --   HEPARINUNFRC  --   --  0.12*  CREATININE 1.41*  --   --   TROPONINI  --  2.70*  --     Estimated Creatinine Clearance: 45.6 mL/min (A) (by C-G formula based on SCr of 1.41 mg/dL (H)).   Medical History: Past Medical History:  Diagnosis Date  . Arthritis   . Bicuspid aortic valve    with mild to moderate AR by echo 10/2017  . BPH (benign prostatic hyperplasia)   . CAD (coronary artery disease)    s/p CABG [place]  . Depression   . Dilated aortic root (HCC)    30mm aortic root and 54mm ascending aorta by echo 10/2017  . History of blood transfusion 1998   "due to cardiac bypass surgery"  . Hyperlipidemia   . Hypertension   . Hypogonadism in male   . Migraine   . Retinal hemorrhage 11/04/2014    Medications:  Scheduled:  . ALPRAZolam  1 mg Oral QHS  . amLODipine  10 mg Oral Daily  . aspirin  81 mg Oral Once  . aspirin EC  81 mg Oral Daily  . losartan  25 mg Oral Daily  . metoprolol succinate  100 mg Oral QHS  . niacin  500 mg Oral QHS  . pravastatin  40 mg Oral QPM  . sodium chloride flush  3 mL Intravenous Once  . sodium chloride flush  3 mL Intravenous Q12H  . zonisamide  50 mg Oral Daily    Assessment: 38 yom found to have elevated troponin at PCP office this morning s/p chest pain last  evening. No PTA anticoag. Has known CAD hx with CABG in 1998.   No s/sx of bleeding. Hgb 12.3, plt 269. Troponin 3.31.   2/1 Update: heparin level low, no issues per RN.   Goal of Therapy:  Heparin level 0.3-0.7 units/ml Monitor platelets by anticoagulation protocol: Yes   Plan:  Heparin 2000 units [employer] heparin drip to 1000 units/hr 1200 HL  Narda Bonds, PharmD, BCPS Clinical Pharmacist Phone: [phone]

## 2018-08-22 NOTE — Progress Notes (Signed)
Progress Note  Patient Name: [name] Date of Encounter: 08/22/2018  Primary Cardiologist: Fransico Him, MD   Subjective   Currently teary as he is quite anxious about his hospitalization.  He is also anxious that he has moved to [place] 3 years ago and has not developed a social life and is somewhat depressed about not having anyone in the hospital with him.  He is having some mild chest discomfort in his left chest but much improved from yesterday.  Inpatient Medications    Scheduled Meds: . ALPRAZolam  1 mg Oral QHS  . amLODipine  10 mg Oral Daily  . aspirin  81 mg Oral Once  . aspirin EC  81 mg Oral Daily  . losartan  25 mg Oral Daily  . metoprolol succinate  100 mg Oral QHS  . niacin  500 mg Oral QHS  . pravastatin  40 mg Oral QPM  . sodium chloride flush  3 mL Intravenous Once  . sodium chloride flush  3 mL Intravenous Q12H  . zonisamide  50 mg Oral Daily   Continuous Infusions: . sodium chloride    . [START ON 08/24/2018] sodium chloride     Followed by  . [START ON 08/24/2018] sodium chloride    . heparin 1,000 Units/hr (08/22/18 0418)   PRN Meds: sodium chloride, acetaminophen, diazepam, nitroGLYCERIN, ondansetron (ZOFRAN) IV, sodium chloride flush   Vital Signs    Vitals:   08/21/18 2332 08/21/18 2339 08/22/18 0526 08/22/18 0849  BP: 130/70  126/63 117/71  Pulse: 70  (!) 56   Resp: 16  17   Temp: 98.9 F (37.2 C)  98.4 F (36.9 C)   TempSrc: Oral  Oral   SpO2: 97% 97% 99%   Weight:   71.4 kg   Height:   5\' 7"  (1.702 m)     Intake/Output Summary (Last 24 hours) at 08/22/2018 1104 Last data filed at 08/22/2018 0845 Gross per 24 hour  Intake 240 ml  Output 2850 ml  Net -2610 ml   Last 3 Weights 08/22/2018 08/21/2018 04/20/2018  Weight (lbs) 157 lb 6.4 oz 160 lb 157 lb 6.4 oz  Weight (kg) 71.396 kg 72.576 kg 71.396 kg      Telemetry    Sinus rhythm, 4 beat run of nonsustained VT, short run of SVT likely atrial tachycardia- Personally  Reviewed  ECG    Sinus rhythm- Personally Reviewed  Physical Exam   GEN: No acute distress.   Neck: No JVD Cardiac: RRR, no murmurs, rubs, or gallops.  Respiratory: Clear to auscultation bilaterally. GI: Soft, nontender, non-distended  MS: No edema; No deformity. Neuro:  Nonfocal  Psych: Normal affect   Labs    Chemistry Recent Labs  Lab 08/21/18 1837 08/22/18 0645  NA 143 145  K 3.4* 3.7  CL 109 109  CO2 23 25  GLUCOSE 113* 105*  BUN 14 14  CREATININE 1.41* 1.40*  CALCIUM 8.8* 8.5*  PROT 6.6  --   ALBUMIN 3.8  --   AST 59*  --   ALT 34  --   ALKPHOS 102  --   BILITOT 0.6  --   GFRNONAA 50* 51*  GFRAA 58* 59*  ANIONGAP 11 11     Hematology Recent Labs  Lab 08/21/18 1837 08/22/18 0645  WBC 10.3 9.5  RBC 3.53* 3.52*  HGB 12.3* 12.0*  HCT 36.4* 35.8*  MCV 103.1* 101.7*  MCH 34.8* 34.1*  MCHC 33.8 33.5  RDW 12.4 12.2  PLT 269 264    Cardiac Enzymes Recent Labs  Lab 08/22/18 0033 08/22/18 0645  TROPONINI 2.70* 2.35*    Recent Labs  Lab 08/21/18 1842 08/21/18 2146  TROPIPOC 3.31* 2.68*     BNPNo results for input(s): BNP, PROBNP in the last 168 hours.   DDimer No results for input(s): DDIMER in the last 168 hours.   Radiology    Dg Chest 2 View  Result Date: 08/21/2018 CLINICAL DATA:  Chest pain starting last night. Elevated troponin. EXAM: CHEST - 2 VIEW COMPARISON:  None. FINDINGS: Postoperative changes in the mediastinum. Borderline heart size and pulmonary vascularity. No airspace disease or consolidation in the lungs. Perihilar streaky opacities likely representing chronic bronchitis or reactive airways disease. No blunting of costophrenic angles. No pneumothorax. Calcification of the aorta. IMPRESSION: Chronic bronchitic changes in the lungs. No evidence of active pulmonary disease. Electronically Signed   By: Lucienne Capers M.D.   On: 08/21/2018 19:12    Cardiac Studies   TTE 11/06/2017 - Left ventricle: The cavity size was  normal. Wall thickness was   increased in a pattern of mild LVH. Systolic function was normal.   The estimated ejection fraction was in the range of 60% to 65%.   Wall motion was normal; there were no regional wall motion   abnormalities. Features are consistent with a pseudonormal left   ventricular filling pattern, with concomitant abnormal relaxation   and increased filling pressure (grade 2 diastolic dysfunction). - Aortic valve: Poorly visualized. Mildly calcified leaflets. There   was no stenosis. There was mild to moderate regurgitation. - Aorta: Severely dilated aortic root. Aortic root dimension: 51 mm   (ED). Ascending aortic diameter: 48 mm (S). - Mitral valve: Mildly calcified annulus. Mildly calcified leaflets   . There was trivial regurgitation. - Left atrium: The atrium was mildly dilated. - Right ventricle: The cavity size was normal. Systolic function   was normal. - Tricuspid valve: Peak RV-RA gradient (S): 27 mm Hg. - Pulmonary arteries: PA peak pressure: 30 mm Hg (S). - Inferior vena cava: The vessel was normal in size. The   respirophasic diameter changes were in the normal range (>= 50%),   consistent with normal central venous pressure.  Patient Profile     73 y.o. male history of coronary artery disease status post CABG in 1998 status post bicuspid AVR with moderate to severe AI and dilated aorta who presented to the hospital with chest pain found to have a non-STEMI  Assessment & Plan    1.  Non-STEMI: Patient is currently pain-free, though he does have some twinges occasionally.  We  give nitroglycerin for his chest pain twinges.  His troponins fortunately have come down.  We  continue his IV heparin until catheterization planned for Monday.  We  continue aspirin, beta-blocker, statin.  2.  Diastolic dysfunction: Noted to be grade 2 on most recent echo.  No chest pain or shortness of breath.  No signs of volume overload.  3.  Hypertension: Blood  pressure is well controlled  4.  Hyperlipidemia: LDL at goal.  Continue statin.  5.  Bicuspid aortic valve with mild to moderate AR: We  recheck echocardiogram.  6.  Stage III CKD: Creatinine appears to be at baseline.  Continue to monitor.  For questions or updates, please contact [place] Please consult [email or web address] for contact info under        Signed,  Meredith Leeds, MD  08/22/2018, 11:04 AM

## 2018-08-22 NOTE — Progress Notes (Addendum)
[place] for Heparin Indication: chest pain/ACS  Allergies  Allergen Reactions  . Mushroom Extract Complex Nausea Only and Other (See Comments)    Severe vertigo and headaches    Patient Measurements: Height: 5\' 7"  (170.2 cm) Weight: 157 lb 6.4 oz (71.4 kg) IBW/kg (Calculated) : 66.1 Heparin Dosing Weight: 72.6 kg  Vital Signs: Temp: 98.4 F (36.9 C) (02/01 0526) Temp Source: Oral (02/01 0526) BP: 118/67 (02/01 1114) Pulse Rate: 56 (02/01 0526)  Labs: Recent Labs    08/21/18 1837 08/22/18 0033 08/22/18 0238 08/22/18 0645 08/22/18 1132  HGB 12.3*  --   --  12.0*  --   HCT 36.4*  --   --  35.8*  --   PLT 269  --   --  264  --   LABPROT 13.7  --   --   --   --   INR 1.05  --   --   --   --   HEPARINUNFRC  --   --  0.12*  --  0.23*  CREATININE 1.41*  --   --  1.40*  --   TROPONINI  --  2.70*  --  2.35*  --     Estimated Creatinine Clearance: 45.9 mL/min (A) (by C-G formula based on SCr of 1.4 mg/dL (H)).   Medical History: Past Medical History:  Diagnosis Date  . Arthritis   . Bicuspid aortic valve    with mild to moderate AR by echo 10/2017  . BPH (benign prostatic hyperplasia)   . CAD (coronary artery disease)    s/p CABG [facility] [place]  . Depression   . Dilated aortic root (HCC)    69mm aortic root and 11mm ascending aorta by echo 10/2017  . History of blood transfusion 1998   "due to cardiac bypass surgery"  . Hyperlipidemia   . Hypertension   . Hypogonadism in male   . Migraine   . Retinal hemorrhage 11/04/2014    Medications:  Scheduled:  . ALPRAZolam  1 mg Oral QHS  . amLODipine  10 mg Oral Daily  . [START ON 08/24/2018] aspirin  81 mg Oral Once  . aspirin EC  81 mg Oral Daily  . losartan  25 mg Oral Daily  . metoprolol succinate  100 mg Oral QHS  . niacin  500 mg Oral QHS  . pravastatin  40 mg Oral QPM  . sodium chloride flush  3 mL Intravenous Once  . sodium chloride flush  3 mL Intravenous Q12H   . zonisamide  50 mg Oral Daily    Assessment: 9 yom found to have elevated troponin at PCP office this morning s/p chest pain last evening. No PTA anticoag. Has known CAD hx with CABG in 1998.   Heparin level came back subtherapeutic at 0.23, on 1000 units/hr. No s/sx of bleeding. Hgb 12, plt 264. Troponin 3.31>2.35.  Goal of Therapy:  Heparin level 0.3-0.7 units/ml Monitor platelets by anticoagulation protocol: Yes   Plan:  Increase heparin drip to 1150 units/hr Obtain heparin level in 6 hours Monitor daily HL, CBC, and for s/sx of bleeding   Antonietta Jewel, PharmD, B[facility]CP Clinical Pharmacist  Pager: [phone] Phone: [phone]  ADDENDUM Heparin level is slightly subtherapeutic at 0.21, on 1150 units/hr. Hgb 12, plt 264. No s/sx of bleeding. No infusion issues. Increase heparin infusion to 1350 units/hr and obtain heparin level with AM labs.  Antonietta Jewel, PharmD, [place] Clinical Pharmacist

## 2018-08-22 NOTE — Progress Notes (Signed)
  Echocardiogram 2D Echocardiogram has been performed.  [name] 08/22/2018, 4:12 PM

## 2018-08-22 NOTE — Progress Notes (Signed)
Pt set up with CPAP for the night with home setting of 10 as per patient.

## 2018-08-23 LAB — CBC
HCT: 35.2 % — ABNORMAL LOW (ref 39.0–52.0)
Hemoglobin: 11.7 g/dL — ABNORMAL LOW (ref 13.0–17.0)
MCH: 33.8 pg (ref 26.0–34.0)
MCHC: 33.2 g/dL (ref 30.0–36.0)
MCV: 101.7 fL — ABNORMAL HIGH (ref 80.0–100.0)
PLATELETS: 256 10*3/uL (ref 150–400)
RBC: 3.46 MIL/uL — ABNORMAL LOW (ref 4.22–5.81)
RDW: 12.1 % (ref 11.5–15.5)
WBC: 10.3 10*3/uL (ref 4.0–10.5)
nRBC: 0 % (ref 0.0–0.2)

## 2018-08-23 LAB — HEPARIN LEVEL (UNFRACTIONATED): Heparin Unfractionated: 0.5 IU/mL (ref 0.30–0.70)

## 2018-08-23 MED ORDER — SODIUM CHLORIDE 0.9% FLUSH
3.0000 mL | Freq: Two times a day (BID) | INTRAVENOUS | Status: DC
  Administered 2018-08-23 (×2): 3 mL via INTRAVENOUS

## 2018-08-23 MED ORDER — SODIUM CHLORIDE 0.9 % WEIGHT BASED INFUSION
3.0000 mL/kg/h | INTRAVENOUS | Status: DC
  Administered 2018-08-24: 3 mL/kg/h via INTRAVENOUS

## 2018-08-23 MED ORDER — SODIUM CHLORIDE 0.9 % IV SOLN: 250.0000 mL | INTRAVENOUS | Status: DC | PRN

## 2018-08-23 MED ORDER — SODIUM CHLORIDE 0.9% FLUSH: 3.0000 mL | INTRAVENOUS | Status: DC | PRN

## 2018-08-23 MED ORDER — SODIUM CHLORIDE 0.9 % WEIGHT BASED INFUSION
1.0000 mL/kg/h | INTRAVENOUS | Status: DC
  Administered 2018-08-24: 1 mL/kg/h via INTRAVENOUS

## 2018-08-23 NOTE — Progress Notes (Signed)
Pt wearing CPAP for the night and tolerating well.

## 2018-08-23 NOTE — Progress Notes (Signed)
[place] for Heparin Indication: chest pain/ACS  Allergies  Allergen Reactions  . Mushroom Extract Complex Nausea Only and Other (See Comments)    Severe vertigo and headaches    Patient Measurements: Height: 5\' 7"  (170.2 cm) Weight: 156 lb 8 oz (71 kg) IBW/kg (Calculated) : 66.1 Heparin Dosing Weight: 72.6 kg  Vital Signs: Temp: 98.5 F (36.9 C) (02/02 1201) Temp Source: Oral (02/02 1201) BP: 130/70 (02/02 1201) Pulse Rate: 63 (02/02 1201)  Labs: Recent Labs    08/21/18 1837 08/22/18 0033  08/22/18 0645 08/22/18 1132 08/22/18 1803 08/23/18 0417  HGB 12.3*  --   --  12.0*  --   --  11.7*  HCT 36.4*  --   --  35.8*  --   --  35.2*  PLT 269  --   --  264  --   --  256  LABPROT 13.7  --   --   --   --   --   --   INR 1.05  --   --   --   --   --   --   HEPARINUNFRC  --   --    < >  --  0.23* 0.21* 0.50  CREATININE 1.41*  --   --  1.40*  --   --   --   TROPONINI  --  2.70*  --  2.35*  --   --   --    < > = values in this interval not displayed.    Estimated Creatinine Clearance: 45.9 mL/min (A) (by C-G formula based on SCr of 1.4 mg/dL (H)).   Medical History: Past Medical History:  Diagnosis Date  . Arthritis   . Bicuspid aortic valve    with mild to moderate AR by echo 10/2017  . BPH (benign prostatic hyperplasia)   . CAD (coronary artery disease)    s/p CABG [facility] [place]  . Depression   . Dilated aortic root (HCC)    22mm aortic root and 90mm ascending aorta by echo 10/2017  . History of blood transfusion 1998   "due to cardiac bypass surgery"  . Hyperlipidemia   . Hypertension   . Hypogonadism in male   . Migraine   . Retinal hemorrhage 11/04/2014    Medications:  Scheduled:  . ALPRAZolam  1 mg Oral QHS  . amLODipine  10 mg Oral Daily  . [START ON 08/24/2018] aspirin  81 mg Oral Once  . aspirin EC  81 mg Oral Daily  . losartan  25 mg Oral Daily  . metoprolol succinate  100 mg Oral QHS  . niacin  500  mg Oral QHS  . pravastatin  40 mg Oral QPM  . sodium chloride flush  3 mL Intravenous Once  . sodium chloride flush  3 mL Intravenous Q12H  . sodium chloride flush  3 mL Intravenous Q12H  . zonisamide  50 mg Oral Daily    Assessment: 58 yom found to have elevated troponin at PCP office this morning s/p chest pain last evening. No PTA anticoag. Has known CAD hx with CABG in 1998.   Heparin level came back therapeutic this morning at 0.5, on 1350 units/hr. Hgb 11.7, plt 256. No s/sx of bleeding. No infusion issues. Troponin 3.31>2.35.  Goal of Therapy:  Heparin level 0.3-0.7 units/ml Monitor platelets by anticoagulation protocol: Yes   Plan:  Continue heparin drip at 1350 units/hr Monitor daily HL, CBC, and for s/sx  of bleeding  F/u after cardiac cath tomorrow  Antonietta Jewel, PharmD, [place] Clinical Pharmacist  Pager: [phone] Phone: [phone]

## 2018-08-23 NOTE — Progress Notes (Signed)
Progress Note  Patient Name: [name] Date of Encounter: 08/23/2018  Primary Cardiologist: Fransico Him, MD   Subjective   Feeling well.  No recurrent chest pain.  Inpatient Medications    Scheduled Meds: . ALPRAZolam  1 mg Oral QHS  . amLODipine  10 mg Oral Daily  . [START ON 08/24/2018] aspirin  81 mg Oral Once  . aspirin EC  81 mg Oral Daily  . losartan  25 mg Oral Daily  . metoprolol succinate  100 mg Oral QHS  . niacin  500 mg Oral QHS  . pravastatin  40 mg Oral QPM  . sodium chloride flush  3 mL Intravenous Once  . sodium chloride flush  3 mL Intravenous Q12H  . zonisamide  50 mg Oral Daily   Continuous Infusions: . sodium chloride    . [START ON 08/24/2018] sodium chloride     Followed by  . [START ON 08/24/2018] sodium chloride    . heparin 1,350 Units/hr (08/23/18 0600)   PRN Meds: sodium chloride, acetaminophen, diazepam, nitroGLYCERIN, ondansetron (ZOFRAN) IV, sodium chloride flush   Vital Signs    Vitals:   08/22/18 1106 08/22/18 1114 08/22/18 2023 08/23/18 0541  BP: 129/68 118/67 138/74 127/64  Pulse:   82 (!) 55  Resp:   (!) 22 19  Temp:   99 F (37.2 C) 98.4 F (36.9 C)  TempSrc:   Oral Oral  SpO2:   97% 98%  Weight:    71 kg  Height:        Intake/Output Summary (Last 24 hours) at 08/23/2018 0915 Last data filed at 08/23/2018 1194 Gross per 24 hour  Intake 1286.54 ml  Output 1135 ml  Net 151.54 ml   Last 3 Weights 08/23/2018 08/22/2018 08/21/2018  Weight (lbs) 156 lb 8 oz 157 lb 6.4 oz 160 lb  Weight (kg) 70.988 kg 71.396 kg 72.576 kg      Telemetry    Sinus rhythm- Personally Reviewed  ECG    None new- Personally Reviewed  Physical Exam   GEN: Well nourished, well developed, in no acute distress  HEENT: normal  Neck: no JVD, carotid bruits, or masses Cardiac: RRR; no murmurs, rubs, or gallops,no edema  Respiratory:  clear to auscultation bilaterally, normal work of breathing GI: soft, nontender, nondistended, + BS MS: no  deformity or atrophy  Skin: warm and dry Neuro:  Strength and sensation are intact Psych: euthymic mood, full affect   Labs    Chemistry Recent Labs  Lab 08/21/18 1837 08/22/18 0645  NA 143 145  K 3.4* 3.7  CL 109 109  CO2 23 25  GLUCOSE 113* 105*  BUN 14 14  CREATININE 1.41* 1.40*  CALCIUM 8.8* 8.5*  PROT 6.6  --   ALBUMIN 3.8  --   AST 59*  --   ALT 34  --   ALKPHOS 102  --   BILITOT 0.6  --   GFRNONAA 50* 51*  GFRAA 58* 59*  ANIONGAP 11 11     Hematology Recent Labs  Lab 08/21/18 1837 08/22/18 0645 08/23/18 0417  WBC 10.3 9.5 10.3  RBC 3.53* 3.52* 3.46*  HGB 12.3* 12.0* 11.7*  HCT 36.4* 35.8* 35.2*  MCV 103.1* 101.7* 101.7*  MCH 34.8* 34.1* 33.8  MCHC 33.8 33.5 33.2  RDW 12.4 12.2 12.1  PLT 269 264 256    Cardiac Enzymes Recent Labs  Lab 08/22/18 0033 08/22/18 0645  TROPONINI 2.70* 2.35*    Recent Labs  Lab  08/21/18 1842 08/21/18 2146  TROPIPOC 3.31* 2.68*     BNPNo results for input(s): BNP, PROBNP in the last 168 hours.   DDimer No results for input(s): DDIMER in the last 168 hours.   Radiology    Dg Chest 2 View  Result Date: 08/21/2018 CLINICAL DATA:  Chest pain starting last night. Elevated troponin. EXAM: CHEST - 2 VIEW COMPARISON:  None. FINDINGS: Postoperative changes in the mediastinum. Borderline heart size and pulmonary vascularity. No airspace disease or consolidation in the lungs. Perihilar streaky opacities likely representing chronic bronchitis or reactive airways disease. No blunting of costophrenic angles. No pneumothorax. Calcification of the aorta. IMPRESSION: Chronic bronchitic changes in the lungs. No evidence of active pulmonary disease. Electronically Signed   By: Lucienne Capers M.D.   On: 08/21/2018 19:12    Cardiac Studies   TTE 08/22/18  1. The left ventricle has normal systolic function of 12-45%. The cavity size is normal. There is mild concentric left ventricular hypertrophy. Echo evidence of normal diastolic  filling patterns. Normal left ventricular filling pressures.  2. Normal left atrial size.  3. Normal right atrial size.  4. The mitral valve normal in structure. There is mild mitral annular calcification present. Regurgitation is trivial by color flow Doppler . No evidence of mitral valve stenosis.  5. Normal tricuspid valve.  6. The aortic valve possibly bicuspid. Aortic valve regurgitation is mild to moderate by color flow Doppler. The jet is eccentric posteriorly directed.  7. Moderate dilatation of the aortic root.  8. No atrial level shunt detected by color flow Doppler.  9. Consider further evaluation of the ascending aorta with CT angiography.  Patient Profile     73 y.o. male history of coronary artery disease status post CABG in 1998 status post bicuspid AVR with moderate to severe AI and dilated aorta who presented to the hospital with chest pain found to have a non-STEMI  Assessment & Plan    1.  Non-STEMI: Chest pain-free.  Troponin has peaked.  We [facility] Sitzmann plan for left heart catheterization tomorrow.  All questions were answered today.    2.  Bicuspid aortic valve with mild to moderate AR: Co. shows stable aortic valve.  No changes.    3.  Hypertension: Well-controlled  4.  Hyperlipidemia: LDL at goal.  Continue statin  5.  Stage III CKD: Currently at baseline.  For questions or updates, please contact [place] Please consult [email or web address] for contact info under        Signed, Terez Freimark Meredith Leeds, MD  08/23/2018, 9:15 AM

## 2018-08-24 ENCOUNTER — Encounter (HOSPITAL_COMMUNITY)
Admission: EM | Disposition: A | Discharge status: Home / Self Care | Payer: Self-pay | Source: Home / Self Care | Attending: Cardiology

## 2018-08-24 DIAGNOSIS — I2510 Atherosclerotic heart disease of native coronary artery without angina pectoris: Secondary | ICD-10-CM

## 2018-08-24 DIAGNOSIS — Q231 Congenital insufficiency of aortic valve: Secondary | ICD-10-CM

## 2018-08-24 HISTORY — PX: LEFT HEART CATH AND CORONARY ANGIOGRAPHY: CATH118249

## 2018-08-24 LAB — BASIC METABOLIC PANEL
ANION GAP: 11 (ref 5–15)
Anion gap: 10 (ref 5–15)
Anion gap: 11 (ref 5–15)
BUN: 21 mg/dL (ref 8–23)
BUN: 20 mg/dL (ref 8–23)
BUN: 16 mg/dL (ref 8–23)
CO2: 24 mmol/L (ref 22–32)
CO2: 24 mmol/L (ref 22–32)
CO2: 24 mmol/L (ref 22–32)
CREATININE: 1.65 mg/dL — AB (ref 0.61–1.24)
Calcium: 8.2 mg/dL — ABNORMAL LOW (ref 8.9–10.3)
Calcium: 8.2 mg/dL — ABNORMAL LOW (ref 8.9–10.3)
Calcium: 8.3 mg/dL — ABNORMAL LOW (ref 8.9–10.3)
Chloride: 108 mmol/L (ref 98–111)
Chloride: 109 mmol/L (ref 98–111)
Chloride: 108 mmol/L (ref 98–111)
Creatinine, Ser: 1.65 mg/dL — ABNORMAL HIGH (ref 0.61–1.24)
Creatinine, Ser: 1.55 mg/dL — ABNORMAL HIGH (ref 0.61–1.24)
GFR calc Af Amer: 48 mL/min — ABNORMAL LOW (ref >60)
GFR calc Af Amer: 48 mL/min — ABNORMAL LOW (ref >60)
GFR calc Af Amer: 52 mL/min — ABNORMAL LOW (ref >60)
GFR calc non Af Amer: 41 mL/min — ABNORMAL LOW (ref >60)
GFR calc non Af Amer: 41 mL/min — ABNORMAL LOW (ref >60)
GFR calc non Af Amer: 45 mL/min — ABNORMAL LOW (ref >60)
GLUCOSE: 107 mg/dL — AB (ref 70–99)
Glucose, Bld: 115 mg/dL — ABNORMAL HIGH (ref 70–99)
Glucose, Bld: 116 mg/dL — ABNORMAL HIGH (ref 70–99)
Potassium: 3.5 mmol/L (ref 3.5–5.1)
Potassium: 3.6 mmol/L (ref 3.5–5.1)
Potassium: 3.6 mmol/L (ref 3.5–5.1)
Sodium: 143 mmol/L (ref 135–145)
Sodium: 143 mmol/L (ref 135–145)
Sodium: 143 mmol/L (ref 135–145)

## 2018-08-24 LAB — CBC
HCT: 38.4 % — ABNORMAL LOW (ref 39.0–52.0)
Hemoglobin: 12.7 g/dL — ABNORMAL LOW (ref 13.0–17.0)
MCH: 33.5 pg (ref 26.0–34.0)
MCHC: 33.1 g/dL (ref 30.0–36.0)
MCV: 101.3 fL — ABNORMAL HIGH (ref 80.0–100.0)
Platelets: 256 10*3/uL (ref 150–400)
RBC: 3.79 MIL/uL — ABNORMAL LOW (ref 4.22–5.81)
RDW: 12 % (ref 11.5–15.5)
WBC: 10.4 10*3/uL (ref 4.0–10.5)
nRBC: 0 % (ref 0.0–0.2)

## 2018-08-24 LAB — HEPARIN LEVEL (UNFRACTIONATED): Heparin Unfractionated: 0.58 IU/mL (ref 0.30–0.70)

## 2018-08-24 SURGERY — LEFT HEART CATH AND CORONARY ANGIOGRAPHY
Anesthesia: LOCAL

## 2018-08-24 MED ORDER — ATORVASTATIN CALCIUM 80 MG PO TABS
80.0000 mg | ORAL_TABLET | Freq: Every day | ORAL | Status: DC
  Administered 2018-08-24: 80 mg via ORAL
  Filled 2018-08-24: qty 1

## 2018-08-24 MED ORDER — HEPARIN (PORCINE) IN NACL 1000-0.9 UT/500ML-% IV SOLN
INTRAVENOUS | Status: DC | PRN
  Administered 2018-08-24 (×2): 500 mL

## 2018-08-24 MED ORDER — SODIUM CHLORIDE 0.9% FLUSH
3.0000 mL | Freq: Two times a day (BID) | INTRAVENOUS | Status: DC
  Administered 2018-08-24 – 2018-08-25 (×2): 3 mL via INTRAVENOUS

## 2018-08-24 MED ORDER — FENTANYL CITRATE (PF) 100 MCG/2ML IJ SOLN
INTRAMUSCULAR | Status: DC | PRN
  Administered 2018-08-24: 25 ug via INTRAVENOUS

## 2018-08-24 MED ORDER — IOHEXOL 350 MG/ML SOLN
INTRAVENOUS | Status: DC | PRN
  Administered 2018-08-24: 104 mL via INTRA_ARTERIAL

## 2018-08-24 MED ORDER — LIDOCAINE HCL (PF) 1 % IJ SOLN
INTRAMUSCULAR | Status: DC | PRN
  Administered 2018-08-24: 25 mL

## 2018-08-24 MED ORDER — MIDAZOLAM HCL 2 MG/2ML IJ SOLN
INTRAMUSCULAR | Status: AC
  Filled 2018-08-24: qty 2

## 2018-08-24 MED ORDER — MORPHINE SULFATE (PF) 2 MG/ML IV SOLN: 2.0000 mg | INTRAVENOUS | Status: DC | PRN

## 2018-08-24 MED ORDER — ASPIRIN 81 MG PO CHEW
81.0000 mg | CHEWABLE_TABLET | Freq: Every day | ORAL | Status: DC
  Administered 2018-08-25: 81 mg via ORAL
  Filled 2018-08-24: qty 1

## 2018-08-24 MED ORDER — MIDAZOLAM HCL 2 MG/2ML IJ SOLN
INTRAMUSCULAR | Status: DC | PRN
  Administered 2018-08-24: 1 mg via INTRAVENOUS

## 2018-08-24 MED ORDER — SODIUM CHLORIDE 0.9% FLUSH: 3.0000 mL | INTRAVENOUS | Status: DC | PRN

## 2018-08-24 MED ORDER — ONDANSETRON HCL 4 MG/2ML IJ SOLN: 4.0000 mg | Freq: Four times a day (QID) | INTRAMUSCULAR | Status: DC | PRN

## 2018-08-24 MED ORDER — SODIUM CHLORIDE 0.9 % IV SOLN: 250.0000 mL | INTRAVENOUS | Status: DC | PRN

## 2018-08-24 MED ORDER — FENTANYL CITRATE (PF) 100 MCG/2ML IJ SOLN
INTRAMUSCULAR | Status: AC
  Filled 2018-08-24: qty 2

## 2018-08-24 MED ORDER — HEPARIN (PORCINE) IN NACL 1000-0.9 UT/500ML-% IV SOLN
INTRAVENOUS | Status: AC
  Filled 2018-08-24: qty 1000

## 2018-08-24 MED ORDER — LIDOCAINE HCL (PF) 1 % IJ SOLN
INTRAMUSCULAR | Status: AC
  Filled 2018-08-24: qty 30

## 2018-08-24 MED ORDER — SODIUM CHLORIDE 0.9 % IV SOLN
INTRAVENOUS | Status: AC
  Administered 2018-08-24: 18:00:00 via INTRAVENOUS

## 2018-08-24 MED ORDER — ACETAMINOPHEN 325 MG PO TABS: 650.0000 mg | ORAL_TABLET | ORAL | Status: DC | PRN

## 2018-08-24 SURGICAL SUPPLY — 9 items
CATH INFINITI 5FR MULTPACK ANG (CATHETERS) ×2 IMPLANT
CLOSURE MYNX CONTROL 5F (Vascular Products) ×2 IMPLANT
KIT HEART LEFT (KITS) ×2 IMPLANT
PACK CARDIAC CATHETERIZATION (CUSTOM PROCEDURE TRAY) ×2 IMPLANT
SHEATH PINNACLE 5F 10CM (SHEATH) ×2 IMPLANT
SYR MEDRAD MARK 7 150ML (SYRINGE) ×2 IMPLANT
TRANSDUCER W/STOPCOCK (MISCELLANEOUS) ×2 IMPLANT
WIRE EMERALD 3MM-J .035X150CM (WIRE) ×2 IMPLANT
WIRE HI TORQ VERSACORE-J 145CM (WIRE) ×2 IMPLANT

## 2018-08-24 NOTE — Progress Notes (Addendum)
Progress Note  Patient Name: [name] Date of Encounter: 08/24/2018  Primary Cardiologist: Fransico Him, MD   Subjective   Patient denies any further chest pain or shortness of breath.  He is having a mild chest discomfort on the left lateral chest which is different from the chest pain that brought him in. The patient became tearful during exam.  He is anxious about the cath and says that he has no one local to come and support him.  I attempted to provide reassurance.  Inpatient Medications    Scheduled Meds: . ALPRAZolam  1 mg Oral QHS  . amLODipine  10 mg Oral Daily  . aspirin EC  81 mg Oral Daily  . losartan  25 mg Oral Daily  . metoprolol succinate  100 mg Oral QHS  . niacin  500 mg Oral QHS  . pravastatin  40 mg Oral QPM  . sodium chloride flush  3 mL Intravenous Once  . sodium chloride flush  3 mL Intravenous Q12H  . sodium chloride flush  3 mL Intravenous Q12H  . zonisamide  50 mg Oral Daily   Continuous Infusions: . sodium chloride    . sodium chloride    . sodium chloride    . sodium chloride 1 mL/kg/hr (08/24/18 0454)  . heparin 1,350 Units/hr (08/24/18 0545)   PRN Meds: sodium chloride, sodium chloride, acetaminophen, diazepam, nitroGLYCERIN, ondansetron (ZOFRAN) IV, sodium chloride flush, sodium chloride flush   Vital Signs    Vitals:   08/23/18 1201 08/23/18 2053 08/24/18 0516 08/24/18 0843  BP: 130/70 (!) 141/70 130/71 135/67  Pulse: 63 63 63   Resp:  20    Temp: 98.5 F (36.9 C) 98.3 F (36.8 C) 98.6 F (37 C)   TempSrc: Oral Oral Oral   SpO2: 98% 99% 97%   Weight:   70.5 kg   Height:        Intake/Output Summary (Last 24 hours) at 08/24/2018 1120 Last data filed at 08/24/2018 0405 Gross per 24 hour  Intake 759 ml  Output 1450 ml  Net -691 ml   Last 3 Weights 08/24/2018 08/23/2018 08/22/2018  Weight (lbs) 155 lb 6.4 oz 156 lb 8 oz 157 lb 6.4 oz  Weight (kg) 70.489 kg 70.988 kg 71.396 kg      Telemetry    Sinus rhythm in the upper  50s-60s- Personally Reviewed  ECG    New tracings- Personally Reviewed  Physical Exam   GEN: No acute distress.   Neck: No JVD Cardiac: RRR, no murmurs, rubs, or gallops.  Respiratory: Clear to auscultation bilaterally. GI: Soft, nontender, non-distended  MS: No edema; No deformity. Neuro:  Nonfocal  Psych: Normal affect   Labs    Chemistry Recent Labs  Lab 08/21/18 1837 08/22/18 0645 08/24/18 0542 08/24/18 0833  NA 143 145 143 143  K 3.4* 3.7 3.5 3.6  CL 109 109 108 109  CO2 23 25 24 24   GLUCOSE 113* 105* 115* 116*  BUN 14 14 21 20   CREATININE 1.41* 1.40* 1.65* 1.65*  CALCIUM 8.8* 8.5* 8.2* 8.2*  PROT 6.6  --   --   --   ALBUMIN 3.8  --   --   --   AST 59*  --   --   --   ALT 34  --   --   --   ALKPHOS 102  --   --   --   BILITOT 0.6  --   --   --  GFRNONAA 50* 51* 41* 41*  GFRAA 58* 59* 48* 48*  ANIONGAP 11 11 11 10      Hematology Recent Labs  Lab 08/22/18 0645 08/23/18 0417 08/24/18 0353  WBC 9.5 10.3 10.4  RBC 3.52* 3.46* 3.79*  HGB 12.0* 11.7* 12.7*  HCT 35.8* 35.2* 38.4*  MCV 101.7* 101.7* 101.3*  MCH 34.1* 33.8 33.5  MCHC 33.5 33.2 33.1  RDW 12.2 12.1 12.0  PLT 264 256 256    Cardiac Enzymes Recent Labs  Lab 08/22/18 0033 08/22/18 0645  TROPONINI 2.70* 2.35*    Recent Labs  Lab 08/21/18 1842 08/21/18 2146  TROPIPOC 3.31* 2.68*     BNPNo results for input(s): BNP, PROBNP in the last 168 hours.   DDimer No results for input(s): DDIMER in the last 168 hours.   Radiology    No results found.  Cardiac Studies   TTE 08/22/18 1. The left ventricle has normal systolic function of 81-44%. The cavity size is normal. There is mild concentric left ventricular hypertrophy. Echo evidence of normal diastolic filling patterns. Normal left ventricular filling pressures. 2. Normal left atrial size. 3. Normal right atrial size. 4. The mitral valve normal in structure. There is mild mitral annular calcification present. Regurgitation is  trivial by color flow Doppler . No evidence of mitral valve stenosis. 5. Normal tricuspid valve. 6. The aortic valve possibly bicuspid. Aortic valve regurgitation is mild to moderate by color flow Doppler. The jet is eccentric posteriorly directed. 7. Moderate dilatation of the aortic root. 8. No atrial level shunt detected by color flow Doppler. 9. Consider further evaluation of the ascending aorta with CT angiography.   Patient Profile     73 y.o. male with history of coronary artery disease status post CABG in 1998 status post bicuspid AVR with moderate to severe AI and dilated aorta who presented to the hospital with chest pain found to have a non-STEMI  Assessment & Plan    1. Non-STEMI -Troponin peaked at 3.31, trended down to 2.35 -No further chest pain. -On IV heparin drip -Plan for cardiac catheterization today.  Serum creatinine is up from 1.40 to 1.65. Limit contrast.   2.  Bicuspid aortic valve with mild-moderate AR -Follow as outpatient  3.  Hypertension -Blood pressure is well controlled  4.  Stage III CKD -Baseline creatinine 1.4.  Creatinine was 1.4 on presentation, up to 1.65 today.  5.  Hyperlipidemia -On pravastatin 40 mg daily.  LDL is 44, at goal <70.   For questions or updates, please contact [place] Please consult [email or web address] for contact info under        Signed, Daune Perch, NP  08/24/2018, 11:20 AM    Attending Note:   The patient was seen and examined.  Agree with assessment and plan as noted above.  Changes made to the above note as needed.  Patient seen and independently examined with Pecolia Ades, NP .   We discussed all aspects of the encounter. I agree with the assessment and plan as stated above.  1.  NSTEMI:  No further cp Creatinine is up slightly but we have hydrated him well.  I think that we can go ahead and proceed with heart catheterization. We discussed the risk, benefits, options.  He understands and agrees to  proceed.  2.  Hyperlipidemia: His LDL is 44.  Continue pravastatin.  3.  Bicuspid aortic valve.  He has mild to moderate aortic insufficiency.  We will continue to follow.   I have spent  a total of 40 minutes with patient reviewing hospital  notes , telemetry, EKGs, labs and examining patient as well as establishing an assessment and plan that was discussed with the patient. > 50% of time was spent in direct patient care.    Thayer Headings, Brooke Bonito., MD, [facility] 08/24/2018, 11:53 AM [address]  [place] Pager [phone]

## 2018-08-24 NOTE — Progress Notes (Signed)
ANTICOAGULATION CONSULT NOTE  Pharmacy Consult:  Heparin Indication: chest pain/ACS  Allergies  Allergen Reactions  . Mushroom Extract Complex Nausea Only and Other (See Comments)    Severe vertigo and headaches    Patient Measurements: Height: 5\' 7"  (170.2 cm) Weight: 155 lb 6.4 oz (70.5 kg) IBW/kg (Calculated) : 66.1 Heparin Dosing Weight: 73 kg  Vital Signs: Temp: 98.6 F (37 C) (02/03 0516) Temp Source: Oral (02/03 0516) BP: 130/71 (02/03 0516) Pulse Rate: 63 (02/03 0516)  Labs: Recent Labs    08/21/18 1837 08/22/18 0033  08/22/18 0645  08/22/18 1803 08/23/18 0417 08/24/18 0353 08/24/18 0542  HGB 12.3*  --   --  12.0*  --   --  11.7* 12.7*  --   HCT 36.4*  --   --  35.8*  --   --  35.2* 38.4*  --   PLT 269  --   --  264  --   --  256 256  --   LABPROT 13.7  --   --   --   --   --   --   --   --   INR 1.05  --   --   --   --   --   --   --   --   HEPARINUNFRC  --   --    < >  --    < > 0.21* 0.50 0.58  --   CREATININE 1.41*  --   --  1.40*  --   --   --   --  1.65*  TROPONINI  --  2.70*  --  2.35*  --   --   --   --   --    < > = values in this interval not displayed.    Estimated Creatinine Clearance: 38.9 mL/min (A) (by C-G formula based on SCr of 1.65 mg/dL (H)).   Assessment: 46 YOM found to have elevated troponin at PCP office s/p chest pain the night before.  Patient has known CAD history with CABG in 1998.   Heparin level is therapeutic; no bleeding reported.  Goal of Therapy:  Heparin level 0.3-0.7 units/ml Monitor platelets by anticoagulation protocol: Yes   Plan:  Continue heparin infusion at 1350 units/hr Daily heparin level and CBC F/U post cath    [name], PharmD, BCPS, [place] 08/24/2018, 8:15 AM

## 2018-08-24 NOTE — Care Management Important Message (Signed)
Important Message  Patient Details  Name: [name] MRN: [id number] Date of Birth: [birth year]   Medicare Important Message Given:  Yes    Barb Merino Jessenya Berdan 08/24/2018, 4:13 PM

## 2018-08-24 NOTE — H&P (View-Only) (Signed)
Progress Note  Patient Name: [name] Date of Encounter: 08/24/2018  Primary Cardiologist: Fransico Him, MD   Subjective   Patient denies any further chest pain or shortness of breath.  He is having a mild chest discomfort on the left lateral chest which is different from the chest pain that brought him in. The patient became tearful during exam.  He is anxious about the cath and says that he has no one local to come and support him.  I attempted to provide reassurance.  Inpatient Medications    Scheduled Meds: . ALPRAZolam  1 mg Oral QHS  . amLODipine  10 mg Oral Daily  . aspirin EC  81 mg Oral Daily  . losartan  25 mg Oral Daily  . metoprolol succinate  100 mg Oral QHS  . niacin  500 mg Oral QHS  . pravastatin  40 mg Oral QPM  . sodium chloride flush  3 mL Intravenous Once  . sodium chloride flush  3 mL Intravenous Q12H  . sodium chloride flush  3 mL Intravenous Q12H  . zonisamide  50 mg Oral Daily   Continuous Infusions: . sodium chloride    . sodium chloride    . sodium chloride    . sodium chloride 1 mL/kg/hr (08/24/18 0454)  . heparin 1,350 Units/hr (08/24/18 0545)   PRN Meds: sodium chloride, sodium chloride, acetaminophen, diazepam, nitroGLYCERIN, ondansetron (ZOFRAN) IV, sodium chloride flush, sodium chloride flush   Vital Signs    Vitals:   08/23/18 1201 08/23/18 2053 08/24/18 0516 08/24/18 0843  BP: 130/70 (!) 141/70 130/71 135/67  Pulse: 63 63 63   Resp:  20    Temp: 98.5 F (36.9 C) 98.3 F (36.8 C) 98.6 F (37 C)   TempSrc: Oral Oral Oral   SpO2: 98% 99% 97%   Weight:   70.5 kg   Height:        Intake/Output Summary (Last 24 hours) at 08/24/2018 1120 Last data filed at 08/24/2018 0405 Gross per 24 hour  Intake 759 ml  Output 1450 ml  Net -691 ml   Last 3 Weights 08/24/2018 08/23/2018 08/22/2018  Weight (lbs) 155 lb 6.4 oz 156 lb 8 oz 157 lb 6.4 oz  Weight (kg) 70.489 kg 70.988 kg 71.396 kg      Telemetry    Sinus rhythm in the upper  50s-60s- Personally Reviewed  ECG    New tracings- Personally Reviewed  Physical Exam   GEN: No acute distress.   Neck: No JVD Cardiac: RRR, no murmurs, rubs, or gallops.  Respiratory: Clear to auscultation bilaterally. GI: Soft, nontender, non-distended  MS: No edema; No deformity. Neuro:  Nonfocal  Psych: Normal affect   Labs    Chemistry Recent Labs  Lab 08/21/18 1837 08/22/18 0645 08/24/18 0542 08/24/18 0833  NA 143 145 143 143  K 3.4* 3.7 3.5 3.6  CL 109 109 108 109  CO2 23 25 24 24   GLUCOSE 113* 105* 115* 116*  BUN 14 14 21 20   CREATININE 1.41* 1.40* 1.65* 1.65*  CALCIUM 8.8* 8.5* 8.2* 8.2*  PROT 6.6  --   --   --   ALBUMIN 3.8  --   --   --   AST 59*  --   --   --   ALT 34  --   --   --   ALKPHOS 102  --   --   --   BILITOT 0.6  --   --   --  GFRNONAA 50* 51* 41* 41*  GFRAA 58* 59* 48* 48*  ANIONGAP 11 11 11 10      Hematology Recent Labs  Lab 08/22/18 0645 08/23/18 0417 08/24/18 0353  WBC 9.5 10.3 10.4  RBC 3.52* 3.46* 3.79*  HGB 12.0* 11.7* 12.7*  HCT 35.8* 35.2* 38.4*  MCV 101.7* 101.7* 101.3*  MCH 34.1* 33.8 33.5  MCHC 33.5 33.2 33.1  RDW 12.2 12.1 12.0  PLT 264 256 256    Cardiac Enzymes Recent Labs  Lab 08/22/18 0033 08/22/18 0645  TROPONINI 2.70* 2.35*    Recent Labs  Lab 08/21/18 1842 08/21/18 2146  TROPIPOC 3.31* 2.68*     BNPNo results for input(s): BNP, PROBNP in the last 168 hours.   DDimer No results for input(s): DDIMER in the last 168 hours.   Radiology    No results found.  Cardiac Studies   TTE 08/22/18 1. The left ventricle has normal systolic function of 56-25%. The cavity size is normal. There is mild concentric left ventricular hypertrophy. Echo evidence of normal diastolic filling patterns. Normal left ventricular filling pressures. 2. Normal left atrial size. 3. Normal right atrial size. 4. The mitral valve normal in structure. There is mild mitral annular calcification present. Regurgitation is  trivial by color flow Doppler . No evidence of mitral valve stenosis. 5. Normal tricuspid valve. 6. The aortic valve possibly bicuspid. Aortic valve regurgitation is mild to moderate by color flow Doppler. The jet is eccentric posteriorly directed. 7. Moderate dilatation of the aortic root. 8. No atrial level shunt detected by color flow Doppler. 9. Consider further evaluation of the ascending aorta with CT angiography.   Patient Profile     73 y.o. male with history of coronary artery disease status post CABG in 1998 status post bicuspid AVR with moderate to severe AI and dilated aorta who presented to the hospital with chest pain found to have a non-STEMI  Assessment & Plan    1. Non-STEMI -Troponin peaked at 3.31, trended down to 2.35 -No further chest pain. -On IV heparin drip -Plan for cardiac catheterization today.  Serum creatinine is up from 1.40 to 1.65. Limit contrast.   2.  Bicuspid aortic valve with mild-moderate AR -Follow as outpatient  3.  Hypertension -Blood pressure is well controlled  4.  Stage III CKD -Baseline creatinine 1.4.  Creatinine was 1.4 on presentation, up to 1.65 today.  5.  Hyperlipidemia -On pravastatin 40 mg daily.  LDL is 44, at goal <70.   For questions or updates, please contact [place] Please consult [email or web address] for contact info under        Signed, Daune Perch, NP  08/24/2018, 11:20 AM    Attending Note:   The patient was seen and examined.  Agree with assessment and plan as noted above.  Changes made to the above note as needed.  Patient seen and independently examined with Pecolia Ades, NP .   We discussed all aspects of the encounter. I agree with the assessment and plan as stated above.  1.  NSTEMI:  No further cp Creatinine is up slightly but we have hydrated him well.  I think that we can go ahead and proceed with heart catheterization. We discussed the risk, benefits, options.  He understands and agrees to  proceed.  2.  Hyperlipidemia: His LDL is 44.  Continue pravastatin.  3.  Bicuspid aortic valve.  He has mild to moderate aortic insufficiency.  We will continue to follow.   I have spent  a total of 40 minutes with patient reviewing hospital  notes , telemetry, EKGs, labs and examining patient as well as establishing an assessment and plan that was discussed with the patient. > 50% of time was spent in direct patient care.    Thayer Headings, Brooke Bonito., MD, [facility] 08/24/2018, 11:53 AM [address],  [place] Pager [phone]

## 2018-08-24 NOTE — Interval H&P Note (Signed)
Cath Lab Visit (complete for each Cath Lab visit)  Clinical Evaluation Leading to the Procedure:   ACS: Yes.    Non-ACS:    Anginal Classification: CCS III  Anti-ischemic medical therapy: Maximal Therapy (2 or more classes of medications)  Non-Invasive Test Results: No non-invasive testing performed  Prior CABG: Previous CABG      History and Physical Interval Note:  08/24/2018 2:00 PM  [name]  has presented today for surgery, with the diagnosis of NSTEMI  The various methods of treatment have been discussed with the patient and family. After consideration of risks, benefits and other options for treatment, the patient has consented to  Procedure(s): LEFT HEART CATH AND CORONARY ANGIOGRAPHY (N/A) as a surgical intervention .  The patient's history has been reviewed, patient examined, no change in status, stable for surgery.  I have reviewed the patient's chart and labs.  Questions were answered to the patient's satisfaction.     Quay Burow

## 2018-08-25 ENCOUNTER — Encounter (HOSPITAL_COMMUNITY): Payer: Self-pay | Admitting: Cardiovascular Disease

## 2018-08-25 ENCOUNTER — Ambulatory Visit: Payer: Medicare Other | Admitting: Gastroenterology

## 2018-08-25 LAB — BASIC METABOLIC PANEL
Anion gap: 11 (ref 5–15)
BUN: 18 mg/dL (ref 8–23)
CHLORIDE: 107 mmol/L (ref 98–111)
CO2: 24 mmol/L (ref 22–32)
CREATININE: 1.58 mg/dL — AB (ref 0.61–1.24)
Calcium: 8.3 mg/dL — ABNORMAL LOW (ref 8.9–10.3)
GFR calc Af Amer: 51 mL/min — ABNORMAL LOW (ref >60)
GFR calc non Af Amer: 44 mL/min — ABNORMAL LOW (ref >60)
Glucose, Bld: 115 mg/dL — ABNORMAL HIGH (ref 70–99)
Potassium: 3.7 mmol/L (ref 3.5–5.1)
Sodium: 142 mmol/L (ref 135–145)

## 2018-08-25 LAB — CBC
HCT: 35.6 % — ABNORMAL LOW (ref 39.0–52.0)
HEMOGLOBIN: 12 g/dL — AB (ref 13.0–17.0)
MCH: 34.6 pg — AB (ref 26.0–34.0)
MCHC: 33.7 g/dL (ref 30.0–36.0)
MCV: 102.6 fL — ABNORMAL HIGH (ref 80.0–100.0)
Platelets: 233 10*3/uL (ref 150–400)
RBC: 3.47 MIL/uL — AB (ref 4.22–5.81)
RDW: 12 % (ref 11.5–15.5)
WBC: 8.8 10*3/uL (ref 4.0–10.5)
nRBC: 0 % (ref 0.0–0.2)

## 2018-08-25 MED ORDER — NITROGLYCERIN 0.4 MG SL SUBL: 0.4000 mg | SUBLINGUAL_TABLET | SUBLINGUAL | 12 refills | Status: DC | PRN

## 2018-08-25 NOTE — Progress Notes (Signed)
CARDIAC REHAB PHASE I   PRE:  Rate/Rhythm: 17 SR  BP:  Sitting: 142/75      SaO2: 96 RA  MODE:  Ambulation: 470 ft   POST:  Rate/Rhythm: 73 SR  BP:  Sitting: 157/78    SaO2: 97 RA   Pt ambulated 474ft in hallway standby assist with steady gait. Pt denies CP or SOB. Pt given MI book, and heart healthy diet. Reviewed restrictions and exercise guidelines. Will refer to CRP II [place].  [id number] Rufina Falco, RN BSN 08/25/2018 10:02 AM

## 2018-08-25 NOTE — Discharge Summary (Addendum)
Discharge Summary    Patient ID: [name] MRN: [id number]; DOB: [birth year]  Admit date: 08/21/2018 Discharge date: 08/25/2018  Primary Care Provider: Burman Freestone, MD  Primary Cardiologist: Drew Him, MD  Primary Electrophysiologist:  None   Discharge Diagnoses    Principal Problem:   NSTEMI (non-ST elevated myocardial infarction) [facility]) Active Problems:   HTN (hypertension)   Hyperlipidemia   CAD (coronary artery disease)   Aortic insufficiency   Allergies Allergies  Allergen Reactions  . Mushroom Extract Complex Nausea Only and Other (See Comments)    Severe vertigo and headaches    Diagnostic Studies/Procedures    Left Heart cath 08/24/2018  Ost LAD lesion is 100% stenosed.  Ost RCA to Prox RCA lesion is 100% stenosed.  Origin lesion is 100% stenosed.  Prox Cx lesion is 50% stenosed.  IMPRESSION:[name] has what appears to be an occluded left radial graft to the RCA. He has a patent LIMA to the LAD with left to right collaterals and moderate nondominant circumflex disease. I did do a supravalvular aortogram and demonstrated no patent grafts. His LVEDP is normal. Medical therapy will be recommended. A right femoral angiogram was performed, the sheath was removed and a minx closure device was successfully deployed achieving hemostasis. The patient left the lab in stable condition.  Diagnostic  Dominance: Right     TTE2/1/20 1. The left ventricle has normal systolic function of 16-96%. The cavity size is normal. There is mild concentric left ventricular hypertrophy. Echo evidence of normal diastolic filling patterns. Normal left ventricular filling pressures. 2. Normal left atrial size. 3. Normal right atrial size. 4. The mitral valve normal in structure. There is mild mitral annular calcification present. Regurgitation is trivial by color flow Doppler . No evidence of mitral valve stenosis. 5. Normal tricuspid valve. 6. The aortic  valve possibly bicuspid. Aortic valve regurgitation is mild to moderate by color flow Doppler. The jet is eccentric posteriorly directed. 7. Moderate dilatation of the aortic root. 8. No atrial level shunt detected by color flow Doppler. 9. Consider further evaluation of the ascending aorta with CT angiography. _____________   History of Present Illness     [name] is a 73 y.o. male with history of CAD with CABG in 1998,bicuspid AV with moderate to severe AI and dilated aortic root measuring 4.8cm in diameter by TEE 12/2014, hypertension, hyperlipidemia, remote tobacco use (quit in 1988), and CKD stage III. Patient has been doing well from a cardiac standpoint and has been in his usual state of health until yesterday when he developed substernal chest pain with some radiation to left jaw and left arm while walking to a work meeting in [place]. Pain started around 6pm and lasted until 9pm, resolving without treatment. No associated diaphoresis, shortness of breath, nausea/vomiting, or lightheadedness/dizziness with the pain. He reports some mild pedal edema due to osteoarthritis but denies any orthopnea or PND. No recent fevers or illnesses. He drove back to [place] last night where he lived and went to see his PCP today for evaluation of his symptoms. PCP checked an EKG and a troponin. Later this evening, patient got a call from his PCP that his troponin was elevated and was told he needed to call [phone] to be transported to the ED for further evaluation. At time of this evaluation patient reports some minimal chest discomfort that the describes as a "slight sensation of tightness."  In the ED: EKG showed no acute ischemic changes compared to prior tracings.  I-stat troponin elevated at 3.31. Chest x-ray showed no acute findings. CBC 10.3, Hgb 12.3, Plts 269. Na 143, K 3.4, Glucose 113, SCr 1.41 (around baseline).   Hospital Course     Consultants: None  Troponin was up to 3.31 and  trended down.  The patient was treated with IV heparin. Left heart cath done on 08/24/18 showed occluded left radial graft to the RCA.  He has a patent LIMA to the LAD with left to right collaterals and moderate nondominant circumflex disease.  His LVEDP is normal.  Medical therapy is recommended.  Right femoral cath site was closed with a minx closure device, stable today.   Has done well overnight.  No further chest pain. He has been up with cardiac rehab this morning without any problems.  The patient is looking forward to attending cardiac rehab phase 2 as he feels like this will give [name] peace of mind and confidence.  Patient is continued on aspirin 81 mg, beta-blocker, ARB, statin.  Blood pressure has remained stable. He is on pravastatin 40 mg daily.  LDL is 44, at goal of <70.   Patient takes daily Cialis per his urologist.  Discussed with patient that he cannot take daily Cialis and nitroglycerin.  Will provide [name] as needed nitroglycerin and he can follow-up with his urologist regarding ED medication.  Baseline creatinine 1.4.  During hospitalization creatinine was up to 1.65. Creatinine 1.58 this morning, post cath.  We will follow-up metabolic panel in the office.  Bicuspid aortic valve with mild-moderate AR -Echo this admission shows mild-moderate AR.  Moderate dilatation of the aortic root.  4.6 cm, noted to be 5.1 cm last year. -Followed annually in the office.  Patient has been seen by Dr. Acie [name] today and deemed ready for discharge home. All follow up appointments have been scheduled. Discharge medications are listed below.  _____________  Discharge Vitals Blood pressure (!) 142/75, pulse (!) 57, temperature 98.4 F (36.9 C), temperature source Oral, resp. rate 12, height 5\' 7"  (1.702 m), weight 70.8 kg, SpO2 99 %.  Filed Weights   08/23/18 0541 08/24/18 0516 08/25/18 0459  Weight: 71 kg 70.5 kg 70.8 kg    Labs & Radiologic Studies    CBC Recent Labs    08/24/18 0353  08/25/18 0354  WBC 10.4 8.8  HGB 12.7* 12.0*  HCT 38.4* 35.6*  MCV 101.3* 102.6*  PLT 256 563   Basic Metabolic Panel Recent Labs    08/24/18 1205 08/25/18 0354  NA 143 142  K 3.6 3.7  CL 108 107  CO2 24 24  GLUCOSE 107* 115*  BUN 16 18  CREATININE 1.55* 1.58*  CALCIUM 8.3* 8.3*   Liver Function Tests No results for input(s): AST, ALT, ALKPHOS, BILITOT, PROT, ALBUMIN in the last 72 hours. No results for input(s): LIPASE, AMYLASE in the last 72 hours. Cardiac Enzymes No results for input(s): CKTOTAL, CKMB, CKMBINDEX, TROPONINI in the last 72 hours. BNP Invalid input(s): POCBNP D-Dimer No results for input(s): DDIMER in the last 72 hours. Hemoglobin A1C No results for input(s): HGBA1C in the last 72 hours. Fasting Lipid Panel No results for input(s): CHOL, HDL, LDLCALC, TRIG, CHOLHDL, LDLDIRECT in the last 72 hours. Thyroid Function Tests No results for input(s): TSH, T4TOTAL, T3FREE, THYROIDAB in the last 72 hours.  Invalid input(s): FREET3 _____________  Dg Chest 2 View  Result Date: 08/21/2018 CLINICAL DATA:  Chest pain starting last night. Elevated troponin. EXAM: CHEST - 2 VIEW COMPARISON:  None. FINDINGS: Postoperative  changes in the mediastinum. Borderline heart size and pulmonary vascularity. No airspace disease or consolidation in the lungs. Perihilar streaky opacities likely representing chronic bronchitis or reactive airways disease. No blunting of costophrenic angles. No pneumothorax. Calcification of the aorta. IMPRESSION: Chronic bronchitic changes in the lungs. No evidence of active pulmonary disease. Electronically Signed   By: Lucienne Capers M.D.   On: 08/21/2018 19:12   Disposition   Pt is being discharged home today in good condition.  Follow-up Plans & Appointments    Follow-up Information    Daune Perch, NP Follow up.   Specialty:  Cardiology Why:  Cardiology hospital follow-up on 09/03/2018 at 2:30 PM.  Please arrive 15 minutes early for  check-in. Contact information: [place] [address] [phone]          Discharge Instructions    Amb Referral to Cardiac Rehabilitation   Complete by:  As directed    Diagnosis:  NSTEMI   Diet - low sodium heart healthy   Complete by:  As directed    Discharge instructions   Complete by:  As directed    PLEASE REMEMBER TO BRING ALL OF YOUR MEDICATIONS TO EACH OF YOUR FOLLOW-UP OFFICE VISITS.  PLEASE ATTEND ALL SCHEDULED FOLLOW-UP APPOINTMENTS.   Activity: Increase activity slowly as tolerated. You may shower, but no soaking baths (or swimming) for 1 week. No driving for 24 hours. No lifting over 5 lbs for 1 week. No sexual activity for 1 week.   You May Return to Work: in 1 week (if applicable)  Wound Care: You may wash cath site gently with soap and water. Keep cath site clean and dry. If you notice pain, swelling, bleeding or pus at your cath site, please call [phone].   Increase activity slowly   Complete by:  As directed       Discharge Medications   Allergies as of 08/25/2018      Reactions   Mushroom Extract Complex Nausea Only, Other (See Comments)   Severe vertigo and headaches      Medication List    STOP taking these medications   tadalafil 5 MG tablet Commonly known as:  CIALIS     TAKE these medications   ALPRAZolam 1 MG 24 hr tablet Commonly known as:  XANAX XR Take 1 tablet (1 mg total) by mouth daily. What changed:  when to take this   amLODipine 10 MG tablet Commonly known as:  NORVASC TAKE 1 TABLET BY MOUTH DAILY. Please make yearly appt with Dr. Radford Pax for February for future refills. 1st attempt What changed:    how much to take  how to take this  when to take this  additional instructions   ANDROGEL 40.5 MG/2.5GM (1.62%) Gel Generic drug:  Testosterone Place 1 application onto the skin See admin instructions. Apply 1 pump to each shoulder once a day   aspirin EC 81 MG tablet Take 81 mg by mouth  daily.   butalbital-aspirin-caffeine-codeine 50-325-40-30 MG capsule Commonly known as:  FIORINAL WITH CODEINE TAKE ONE CAPSULE BY MOUTH EVERY 4 HOURS AS NEEDED FOR PAIN What changed:    how much to take  how to take this  when to take this  reasons to take this  additional instructions   CENTRUM [employer] Tabs Take 1 tablet by mouth daily with breakfast.   diazepam 5 MG tablet Commonly known as:  VALIUM Take 2.5-5 mg by mouth daily as needed for anxiety.   EXCEDRIN  EXTRA STRENGTH 250-250-65 MG tablet Generic drug:  aspirin-acetaminophen-caffeine Take 2 tablets by mouth every 6 (six) hours as needed for headache.   ibuprofen 200 MG tablet Commonly known as:  ADVIL,MOTRIN Take 200-400 mg by mouth daily as needed (for arthritis in the feet).   losartan 25 MG tablet Commonly known as:  COZAAR Take 1 tablet (25 mg total) by mouth daily.   metoprolol succinate 100 MG 24 hr tablet Commonly known as:  TOPROL-XL TAKE 1 TABLET BY MOUTH DAILY. Please make yearly appt with Dr. Radford Pax for February for future refills. 1st attempt What changed:    how much to take  how to take this  when to take this  additional instructions   niacin 500 MG tablet Take 500 mg by mouth at bedtime.   nitroGLYCERIN 0.4 MG SL tablet Commonly known as:  NITROSTAT Place 1 tablet (0.4 mg total) under the tongue every 5 (five) minutes x 3 doses as needed for chest pain.   pravastatin 40 MG tablet Commonly known as:  PRAVACHOL Take 1 tablet (40 mg total) by mouth every evening. Please make yearly appt with Dr. Radford Pax for February for future refills. 1st attempt What changed:  additional instructions   TURMERIC PO Take 1 capsule by mouth daily with breakfast.   vitamin C 1000 MG tablet Take 1,000 mg by mouth 2 (two) times daily.   vitamin E 400 UNIT capsule Take 400 Units by mouth at bedtime.   zonisamide 25 MG capsule Commonly known as:  ZONEGRAN Take 2 capsules (50 mg total)  by mouth daily.        Acute coronary syndrome (MI, NSTEMI, STEMI, etc) this admission?: Yes.     AHA/ACC Clinical Performance & Quality Measures: 1. Aspirin prescribed? - Yes 2. ADP Receptor Inhibitor (Plavix/Clopidogrel, Brilinta/Ticagrelor or Effient/Prasugrel) prescribed (includes medically managed patients)? - No - No intervention done at cath.  3. Beta Blocker prescribed? - Yes 4. High Intensity Statin (Lipitor 40-80mg  or Crestor 20-40mg ) prescribed? - Yes 5. EF assessed during THIS hospitalization? - Yes 6. For EF <40%, was ACEI/ARB prescribed? - Not Applicable (EF >/= 46%) 7. For EF <40%, Aldosterone Antagonist (Spironolactone or Eplerenone) prescribed? - Not Applicable (EF >/= 96%) 8. Cardiac Rehab Phase II ordered (Included Medically managed Patients)? - Yes     Outstanding Labs/Studies   BMet at follow up.  Duration of Discharge Encounter   Greater than 30 minutes including physician time.  Signed, Daune Perch, NP 08/25/2018, 1:23 PM  Attending Note:   The patient was seen and examined.  Agree with assessment and plan as noted above.  Changes made to the above note as needed.  Patient seen and independently examined with  Pecolia Ades, NP .   We discussed all aspects of the encounter. I agree with the assessment and plan as stated above.  1.  Coronary artery disease:   Presented with chest pain and was found to have a non-ST segment elevation myocardial infarction.  The patient had a heart catheterization which revealed an occluded graft to the right coronary artery.  He has an intact LIMA to the LAD and has collateral filling to the right coronary artery.  Medical therapy has been recommended.  2.  Bicuspid aortic valve: The patient has mild to moderate aortic insufficiency.  He has moderate dilatation of his aortic root.  He will follow-up with Dr. Radford Pax.  3.  Hyperlipidemia.  He is on pravastatin.  His LDL is 44.  Further plans per Dr. Radford Pax  and his  primary medical doctor.  Seems to be doing well.  He has been taking daily Cialis.  We have recommended that he not take daily Cialis so that he can take nitroglycerin if needed. We will discuss this with his urologist about getting the PRN Cialis instead of the daily Cialis.   I have spent a total of 40 minutes with patient reviewing hospital  notes , telemetry, EKGs, labs and examining patient as well as establishing an assessment and plan that was discussed with the patient. > 50% of time was spent in direct patient care.     Mertie Moores, MD  08/25/2018 1:29 PM    [facility] [place],  [place], [place]  [address] Pager [phone] Phone: [phone]; Fax: [phone]

## 2018-08-25 NOTE — Progress Notes (Addendum)
Progress Note  Patient Name: [name] Date of Encounter: 08/25/2018  Primary Cardiologist: Drew Him, MD   Subjective   Patient is feeling well today with no chest pain or shortness of breath.  Right groin site is stable.  He has been up with cardiac rehab walking in the halls without any issues.  Inpatient Medications    Scheduled Meds: . ALPRAZolam  1 mg Oral QHS  . amLODipine  10 mg Oral Daily  . aspirin  81 mg Oral Daily  . atorvastatin  80 mg Oral q1800  . losartan  25 mg Oral Daily  . metoprolol succinate  100 mg Oral QHS  . niacin  500 mg Oral QHS  . sodium chloride flush  3 mL Intravenous Once  . sodium chloride flush  3 mL Intravenous Q12H  . zonisamide  50 mg Oral Daily   Continuous Infusions: . sodium chloride     PRN Meds: sodium chloride, acetaminophen, diazepam, morphine injection, nitroGLYCERIN, ondansetron (ZOFRAN) IV, sodium chloride flush   Vital Signs    Vitals:   08/24/18 1951 08/24/18 2246 08/25/18 0459 08/25/18 0922  BP: 133/73  120/70 (!) 142/75  Pulse: (!) 59 (!) 49 (!) 57   Resp: 14 16 12    Temp: 98.1 F (36.7 C)  98.4 F (36.9 C)   TempSrc: Oral  Oral   SpO2: 100% 98% 99%   Weight:   70.8 kg   Height:        Intake/Output Summary (Last 24 hours) at 08/25/2018 1136 Last data filed at 08/25/2018 0925 Gross per 24 hour  Intake 3 ml  Output -  Net 3 ml   Last 3 Weights 08/25/2018 08/24/2018 08/23/2018  Weight (lbs) 156 lb 1.6 oz 155 lb 6.4 oz 156 lb 8 oz  Weight (kg) 70.806 kg 70.489 kg 70.988 kg      Telemetry    Sinus rhythm 50s-60s- Personally Reviewed  ECG    No new tracings- Personally Reviewed  Physical Exam   GEN: No acute distress.   Neck: No JVD Cardiac: RRR, no murmurs, rubs, or gallops.  Respiratory: Clear to auscultation bilaterally. GI: Soft, nontender, non-distended  MS: No edema; No deformity. Neuro:  Nonfocal  Psych: Normal affect  Right groin site stable, soft, no hematoma  Labs     Chemistry Recent Labs  Lab 08/21/18 1837  08/24/18 0833 08/24/18 1205 08/25/18 0354  NA 143   < > 143 143 142  K 3.4*   < > 3.6 3.6 3.7  CL 109   < > 109 108 107  CO2 23   < > 24 24 24   GLUCOSE 113*   < > 116* 107* 115*  BUN 14   < > 20 16 18   CREATININE 1.41*   < > 1.65* 1.55* 1.58*  CALCIUM 8.8*   < > 8.2* 8.3* 8.3*  PROT 6.6  --   --   --   --   ALBUMIN 3.8  --   --   --   --   AST 59*  --   --   --   --   ALT 34  --   --   --   --   ALKPHOS 102  --   --   --   --   BILITOT 0.6  --   --   --   --   GFRNONAA 50*   < > 41* 45* 44*  GFRAA 58*   < >  48* 52* 51*  ANIONGAP 11   < > 10 11 11    < > = values in this interval not displayed.     Hematology Recent Labs  Lab 08/23/18 0417 08/24/18 0353 08/25/18 0354  WBC 10.3 10.4 8.8  RBC 3.46* 3.79* 3.47*  HGB 11.7* 12.7* 12.0*  HCT 35.2* 38.4* 35.6*  MCV 101.7* 101.3* 102.6*  MCH 33.8 33.5 34.6*  MCHC 33.2 33.1 33.7  RDW 12.1 12.0 12.0  PLT 256 256 233    Cardiac Enzymes Recent Labs  Lab 08/22/18 0033 08/22/18 0645  TROPONINI 2.70* 2.35*    Recent Labs  Lab 08/21/18 1842 08/21/18 2146  TROPIPOC 3.31* 2.68*     BNPNo results for input(s): BNP, PROBNP in the last 168 hours.   DDimer No results for input(s): DDIMER in the last 168 hours.   Radiology    No results found.  Cardiac Studies   Left Heart cath 08/24/2018  Ost LAD lesion is 100% stenosed.  Ost RCA to Prox RCA lesion is 100% stenosed.  Origin lesion is 100% stenosed.  Prox Cx lesion is 50% stenosed.  IMPRESSION: [name] has what appears to be an occluded left radial graft to the RCA.  He has a patent LIMA to the LAD with left to right collaterals and moderate nondominant circumflex disease.  I did do a supravalvular aortogram and demonstrated no patent grafts.  His LVEDP is normal.  Medical therapy will be recommended.  A right femoral angiogram was performed, the sheath was removed and a minx closure device was successfully deployed  achieving hemostasis.  The patient left the lab in stable condition.  Diagnostic  Dominance: Right     TTE2/1/20 1. The left ventricle has normal systolic function of 37-34%. The cavity size is normal. There is mild concentric left ventricular hypertrophy. Echo evidence of normal diastolic filling patterns. Normal left ventricular filling pressures. 2. Normal left atrial size. 3. Normal right atrial size. 4. The mitral valve normal in structure. There is mild mitral annular calcification present. Regurgitation is trivial by color flow Doppler . No evidence of mitral valve stenosis. 5. Normal tricuspid valve. 6. The aortic valve possibly bicuspid. Aortic valve regurgitation is mild to moderate by color flow Doppler. The jet is eccentric posteriorly directed. 7. Moderate dilatation of the aortic root. 8. No atrial level shunt detected by color flow Doppler. 9. Consider further evaluation of the ascending aorta with CT angiography.   Patient Profile     73 y.o. male with history of coronary artery disease status post CABG in 1998 status post bicuspid AVR with moderate to severe AI and dilated aorta who presented to the hospital with chest pain found to have a non-STEMI.   Assessment & Plan    1.  Non-STEMI -Troponin peaked at 3.31, trended down to 2.35 -No further chest pain. -Left heart cath done yesterday showed occluded left radial graft to the RCA.  He has a patent LIMA to the LAD with left to right collaterals and moderate nondominant circumflex disease.  His LVEDP is normal.  Medical therapy is recommended.  Right femoral cath site was closed with a minx closure device.  -Has done well overnight.  He has been up with cardiac rehab this morning without any problems.  The patient is looking forward to attending cardiac rehab phase 2 as he feels like this will give [name] peace of mind and confidence.  2.  CAD -Remote CABG 1998 -Patient is continued on aspirin 81 mg,  beta-blocker, ARB, statin  3.  Bicuspid aortic valve with mild-moderate AR -Echo this admission shows mild-moderate AR.  Moderate dilatation of the aortic root.  4.6 cm, noted to be 5.1 cm last year. -Followed annually in the office.  4.  Hypertension -Continue amlodipine, metoprolol, losartan -Blood pressure well controlled  5.  Stage III CKD -Baseline creatinine 1.4.  During hospitalization creatinine was up to 1.65.  Creatinine 1.58 this morning, post cath.  We will follow-up metabolic panel in the office.  6.  Hyperlipidemia -On pravastatin 40 mg daily.  LDL is 44, at goal of <70.  Serum creatinine was up to 1.65.  This morning creatinine is 1.58 post cath.  For questions or updates, please contact [place] Please consult [email or web address] for contact info under        Signed, Daune Perch, NP  08/25/2018, 11:36 AM    Attending Note:   The patient was seen and examined.  Agree with assessment and plan as noted above.  Changes made to the above note as needed.  Patient seen and independently examined with  Pecolia Ades, NP .   We discussed all aspects of the encounter. I agree with the assessment and plan as stated above.  1.  Coronary artery disease:   Presented with chest pain and was found to have a non-ST segment elevation myocardial infarction.  The patient had a heart catheterization which revealed an occluded graft to the right coronary artery.  He has an intact LIMA to the LAD and has collateral filling to the right coronary artery.  Medical therapy has been recommended.  2.  Bicuspid aortic valve: The patient has mild to moderate aortic insufficiency.  He has moderate dilatation of his aortic root.  He will follow-up with Dr. Radford Pax.  3.  Hyperlipidemia.  He is on pravastatin.  His LDL is 44.  Further plans per Dr. Radford Pax and his primary medical doctor.  Seems to be doing well.  He has been taking daily Cialis.  We have recommended that he not take daily Cialis so  that he can take nitroglycerin if needed. We will discuss this with his urologist about getting the PRN Cialis instead of the daily Cialis.   I have spent a total of 40 minutes with patient reviewing hospital  notes , telemetry, EKGs, labs and examining patient as well as establishing an assessment and plan that was discussed with the patient. > 50% of time was spent in direct patient care.    Thayer Headings, Brooke Bonito., MD, [facility] 08/25/2018, 1:25 PM [address]  [place] Pager [phone]

## 2018-08-26 ENCOUNTER — Telehealth: Payer: Self-pay | Admitting: Cardiovascular Disease

## 2018-08-26 NOTE — Telephone Encounter (Signed)
Pt advised that he may take off the 2X2 gauze pad on his cath site... he can take a shower but no bath and let the soapy water run over it but to be careful not to disrupt any scab formation and to keep it dry... I advised him what to look as for s/sx of infection and there may be a knot under the skin and may have some bruising. Pt verbalized understanding. Will keep f/u 09/03/18.

## 2018-08-26 NOTE — Telephone Encounter (Signed)
New Message   Patient wants to know if it's ok to remove the dressings off his heart cath surgery site, and has a few more questions.

## 2018-08-27 ENCOUNTER — Telehealth (HOSPITAL_COMMUNITY): Payer: Self-pay

## 2018-08-27 NOTE — Telephone Encounter (Signed)
Pt insurance is active and benefits verified through [employer] A/[employer]. Co-pay $0.00, DED $198.00/$0.00 met, out of pocket $0.00/$0.00 met, co-insurance 20%. No pre-authorization required. Passport, 08/27/2018 @ 8:33AM, REF# [id number]  2ndary insurance is active and benefits verified through [facility]. Co-pay $0.00, DED $0.00/$0.00 met, out of pocket $0.00/$0.00 met, co-insurance $0.00. No pre-authorization required. Passport, 08/27/2018 @ 8:35AM, REF# [id number]  Will contact patient to see if he is interested in the Cardiac Rehab Program. If interested, patient will need to complete follow up appt. Once completed, patient will be contacted for scheduling upon review by the RN Navigator.

## 2018-08-27 NOTE — Telephone Encounter (Signed)
Called patient to see if he is interested in the Cardiac Rehab Program. Patient expressed interest. Explained scheduling process and went over insurance, patient verbalized understanding. Will contact patient for scheduling once f/u has been completed.  °

## 2018-09-03 ENCOUNTER — Encounter: Payer: Self-pay | Admitting: Cardiology

## 2018-09-03 ENCOUNTER — Ambulatory Visit (INDEPENDENT_AMBULATORY_CARE_PROVIDER_SITE_OTHER): Payer: Medicare Other | Admitting: Cardiology

## 2018-09-03 VITALS — BP 130/66 | HR 72 | Ht 67.0 in | Wt 154.1 lb

## 2018-09-03 DIAGNOSIS — E7800 Pure hypercholesterolemia, unspecified: Secondary | ICD-10-CM

## 2018-09-03 DIAGNOSIS — I7121 Aneurysm of the ascending aorta, without rupture: Secondary | ICD-10-CM

## 2018-09-03 DIAGNOSIS — I10 Essential (primary) hypertension: Secondary | ICD-10-CM

## 2018-09-03 DIAGNOSIS — I712 Thoracic aortic aneurysm, without rupture: Secondary | ICD-10-CM

## 2018-09-03 DIAGNOSIS — I214 Non-ST elevation (NSTEMI) myocardial infarction: Secondary | ICD-10-CM | POA: Diagnosis not present

## 2018-09-03 DIAGNOSIS — I2510 Atherosclerotic heart disease of native coronary artery without angina pectoris: Secondary | ICD-10-CM

## 2018-09-03 MED ORDER — ROSUVASTATIN CALCIUM 20 MG PO TABS: 20.0000 mg | ORAL_TABLET | Freq: Every day | ORAL | 3 refills | Status: DC

## 2018-09-03 NOTE — Progress Notes (Signed)
Cardiology Office Note:    Date:  09/03/2018   ID:  JD MCCASTER, DOB [birth year], MRN [id number]  PCP:  Burman Freestone, MD  Cardiologist:  Fransico Him, MD  Referring MD: Burman Freestone, MD   Chief Complaint  Patient presents with  . Hospitalization Follow-up    Post MI    History of Present Illness:    [name] is a 73 y.o. male with a past medical history significant for CAD with CABG in 1998,bicuspid AV with moderate to severe AI and dilated aortic root measuring 4.8cm in diameter by TEE 12/2014, hypertension, hyperlipidemia, remote tobacco use (quit in 1988), and CKD stage III.  The patient was doing well until 08/21/2018 when he developed chest pain radiating to the left jaw and left arm while at a work meeting in [place].  He drove back to [facility] and went to see his PCP for evaluation.  A troponin was checked and was elevated so he was told to call 911 and go to the ED.  In the ED his EKG was nonischemic and troponin was 3.31. Left heart cath done on 08/24/18 showed occluded left radial graft to the RCA. He has a patent LIMA to the LAD with left to right collaterals and moderate nondominant circumflex disease. His LVEDP is normal.Medical therapy was recommended.  The patient plans to attend cardiac rehab phase 2.  Patient is continued on aspirin 81 mg, beta-blocker, ARB, statin. He is on pravastatin 40 mg daily. LDL is 44, at goal of <70.Baseline creatinine 1.4. During hospitalization creatinine was up to 1.65. Creatinine 1.58, post cath.   [name] is here today for hospital follow up. He is feeling very well with no chest pain or shortness of breath. He is pretty active . He uses the treadmill for 20 minutes with no exertional symptoms. No orthopnea, PND, edema, palpitations, lightheadedness.   He has a girlfriend and wants to get back to an active sex life. He asks about use of ED medication. Per conversation with Dr. Acie Fredrickson in the hospital, we recommend  use of a short activing med like viagra instead of daily cialis.  The as needed medication would only be used several times a month and would allow the patient to use sublingual nitroglycerin at other times if he needed it.  He verbalizes complete understanding.  He is anxious to start CR phase II. He also plans to continue in the maintenance program.   Uses CPAP routinely every night at least 6-7 hours and feels much better using it.     Past Medical History:  Diagnosis Date  . Arthritis   . Bicuspid aortic valve    with mild to moderate AR by echo 10/2017  . BPH (benign prostatic hyperplasia)   . CAD (coronary artery disease)    s/p CABG [place]  . Depression   . Dilated aortic root (HCC)    34mm aortic root and 17mm ascending aorta by echo 10/2017  . History of blood transfusion 1998   "due to cardiac bypass surgery"  . Hyperlipidemia   . Hypertension   . Hypogonadism in male   . Migraine   . Retinal hemorrhage 11/04/2014    Past Surgical History:  Procedure Laterality Date  . CORONARY ARTERY BYPASS GRAFT  1998  . LEFT HEART CATH AND CORONARY ANGIOGRAPHY N/A 08/24/2018   Procedure: LEFT HEART CATH AND CORONARY ANGIOGRAPHY;  Surgeon: Lorretta Harp, MD;  Location: [facility];  Service:  Cardiovascular;  Laterality: N/A;  . TEE WITHOUT CARDIOVERSION N/A 01/19/2015   Procedure: TRANSESOPHAGEAL ECHOCARDIOGRAM (TEE);  Surgeon: Sueanne Margarita, MD;  Location: [facility];  Service: Cardiovascular;  Laterality: N/A;  . VARICOSE VEIN SURGERY  1984    Current Medications: Current Meds  Medication Sig  . ALPRAZolam (XANAX) 1 MG tablet Take 1 mg by mouth at bedtime as needed for anxiety.  Marland Kitchen amLODipine (NORVASC) 10 MG tablet Take 10 mg by mouth daily.  . Ascorbic Acid (VITAMIN C) 1000 MG tablet Take 1,000 mg by mouth 2 (two) times daily.  Marland Kitchen aspirin EC 81 MG tablet Take 81 mg by mouth daily.  Marland Kitchen aspirin-acetaminophen-caffeine (EXCEDRIN EXTRA STRENGTH) 250-250-65 MG  tablet Take 2 tablets by mouth every 6 (six) hours as needed for headache.  . butalbital-aspirin-caffeine-codeine (FIORINAL WITH CODEINE) 50-325-40-30 MG capsule TAKE ONE CAPSULE BY MOUTH EVERY 4 HOURS AS NEEDED FOR PAIN (Patient taking differently: Take 2 capsules by mouth every 4 (four) hours as needed for migraine. )  . diazepam (VALIUM) 5 MG tablet Take 2.5-5 mg by mouth daily as needed for anxiety.  Marland Kitchen ibuprofen (ADVIL,MOTRIN) 200 MG tablet Take 200-400 mg by mouth daily as needed (for arthritis in the feet).  . metoprolol succinate (TOPROL-XL) 100 MG 24 hr tablet Take 100 mg by mouth at bedtime. Take with or immediately following a meal.  . niacin 500 MG tablet Take 500 mg by mouth at bedtime.  . nitroGLYCERIN (NITROSTAT) 0.4 MG SL tablet Place 1 tablet (0.4 mg total) under the tongue every 5 (five) minutes x 3 doses as needed for chest pain.  Marland Kitchen Specialty Vitamins Products (CENTRUM SPECIALIST ENERGY) TABS Take 1 tablet by mouth daily with breakfast.  . Testosterone (ANDROGEL) 40.5 MG/2.5GM (1.62%) GEL Place 1 application onto the skin See admin instructions. Apply 1 pump to each shoulder once a day  . TURMERIC PO Take 1 capsule by mouth daily with breakfast.   . vitamin E 400 UNIT capsule Take 400 Units by mouth at bedtime.   Marland Kitchen zonisamide (ZONEGRAN) 25 MG capsule Take 2 capsules (50 mg total) by mouth daily.  . [DISCONTINUED] pravastatin (PRAVACHOL) 40 MG tablet Take 40 mg by mouth daily.     Allergies:   Mushroom extract complex   Social History   Socioeconomic History  . Marital status: Divorced    Spouse name: Not on file  . Number of children: Not on file  . Years of education: Not on file  . Highest education level: Not on file  Occupational History  . Not on file  Social Needs  . Financial resource strain: Not on file  . Food insecurity:    Worry: Not on file    Inability: Not on file  . Transportation needs:    Medical: Not on file    Non-medical: Not on file  Tobacco  Use  . Smoking status: Former Smoker    Years: 20.00    Last attempt to quit: 07/22/1986    Years since quitting: 32.1  . Smokeless tobacco: Never Used  Substance and Sexual Activity  . Alcohol use: Yes    Comment: 3-4 weekly  . Drug use: No  . Sexual activity: Not on file  Lifestyle  . Physical activity:    Days per week: Not on file    Minutes per session: Not on file  . Stress: Not on file  Relationships  . Social connections:    Talks on phone: Not on file    Gets  together: Not on file    Attends religious service: Not on file    Active member of club or organization: Not on file    Attends meetings of clubs or organizations: Not on file    Relationship status: Not on file  Other Topics Concern  . Not on file  Social History Narrative   Divorced   Secondary school teacher- retired   No children   Has a Neurosurgeon named [name]   Enjoys outdoor activities- hiking, swimming, Control and instrumentation engineer, baseball, writing, reading, cooking     Family History: The patient's family history includes Arthritis in his father and mother; Heart disease (age of onset: 33) in his father; Hyperlipidemia in his father and mother; Hypertension in his father; Kidney disease in his paternal grandfather; Stroke (age of onset: 66) in his father. ROS:   Please see the history of present illness.     All other systems reviewed and are negative.  EKGs/Labs/Other Studies Reviewed:    The following studies were reviewed today:  LeftHeart cath 08/24/2018  Ost LAD lesion is 100% stenosed.  Ost RCA to Prox RCA lesion is 100% stenosed.  Origin lesion is 100% stenosed.  Prox Cx lesion is 50% stenosed.  IMPRESSION:[name] has what appears to be an occluded left radial graft to the RCA. He has a patent LIMA to the LAD with left to right collaterals and moderate nondominant circumflex disease. I did do a supravalvular aortogram and demonstrated no patent grafts. His LVEDP is normal. Medical therapy will be recommended.  A right femoral angiogram was performed, the sheath was removed and a minx closure device was successfully deployed achieving hemostasis. The patient left the lab in stable condition.  Diagnostic  Dominance: Right     TTE2/1/20 1. The left ventricle has normal systolic function of 89-38%. The cavity size is normal. There is mild concentric left ventricular hypertrophy. Echo evidence of normal diastolic filling patterns. Normal left ventricular filling pressures. 2. Normal left atrial size. 3. Normal right atrial size. 4. The mitral valve normal in structure. There is mild mitral annular calcification present. Regurgitation is trivial by color flow Doppler . No evidence of mitral valve stenosis. 5. Normal tricuspid valve. 6. The aortic valve possibly bicuspid. Aortic valve regurgitation is mild to moderate by color flow Doppler. The jet is eccentric posteriorly directed. 7. Moderate dilatation of the aortic root. 8. No atrial level shunt detected by color flow Doppler. 9. Consider further evaluation of the ascending aorta with CT angiography.    EKG:  EKG is not ordered today.    Recent Labs: 08/21/2018: ALT 34; Magnesium 2.0 08/25/2018: BUN 18; Creatinine, Ser 1.58; Hemoglobin 12.0; Platelets 233; Potassium 3.7; Sodium 142   Recent Lipid Panel    Component Value Date/Time   CHOL 87 08/22/2018 0645   CHOL 116 12/08/2017 0839   TRIG 58 08/22/2018 0645   HDL 31 (L) 08/22/2018 0645   HDL 37 (L) 12/08/2017 0839   CHOLHDL 2.8 08/22/2018 0645   VLDL 12 08/22/2018 0645   LDLCALC 44 08/22/2018 0645   LDLCALC 55 12/08/2017 0839    Physical Exam:    VS:  BP 130/66   Pulse 72   Ht 5\' 7"  (1.702 m)   Wt 154 lb 1.9 oz (69.9 kg)   SpO2 97%   BMI 24.14 kg/m     Wt Readings from Last 3 Encounters:  09/03/18 154 lb 1.9 oz (69.9 kg)  08/25/18 156 lb 1.6 oz (70.8 kg)  04/20/18 157 lb 6.4  oz (71.4 kg)     Physical Exam  Constitutional: He is oriented to person, place,  and time. He appears well-developed and well-nourished. No distress.  HENT:  Head: Normocephalic and atraumatic.  Neck: Normal range of motion. Neck supple. No JVD present.  Cardiovascular: Normal rate, regular rhythm, normal heart sounds and intact distal pulses. Exam reveals no gallop and no friction rub.  No murmur heard. Pulmonary/Chest: Effort normal and breath sounds normal. No respiratory distress. He has no wheezes. He has no rales.  Abdominal: Soft. Bowel sounds are normal.  Musculoskeletal: Normal range of motion.        General: No deformity or edema.  Neurological: He is alert and oriented to person, place, and time.  Skin: Skin is warm and dry.  Psychiatric: He has a normal mood and affect. His behavior is normal. Judgment and thought content normal.  Vitals reviewed.    ASSESSMENT:    1. NSTEMI (non-ST elevated myocardial infarction) ([place])   2. Essential hypertension   3. Pure hypercholesterolemia   4. Ascending aortic aneurysm (HCC)    PLAN:    In order of problems listed above:  CAD/NSTEMI -Recent chest pain with troponin up to 3.3. LHC on 08/24/18 showed occluded radial graft to the RCA. He has a patent LIMA to the LAD with left to right collaterals and moderate nondominant circumflex disease. His LVEDP is normal.Medical therapy is recommended. -aspirin 81 mg, beta-blocker, ARB, statin.  -Pt is anxious to get to CR phase 2. He is already walking 30 minutes on the treadmill and is OK to start CR when they can take him.  -He is working on his diet and lowering his salt intake.   Hypertension -On amlodipine 10 mg, losartan 25 mg, Toprol-XL 100 mg -Bp well controlled.   Hyperlipidemia -On pravastatin 40 mg daily.  LDL is 44, at goal of <70.  -Patient is concerned about this recent blockage of his bypass graft despite a fairly good diet, regular exercise and good cholesterol management on statin.  He has read about lipid profile and would like to have advanced  lipid evaluation.  I spoke to [place] who is our pharmacist who specializes in lipid management.  We will order an NMR lipid profile.  When resulted I will forward to [name] for her input on further management.  Jinny Blossom also advised to switch to a high intensity statin so we will switch from pravastatin to rosuvastatin 20 mg daily.  This has been fully discussed with the patient and he is in agreement.  Bicuspid aortic valve with mild-moderate AR -Echo this admission shows mild-moderate AR. Moderate dilatation of the aortic root. 4.6 cm,noted to be 5.1 cm last year. -Followed annually in the office. Next will be 08/2019.  Dilated aortic root -As noted above -Followed by Dr. Clementeen Graham at [facility], has CT anually with next scheduled for November 2020.   Medication Adjustments/Labs and Tests Ordered: Current medicines are reviewed at length with the patient today.  Concerns regarding medicines are outlined above. Labs and tests ordered and medication changes are outlined in the patient instructions below:  Patient Instructions  Medication Instructions:  1.) STOP: Pravastatin  2.) START: Rosuvastatin 20 mg daily   If you need a refill on your cardiac medications before your next appointment, please call your pharmacy.   Lab work: TODAY: Bmet, Apolipoprotein B, Lipoprotein A & NMR lipoprotein   If you have labs (blood work) drawn today and your tests are completely normal, you will receive your  results only by: Marland Kitchen MyChart Message (if you have MyChart) OR . A paper copy in the mail If you have any lab test that is abnormal or we need to change your treatment, we will call you to review the results.  Testing/Procedures: Your physician has requested that you have an echocardiogram in February 2021 Echocardiography is a painless test that uses sound waves to create images of your heart. It provides your doctor with information about the size and shape of your heart and how well your heart's chambers and  valves are working. This procedure takes approximately one hour. There are no restrictions for this procedure.  Follow-Up: At [name], you and your health needs are our priority.  As part of our continuing mission to provide you with exceptional heart care, we have created designated Provider Care Teams.  These Care Teams include your primary Cardiologist (physician) and Advanced Practice Providers (APPs -  Physician Assistants and Nurse Practitioners) who all work together to provide you with the care you need, when you need it. You will need a follow up appointment in 6 months.  Please call our office 2 months in advance to schedule this appointment.  You may see Fransico Him, MD or one of the following Advanced Practice Providers on your designated Care Team:   [place], PA-C Melina Copa, PA-C . Ermalinda Barrios, PA-C  Any Other Special Instructions Will Be Listed Below (If Applicable).       Signed, Daune Perch, NP  09/03/2018 6:57 PM    [facility]

## 2018-09-03 NOTE — Patient Instructions (Addendum)
Medication Instructions:  1.) STOP: Pravastatin  2.) START: Rosuvastatin 20 mg daily   If you need a refill on your cardiac medications before your next appointment, please call your pharmacy.   Lab work: TODAY: Bmet, Apolipoprotein B, Lipoprotein A & NMR lipoprotein   If you have labs (blood work) drawn today and your tests are completely normal, you will receive your results only by: Marland Kitchen MyChart Message (if you have MyChart) OR . A paper copy in the mail If you have any lab test that is abnormal or we need to change your treatment, we will call you to review the results.  Testing/Procedures: Your physician has requested that you have an echocardiogram in February 2021 Echocardiography is a painless test that uses sound waves to create images of your heart. It provides your doctor with information about the size and shape of your heart and how well your heart's chambers and valves are working. This procedure takes approximately one hour. There are no restrictions for this procedure.  Follow-Up: At [facility], you and your health needs are our priority.  As part of our continuing mission to provide you with exceptional heart care, we have created designated Provider Care Teams.  These Care Teams include your primary Cardiologist (physician) and Advanced Practice Providers (APPs -  Physician Assistants and Nurse Practitioners) who all work together to provide you with the care you need, when you need it. You will need a follow up appointment in 6 months.  Please call our office 2 months in advance to schedule this appointment.  You may see Fransico Him, MD or one of the following Advanced Practice Providers on your designated Care Team:   [place], PA-C Melina Copa, PA-C . Ermalinda Barrios, PA-C  Any Other Special Instructions Will Be Listed Below (If Applicable).

## 2018-09-05 LAB — BASIC METABOLIC PANEL
BUN/Creatinine Ratio: 16 (ref 10–24)
BUN: 22 mg/dL (ref 8–27)
CO2: 23 mmol/L (ref 20–29)
Calcium: 9.3 mg/dL (ref 8.6–10.2)
Chloride: 102 mmol/L (ref 96–106)
Creatinine, Ser: 1.38 mg/dL — ABNORMAL HIGH (ref 0.76–1.27)
GFR calc non Af Amer: 51 mL/min/{1.73_m2} — ABNORMAL LOW (ref >59)
GFR, EST AFRICAN AMERICAN: 59 mL/min/{1.73_m2} — AB (ref >59)
Glucose: 111 mg/dL — ABNORMAL HIGH (ref 65–99)
Potassium: 4.6 mmol/L (ref 3.5–5.2)
Sodium: 142 mmol/L (ref 134–144)

## 2018-09-05 LAB — LIPOPROTEIN A (LPA): LIPOPROTEIN (A): 11 nmol/L (ref <75.0)

## 2018-09-05 LAB — APOLIPOPROTEIN B: Apolipoprotein B: 62 mg/dL (ref <90)

## 2018-09-05 LAB — NMR, LIPOPROFILE
Cholesterol, Total: 137 mg/dL (ref 100–199)
HDL Particle Number: 30.6 umol/L (ref >=30.5)
HDL-C: 34 mg/dL — AB (ref >39)
LDL Particle Number: 385 nmol/L (ref <1000)
LDL Size: 19.6 nm — ABNORMAL LOW (ref >20.5)
LDL-C: 57 mg/dL (ref 0–99)
LP-IR Score: 62 — ABNORMAL HIGH (ref <=45)
Small LDL Particle Number: 324 nmol/L (ref <=527)
Triglycerides: 232 mg/dL — ABNORMAL HIGH (ref 0–149)

## 2018-09-07 ENCOUNTER — Telehealth: Payer: Self-pay | Admitting: Neurology

## 2018-09-07 NOTE — Telephone Encounter (Signed)
Attempted to call patient in regards to Cardiac Rehab - LM on VM °Gloria W. Support Rep II °

## 2018-09-07 NOTE — Telephone Encounter (Signed)
Patient called back and wanted to scheduled he was interested in participating in the Cardiac Rehab Program. Patient stated yes. Patient will come in for orientation on 10/06/2018 @ 8:30am and will attend the 8:15am exercise class.  Mailed homework package. Tedra [place]. Support Rep II

## 2018-09-07 NOTE — Telephone Encounter (Signed)
Pt is leaving for [place] the end of April thru end of June. He is wanting to be seen prior to leaving. Does Dr D have an availability since Jinny Blossom will be out. Please call to advise

## 2018-09-08 ENCOUNTER — Encounter: Payer: Self-pay | Admitting: Adult Health

## 2018-09-08 ENCOUNTER — Ambulatory Visit: Payer: Medicare Other | Admitting: Physician Assistant

## 2018-09-08 NOTE — Telephone Encounter (Signed)
Called the patient back to inform him that as of now Dr Brett Fairy doesn't have anything available. Advised that I could offer a one time apt with another NP while Jinny Blossom is out. Patient states that something fail through and he may not make the trip after all. He asked to keep his June 9th apt wth Jinny Blossom and if something works out he will call back. I went ahead and placed the patient back on June 9th at 8:30 am as he was previously scheduled.Pt verbalized understanding.

## 2018-09-08 NOTE — Telephone Encounter (Signed)
Pt called and stated he is not able to come in for orientation on 10/06/2018, pt reschedule for 10/08/2018 @ 830AM

## 2018-09-16 ENCOUNTER — Telehealth (HOSPITAL_COMMUNITY): Payer: Self-pay

## 2018-09-16 NOTE — Telephone Encounter (Signed)
Pt called and stated that he wanted to start program earlier and see if he can get in early. Changed pt to come in for orientation 3/10 @ 7:45am with exc starting 3/20 @ 1:15pm. [name]. Support Rep II

## 2018-09-17 ENCOUNTER — Telehealth (HOSPITAL_COMMUNITY): Payer: Self-pay | Admitting: Pharmacist

## 2018-09-18 ENCOUNTER — Telehealth (HOSPITAL_COMMUNITY): Payer: Self-pay

## 2018-09-21 ENCOUNTER — Ambulatory Visit (INDEPENDENT_AMBULATORY_CARE_PROVIDER_SITE_OTHER): Payer: Medicare Other | Admitting: Gastroenterology

## 2018-09-21 ENCOUNTER — Encounter: Payer: Self-pay | Admitting: Gastroenterology

## 2018-09-21 ENCOUNTER — Other Ambulatory Visit (INDEPENDENT_AMBULATORY_CARE_PROVIDER_SITE_OTHER): Payer: Medicare Other

## 2018-09-21 VITALS — BP 108/60 | HR 76 | Ht 65.25 in | Wt 156.2 lb

## 2018-09-21 DIAGNOSIS — R1030 Lower abdominal pain, unspecified: Secondary | ICD-10-CM

## 2018-09-21 DIAGNOSIS — I2510 Atherosclerotic heart disease of native coronary artery without angina pectoris: Secondary | ICD-10-CM

## 2018-09-21 DIAGNOSIS — K625 Hemorrhage of anus and rectum: Secondary | ICD-10-CM

## 2018-09-21 DIAGNOSIS — D649 Anemia, unspecified: Secondary | ICD-10-CM

## 2018-09-21 LAB — IBC + FERRITIN
Ferritin: 71.2 ng/mL (ref 22.0–322.0)
Iron: 98 ug/dL (ref 42–165)
Saturation Ratios: 30.3 % (ref 20.0–50.0)
Transferrin: 231 mg/dL (ref 212.0–360.0)

## 2018-09-21 LAB — CBC WITH DIFFERENTIAL/PLATELET
Basophils Absolute: 0 10*3/uL (ref 0.0–0.1)
Basophils Relative: 0.2 % (ref 0.0–3.0)
Eosinophils Absolute: 0.3 10*3/uL (ref 0.0–0.7)
Eosinophils Relative: 3 % (ref 0.0–5.0)
HCT: 39.5 % (ref 39.0–52.0)
Hemoglobin: 13.6 g/dL (ref 13.0–17.0)
Lymphocytes Relative: 20.7 % (ref 12.0–46.0)
Lymphs Abs: 1.9 10*3/uL (ref 0.7–4.0)
MCHC: 34.4 g/dL (ref 30.0–36.0)
MCV: 100.3 fl — ABNORMAL HIGH (ref 78.0–100.0)
Monocytes Absolute: 0.8 10*3/uL (ref 0.1–1.0)
Monocytes Relative: 8.6 % (ref 3.0–12.0)
Neutro Abs: 6.4 10*3/uL (ref 1.4–7.7)
Neutrophils Relative %: 67.5 % (ref 43.0–77.0)
PLATELETS: 248 10*3/uL (ref 150.0–400.0)
RBC: 3.93 Mil/uL — ABNORMAL LOW (ref 4.22–5.81)
RDW: 12.4 % (ref 11.5–15.5)
WBC: 9.4 10*3/uL (ref 4.0–10.5)

## 2018-09-21 NOTE — Progress Notes (Signed)
HPI :   73 y/o male with a history of CAD s/p CABG, dilated aortic root, OSA, referred here by Burman Freestone, MD for positive stool test and abdominal pain.  The patient states he was in his usual state of health and gained about 14 pounds after which she states was overeating around the holidays in November and December. When this happened he developed some lower abdominal discomfort in his mid lower abdomen. States it comes and goes. No clear precipitants, it's unchanged with bowel habits and not reproduced with eating. He states he changed his diet around significantly and worked to lose the weight, he is lost about 10 pounds since January. Since doing this he states he is feeling much better and is not having much pain any more. He's been eating okay without nausea or vomiting. He is having regular bowel habits at this time. He does endorse some sporadic blood in the stools over the past several years which he attributes to hemorrhoids. He reports a low volume of blood noted on toilet paper with wiping, blood in the toilet. He has a history of an anal fissure requiring surgery in 2000. He denies any rectal pain at this time. He had a stool test (FIT) which was positive. He denies any family history of colon cancer. He reportedly had a colonoscopy in 2011 which she states was normal, no records available.  Of note he was admitted to the hospital on January 30 of this year with an NSTEMI. He had a cardiac catheterization showing an occluded graft of RCA development of collaterals. It was decided to treat medically and no stenting was performed. He had an echocardiogram showing EF of 60-65%. He takes a baby aspirin but is not on any other anticoagulation. He is scheduled to start cardiac rehabilitation perform an 8-10 week course. Of note he does have a mild anemia with hemoglobin in the 12s with a macrocytosis. He denies any routine alcohol use.  Past Medical History:  Diagnosis Date  . Anal  fissure   . Arthritis   . Bicuspid aortic valve    with mild to moderate AR by echo 10/2017  . BPH (benign prostatic hyperplasia)   . CAD (coronary artery disease)    s/p CABG [facility] [place]  . Depression   . Dilated aortic root (HCC)    31mm aortic root and 23mm ascending aorta by echo 10/2017  . History of blood transfusion 1998   "due to cardiac bypass surgery"  . Hyperlipidemia   . Hypertension   . Hypogonadism in male   . Kidney stones   . Migraine   . Retinal hemorrhage 11/04/2014  . Sleep apnea with use of continuous positive airway pressure (CPAP)      Past Surgical History:  Procedure Laterality Date  . CORONARY ARTERY BYPASS GRAFT  1998  . LEFT HEART CATH AND CORONARY ANGIOGRAPHY N/A 08/24/2018   Procedure: LEFT HEART CATH AND CORONARY ANGIOGRAPHY;  Surgeon: Lorretta Harp, MD;  Location: [facility];  Service: Cardiovascular;  Laterality: N/A;  . TEE WITHOUT CARDIOVERSION N/A 01/19/2015   Procedure: TRANSESOPHAGEAL ECHOCARDIOGRAM (TEE);  Surgeon: Sueanne Margarita, MD;  Location: [facility];  Service: Cardiovascular;  Laterality: N/A;  . [place]   pt denies   Family History  Problem Relation Age of Onset  . Arthritis Mother        deceased  . Hyperlipidemia Mother   . Diabetes Mother   . Arthritis  Father   . Hyperlipidemia Father   . Heart disease Father 9  . Stroke Father 17       deceased  . Hypertension Father   . Diabetes Father   . Kidney disease Paternal Grandfather    Social History   Tobacco Use  . Smoking status: Former Smoker    Years: 20.00    Last attempt to quit: 07/22/1986    Years since quitting: 32.1  . Smokeless tobacco: Never Used  Substance Use Topics  . Alcohol use: Yes    Comment: 0-1 daily  . Drug use: No   Current Outpatient Medications  Medication Sig Dispense Refill  . ALPRAZolam (XANAX) 1 MG tablet Take 1 mg by mouth at bedtime as needed for anxiety.    Marland Kitchen amLODipine (NORVASC) 10 MG tablet  Take 10 mg by mouth daily.    . Ascorbic Acid (VITAMIN C) 1000 MG tablet Take 1,000 mg by mouth 2 (two) times daily.    Marland Kitchen aspirin EC 81 MG tablet Take 81 mg by mouth daily.    Marland Kitchen aspirin-acetaminophen-caffeine (EXCEDRIN EXTRA STRENGTH) 250-250-65 MG tablet Take 2 tablets by mouth every 6 (six) hours as needed for headache.    . butalbital-aspirin-caffeine-codeine (FIORINAL WITH CODEINE) 50-325-40-30 MG capsule TAKE ONE CAPSULE BY MOUTH EVERY 4 HOURS AS NEEDED FOR PAIN (Patient taking differently: Take 2 capsules by mouth every 4 (four) hours as needed for migraine. ) 15 capsule 0  . diazepam (VALIUM) 5 MG tablet Take 2.5-5 mg by mouth daily as needed for anxiety.    Marland Kitchen ibuprofen (ADVIL,MOTRIN) 200 MG tablet Take 200-400 mg by mouth daily as needed (for arthritis in the feet).    . metoprolol succinate (TOPROL-XL) 100 MG 24 hr tablet Take 100 mg by mouth at bedtime. Take with or immediately following a meal.    . niacin 500 MG tablet Take 500 mg by mouth at bedtime.    . nitroGLYCERIN (NITROSTAT) 0.4 MG SL tablet Place 1 tablet (0.4 mg total) under the tongue every 5 (five) minutes x 3 doses as needed for chest pain. 25 tablet 12  . rosuvastatin (CRESTOR) 20 MG tablet Take 1 tablet (20 mg total) by mouth daily. 90 tablet 3  . Specialty Vitamins Products (CENTRUM SPECIALIST ENERGY) TABS Take 1 tablet by mouth daily with breakfast.    . Testosterone (ANDROGEL) 40.5 MG/2.5GM (1.62%) GEL Place 1 application onto the skin See admin instructions. Apply 1 pump to each shoulder once a day    . TURMERIC PO Take 1 capsule by mouth daily with breakfast.     . vitamin E 400 UNIT capsule Take 400 Units by mouth at bedtime.     Marland Kitchen zonisamide (ZONEGRAN) 25 MG capsule Take 2 capsules (50 mg total) by mouth daily. 180 capsule 3  . losartan (COZAAR) 25 MG tablet Take 1 tablet (25 mg total) by mouth daily. 90 tablet 3   No current facility-administered medications for this visit.    Allergies  Allergen Reactions  .  Mushroom Extract Complex Nausea Only and Other (See Comments)    Severe vertigo and headaches     Review of Systems: All systems reviewed and negative except where noted in HPI.   Lab Results  Component Value Date   WBC 8.8 08/25/2018   HGB 12.0 (L) 08/25/2018   HCT 35.6 (L) 08/25/2018   MCV 102.6 (H) 08/25/2018   PLT 233 08/25/2018    Lab Results  Component Value Date   CREATININE 1.38 (H)  09/03/2018   BUN 22 09/03/2018   NA 142 09/03/2018   K 4.6 09/03/2018   CL 102 09/03/2018   CO2 23 09/03/2018    Physical Exam: BP 108/60 (BP Location: Left Arm, Patient Position: Sitting, Cuff Size: Normal)   Pulse 76   Ht 5' 5.25" (1.657 m) Comment: height measured without shoes  Wt 156 lb 4 oz (70.9 kg)   BMI 25.80 kg/m  Constitutional: Pleasant,well-developed, male in no acute distress. HEENT: Normocephalic and atraumatic. Conjunctivae are normal. No scleral icterus. Neck supple.  Cardiovascular: Normal rate, regular rhythm.  Pulmonary/chest: Effort normal and breath sounds normal. No wheezing, rales or rhonchi. Abdominal: Soft, nondistended, nontender. . There are no masses palpable. No hepatomegaly. Extremities: no edema Lymphadenopathy: No cervical adenopathy noted. Neurological: Alert and oriented to person place and time. Skin: Skin is warm and dry. No rashes noted. Psychiatric: Normal mood and affect. Behavior is normal.   ASSESSMENT AND PLAN: 73 year old male here for new patient consultation regarding the following:  Rectal bleeding / heme positive stool - suspect these are both due to internal hemorrhoids based on his description of symptoms over the years. That being said his last colonoscopy was in 2011 and I am recommending optical colonoscopy in light of some of his symptoms recently along with his pain. The question is the timing of this exam. These symptoms are stable and chronic. It may be best for him to complete his cardiac rehabilitation first and then  proceed with colonoscopy once recovered from his NSTEMI. I will touch base with his cardiologist about this plan. Otherwise once cleared we will schedule this exam. He agreed.  Lower abdominal pain - onset with weight gain, improvement with weight loss and not bothering him much. We'll await colonoscopy as above, if worsening in the interim he will contact me  Anemia - macrocytic anemia noted, no prior workup, will send labs to further workup. He denies alcohol use.   Butterfield Cellar, MD [facility]  CC: Burman Freestone, MD

## 2018-09-21 NOTE — Patient Instructions (Signed)
If you are age 71 or older, your body mass index should be between 23-30. Your Body mass index is 25.8 kg/m. If this is out of the aforementioned range listed, please consider follow up with your Primary Care Provider.  If you are age 54 or younger, your body mass index should be between 19-25. Your Body mass index is 25.8 kg/m. If this is out of the aformentioned range listed, please consider follow up with your Primary Care Provider.   Please go to the lab in the basement of our building to have lab work done as you leave today. Hit "B" for basement when you get on the elevator.  When the doors open the lab is on your left.  We will call you with the results. Thank you.  Thank you for entrusting me with your care and for choosing [facility], Dr. [place] Cellar

## 2018-09-22 ENCOUNTER — Other Ambulatory Visit: Payer: Self-pay

## 2018-09-22 DIAGNOSIS — D649 Anemia, unspecified: Secondary | ICD-10-CM

## 2018-09-22 DIAGNOSIS — R1030 Lower abdominal pain, unspecified: Secondary | ICD-10-CM

## 2018-09-22 DIAGNOSIS — D7589 Other specified diseases of blood and blood-forming organs: Secondary | ICD-10-CM

## 2018-09-22 DIAGNOSIS — K625 Hemorrhage of anus and rectum: Secondary | ICD-10-CM

## 2018-09-22 DIAGNOSIS — D539 Nutritional anemia, unspecified: Secondary | ICD-10-CM

## 2018-09-22 NOTE — Progress Notes (Signed)
Added orders for b12 and folate labs

## 2018-09-23 NOTE — Progress Notes (Signed)
[name] 73 y.o. male DOB 09/30/1945 MRN [id number]       Nutrition Screen Note  No diagnosis found. Past Medical History:  Diagnosis Date  . Anal fissure   . Arthritis   . Bicuspid aortic valve    with mild to moderate AR by echo 10/2017  . BPH (benign prostatic hyperplasia)   . CAD (coronary artery disease)    s/p CABG [facility] [place]  . Depression   . Dilated aortic root (HCC)    92mm aortic root and 25mm ascending aorta by echo 10/2017  . History of blood transfusion 1998   "due to cardiac bypass surgery"  . Hyperlipidemia   . Hypertension   . Hypogonadism in male   . Kidney stones   . Migraine   . Retinal hemorrhage 11/04/2014  . Sleep apnea with use of continuous positive airway pressure (CPAP)    Meds reviewed.    Current Outpatient Medications (Endocrine & Metabolic):  Marland Kitchen  Testosterone (ANDROGEL) 40.5 MG/2.5GM (1.62%) GEL, Place 1 application onto the skin See admin instructions. Apply 1 pump to each shoulder once a day  Current Outpatient Medications (Cardiovascular):  .  amLODipine (NORVASC) 10 MG tablet, Take 10 mg by mouth daily. Marland Kitchen  losartan (COZAAR) 25 MG tablet, Take 1 tablet (25 mg total) by mouth daily. .  metoprolol succinate (TOPROL-XL) 100 MG 24 hr tablet, Take 100 mg by mouth at bedtime. Take with or immediately following a meal. .  nitroGLYCERIN (NITROSTAT) 0.4 MG SL tablet, Place 1 tablet (0.4 mg total) under the tongue every 5 (five) minutes x 3 doses as needed for chest pain. .  rosuvastatin (CRESTOR) 20 MG tablet, Take 1 tablet (20 mg total) by mouth daily.   Current Outpatient Medications (Analgesics):  .  aspirin EC 81 MG tablet, Take 81 mg by mouth daily. Marland Kitchen  aspirin-acetaminophen-caffeine (EXCEDRIN EXTRA STRENGTH) 250-250-65 MG tablet, Take 2 tablets by mouth every 6 (six) hours as needed for headache. .  butalbital-aspirin-caffeine-codeine (FIORINAL WITH CODEINE) 50-325-40-30 MG capsule, TAKE ONE CAPSULE BY MOUTH EVERY 4 HOURS AS NEEDED  FOR PAIN (Patient taking differently: Take 2 capsules by mouth every 4 (four) hours as needed for migraine. ) .  ibuprofen (ADVIL,MOTRIN) 200 MG tablet, Take 200-400 mg by mouth daily as needed (for arthritis in the feet).   Current Outpatient Medications (Other):  Marland Kitchen  ALPRAZolam (XANAX) 1 MG tablet, Take 1 mg by mouth at bedtime as needed for anxiety. .  Ascorbic Acid (VITAMIN C) 1000 MG tablet, Take 1,000 mg by mouth 2 (two) times daily. .  diazepam (VALIUM) 5 MG tablet, Take 2.5-5 mg by mouth daily as needed for anxiety. .  niacin 500 MG tablet, Take 500 mg by mouth at bedtime. Marland Kitchen  Specialty Vitamins Products (CENTRUM SPECIALIST ENERGY) TABS, Take 1 tablet by mouth daily with breakfast. .  TURMERIC PO, Take 1 capsule by mouth daily with breakfast.  .  vitamin E 400 UNIT capsule, Take 400 Units by mouth at bedtime.  Marland Kitchen  zonisamide (ZONEGRAN) 25 MG capsule, Take 2 capsules (50 mg total) by mouth daily.   HT: Ht Readings from Last 1 Encounters:  09/21/18 5' 5.25" (1.657 m)    WT: Wt Readings from Last 5 Encounters:  09/21/18 156 lb 4 oz (70.9 kg)  09/03/18 154 lb 1.9 oz (69.9 kg)  08/25/18 156 lb 1.6 oz (70.8 kg)  04/20/18 157 lb 6.4 oz (71.4 kg)  10/14/17 160 lb (72.6 kg)     BMI =  25.81   Current tobacco use? No       Labs:  Lipid Panel     Component Value Date/Time   CHOL 87 08/22/2018 0645        TRIG 58 08/22/2018 0645   HDL 31 (L) 08/22/2018 0645        CHOLHDL 2.8 08/22/2018 0645   VLDL 12 08/22/2018 0645   LDLCALC 44 08/22/2018 0645         Lab Results  Component Value Date   HGBA1C 5.9 (H) 08/22/2018   CBG (last 3)  No results for input(s): GLUCAP in the last 72 hours.  Nutrition Diagnosis ? Food-and nutrition-related knowledge deficit related to lack of exposure to information as related to diagnosis of: ? CVD   Nutrition Goal(s):  ? To be determined  Plan:  Pt to attend nutrition classes ? Nutrition I ? Nutrition II ? Portion Distortion  Will  provide client-centered nutrition education as part of interdisciplinary care.   Monitor and evaluate progress toward nutrition goal with team.  Laurina Bustle, MS, RD, LDN 09/23/2018 10:00 AM

## 2018-09-24 ENCOUNTER — Other Ambulatory Visit (INDEPENDENT_AMBULATORY_CARE_PROVIDER_SITE_OTHER): Payer: Medicare Other

## 2018-09-24 DIAGNOSIS — D649 Anemia, unspecified: Secondary | ICD-10-CM

## 2018-09-24 DIAGNOSIS — D7589 Other specified diseases of blood and blood-forming organs: Secondary | ICD-10-CM

## 2018-09-24 LAB — FOLATE: Folate: >24 ng/mL (ref >5.9)

## 2018-09-24 LAB — VITAMIN B12: VITAMIN B 12: 721 pg/mL (ref 211–911)

## 2018-09-29 ENCOUNTER — Encounter (HOSPITAL_COMMUNITY)
Admission: RE | Admit: 2018-09-29 | Discharge: 2018-09-29 | Disposition: A | Discharge status: Home / Self Care | Payer: Medicare Other | Source: Ambulatory Visit | Attending: Cardiology | Admitting: Cardiology

## 2018-09-29 VITALS — Ht 66.5 in | Wt 152.6 lb

## 2018-09-29 DIAGNOSIS — I214 Non-ST elevation (NSTEMI) myocardial infarction: Secondary | ICD-10-CM | POA: Diagnosis not present

## 2018-09-29 NOTE — Progress Notes (Signed)
Cardiac Rehab Medication Review by a Nurse  Does the patient  feel that his/her medications are working for him/her?  yes  Has the patient been experiencing any side effects to the medications prescribed?  no  Does the patient measure his/her own blood pressure or blood glucose at home?  no   Does the patient have any problems obtaining medications due to transportation or finances?   no  Understanding of regimen: good Understanding of indications: good Potential of compliance: good    Nurse comments: Andreas is compliant with his medications and has a good understanding of what they are for.    Harrell Gave RN BSN 09/29/2018 8:35 AM

## 2018-09-29 NOTE — Telephone Encounter (Signed)
Pt came in for orientation on 09/29/2018 and wanted to change his exc. Time to 8:15. Changed pt exc time to 8:15 and placed him on overbook monitor.  Tedra Senegal. Support Rep II

## 2018-09-29 NOTE — Progress Notes (Signed)
Cardiac Individual Treatment Plan  Patient Details  Name: [name] MRN: [id number] Date of Birth: 06/15/1946 Referring Provider:     CARDIAC REHAB PHASE II ORIENTATION from 09/29/2018 in [facility]  Referring Provider  Dr. Radford Pax      Initial Encounter Date:    CARDIAC REHAB PHASE II ORIENTATION from 09/29/2018 in [facility]  Date  09/29/18      Visit Diagnosis: 08/21/2018 NSTEMI  Patient's Home Medications on Admission:  Current Outpatient Medications:  .  ALPRAZolam (XANAX) 1 MG tablet, Take 1 mg by mouth at bedtime as needed for anxiety., Disp: , Rfl:  .  amLODipine (NORVASC) 10 MG tablet, Take 10 mg by mouth daily., Disp: , Rfl:  .  Ascorbic Acid (VITAMIN C) 1000 MG tablet, Take 1,000 mg by mouth 2 (two) times daily., Disp: , Rfl:  .  aspirin EC 81 MG tablet, Take 81 mg by mouth daily., Disp: , Rfl:  .  aspirin-acetaminophen-caffeine (EXCEDRIN EXTRA STRENGTH) 250-250-65 MG tablet, Take 2 tablets by mouth every 6 (six) hours as needed for headache., Disp: , Rfl:  .  butalbital-aspirin-caffeine-codeine (FIORINAL WITH CODEINE) 50-325-40-30 MG capsule, TAKE ONE CAPSULE BY MOUTH EVERY 4 HOURS AS NEEDED FOR PAIN (Patient taking differently: Take 2 capsules by mouth every 4 (four) hours as needed for migraine. ), Disp: 15 capsule, Rfl: 0 .  diazepam (VALIUM) 5 MG tablet, Take 2.5-5 mg by mouth daily as needed for anxiety., Disp: , Rfl:  .  ibuprofen (ADVIL,MOTRIN) 200 MG tablet, Take 200-400 mg by mouth daily as needed (for arthritis in the feet)., Disp: , Rfl:  .  losartan (COZAAR) 25 MG tablet, Take 1 tablet (25 mg total) by mouth daily., Disp: 90 tablet, Rfl: 3 .  metoprolol succinate (TOPROL-XL) 100 MG 24 hr tablet, Take 100 mg by mouth at bedtime. Take with or immediately following a meal., Disp: , Rfl:  .  niacin 500 MG tablet, Take 500 mg by mouth at bedtime., Disp: , Rfl:  .  nitroGLYCERIN (NITROSTAT) 0.4 MG SL  tablet, Place 1 tablet (0.4 mg total) under the tongue every 5 (five) minutes x 3 doses as needed for chest pain., Disp: 25 tablet, Rfl: 12 .  rosuvastatin (CRESTOR) 20 MG tablet, Take 1 tablet (20 mg total) by mouth daily., Disp: 90 tablet, Rfl: 3 .  Specialty Vitamins Products (CENTRUM SPECIALIST ENERGY) TABS, Take 1 tablet by mouth daily with breakfast., Disp: , Rfl:  .  Testosterone (ANDROGEL) 40.5 MG/2.5GM (1.62%) GEL, Place 1 application onto the skin See admin instructions. Apply 1 pump to each shoulder once a day, Disp: , Rfl:  .  TURMERIC PO, Take 1 capsule by mouth daily with breakfast. , Disp: , Rfl:  .  vitamin E 400 UNIT capsule, Take 400 Units by mouth at bedtime. , Disp: , Rfl:  .  zonisamide (ZONEGRAN) 25 MG capsule, Take 2 capsules (50 mg total) by mouth daily., Disp: 180 capsule, Rfl: 3  Past Medical History: Past Medical History:  Diagnosis Date  . Anal fissure   . Arthritis   . Bicuspid aortic valve    with mild to moderate AR by echo 10/2017  . BPH (benign prostatic hyperplasia)   . CAD (coronary artery disease)    s/p CABG [facility] [place]  . Depression   . Dilated aortic root (HCC)    52mm aortic root and 31mm ascending aorta by echo 10/2017  . History of blood transfusion  1998   "due to cardiac bypass surgery"  . Hyperlipidemia   . Hypertension   . Hypogonadism in male   . Kidney stones   . Migraine   . Retinal hemorrhage 11/04/2014  . Sleep apnea with use of continuous positive airway pressure (CPAP)     Tobacco Use: Social History   Tobacco Use  Smoking Status Former Smoker  . Years: 20.00  . Last attempt to quit: 07/22/1986  . Years since quitting: 32.2  Smokeless Tobacco Never Used    Labs: Recent Chemical engineer    Labs for ITP Cardiac and Pulmonary Rehab Latest Ref Rng & Units 10/05/2014 10/17/2017 12/08/2017 08/22/2018   Cholestrol 0 - 200 mg/dL 133 151 116 87   LDLCALC 0 - 99 mg/dL 63 79 55 44   HDL >40 mg/dL 43.30 44 37(L) 31(L)    Trlycerides <150 mg/dL 135.0 140 122 58   Hemoglobin A1c 4.8 - 5.6 % 5.9 - - 5.9(H)      Capillary Blood Glucose: No results found for: GLUCAP   Exercise Target Goals: Exercise Program Goal: Individual exercise prescription set using results from initial 6 min walk test and THRR while considering  patient's activity barriers and safety.   Exercise Prescription Goal: Initial exercise prescription builds to 30-45 minutes a day of aerobic activity, 2-3 days per week.  Home exercise guidelines will be given to patient during program as part of exercise prescription that the participant will acknowledge.  Activity Barriers & Risk Stratification: Activity Barriers & Cardiac Risk Stratification - 09/29/18 1009      Activity Barriers & Cardiac Risk Stratification   Activity Barriers  Arthritis   Arthritis is both feet   Cardiac Risk Stratification  High       6 Minute Walk: 6 Minute Walk    Row Name 09/29/18 1008         6 Minute Walk   Phase  Initial     Distance  1964 feet     Walk Time  6 minutes     # of Rest Breaks  0     MPH  3.72     METS  3.99     RPE  12     Perceived Dyspnea   0     VO2 Peak  13.97     Symptoms  No     Resting HR  71 bpm     Resting BP  102/62     Resting Oxygen Saturation   100 %     Exercise Oxygen Saturation  during 6 min walk  100 %     Max Ex. HR  81 bpm     Max Ex. BP  122/62     2 Minute Post BP  112/58        Oxygen Initial Assessment:   Oxygen Re-Evaluation:   Oxygen Discharge (Final Oxygen Re-Evaluation):   Initial Exercise Prescription: Initial Exercise Prescription - 09/29/18 1000      Date of Initial Exercise RX and Referring Provider   Date  09/29/18    Referring Provider  Dr. Radford Pax    Expected Discharge Date  01/04/19      Treadmill   MPH  3    Grade  1    Minutes  10      Bike   Level  1    Minutes  10    METs  3.7      NuStep   Level  3  SPM  85    Minutes  10    METs  3.8      Prescription  Details   Frequency (times per week)  3    Duration  Progress to 30 minutes of continuous aerobic without signs/symptoms of physical distress      Intensity   THRR 40-80% of Max Heartrate  60-120    Ratings of Perceived Exertion  11-13      Progression   Progression  Continue to progress workloads to maintain intensity without signs/symptoms of physical distress.      Resistance Training   Training Prescription  Yes    Weight  3 lbs.     Reps  10-15       Perform Capillary Blood Glucose checks as needed.  Exercise Prescription Changes:   Exercise Comments:   Exercise Goals and Review: Exercise Goals    Row Name 09/29/18 1010             Exercise Goals   Increase Physical Activity  Yes       Intervention  Provide advice, education, support and counseling about physical activity/exercise needs.;Develop an individualized exercise prescription for aerobic and resistive training based on initial evaluation findings, risk stratification, comorbidities and participant's personal goals.       Expected Outcomes  Short Term: Attend rehab on a regular basis to increase amount of physical activity.       Increase Strength and Stamina  Yes       Intervention  Provide advice, education, support and counseling about physical activity/exercise needs.;Develop an individualized exercise prescription for aerobic and resistive training based on initial evaluation findings, risk stratification, comorbidities and participant's personal goals.       Expected Outcomes  Short Term: Increase workloads from initial exercise prescription for resistance, speed, and METs.       Able to understand and use rate of perceived exertion (RPE) scale  Yes       Intervention  Provide education and explanation on how to use RPE scale       Expected Outcomes  Short Term: Able to use RPE daily in rehab to express subjective intensity level;Long Term:  Able to use RPE to guide intensity level when exercising  independently       Knowledge and understanding of Target Heart Rate Range (THRR)  Yes       Intervention  Provide education and explanation of THRR including how the numbers were predicted and where they are located for reference       Expected Outcomes  Short Term: Able to state/look up THRR;Long Term: Able to use THRR to govern intensity when exercising independently;Short Term: Able to use daily as guideline for intensity in rehab       Able to check pulse independently  Yes       Intervention  Provide education and demonstration on how to check pulse in carotid and radial arteries.;Review the importance of being able to check your own pulse for safety during independent exercise       Expected Outcomes  Short Term: Able to explain why pulse checking is important during independent exercise;Long Term: Able to check pulse independently and accurately       Understanding of Exercise Prescription  Yes       Intervention  Provide education, explanation, and written materials on patient's individual exercise prescription       Expected Outcomes  Short Term: Able to explain program exercise prescription;Long Term: Able to  explain home exercise prescription to exercise independently          Exercise Goals Re-Evaluation :   Discharge Exercise Prescription (Final Exercise Prescription Changes):   Nutrition:  Target Goals: Understanding of nutrition guidelines, daily intake of sodium 1500mg , cholesterol 200mg , calories 30% from fat and 7% or less from saturated fats, daily to have 5 or more servings of fruits and vegetables.  Biometrics: Pre Biometrics - 09/29/18 1010      Pre Biometrics   Height  5' 6.5" (1.689 m)    Weight  69.2 kg    Waist Circumference  33 inches    Hip Circumference  36 inches    Waist to Hip Ratio  0.92 %    BMI (Calculated)  24.26    Triceps Skinfold  25 mm    % Body Fat  25.3 %    Grip Strength  34 kg    Flexibility  7 in    Single Leg Stand  27.47 seconds         Nutrition Therapy Plan and Nutrition Goals:   Nutrition Assessments:   Nutrition Goals Re-Evaluation:   Nutrition Goals Re-Evaluation:   Nutrition Goals Discharge (Final Nutrition Goals Re-Evaluation):   Psychosocial: Target Goals: Acknowledge presence or absence of significant depression and/or stress, maximize coping skills, provide positive support system. Participant is able to verbalize types and ability to use techniques and skills needed for reducing stress and depression.  Initial Review & Psychosocial Screening: Initial Psych Review & Screening - 09/29/18 1403      Initial Review   Current issues with  Current Anxiety/Panic   pt rates his stress and anxiety level is "low". Pt has valium as prn med and Xanax prn bedtime     Family Dynamics   Good Support System?  No    Comments  Pt has girlfreind who lives in [place]; Family lives in [place] and [place].  Pt would like improvement in his support system.  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      Screening Interventions   Expected Outcomes  Long Term goal: The participant improves quality of Life and PHQ9 Scores as seen by post scores and/or verbalization of changes;Short Term goal: Identification and review with participant of any Quality of Life or Depression concerns found by scoring the questionnaire.       Quality of Life Scores: Quality of Life - 09/29/18 1011      Quality of Life   Select  Quality of Life      Quality of Life Scores   Health/Function Pre  24.8 %    Socioeconomic Pre  20 %    Psych/Spiritual Pre  24 %    Family Pre  25.5 %    GLOBAL Pre  23.7 %      Scores of 19 and below usually indicate a poorer quality of life in these areas.  A difference of  2-3 points is a clinically meaningful difference.  A difference of 2-3  points in the total score of the Quality of Life Index has been associated with significant improvement in overall quality of life, self-image, physical symptoms, and general health in studies assessing change in quality of life.  PHQ-9: Recent Review Flowsheet Data    There is no flowsheet data to display.  Interpretation of Total Score  Total Score Depression Severity:  1-4 = Minimal depression, 5-9 = Mild depression, 10-14 = Moderate depression, 15-19 = Moderately severe depression, 20-27 = Severe depression   Psychosocial Evaluation and Intervention:   Psychosocial Re-Evaluation:   Psychosocial Discharge (Final Psychosocial Re-Evaluation):   Vocational Rehabilitation: Provide vocational rehab assistance to qualifying candidates.   Vocational Rehab Evaluation & Intervention: Vocational Rehab - 09/29/18 1412      Initial Vocational Rehab Evaluation & Intervention   Assessment shows need for Vocational Rehabilitation  No   pt is retired      Education: Education Goals: Education classes will be provided on a weekly basis, covering required topics. Participant will state understanding/return demonstration of topics presented.  Learning Barriers/Preferences: Learning Barriers/Preferences - 09/29/18 1012      Learning Barriers/Preferences   Learning Barriers  None    Learning Preferences  Audio;Written Material       Education Topics: Count Your Pulse:  -Group instruction provided by verbal instruction, demonstration, patient participation and written materials to support subject.  Instructors address importance of being able to find your pulse and how to count your pulse when at home without a heart monitor.  Patients get hands on experience counting their pulse with staff help and individually.   Heart Attack, Angina, and Risk Factor Modification:  -Group instruction provided by verbal instruction, video, and written materials to support subject.  Instructors  address signs and symptoms of angina and heart attacks.    Also discuss risk factors for heart disease and how to make changes to improve heart health risk factors.   Functional Fitness:  -Group instruction provided by verbal instruction, demonstration, patient participation, and written materials to support subject.  Instructors address safety measures for doing things around the house.  Discuss how to get up and down off the floor, how to pick things up properly, how to safely get out of a chair without assistance, and balance training.   Meditation and Mindfulness:  -Group instruction provided by verbal instruction, patient participation, and written materials to support subject.  Instructor addresses importance of mindfulness and meditation practice to help reduce stress and improve awareness.  Instructor also leads participants through a meditation exercise.    Stretching for Flexibility and Mobility:  -Group instruction provided by verbal instruction, patient participation, and written materials to support subject.  Instructors lead participants through series of stretches that are designed to increase flexibility thus improving mobility.  These stretches are additional exercise for major muscle groups that are typically performed during regular warm up and cool down.   Hands Only CPR:  -Group verbal, video, and participation provides a basic overview of AHA guidelines for community CPR. Role-play of emergencies allow participants the opportunity to practice calling for help and chest compression technique with discussion of AED use.   Hypertension: -Group verbal and written instruction that provides a basic overview of hypertension including the most recent diagnostic guidelines, risk factor reduction with self-care instructions and medication management.    Nutrition I class: Heart Healthy Eating:  -Group instruction provided by PowerPoint slides, verbal discussion, and written  materials to support subject matter. The instructor gives an explanation and review of the Therapeutic Lifestyle Changes diet recommendations, which includes a discussion on lipid goals, dietary fat, sodium, fiber, plant stanol/sterol esters, sugar, and the components of a well-balanced, healthy diet.   Nutrition II class: Lifestyle Skills:  -Group instruction provided by PowerPoint slides, verbal discussion, and written materials to support subject matter.  The instructor gives an explanation and review of label reading, grocery shopping for heart health, heart healthy recipe modifications, and ways to make healthier choices when eating out.   Diabetes Question & Answer:  -Group instruction provided by PowerPoint slides, verbal discussion, and written materials to support subject matter. The instructor gives an explanation and review of diabetes co-morbidities, pre- and post-prandial blood glucose goals, pre-exercise blood glucose goals, signs, symptoms, and treatment of hypoglycemia and hyperglycemia, and foot care basics.   Diabetes Blitz:  -Group instruction provided by PowerPoint slides, verbal discussion, and written materials to support subject matter. The instructor gives an explanation and review of the physiology behind type 1 and type 2 diabetes, diabetes medications and rational behind using different medications, pre- and post-prandial blood glucose recommendations and Hemoglobin A1c goals, diabetes diet, and exercise including blood glucose guidelines for exercising safely.    Portion Distortion:  -Group instruction provided by PowerPoint slides, verbal discussion, written materials, and food models to support subject matter. The instructor gives an explanation of serving size versus portion size, changes in portions sizes over the last 20 years, and what consists of a serving from each food group.   Stress Management:  -Group instruction provided by verbal instruction, video, and  written materials to support subject matter.  Instructors review role of stress in heart disease and how to cope with stress positively.     Exercising on Your Own:  -Group instruction provided by verbal instruction, power point, and written materials to support subject.  Instructors discuss benefits of exercise, components of exercise, frequency and intensity of exercise, and end points for exercise.  Also discuss use of nitroglycerin and activating EMS.  Review options of places to exercise outside of rehab.  Review guidelines for sex with heart disease.   Cardiac Drugs I:  -Group instruction provided by verbal instruction and written materials to support subject.  Instructor reviews cardiac drug classes: antiplatelets, anticoagulants, beta blockers, and statins.  Instructor discusses reasons, side effects, and lifestyle considerations for each drug class.   Cardiac Drugs II:  -Group instruction provided by verbal instruction and written materials to support subject.  Instructor reviews cardiac drug classes: angiotensin converting enzyme inhibitors (ACE-I), angiotensin II receptor blockers (ARBs), nitrates, and calcium channel blockers.  Instructor discusses reasons, side effects, and lifestyle considerations for each drug class.   Anatomy and Physiology of the Circulatory System:  Group verbal and written instruction and models provide basic cardiac anatomy and physiology, with the coronary electrical and arterial systems. Review of: AMI, Angina, Valve disease, Heart Failure, Peripheral Artery Disease, Cardiac Arrhythmia, Pacemakers, and the ICD.   Other Education:  -Group or individual verbal, written, or video instructions that support the educational goals of the cardiac rehab program.   Holiday Eating Survival Tips:  -Group instruction provided by PowerPoint slides, verbal discussion, and written materials to support subject matter. The instructor gives patients tips, tricks, and  techniques to help them not only survive but enjoy the holidays despite the onslaught of food that accompanies the holidays.   Knowledge Questionnaire Score: Knowledge Questionnaire Score - 09/29/18 1011      Knowledge Questionnaire Score   Pre Score  23/24       Core Components/Risk Factors/Patient Goals at Admission: Personal Goals and Risk Factors at Admission - 09/29/18 1012      Core Components/Risk Factors/Patient Goals on Admission    Weight Management  Yes;Weight Maintenance    Intervention  Weight Management: Develop a combined nutrition and exercise  program designed to reach desired caloric intake, while maintaining appropriate intake of nutrient and fiber, sodium and fats, and appropriate energy expenditure required for the weight goal.;Weight Management: Provide education and appropriate resources to help participant work on and attain dietary goals.    Admit Weight  152 lb 8.9 oz (69.2 kg)    Expected Outcomes  Short Term: Continue to assess and modify interventions until short term weight is achieved;Long Term: Adherence to nutrition and physical activity/exercise program aimed toward attainment of established weight goal;Weight Maintenance: Understanding of the daily nutrition guidelines, which includes 25-35% calories from fat, 7% or less cal from saturated fats, less than 200mg  cholesterol, less than 1.5gm of sodium, & 5 or more servings of fruits and vegetables daily;Understanding recommendations for meals to include 15-35% energy as protein, 25-35% energy from fat, 35-60% energy from carbohydrates, less than 200mg  of dietary cholesterol, 20-35 gm of total fiber daily;Understanding of distribution of calorie intake throughout the day with the consumption of 4-5 meals/snacks    Hypertension  Yes    Intervention  Provide education on lifestyle modifcations including regular physical activity/exercise, weight management, moderate sodium restriction and increased consumption of  fresh fruit, vegetables, and low fat dairy, alcohol moderation, and smoking cessation.;Monitor prescription use compliance.    Expected Outcomes  Short Term: Continued assessment and intervention until BP is < 140/82mm HG in hypertensive participants. < 130/90mm HG in hypertensive participants with diabetes, heart failure or chronic kidney disease.;Long Term: Maintenance of blood pressure at goal levels.    Lipids  Yes    Intervention  Provide education and support for participant on nutrition & aerobic/resistive exercise along with prescribed medications to achieve LDL 70mg , HDL >40mg .    Expected Outcomes  Short Term: Participant states understanding of desired cholesterol values and is compliant with medications prescribed. Participant is following exercise prescription and nutrition guidelines.;Long Term: Cholesterol controlled with medications as prescribed, with individualized exercise RX and with personalized nutrition plan. Value goals: LDL < 70mg , HDL > 40 mg.    Stress  Yes    Intervention  Offer individual and/or small group education and counseling on adjustment to heart disease, stress management and health-related lifestyle change. Teach and support self-help strategies.;Refer participants experiencing significant psychosocial distress to appropriate mental health specialists for further evaluation and treatment. When possible, include family members and significant others in education/counseling sessions.    Expected Outcomes  Short Term: Participant demonstrates changes in health-related behavior, relaxation and other stress management skills, ability to obtain effective social support, and compliance with psychotropic medications if prescribed.;Long Term: Emotional wellbeing is indicated by absence of clinically significant psychosocial distress or social isolation.       Core Components/Risk Factors/Patient Goals Review:    Core Components/Risk Factors/Patient Goals at Discharge  (Final Review):    ITP Comments: ITP Comments    Row Name 09/29/18 0746           ITP Comments  Medical Director- Dr. Fransico Him, MD          Comments:  Patient attended orientation from [place]  to 0915  to review rules and guidelines for program. Completed 6 minute walk test, Intitial ITP, and exercise prescription.  VSS. Telemetry- SR with occ PVC noted on exertion.  Pt asymptomatic and is looking forward to learning more about education and nutrition. Cherre Huger, BSN Cardiac and Training and development officer

## 2018-09-30 ENCOUNTER — Other Ambulatory Visit: Payer: Self-pay | Admitting: Cardiology

## 2018-10-05 ENCOUNTER — Ambulatory Visit (HOSPITAL_COMMUNITY): Payer: Medicare Other

## 2018-10-05 ENCOUNTER — Telehealth (HOSPITAL_COMMUNITY): Payer: Self-pay

## 2018-10-06 ENCOUNTER — Ambulatory Visit (HOSPITAL_COMMUNITY): Payer: Medicare Other

## 2018-10-06 ENCOUNTER — Telehealth (HOSPITAL_COMMUNITY): Payer: Self-pay

## 2018-10-06 NOTE — Telephone Encounter (Signed)
Contacted pt via phone to obtain [employer] survey.

## 2018-10-07 ENCOUNTER — Ambulatory Visit (HOSPITAL_COMMUNITY): Payer: Medicare Other

## 2018-10-07 NOTE — Progress Notes (Signed)
[name] 73 y.o. male DOB 27-Oct-1945 MRN [id number]       Nutrition Screen Note  1. 08/21/2018 NSTEMI    Past Medical History:  Diagnosis Date  . Anal fissure   . Arthritis   . Bicuspid aortic valve    with mild to moderate AR by echo 10/2017  . BPH (benign prostatic hyperplasia)   . CAD (coronary artery disease)    s/p CABG [facility]  . Depression   . Dilated aortic root (HCC)    45mm aortic root and 74mm ascending aorta by echo 10/2017  . History of blood transfusion 1998   "due to cardiac bypass surgery"  . Hyperlipidemia   . Hypertension   . Hypogonadism in male   . Kidney stones   . Migraine   . Retinal hemorrhage 11/04/2014  . Sleep apnea with use of continuous positive airway pressure (CPAP)    Meds reviewed.    Current Outpatient Medications (Endocrine & Metabolic):  Marland Kitchen  Testosterone (ANDROGEL) 40.5 MG/2.5GM (1.62%) GEL, Place 1 application onto the skin See admin instructions. Apply 1 pump to each shoulder once a day  Current Outpatient Medications (Cardiovascular):  .  losartan (COZAAR) 25 MG tablet, Take 1 tablet (25 mg total) by mouth daily. .  nitroGLYCERIN (NITROSTAT) 0.4 MG SL tablet, Place 1 tablet (0.4 mg total) under the tongue every 5 (five) minutes x 3 doses as needed for chest pain. .  rosuvastatin (CRESTOR) 20 MG tablet, Take 1 tablet (20 mg total) by mouth daily. Marland Kitchen  amLODipine (NORVASC) 10 MG tablet, TAKE ONE TABLET BY MOUTH DAILY .  metoprolol succinate (TOPROL-XL) 100 MG 24 hr tablet, TAKE ONE TABLET BY MOUTH DAILY   Current Outpatient Medications (Analgesics):  .  aspirin EC 81 MG tablet, Take 81 mg by mouth daily. Marland Kitchen  aspirin-acetaminophen-caffeine (EXCEDRIN EXTRA STRENGTH) 250-250-65 MG tablet, Take 2 tablets by mouth every 6 (six) hours as needed for headache. .  butalbital-aspirin-caffeine-codeine (FIORINAL WITH CODEINE) 50-325-40-30 MG capsule, TAKE ONE CAPSULE BY MOUTH EVERY 4 HOURS AS NEEDED FOR PAIN (Patient taking differently:  Take 2 capsules by mouth every 4 (four) hours as needed for migraine. ) .  ibuprofen (ADVIL,MOTRIN) 200 MG tablet, Take 200-400 mg by mouth daily as needed (for arthritis in the feet).   Current Outpatient Medications (Other):  Marland Kitchen  ALPRAZolam (XANAX) 1 MG tablet, Take 1 mg by mouth at bedtime as needed for anxiety. .  Ascorbic Acid (VITAMIN C) 1000 MG tablet, Take 1,000 mg by mouth 2 (two) times daily. .  diazepam (VALIUM) 5 MG tablet, Take 2.5-5 mg by mouth daily as needed for anxiety. .  niacin 500 MG tablet, Take 500 mg by mouth at bedtime. Marland Kitchen  Specialty Vitamins Products (CENTRUM SPECIALIST ENERGY) TABS, Take 1 tablet by mouth daily with breakfast. .  TURMERIC PO, Take 1 capsule by mouth daily with breakfast.  .  vitamin E 400 UNIT capsule, Take 400 Units by mouth at bedtime.  Marland Kitchen  zonisamide (ZONEGRAN) 25 MG capsule, Take 2 capsules (50 mg total) by mouth daily.   HT: Ht Readings from Last 1 Encounters:  09/29/18 5' 6.5" (1.689 m)    WT: Wt Readings from Last 5 Encounters:  09/29/18 152 lb 8.9 oz (69.2 kg)  09/21/18 156 lb 4 oz (70.9 kg)  09/03/18 154 lb 1.9 oz (69.9 kg)  08/25/18 156 lb 1.6 oz (70.8 kg)  04/20/18 157 lb 6.4 oz (71.4 kg)     BMI = 25.81  Current tobacco use? No       Labs:  Lipid Panel     Component Value Date/Time   CHOL 87 08/22/2018 0645        TRIG 58 08/22/2018 0645   HDL 31 (L) 08/22/2018 0645        CHOLHDL 2.8 08/22/2018 0645   VLDL 12 08/22/2018 0645   LDLCALC 44 08/22/2018 0645         Lab Results  Component Value Date   HGBA1C 5.9 (H) 08/22/2018   CBG (last 3)  No results for input(s): GLUCAP in the last 72 hours.  Nutrition Note Spoke with pt over the phone. Nutrition plan and goals reviewed with pt. Pt is not following a heart healthy diet. Pt shared that his diet " leaves much to be desired.Marland KitchenMarland KitchenI'm a classic man, meat and potatoes... not sure I'm ready to give up bacon or dessert right now". Thanked patient for his honesty,  discussed small changes that could have large impacts on his overall health. Heart healthy eating tips reviewed (label reading, how to build a healthy plate, portion sizes, eating frequently across the day). Pt last A1C was elevated 5.9, may have prediabetes. Discussed the differences between complex and refined carbs, recommended pt replace refined carbs with complex. Reviewed the benefits swapping in complex carbs and moderating portion sizes can have on managing blood glucose with patient. Per discussion, pt does use canned/convenience foods often. Pt does not add salt to food. Pt does eat out frequently. Pt expressed understanding of the information reviewed. Pt aware of nutrition education classes offered and would like to attend nutrition classes.   Nutrition Diagnosis ? Food-and nutrition-related knowledge deficit related to lack of exposure to information as related to diagnosis of: ? CVD   Nutrition Intervention ? Pt's individual nutrition plan and goals reviewed with pt.  Nutrition Goal(s):  ? Pt to identify and limit food sources of saturated fat, trans fat, refined carbohydrates and sodium ? Pt to decrease consumption of fried foods and sugar sweetened beverages ? Pt to build a healthy plate including vegetables, fruits, whole grains, and low-fat dairy products in a heart healthy meal plan.  Plan:  Pt to attend nutrition classes ? Nutrition I ? Nutrition II ? Portion Distortion  Will provide client-centered nutrition education as part of interdisciplinary care.   Monitor and evaluate progress toward nutrition goal with team.  Laurina Bustle, MS, RD, LDN 10/07/2018 10:10 AM

## 2018-10-08 ENCOUNTER — Other Ambulatory Visit: Payer: Self-pay | Admitting: Neurology

## 2018-10-08 ENCOUNTER — Ambulatory Visit (HOSPITAL_COMMUNITY): Payer: Medicare Other

## 2018-10-08 NOTE — Telephone Encounter (Signed)
Attempted to explain the situation with the patient. He refused to listen and became aggressive. I advised him about Dr. Theodosia Blender comments, but he refused and hung up.

## 2018-10-09 ENCOUNTER — Ambulatory Visit (HOSPITAL_COMMUNITY): Payer: Medicare Other

## 2018-10-12 ENCOUNTER — Ambulatory Visit (HOSPITAL_COMMUNITY): Payer: Medicare Other

## 2018-10-13 ENCOUNTER — Encounter (HOSPITAL_COMMUNITY): Payer: Self-pay | Admitting: Cardiac Rehabilitation

## 2018-10-13 DIAGNOSIS — I214 Non-ST elevation (NSTEMI) myocardial infarction: Secondary | ICD-10-CM

## 2018-10-13 NOTE — Progress Notes (Signed)
Cardiac Individual Treatment Plan  Patient Details  Name: [name] MRN: [id number] Date of Birth: 1946-05-08 Referring Provider:   Flowsheet Row CARDIAC REHAB PHASE II ORIENTATION from 09/29/2018 in [facility]  Referring Provider  Dr. Radford Pax      Initial Encounter Date:  Flowsheet Row CARDIAC REHAB PHASE II ORIENTATION from 09/29/2018 in [facility]  Date  09/29/18      Visit Diagnosis: 08/21/2018 NSTEMI  Patient's Home Medications on Admission:  Current Outpatient Medications:  .  ALPRAZolam (XANAX) 1 MG tablet, Take 1 mg by mouth at bedtime as needed for anxiety., Disp: , Rfl:  .  amLODipine (NORVASC) 10 MG tablet, TAKE ONE TABLET BY MOUTH DAILY, Disp: 90 tablet, Rfl: 2 .  Ascorbic Acid (VITAMIN C) 1000 MG tablet, Take 1,000 mg by mouth 2 (two) times daily., Disp: , Rfl:  .  aspirin EC 81 MG tablet, Take 81 mg by mouth daily., Disp: , Rfl:  .  aspirin-acetaminophen-caffeine (EXCEDRIN EXTRA STRENGTH) 250-250-65 MG tablet, Take 2 tablets by mouth every 6 (six) hours as needed for headache., Disp: , Rfl:  .  butalbital-aspirin-caffeine-codeine (FIORINAL WITH CODEINE) 50-325-40-30 MG capsule, TAKE ONE CAPSULE BY MOUTH EVERY 4 HOURS AS NEEDED FOR PAIN (Patient taking differently: Take 2 capsules by mouth every 4 (four) hours as needed for migraine. ), Disp: 15 capsule, Rfl: 0 .  diazepam (VALIUM) 5 MG tablet, Take 2.5-5 mg by mouth daily as needed for anxiety., Disp: , Rfl:  .  ibuprofen (ADVIL,MOTRIN) 200 MG tablet, Take 200-400 mg by mouth daily as needed (for arthritis in the feet)., Disp: , Rfl:  .  losartan (COZAAR) 25 MG tablet, Take 1 tablet (25 mg total) by mouth daily., Disp: 90 tablet, Rfl: 3 .  metoprolol succinate (TOPROL-XL) 100 MG 24 hr tablet, TAKE ONE TABLET BY MOUTH DAILY, Disp: 90 tablet, Rfl: 1 .  niacin 500 MG tablet, Take 500 mg by mouth at bedtime., Disp: , Rfl:  .  nitroGLYCERIN (NITROSTAT) 0.4 MG SL  tablet, Place 1 tablet (0.4 mg total) under the tongue every 5 (five) minutes x 3 doses as needed for chest pain., Disp: 25 tablet, Rfl: 12 .  rosuvastatin (CRESTOR) 20 MG tablet, Take 1 tablet (20 mg total) by mouth daily., Disp: 90 tablet, Rfl: 3 .  Specialty Vitamins Products (CENTRUM SPECIALIST ENERGY) TABS, Take 1 tablet by mouth daily with breakfast., Disp: , Rfl:  .  Testosterone (ANDROGEL) 40.5 MG/2.5GM (1.62%) GEL, Place 1 application onto the skin See admin instructions. Apply 1 pump to each shoulder once a day, Disp: , Rfl:  .  TURMERIC PO, Take 1 capsule by mouth daily with breakfast. , Disp: , Rfl:  .  vitamin E 400 UNIT capsule, Take 400 Units by mouth at bedtime. , Disp: , Rfl:  .  zonisamide (ZONEGRAN) 25 MG capsule, TAKE 2 CAPSULES BY MOUTH DAILY, Disp: 180 capsule, Rfl: 0  Past Medical History: Past Medical History:  Diagnosis Date  . Anal fissure   . Arthritis   . Bicuspid aortic valve    with mild to moderate AR by echo 10/2017  . BPH (benign prostatic hyperplasia)   . CAD (coronary artery disease)    s/p CABG [facility]  . Depression   . Dilated aortic root (HCC)    26mm aortic root and 62mm ascending aorta by echo 10/2017  . History of blood transfusion 1998   "due to cardiac bypass surgery"  .  Hyperlipidemia   . Hypertension   . Hypogonadism in male   . Kidney stones   . Migraine   . Retinal hemorrhage 11/04/2014  . Sleep apnea with use of continuous positive airway pressure (CPAP)     Tobacco Use: Social History   Tobacco Use  Smoking Status Former Smoker  . Years: 20.00  . Last attempt to quit: 07/22/1986  . Years since quitting: 32.2  Smokeless Tobacco Never Used    Labs: Recent Chemical engineer    Labs for ITP Cardiac and Pulmonary Rehab Latest Ref Rng & Units 10/05/2014 10/17/2017 12/08/2017 08/22/2018   Cholestrol 0 - 200 mg/dL 133 151 116 87   LDLCALC 0 - 99 mg/dL 63 79 55 44   HDL >40 mg/dL 43.30 44 37(L) 31(L)   Trlycerides <150  mg/dL 135.0 140 122 58   Hemoglobin A1c 4.8 - 5.6 % 5.9 - - 5.9(H)      Capillary Blood Glucose: No results found for: GLUCAP   Exercise Target Goals: Exercise Program Goal: Individual exercise prescription set using results from initial 6 min walk test and THRR while considering  patient's activity barriers and safety.   Exercise Prescription Goal: Initial exercise prescription builds to 30-45 minutes a day of aerobic activity, 2-3 days per week.  Home exercise guidelines will be given to patient during program as part of exercise prescription that the participant will acknowledge.  Activity Barriers & Risk Stratification: Activity Barriers & Cardiac Risk Stratification - 09/29/18 1009    Activity Barriers & Cardiac Risk Stratification          Activity Barriers  Arthritis   Arthritis is both feet   Cardiac Risk Stratification  High           6 Minute Walk: 6 Minute Walk    6 Minute Walk    Row Name 09/29/18 1008   Phase  Initial   Distance  1964 feet   Walk Time  6 minutes   # of Rest Breaks  0   MPH  3.72   METS  3.99   RPE  12   Perceived Dyspnea   0   VO2 Peak  13.97   Symptoms  No   Resting HR  71 bpm   Resting BP  102/62   Resting Oxygen Saturation   100 %   Exercise Oxygen Saturation  during 6 min walk  100 %   Max Ex. HR  81 bpm   Max Ex. BP  122/62   2 Minute Post BP  112/58          Oxygen Initial Assessment:   Oxygen Re-Evaluation:   Oxygen Discharge (Final Oxygen Re-Evaluation):   Initial Exercise Prescription: Initial Exercise Prescription - 09/29/18 1000    Date of Initial Exercise RX and Referring Provider          Date  09/29/18    Referring Provider  Dr. Radford Pax    Expected Discharge Date  01/04/19        Treadmill          MPH  3    Grade  1    Minutes  10        Bike          Level  1    Minutes  10    METs  3.7        NuStep          Level  3    SPM  85    Minutes  10    METs  3.8        Prescription  Details          Frequency (times per week)  3    Duration  Progress to 30 minutes of continuous aerobic without signs/symptoms of physical distress        Intensity          THRR 40-80% of Max Heartrate  60-120    Ratings of Perceived Exertion  11-13        Progression          Progression  Continue to progress workloads to maintain intensity without signs/symptoms of physical distress.        Resistance Training          Training Prescription  Yes    Weight  3 lbs.     Reps  10-15           Perform Capillary Blood Glucose checks as needed.  Exercise Prescription Changes:   Exercise Comments:   Exercise Goals and Review: Exercise Goals    Exercise Goals    Row Name 09/29/18 1010   Increase Physical Activity  Yes   Intervention  Provide advice, education, support and counseling about physical activity/exercise needs.;Develop an individualized exercise prescription for aerobic and resistive training based on initial evaluation findings, risk stratification, comorbidities and participant's personal goals.   Expected Outcomes  Short Term: Attend rehab on a regular basis to increase amount of physical activity.   Increase Strength and Stamina  Yes   Intervention  Provide advice, education, support and counseling about physical activity/exercise needs.;Develop an individualized exercise prescription for aerobic and resistive training based on initial evaluation findings, risk stratification, comorbidities and participant's personal goals.   Expected Outcomes  Short Term: Increase workloads from initial exercise prescription for resistance, speed, and METs.   Able to understand and use rate of perceived exertion (RPE) scale  Yes   Intervention  Provide education and explanation on how to use RPE scale   Expected Outcomes  Short Term: Able to use RPE daily in rehab to express subjective intensity level;Long Term:  Able to use RPE to guide intensity level when exercising  independently   Knowledge and understanding of Target Heart Rate Range (THRR)  Yes   Intervention  Provide education and explanation of THRR including how the numbers were predicted and where they are located for reference   Expected Outcomes  Short Term: Able to state/look up THRR;Long Term: Able to use THRR to govern intensity when exercising independently;Short Term: Able to use daily as guideline for intensity in rehab   Able to check pulse independently  Yes   Intervention  Provide education and demonstration on how to check pulse in carotid and radial arteries.;Review the importance of being able to check your own pulse for safety during independent exercise   Expected Outcomes  Short Term: Able to explain why pulse checking is important during independent exercise;Long Term: Able to check pulse independently and accurately   Understanding of Exercise Prescription  Yes   Intervention  Provide education, explanation, and written materials on patient's individual exercise prescription   Expected Outcomes  Short Term: Able to explain program exercise prescription;Long Term: Able to explain home exercise prescription to exercise independently          Exercise Goals Re-Evaluation :   Discharge Exercise Prescription (Final Exercise Prescription Changes):   Nutrition:  Target Goals: Understanding of nutrition guidelines,  daily intake of sodium 1500mg , cholesterol 200mg , calories 30% from fat and 7% or less from saturated fats, daily to have 5 or more servings of fruits and vegetables.  Biometrics: Pre Biometrics - 09/29/18 1010    Pre Biometrics          Height  5' 6.5" (1.689 m)    Weight  152 lb 8.9 oz (69.2 kg)    Waist Circumference  33 inches    Hip Circumference  36 inches    Waist to Hip Ratio  0.92 %    BMI (Calculated)  24.26    Triceps Skinfold  25 mm    % Body Fat  25.3 %    Grip Strength  34 kg    Flexibility  7 in    Single Leg Stand  27.47 seconds             Nutrition Therapy Plan and Nutrition Goals: Nutrition Therapy & Goals - 10/07/18 1007    Nutrition Therapy          Diet  heart healthy        Personal Nutrition Goals          Nutrition Goal  Pt to decrease consumption of fried foods and sugar sweetened beverages    Personal Goal #2  Pt to identify and limit food sources of saturated fat, trans fat, refined carbohydrates and sodium    Personal Goal #3  Pt to build a healthy plate including vegetables, fruits, whole grains, and low-fat dairy products in a heart healthy meal plan.        Intervention Plan          Intervention  Prescribe, educate and counsel regarding individualized specific dietary modifications aiming towards targeted core components such as weight, hypertension, lipid management, diabetes, heart failure and other comorbidities.    Expected Outcomes  Short Term Goal: Understand basic principles of dietary content, such as calories, fat, sodium, cholesterol and nutrients.;Long Term Goal: Adherence to prescribed nutrition plan.           Nutrition Assessments: Nutrition Assessments - 10/05/18 1147    MEDFICTS Scores          Pre Score  98           Nutrition Goals Re-Evaluation:   Nutrition Goals Re-Evaluation:   Nutrition Goals Discharge (Final Nutrition Goals Re-Evaluation):   Psychosocial: Target Goals: Acknowledge presence or absence of significant depression and/or stress, maximize coping skills, provide positive support system. Participant is able to verbalize types and ability to use techniques and skills needed for reducing stress and depression.  Initial Review & Psychosocial Screening: Initial Psych Review & Screening - 09/29/18 1403    Initial Review          Current issues with  Current Anxiety/Panic   pt rates his stress and anxiety level is "low". Pt has valium as prn med and Xanax prn bedtime       Family Dynamics          Good Support System?  No    Comments  Pt has  girlfreind who lives in [place]; Family lives in [place] and [place].  Pt would like improvement in his support system.  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  Screening Interventions          Expected Outcomes  Long Term goal: The participant improves quality of Life and PHQ9 Scores as seen by post scores and/or verbalization of changes;Short Term goal: Identification and review with participant of any Quality of Life or Depression concerns found by scoring the questionnaire.           Quality of Life Scores: Quality of Life - 09/29/18 1011    Quality of Life          Select  Quality of Life        Quality of Life Scores          Health/Function Pre  24.8 %    Socioeconomic Pre  20 %    Psych/Spiritual Pre  24 %    Family Pre  25.5 %    GLOBAL Pre  23.7 %          Scores of 19 and below usually indicate a poorer quality of life in these areas.  A difference of  2-3 points is a clinically meaningful difference.  A difference of 2-3 points in the total score of the Quality of Life Index has been associated with significant improvement in overall quality of life, self-image, physical symptoms, and general health in studies assessing change in quality of life.  PHQ-9: Recent Review Flowsheet Data    There is no flowsheet data to display.     Interpretation of Total Score  Total Score Depression Severity:  1-4 = Minimal depression, 5-9 = Mild depression, 10-14 = Moderate depression, 15-19 = Moderately severe depression, 20-27 = Severe depression   Psychosocial Evaluation and Intervention:   Psychosocial Re-Evaluation:   Psychosocial Discharge (Final Psychosocial Re-Evaluation):   Vocational Rehabilitation: Provide vocational rehab assistance to qualifying candidates.   Vocational Rehab  Evaluation & Intervention: Vocational Rehab - 09/29/18 1412    Initial Vocational Rehab Evaluation & Intervention          Assessment shows need for Vocational Rehabilitation  No   pt is retired          Education: Education Goals: Education classes will be provided on a weekly basis, covering required topics. Participant will state understanding/return demonstration of topics presented.  Learning Barriers/Preferences: Learning Barriers/Preferences - 09/29/18 1012    Learning Barriers/Preferences          Learning Barriers  None    Learning Preferences  Audio;Written Material           Education Topics: Count Your Pulse:  -Group instruction provided by verbal instruction, demonstration, patient participation and written materials to support subject.  Instructors address importance of being able to find your pulse and how to count your pulse when at home without a heart monitor.  Patients get hands on experience counting their pulse with staff help and individually.   Heart Attack, Angina, and Risk Factor Modification:  -Group instruction provided by verbal instruction, video, and written materials to support subject.  Instructors address signs and symptoms of angina and heart attacks.    Also discuss risk factors for heart disease and how to make changes to improve heart health risk factors.   Functional Fitness:  -Group instruction provided by verbal instruction, demonstration, patient participation, and written materials to support subject.  Instructors address safety measures for doing things around the house.  Discuss how to get up and down off the floor, how to pick things up properly, how to safely get out of a chair without assistance,  and balance training.   Meditation and Mindfulness:  -Group instruction provided by verbal instruction, patient participation, and written materials to support subject.  Instructor addresses importance of mindfulness and meditation  practice to help reduce stress and improve awareness.  Instructor also leads participants through a meditation exercise.    Stretching for Flexibility and Mobility:  -Group instruction provided by verbal instruction, patient participation, and written materials to support subject.  Instructors lead participants through series of stretches that are designed to increase flexibility thus improving mobility.  These stretches are additional exercise for major muscle groups that are typically performed during regular warm up and cool down.   Hands Only CPR:  -Group verbal, video, and participation provides a basic overview of AHA guidelines for community CPR. Role-play of emergencies allow participants the opportunity to practice calling for help and chest compression technique with discussion of AED use.   Hypertension: -Group verbal and written instruction that provides a basic overview of hypertension including the most recent diagnostic guidelines, risk factor reduction with self-care instructions and medication management.    Nutrition I class: Heart Healthy Eating:  -Group instruction provided by PowerPoint slides, verbal discussion, and written materials to support subject matter. The instructor gives an explanation and review of the Therapeutic Lifestyle Changes diet recommendations, which includes a discussion on lipid goals, dietary fat, sodium, fiber, plant stanol/sterol esters, sugar, and the components of a well-balanced, healthy diet.   Nutrition II class: Lifestyle Skills:  -Group instruction provided by PowerPoint slides, verbal discussion, and written materials to support subject matter. The instructor gives an explanation and review of label reading, grocery shopping for heart health, heart healthy recipe modifications, and ways to make healthier choices when eating out.   Diabetes Question & Answer:  -Group instruction provided by PowerPoint slides, verbal discussion, and  written materials to support subject matter. The instructor gives an explanation and review of diabetes co-morbidities, pre- and post-prandial blood glucose goals, pre-exercise blood glucose goals, signs, symptoms, and treatment of hypoglycemia and hyperglycemia, and foot care basics.   Diabetes Blitz:  -Group instruction provided by PowerPoint slides, verbal discussion, and written materials to support subject matter. The instructor gives an explanation and review of the physiology behind type 1 and type 2 diabetes, diabetes medications and rational behind using different medications, pre- and post-prandial blood glucose recommendations and Hemoglobin A1c goals, diabetes diet, and exercise including blood glucose guidelines for exercising safely.    Portion Distortion:  -Group instruction provided by PowerPoint slides, verbal discussion, written materials, and food models to support subject matter. The instructor gives an explanation of serving size versus portion size, changes in portions sizes over the last 20 years, and what consists of a serving from each food group.   Stress Management:  -Group instruction provided by verbal instruction, video, and written materials to support subject matter.  Instructors review role of stress in heart disease and how to cope with stress positively.     Exercising on Your Own:  -Group instruction provided by verbal instruction, power point, and written materials to support subject.  Instructors discuss benefits of exercise, components of exercise, frequency and intensity of exercise, and end points for exercise.  Also discuss use of nitroglycerin and activating EMS.  Review options of places to exercise outside of rehab.  Review guidelines for sex with heart disease.   Cardiac Drugs I:  -Group instruction provided by verbal instruction and written materials to support subject.  Instructor reviews cardiac drug classes: antiplatelets, anticoagulants, beta  blockers, and statins.  Instructor discusses reasons, side effects, and lifestyle considerations for each drug class.   Cardiac Drugs II:  -Group instruction provided by verbal instruction and written materials to support subject.  Instructor reviews cardiac drug classes: angiotensin converting enzyme inhibitors (ACE-I), angiotensin II receptor blockers (ARBs), nitrates, and calcium channel blockers.  Instructor discusses reasons, side effects, and lifestyle considerations for each drug class.   Anatomy and Physiology of the Circulatory System:  Group verbal and written instruction and models provide basic cardiac anatomy and physiology, with the coronary electrical and arterial systems. Review of: AMI, Angina, Valve disease, Heart Failure, Peripheral Artery Disease, Cardiac Arrhythmia, Pacemakers, and the ICD.   Other Education:  -Group or individual verbal, written, or video instructions that support the educational goals of the cardiac rehab program.   Holiday Eating Survival Tips:  -Group instruction provided by PowerPoint slides, verbal discussion, and written materials to support subject matter. The instructor gives patients tips, tricks, and techniques to help them not only survive but enjoy the holidays despite the onslaught of food that accompanies the holidays.   Knowledge Questionnaire Score: Knowledge Questionnaire Score - 09/29/18 1011    Knowledge Questionnaire Score          Pre Score  23/24           Core Components/Risk Factors/Patient Goals at Admission: Personal Goals and Risk Factors at Admission - 09/29/18 1012    Core Components/Risk Factors/Patient Goals on Admission           Weight Management  Yes;Weight Maintenance    Intervention  Weight Management: Develop a combined nutrition and exercise program designed to reach desired caloric intake, while maintaining appropriate intake of nutrient and fiber, sodium and fats, and appropriate energy expenditure  required for the weight goal.;Weight Management: Provide education and appropriate resources to help participant work on and attain dietary goals.    Admit Weight  152 lb 8.9 oz (69.2 kg)    Expected Outcomes  Short Term: Continue to assess and modify interventions until short term weight is achieved;Long Term: Adherence to nutrition and physical activity/exercise program aimed toward attainment of established weight goal;Weight Maintenance: Understanding of the daily nutrition guidelines, which includes 25-35% calories from fat, 7% or less cal from saturated fats, less than 200mg  cholesterol, less than 1.5gm of sodium, & 5 or more servings of fruits and vegetables daily;Understanding recommendations for meals to include 15-35% energy as protein, 25-35% energy from fat, 35-60% energy from carbohydrates, less than 200mg  of dietary cholesterol, 20-35 gm of total fiber daily;Understanding of distribution of calorie intake throughout the day with the consumption of 4-5 meals/snacks    Hypertension  Yes    Intervention  Provide education on lifestyle modifcations including regular physical activity/exercise, weight management, moderate sodium restriction and increased consumption of fresh fruit, vegetables, and low fat dairy, alcohol moderation, and smoking cessation.;Monitor prescription use compliance.    Expected Outcomes  Short Term: Continued assessment and intervention until BP is < 140/69mm HG in hypertensive participants. < 130/49mm HG in hypertensive participants with diabetes, heart failure or chronic kidney disease.;Long Term: Maintenance of blood pressure at goal levels.    Lipids  Yes    Intervention  Provide education and support for participant on nutrition & aerobic/resistive exercise along with prescribed medications to achieve LDL 70mg , HDL >40mg .    Expected Outcomes  Short Term: Participant states understanding of desired cholesterol values and is compliant with medications prescribed.  Participant is following exercise prescription and nutrition  guidelines.;Long Term: Cholesterol controlled with medications as prescribed, with individualized exercise RX and with personalized nutrition plan. Value goals: LDL < 70mg , HDL > 40 mg.    Stress  Yes    Intervention  Offer individual and/or small group education and counseling on adjustment to heart disease, stress management and health-related lifestyle change. Teach and support self-help strategies.;Refer participants experiencing significant psychosocial distress to appropriate mental health specialists for further evaluation and treatment. When possible, include family members and significant others in education/counseling sessions.    Expected Outcomes  Short Term: Participant demonstrates changes in health-related behavior, relaxation and other stress management skills, ability to obtain effective social support, and compliance with psychotropic medications if prescribed.;Long Term: Emotional wellbeing is indicated by absence of clinically significant psychosocial distress or social isolation.           Core Components/Risk Factors/Patient Goals Review:    Core Components/Risk Factors/Patient Goals at Discharge (Final Review):    ITP Comments: ITP Comments    Row Name 09/29/18 0746 10/13/18 1548   ITP Comments  Medical Director- Dr. Fransico Him, MD  30 day ITP review. pt currently on hold for CR dept closure as part of COVID 19 precautions.       Comments:  Andi Hence, RN, BSN Cardiac Pulmonary Rehab

## 2018-10-14 ENCOUNTER — Ambulatory Visit (HOSPITAL_COMMUNITY): Payer: Medicare Other

## 2018-10-14 ENCOUNTER — Telehealth (HOSPITAL_COMMUNITY): Payer: Self-pay

## 2018-10-14 NOTE — Telephone Encounter (Signed)
Called pt to discuss Nutrition education and counseling for heart healthy diet. No answer, left voicemail with contact information and a request to call the dietitian back.    Laurina Bustle, MS, RD, LDN 10/14/2018 1:33 PM

## 2018-10-16 ENCOUNTER — Ambulatory Visit (HOSPITAL_COMMUNITY): Payer: Medicare Other

## 2018-10-19 ENCOUNTER — Ambulatory Visit (HOSPITAL_COMMUNITY): Payer: Medicare Other

## 2018-10-20 ENCOUNTER — Encounter: Payer: Self-pay | Admitting: Adult Health

## 2018-10-21 ENCOUNTER — Ambulatory Visit (HOSPITAL_COMMUNITY): Payer: Medicare Other

## 2018-10-21 ENCOUNTER — Other Ambulatory Visit: Payer: Self-pay | Admitting: Adult Health

## 2018-10-21 DIAGNOSIS — F5104 Psychophysiologic insomnia: Secondary | ICD-10-CM

## 2018-10-21 MED ORDER — BUTALBITAL-ASA-CAFF-CODEINE 50-325-40-30 MG PO CAPS: ORAL_CAPSULE | ORAL | 0 refills | Status: DC

## 2018-10-21 NOTE — Telephone Encounter (Signed)
Drug registry checked xanax last fill 09-21-18 #30.

## 2018-10-23 ENCOUNTER — Ambulatory Visit (HOSPITAL_COMMUNITY): Payer: Medicare Other

## 2018-10-26 ENCOUNTER — Ambulatory Visit (HOSPITAL_COMMUNITY): Payer: Medicare Other

## 2018-10-28 ENCOUNTER — Ambulatory Visit (HOSPITAL_COMMUNITY): Payer: Medicare Other

## 2018-10-28 ENCOUNTER — Telehealth (HOSPITAL_COMMUNITY): Payer: Self-pay | Admitting: Cardiac Rehabilitation

## 2018-10-28 NOTE — Telephone Encounter (Signed)
Pt phone call to inform of continued Outpatient Cardiac Rehab departmental closure for COVID 19 precautions. Future opening date to be determined. pt instructed to notify PCP or cardiologist for symptoms, questions or concerns.  Left message on answering machine.  Andi Hence, RN, BSN Cardiac Pulmonary Rehab

## 2018-10-30 ENCOUNTER — Ambulatory Visit (HOSPITAL_COMMUNITY): Payer: Medicare Other

## 2018-11-02 ENCOUNTER — Ambulatory Visit (HOSPITAL_COMMUNITY): Payer: Medicare Other

## 2018-11-04 ENCOUNTER — Ambulatory Visit (HOSPITAL_COMMUNITY): Payer: Medicare Other

## 2018-11-06 ENCOUNTER — Ambulatory Visit (HOSPITAL_COMMUNITY): Payer: Medicare Other

## 2018-11-09 ENCOUNTER — Ambulatory Visit (HOSPITAL_COMMUNITY): Payer: Medicare Other

## 2018-11-09 ENCOUNTER — Other Ambulatory Visit (HOSPITAL_COMMUNITY): Payer: Medicare Other

## 2018-11-11 ENCOUNTER — Ambulatory Visit (HOSPITAL_COMMUNITY): Payer: Medicare Other

## 2018-11-13 ENCOUNTER — Ambulatory Visit (HOSPITAL_COMMUNITY): Payer: Medicare Other

## 2018-11-16 ENCOUNTER — Ambulatory Visit (HOSPITAL_COMMUNITY): Payer: Medicare Other

## 2018-11-18 ENCOUNTER — Ambulatory Visit (HOSPITAL_COMMUNITY): Payer: Medicare Other

## 2018-11-20 ENCOUNTER — Ambulatory Visit (HOSPITAL_COMMUNITY): Payer: Medicare Other

## 2018-11-23 ENCOUNTER — Ambulatory Visit (HOSPITAL_COMMUNITY): Payer: Medicare Other

## 2018-11-25 ENCOUNTER — Ambulatory Visit (HOSPITAL_COMMUNITY): Payer: Medicare Other

## 2018-11-27 ENCOUNTER — Ambulatory Visit (HOSPITAL_COMMUNITY): Payer: Medicare Other

## 2018-11-30 ENCOUNTER — Ambulatory Visit (HOSPITAL_COMMUNITY): Payer: Medicare Other

## 2018-12-02 ENCOUNTER — Ambulatory Visit (HOSPITAL_COMMUNITY): Payer: Medicare Other

## 2018-12-04 ENCOUNTER — Ambulatory Visit (HOSPITAL_COMMUNITY): Payer: Medicare Other

## 2018-12-07 ENCOUNTER — Ambulatory Visit (HOSPITAL_COMMUNITY): Payer: Medicare Other

## 2018-12-09 ENCOUNTER — Ambulatory Visit (HOSPITAL_COMMUNITY): Payer: Medicare Other

## 2018-12-11 ENCOUNTER — Ambulatory Visit (HOSPITAL_COMMUNITY): Payer: Medicare Other

## 2018-12-16 ENCOUNTER — Ambulatory Visit (HOSPITAL_COMMUNITY): Payer: Medicare Other

## 2018-12-18 ENCOUNTER — Ambulatory Visit (HOSPITAL_COMMUNITY): Payer: Medicare Other

## 2018-12-21 ENCOUNTER — Ambulatory Visit (HOSPITAL_COMMUNITY): Payer: Medicare Other

## 2018-12-21 ENCOUNTER — Other Ambulatory Visit: Payer: Self-pay | Admitting: Cardiology

## 2018-12-23 ENCOUNTER — Ambulatory Visit (HOSPITAL_COMMUNITY): Payer: Medicare Other

## 2018-12-24 ENCOUNTER — Telehealth: Payer: Self-pay | Admitting: *Deleted

## 2018-12-24 NOTE — Telephone Encounter (Signed)
Due to current COVID 19 pandemic, our office is severely reducing in office visits until further notice, in order to minimize the risk to our patients and healthcare providers.  Pt understands that although there may be some limitations with this type of visit, we will take all precautions to reduce any security or privacy concerns.  Pt understands that this will be treated like an in office visit and we will file with pt's insurance, and there may be a patient responsible charge related to this service. Pt consented to [email or web address] visit.  email sent to [email or web address].

## 2018-12-25 ENCOUNTER — Ambulatory Visit (HOSPITAL_COMMUNITY): Payer: Medicare Other

## 2018-12-27 ENCOUNTER — Encounter: Payer: Self-pay | Admitting: Adult Health

## 2018-12-28 ENCOUNTER — Ambulatory Visit (HOSPITAL_COMMUNITY): Payer: Medicare Other

## 2018-12-29 ENCOUNTER — Ambulatory Visit (INDEPENDENT_AMBULATORY_CARE_PROVIDER_SITE_OTHER): Payer: Medicare Other | Admitting: Adult Health

## 2018-12-29 ENCOUNTER — Ambulatory Visit: Payer: Medicare Other | Admitting: Adult Health

## 2018-12-29 ENCOUNTER — Other Ambulatory Visit: Payer: Self-pay

## 2018-12-29 DIAGNOSIS — G43109 Migraine with aura, not intractable, without status migrainosus: Secondary | ICD-10-CM

## 2018-12-29 DIAGNOSIS — G4733 Obstructive sleep apnea (adult) (pediatric): Secondary | ICD-10-CM | POA: Diagnosis not present

## 2018-12-29 DIAGNOSIS — Z9989 Dependence on other enabling machines and devices: Secondary | ICD-10-CM

## 2018-12-29 DIAGNOSIS — F5104 Psychophysiologic insomnia: Secondary | ICD-10-CM | POA: Diagnosis not present

## 2018-12-29 DIAGNOSIS — I2510 Atherosclerotic heart disease of native coronary artery without angina pectoris: Secondary | ICD-10-CM

## 2018-12-29 NOTE — Progress Notes (Signed)
PATIENT: [name] DOB: 1945/12/15  REASON FOR VISIT: follow up HISTORY FROM: patient  Virtual Visit via Video Note  I connected with [name] on 12/29/18 at  8:30 AM EDT by a video enabled telemedicine application located remotely at [facility] and verified that I am speaking with the correct person using two identifiers who was located at their own home.   I discussed the limitations of evaluation and management by telemedicine and the availability of in person appointments. The patient expressed understanding and agreed to proceed.   PATIENT: [name] DOB: 1946/01/06  REASON FOR VISIT: follow up HISTORY FROM: patient  HISTORY OF PRESENT ILLNESS: Today 12/29/18:  [name] is a 73 year old male with a history of obstructive sleep apnea on CPAP, migraine headaches and insomnia.  He joins me today for a virtual visit.  His download indicates that he uses machine nightly for compliance of 100%.  He uses machine greater than 4 hours 29 out of 30 days for compliance of 97%.  On average he uses the machine 7 hours and 2 minutes.  His residual AHI is 2.4 on 10 cm of water with EPR of 3.  He has a leak in the 95th percentile at 24.2 L/min.  He states that the leak is from his beard.  Reports that due to COVID-19 he was unable to get his beard trimmed. he continues to take Xanax extended release at bedtime for insomnia.  He continues on Zonegran and Fioricet for his migraine.  Reports that he has approximately 1 headache a week.  His headaches tend to respond well to Fioricet.  He states that he typically takes Excedrin Migraine first.  His headache will typically resolve in 45 minutes if it does not he will then take Fioricet and the headache will resolve in 20 to 25 minutes.  HISTORY 04/20/18 :  [name] is a 73 year old male with a history of obstructive sleep apnea on CPAP, migraine headaches and insomnia.  He returns today for follow-up.  His  CPAP download indicates that he uses machine nightly for compliance of 100%.  He uses machine greater than 4 hours 29 out of 30 days for compliance of 97%.  On average he uses his machine 7 hours and 44 minutes.  His residual AHI is 1.8 on 10 cm of water with EPR of 3.  He does not have a significant leak.  He feels that the CPAP continues to work well for him.  He states that he has been taking Xanax extended release 1 mg at bedtime.  He reports that there are times that he feels that he needs a stronger dose.  However he does not want to adjust his medicine at this time.  He continues to have approximately 2-3 headaches a month.  He states that this is a significant improvement.  He continues on Zonegran.  He will use Fioricet if needed.  He returns today for an evaluation.  REVIEW OF SYSTEMS: Out of a complete 14 system review of symptoms, the patient complains only of the following symptoms, and all other reviewed systems are negative.  See HPI  ALLERGIES: Allergies  Allergen Reactions  . Mushroom Extract Complex Nausea Only and Other (See Comments)    Severe vertigo and headaches    HOME MEDICATIONS: Outpatient Medications Prior to Visit  Medication Sig Dispense Refill  . ALPRAZolam (XANAX) 1 MG tablet Take 1 mg by mouth at bedtime as needed for anxiety.    Marland Kitchen  ALPRAZOLAM XR 1 MG 24 hr tablet TAKE ONE TABLET BY MOUTH DAILY 30 tablet 5  . amLODipine (NORVASC) 10 MG tablet TAKE ONE TABLET BY MOUTH DAILY 90 tablet 2  . Ascorbic Acid (VITAMIN C) 1000 MG tablet Take 1,000 mg by mouth 2 (two) times daily.    Marland Kitchen aspirin EC 81 MG tablet Take 81 mg by mouth daily.    Marland Kitchen aspirin-acetaminophen-caffeine (EXCEDRIN EXTRA STRENGTH) 250-250-65 MG tablet Take 2 tablets by mouth every 6 (six) hours as needed for headache.    . butalbital-aspirin-caffeine-codeine (FIORINAL WITH CODEINE) 50-325-40-30 MG capsule TAKE ONE CAPSULE BY MOUTH EVERY 4 HOURS AS NEEDED FOR PAIN 15 capsule 0  . diazepam (VALIUM) 5 MG  tablet Take 2.5-5 mg by mouth daily as needed for anxiety.    Marland Kitchen ibuprofen (ADVIL,MOTRIN) 200 MG tablet Take 200-400 mg by mouth daily as needed (for arthritis in the feet).    Marland Kitchen losartan (COZAAR) 25 MG tablet TAKE ONE TABLET BY MOUTH DAILY 90 tablet 2  . metoprolol succinate (TOPROL-XL) 100 MG 24 hr tablet TAKE ONE TABLET BY MOUTH DAILY 90 tablet 1  . niacin 500 MG tablet Take 500 mg by mouth at bedtime.    . nitroGLYCERIN (NITROSTAT) 0.4 MG SL tablet Place 1 tablet (0.4 mg total) under the tongue every 5 (five) minutes x 3 doses as needed for chest pain. 25 tablet 12  . rosuvastatin (CRESTOR) 20 MG tablet Take 1 tablet (20 mg total) by mouth daily. 90 tablet 3  . Specialty Vitamins Products (CENTRUM SPECIALIST ENERGY) TABS Take 1 tablet by mouth daily with breakfast.    . Testosterone (ANDROGEL) 40.5 MG/2.5GM (1.62%) GEL Place 1 application onto the skin See admin instructions. Apply 1 pump to each shoulder once a day    . TURMERIC PO Take 1 capsule by mouth daily with breakfast.     . vitamin E 400 UNIT capsule Take 400 Units by mouth at bedtime.     Marland Kitchen zonisamide (ZONEGRAN) 25 MG capsule TAKE 2 CAPSULES BY MOUTH DAILY 180 capsule 0   No facility-administered medications prior to visit.     PAST MEDICAL HISTORY: Past Medical History:  Diagnosis Date  . Anal fissure   . Arthritis   . Bicuspid aortic valve    with mild to moderate AR by echo 10/2017  . BPH (benign prostatic hyperplasia)   . CAD (coronary artery disease)    s/p CABG [place]  . Depression   . Dilated aortic root (HCC)    75mm aortic root and 33mm ascending aorta by echo 10/2017  . History of blood transfusion 1998   "due to cardiac bypass surgery"  . Hyperlipidemia   . Hypertension   . Hypogonadism in male   . Kidney stones   . Migraine   . Retinal hemorrhage 11/04/2014  . Sleep apnea with use of continuous positive airway pressure (CPAP)     PAST SURGICAL HISTORY: Past Surgical History:  Procedure  Laterality Date  . CORONARY ARTERY BYPASS GRAFT  1998  . LEFT HEART CATH AND CORONARY ANGIOGRAPHY N/A 08/24/2018   Procedure: LEFT HEART CATH AND CORONARY ANGIOGRAPHY;  Surgeon: Lorretta Harp, MD;  Location: [facility];  Service: Cardiovascular;  Laterality: N/A;  . TEE WITHOUT CARDIOVERSION N/A 01/19/2015   Procedure: TRANSESOPHAGEAL ECHOCARDIOGRAM (TEE);  Surgeon: Sueanne Margarita, MD;  Location: [facility];  Service: Cardiovascular;  Laterality: N/A;  . [place]   pt denies  FAMILY HISTORY: Family History  Problem Relation Age of Onset  . Arthritis Mother        deceased  . Hyperlipidemia Mother   . Diabetes Mother   . Arthritis Father   . Hyperlipidemia Father   . Heart disease Father 94  . Stroke Father 9       deceased  . Hypertension Father   . Diabetes Father   . Kidney disease Paternal Grandfather     SOCIAL HISTORY: Social History   Socioeconomic History  . Marital status: Divorced    Spouse name: Not on file  . Number of children: 0  . Years of education: Not on file  . Highest education level: Not on file  Occupational History  . Occupation: retired  Scientific laboratory technician  . Financial resource strain: Not on file  . Food insecurity:    Worry: Not on file    Inability: Not on file  . Transportation needs:    Medical: Not on file    Non-medical: Not on file  Tobacco Use  . Smoking status: Former Smoker    Years: 20.00    Last attempt to quit: 07/22/1986    Years since quitting: 32.4  . Smokeless tobacco: Never Used  Substance and Sexual Activity  . Alcohol use: Yes    Comment: 0-1 daily  . Drug use: No  . Sexual activity: Not on file  Lifestyle  . Physical activity:    Days per week: Not on file    Minutes per session: Not on file  . Stress: Not on file  Relationships  . Social connections:    Talks on phone: Not on file    Gets together: Not on file    Attends religious service: Not on file    Active member of club or  organization: Not on file    Attends meetings of clubs or organizations: Not on file    Relationship status: Not on file  . Intimate partner violence:    Fear of current or ex partner: Not on file    Emotionally abused: Not on file    Physically abused: Not on file    Forced sexual activity: Not on file  Other Topics Concern  . Not on file  Social History Narrative   Divorced   Secondary school teacher- retired   No children   Has a Neurosurgeon named [name]   Enjoys outdoor activities- hiking, swimming, Control and instrumentation engineer, baseball, writing, reading, cooking      PHYSICAL EXAM Generalized: Well developed, in no acute distress   Neurological examination  Mentation: Alert oriented to time, place, history taking. Follows all commands speech and language fluent Cranial nerve II-XII:Extraocular movements were full. Facial symmetry noted. uvula tongue midline. Head turning and shoulder shrug  were normal and symmetric. Motor: Good strength throughout subjectively per patient Sensory: Sensory testing is intact to soft touch on all 4 extremities subjectively per patient Coordination: Cerebellar testing reveals good finger-nose-finger  Gait and station: Patient is able to stand from a seated position. gait is normal.  Reflexes: UTA  DIAGNOSTIC DATA (LABS, IMAGING, TESTING) - I reviewed patient records, labs, notes, testing and imaging myself where available.  Lab Results  Component Value Date   WBC 9.4 09/21/2018   HGB 13.6 09/21/2018   HCT 39.5 09/21/2018   MCV 100.3 (H) 09/21/2018   PLT 248.0 09/21/2018      Component Value Date/Time   NA 142 09/03/2018 1532   K 4.6 09/03/2018 1532   CL 102  09/03/2018 1532   CO2 23 09/03/2018 1532   GLUCOSE 111 (H) 09/03/2018 1532   GLUCOSE 115 (H) 08/25/2018 0354   BUN 22 09/03/2018 1532   CREATININE 1.38 (H) 09/03/2018 1532   CALCIUM 9.3 09/03/2018 1532   PROT 6.6 08/21/2018 1837   ALBUMIN 3.8 08/21/2018 1837   AST 59 (H) 08/21/2018 1837   ALT 34 08/21/2018  1837   ALKPHOS 102 08/21/2018 1837   BILITOT 0.6 08/21/2018 1837   GFRNONAA 51 (L) 09/03/2018 1532   GFRAA 59 (L) 09/03/2018 1532   Lab Results  Component Value Date   CHOL 87 08/22/2018   HDL 31 (L) 08/22/2018   LDLCALC 44 08/22/2018   TRIG 58 08/22/2018   CHOLHDL 2.8 08/22/2018   Lab Results  Component Value Date   HGBA1C 5.9 (H) 08/22/2018   Lab Results  Component Value Date   [id number] 371 09/24/2018   No results found for: TSH    ASSESSMENT AND PLAN 73 y.o. year old male  has a past medical history of Anal fissure, Arthritis, Bicuspid aortic valve, BPH (benign prostatic hyperplasia), CAD (coronary artery disease), Depression, Dilated aortic root ([place]), History of blood transfusion (1998), Hyperlipidemia, Hypertension, Hypogonadism in male, Kidney stones, Migraine, Retinal hemorrhage (11/04/2014), and Sleep apnea with use of continuous positive airway pressure (CPAP). here with:  1.  Obstructive sleep apnea on CPAP 2.  Migraine headache  The patient CPAP download shows excellent compliance and good treatment of his apnea.  He is encouraged to continue using his CPAP nightly and greater than 4 hours each night.  The patient will continue on the zonegran and Fioricet for his migraines.  He will continue on Xanax extended release for insomnia.  If his symptoms worsen or he develops new symptoms he will let us know.  He will follow-up in 6 months or sooner if needed.   I spent 15 minutes with the patient this time was spent reviewing his CPAP download discussing his migraine headaches and plan of care.   [name], MSN, NP-C 12/29/2018, 8:21 AM [facility] [address], [place], [place] [address] [phone]

## 2018-12-30 ENCOUNTER — Ambulatory Visit (HOSPITAL_COMMUNITY): Payer: Medicare Other

## 2019-01-01 ENCOUNTER — Ambulatory Visit (HOSPITAL_COMMUNITY): Payer: Medicare Other

## 2019-01-04 ENCOUNTER — Ambulatory Visit (HOSPITAL_COMMUNITY): Payer: Medicare Other

## 2019-01-06 ENCOUNTER — Ambulatory Visit (HOSPITAL_COMMUNITY): Payer: Medicare Other

## 2019-01-08 ENCOUNTER — Ambulatory Visit (HOSPITAL_COMMUNITY): Payer: Medicare Other

## 2019-01-11 ENCOUNTER — Ambulatory Visit (HOSPITAL_COMMUNITY): Payer: Medicare Other

## 2019-01-13 ENCOUNTER — Ambulatory Visit (HOSPITAL_COMMUNITY): Payer: Medicare Other

## 2019-01-17 ENCOUNTER — Other Ambulatory Visit: Payer: Self-pay | Admitting: Neurology

## 2019-01-21 ENCOUNTER — Telehealth: Payer: Self-pay

## 2019-01-21 NOTE — Telephone Encounter (Signed)
-----   Message from Roetta Sessions, [place] sent at 12/23/2018 12:16 PM EDT ----- Regarding: FW: mutual patient  ----- Message ----- From: Roetta Sessions, CMA Sent: 12/23/2018 To: Roetta Sessions, CMA Subject: FW: mutual patient                             Call pt to confirm he is done with cardiac rehab. If so, schedule direct colon and previsit.  ----- Message ----- From: Yetta Flock, MD Sent: 09/22/2018   1:23 PM EST To: Roetta Sessions, CMA Subject: RE: mutual patient                             Direct recall is fine. Can you let the patient know to call and schedule once done with rehab. Thanks ----- Message ----- From: Roetta Sessions, CMA Sent: 09/22/2018   9:35 AM EST To: Yetta Flock, MD Subject: RE: mutual patient                             Will he need an office visit to discuss or just place a recall direct colon for June?  ----- Message ----- From: Yetta Flock, MD Sent: 09/21/2018   6:10 PM EST To: Roetta Sessions, CMA Subject: FW: mutual patient                             FYI, will let this patient complete cardiac rehab and then perform his colonoscopy in 3 months or so. Thanks ----- Message ----- From: Sueanne Margarita, MD Sent: 09/21/2018   5:27 PM EST To: Yetta Flock, MD Subject: RE: mutual patient                             2-3 months is typical for cardiac rehab  Traci ----- Message ----- From: Yetta Flock, MD Sent: 09/21/2018   4:35 PM EST To: Sueanne Margarita, MD Subject: mutual patient                                 Hello, Hope all is well. I wanted to touch base about this mutual patient. He was referred to me for rectal bleeding, which seems quite mild, and I saw he had a NSTEMI about 4 weeks ago. I told him may be best to allow him to complete his cardiac rehab and recover a few months and then do his procedure, as his bleeding does not seem significant. I was curious how long you anticipate him to be in rehab for the NSTEMI?  Thanks  Richardson Landry

## 2019-01-21 NOTE — Telephone Encounter (Signed)
Called pt and left detailed message about calling back with an update regarding his cardiac rehab program to see if he has completed that and to see if we can schedule his colonoscopy with Dr. Havery Moros.  Asked p to call back with update.

## 2019-01-27 NOTE — Telephone Encounter (Signed)
Left another message for pt to call us to give an update on cardiac rehab and to see if he is ready to schedule his colonoscopy that Dr. Havery Moros recommended in March for his rectal bleeding. Pt was also sent a MyChart message on 7-2

## 2019-01-28 NOTE — Telephone Encounter (Signed)
Called and spoke to pt. He will complete his rehab and get clearance from Dr. Radford Pax - Cardiology and then will call us to schedule his colonoscopy.   He said that his symptoms (abdominal pain and rectal bleeding) have resolved so he is not concerned about scheduling it immediately.  His last colon was in 2011 so he states he really isn't due until 2021.

## 2019-01-28 NOTE — Telephone Encounter (Signed)
Spoke with patient so due to the covid he was  not able to complete rehab but was able to complete orientation. He then started to exercise at home.

## 2019-02-01 MED ORDER — AMOXICILLIN 500 MG PO CAPS: ORAL_CAPSULE | ORAL | 0 refills | Status: DC

## 2019-02-01 NOTE — Telephone Encounter (Signed)
Spoke with the patient, he expressed understanding about taking his medication, 4 tablets, 1 hour before his dental procedure.

## 2019-02-01 NOTE — Telephone Encounter (Signed)
Spoke with the patient, he will take 4 tablets before each of his 3 dental procedures.

## 2019-02-16 ENCOUNTER — Telehealth: Payer: Self-pay | Admitting: Cardiology

## 2019-02-16 ENCOUNTER — Telehealth: Payer: Self-pay | Admitting: Gastroenterology

## 2019-02-16 NOTE — Telephone Encounter (Signed)
   Primary Cardiologist: Fransico Him, MD  Chart reviewed as part of pre-operative protocol coverage. Patient was contacted 02/16/2019 in reference to pre-operative risk assessment for pending colonoscpy.  FLEET HIGHAM was last seen on 09/03/2018 by me, Daune Perch, NP.  Since that day, [name] has done well. He is active and having no exertional chest discomfort or shortness of breath.   Therefore, based on ACC/AHA guidelines, the patient would be at acceptable risk for the planned procedure without further cardiovascular testing.   It is OK to hold aspirin if needed for the procedure.   I will route this recommendation to Dr. Havery Moros via Epic fax function and remove from pre-op pool.  Please call with questions.  Daune Perch, NP 02/16/2019, 12:58 PM

## 2019-02-16 NOTE — Telephone Encounter (Signed)
Dr. Radford Pax, this patient needs a colonoscopy. Can aspirin be held if needed?

## 2019-02-16 NOTE — Telephone Encounter (Signed)
Pt stated that he has clearance from cardiologist and would like to schedule colonoscopy.

## 2019-02-17 NOTE — Telephone Encounter (Signed)
Left message to call back  

## 2019-02-17 NOTE — Telephone Encounter (Signed)
Patient scheduled for Colonoscopy in [place] on 03/17/19 @9 :30am, patient knows to arrive at 8:30am. Pre-visit scheduled for 03/10/19 @ 10:00am ( In Person). Patient called and given above information and he is good with this

## 2019-03-10 ENCOUNTER — Ambulatory Visit (AMBULATORY_SURGERY_CENTER): Payer: Self-pay | Admitting: *Deleted

## 2019-03-10 ENCOUNTER — Encounter: Payer: Self-pay | Admitting: *Deleted

## 2019-03-10 ENCOUNTER — Other Ambulatory Visit: Payer: Self-pay

## 2019-03-10 ENCOUNTER — Telehealth: Payer: Self-pay | Admitting: Gastroenterology

## 2019-03-10 VITALS — Temp 97.5°F | Ht 67.0 in | Wt 145.4 lb

## 2019-03-10 DIAGNOSIS — Z1211 Encounter for screening for malignant neoplasm of colon: Secondary | ICD-10-CM

## 2019-03-10 MED ORDER — NA SULFATE-K SULFATE-MG SULF 17.5-3.13-1.6 GM/177ML PO SOLN: 1.0000 | Freq: Once | ORAL | 0 refills | Status: DC

## 2019-03-10 MED ORDER — PEG 3350-KCL-NA BICARB-NACL 420 G PO SOLR: 4000.0000 mL | Freq: Once | ORAL | 0 refills | Status: AC

## 2019-03-10 NOTE — Progress Notes (Signed)
No egg or soy allergy known to patient  No issues with past sedation with any surgeries  or procedures, no intubation problems  No diet pills per patient No home 02 use per patient  No blood thinners per patient  Pt denies issues with constipation  No A fib or A flutter  EMMI video sent to pt's e mail   Pt. Stated " the rectal bleeding and abdominal pain cleared up after I started eating better,i no longer have that problem, its been 10 years and it is time for me to have one."

## 2019-03-10 NOTE — Telephone Encounter (Signed)
Spoke with pharmacy. Suprep will be "hundreds of dollar" and Plenvu is over $100, per pharmacist. Patient is in donut hole. Golytely is covered at $2.00, pt notified. New instructions mailed to patient today-pt is aware. He states he is not able to afford the Suprep.

## 2019-03-10 NOTE — Telephone Encounter (Signed)
Pt's pharmacy reported that insurance does not cover Suprep; will cover Gavilyte or any of the PEG solns.

## 2019-03-12 ENCOUNTER — Encounter: Payer: Self-pay | Admitting: Gastroenterology

## 2019-03-16 ENCOUNTER — Telehealth: Payer: Self-pay | Admitting: Gastroenterology

## 2019-03-16 NOTE — Telephone Encounter (Signed)
Pt is scheduled for colon tomorrow and has questions regarding his prep.

## 2019-03-16 NOTE — Telephone Encounter (Signed)
Returned call and answered questions regarding acceptable food and drink today.

## 2019-03-16 NOTE — Telephone Encounter (Signed)

## 2019-03-17 ENCOUNTER — Other Ambulatory Visit: Payer: Self-pay

## 2019-03-17 ENCOUNTER — Encounter: Payer: Self-pay | Admitting: Gastroenterology

## 2019-03-17 ENCOUNTER — Ambulatory Visit (AMBULATORY_SURGERY_CENTER): Payer: Medicare Other | Admitting: Gastroenterology

## 2019-03-17 VITALS — BP 137/60 | HR 60 | Temp 97.3°F | Resp 14 | Ht 67.0 in | Wt 145.0 lb

## 2019-03-17 DIAGNOSIS — D120 Benign neoplasm of cecum: Secondary | ICD-10-CM

## 2019-03-17 DIAGNOSIS — R195 Other fecal abnormalities: Secondary | ICD-10-CM | POA: Diagnosis not present

## 2019-03-17 DIAGNOSIS — K625 Hemorrhage of anus and rectum: Secondary | ICD-10-CM

## 2019-03-17 DIAGNOSIS — K5730 Diverticulosis of large intestine without perforation or abscess without bleeding: Secondary | ICD-10-CM

## 2019-03-17 DIAGNOSIS — D123 Benign neoplasm of transverse colon: Secondary | ICD-10-CM | POA: Diagnosis not present

## 2019-03-17 DIAGNOSIS — K648 Other hemorrhoids: Secondary | ICD-10-CM

## 2019-03-17 DIAGNOSIS — D122 Benign neoplasm of ascending colon: Secondary | ICD-10-CM | POA: Diagnosis not present

## 2019-03-17 MED ORDER — SODIUM CHLORIDE 0.9 % IV SOLN: 500.0000 mL | Freq: Once | INTRAVENOUS | Status: DC

## 2019-03-17 NOTE — Progress Notes (Signed)
Report given to PACU, vss 

## 2019-03-17 NOTE — Progress Notes (Signed)
Called to room to assist during endoscopic procedure.  Patient ID and intended procedure confirmed with present staff. Received instructions for my participation in the procedure from the performing physician.  

## 2019-03-17 NOTE — Progress Notes (Signed)
Pt's states no medical or surgical changes since previsit or office visit. Covid screening and temp done by June. VS done by CW

## 2019-03-17 NOTE — Op Note (Signed)
[facility] Patient Name: [name] Procedure Date: 03/17/2019 9:29 AM MRN: [id number] Endoscopist: Remo Lipps P. Havery Moros , MD Age: 73 Referring MD:  Date of Birth: 05/26/1946 Gender: Male Account #: [id number] Procedure:                Colonoscopy Indications:              Rectal bleeding, heme positive stool Medicines:                Monitored Anesthesia Care Procedure:                Pre-Anesthesia Assessment:                           - Prior to the procedure, a History and Physical                            was performed, and patient medications and                            allergies were reviewed. The patient's tolerance of                            previous anesthesia was also reviewed. The risks                            and benefits of the procedure and the sedation                            options and risks were discussed with the patient.                            All questions were answered, and informed consent                            was obtained. Prior Anticoagulants: The patient has                            taken no previous anticoagulant or antiplatelet                            agents. ASA Grade Assessment: III - A patient with                            severe systemic disease. After reviewing the risks                            and benefits, the patient was deemed in                            satisfactory condition to undergo the procedure.                           After obtaining informed consent, the colonoscope  was passed under direct vision. Throughout the                            procedure, the patient's blood pressure, pulse, and                            oxygen saturations were monitored continuously. The                            Colonoscope was introduced through the anus and                            advanced to the the cecum, identified by                            appendiceal orifice and  ileocecal valve. The                            colonoscopy was performed without difficulty. The                            patient tolerated the procedure well. The quality                            of the bowel preparation was adequate. The                            ileocecal valve, appendiceal orifice, and rectum                            were photographed. Scope In: 9:42:06 AM Scope Out: 10:12:50 AM Scope Withdrawal Time: 0 hours 28 minutes 45 seconds  Total Procedure Duration: 0 hours 30 minutes 44 seconds  Findings:                 The perianal and digital rectal examinations were                            normal.                           Multiple small-mouthed diverticula were found in                            the entire colon.                           A 4 mm polyp was found in the cecum. The polyp was                            sessile. The polyp was removed with a cold snare.                            Resection and retrieval were complete.  Three flat and sessile polyps were found in the                            ascending colon. The polyps were 3 to 7 mm in size.                            These polyps were removed with a cold snare.                            Resection and retrieval were complete.                           A 4 mm polyp was found in the transverse colon. The                            polyp was sessile. The polyp was removed with a                            cold snare. Resection and retrieval were complete.                           Internal hemorrhoids were found during retroflexion.                           The exam was otherwise without abnormality. Complications:            No immediate complications. Estimated blood loss:                            Minimal. Estimated Blood Loss:     Estimated blood loss was minimal. Impression:               - Diverticulosis in the entire examined colon.                           -  One 4 mm polyp in the cecum, removed with a cold                            snare. Resected and retrieved.                           - Three 3 to 7 mm polyps in the ascending colon,                            removed with a cold snare. Resected and retrieved.                           - One 4 mm polyp in the transverse colon, removed                            with a cold snare. Resected and retrieved.                           -  Internal hemorrhoids.                           - The examination was otherwise normal.                           Suspect hemorrhoids are the cause of the patient's                            symptoms, which have improved since I last saw him. Recommendation:           - Patient has a contact number available for                            emergencies. The signs and symptoms of potential                            delayed complications were discussed with the                            patient. Return to normal activities tomorrow.                            Written discharge instructions were provided to the                            patient.                           - Resume previous diet.                           - Continue present medications.                           - Await pathology results.                           - Consideration for hemorrhoid banding if bleeding                            symptoms persist Steven P. Havery Moros, MD 03/17/2019 10:18:23 AM This report has been signed electronically.

## 2019-03-17 NOTE — Patient Instructions (Signed)
YOU HAD AN ENDOSCOPIC PROCEDURE TODAY AT [facility]:   Refer to the procedure report that was given to you for any specific questions about what was found during the examination.  If the procedure report does not answer your questions, please call your gastroenterologist to clarify.  If you requested that your care partner not be given the details of your procedure findings, then the procedure report has been included in a sealed envelope for you to review at your convenience later.  YOU SHOULD EXPECT: Some feelings of bloating in the abdomen. Passage of more gas than usual.  Walking can help get rid of the air that was put into your GI tract during the procedure and reduce the bloating. If you had a lower endoscopy (such as a colonoscopy or flexible sigmoidoscopy) you may notice spotting of blood in your stool or on the toilet paper. If you underwent a bowel prep for your procedure, you may not have a normal bowel movement for a few days.  Please Note:  You might notice some irritation and congestion in your nose or some drainage.  This is from the oxygen used during your procedure.  There is no need for concern and it should clear up in a day or so.  SYMPTOMS TO REPORT IMMEDIATELY:   Following lower endoscopy (colonoscopy or flexible sigmoidoscopy):  Excessive amounts of blood in the stool  Significant tenderness or worsening of abdominal pains  Swelling of the abdomen that is new, acute  Fever of 100F or higher   For urgent or emergent issues, a gastroenterologist can be reached at any hour by calling [phone].   DIET:  We do recommend a small meal at first, but then you may proceed to your regular diet.  Drink plenty of fluids but you should avoid alcoholic beverages for 24 hours.  MEDICATIONS: Continue present medications.  Please see handouts given to you by your recovery nurse.  Follow Up: Consider following up with Dr. Havery Moros in his office for hemorrhoid  banding if your bleeding symptoms persist.  ACTIVITY:  You should plan to take it easy for the rest of today and you should NOT DRIVE or use heavy machinery until tomorrow (because of the sedation medicines used during the test).    FOLLOW UP: Our staff will call the number listed on your records 48-72 hours following your procedure to check on you and address any questions or concerns that you may have regarding the information given to you following your procedure. If we do not reach you, we will leave a message.  We will attempt to reach you two times.  During this call, we will ask if you have developed any symptoms of COVID 19. If you develop any symptoms (ie: fever, flu-like symptoms, shortness of breath, cough etc.) before then, please call [phone].  If you test positive for Covid 19 in the 2 weeks post procedure, please call and report this information to [place].    If any biopsies were taken you will be contacted by phone or by letter within the next 1-3 weeks.  Please call us at [phone] if you have not heard about the biopsies in 3 weeks.   Thank you for allowing [place] to provide for your healthcare needs today.   SIGNATURES/CONFIDENTIALITY: You and/or your care partner have signed paperwork which will be entered into your electronic medical record.  These signatures attest to the fact that that the information above on your After Visit Summary  has been reviewed and is understood.  Full responsibility of the confidentiality of this discharge information lies with you and/or your care-partner. 

## 2019-03-19 ENCOUNTER — Encounter: Payer: Medicare Other | Admitting: Gastroenterology

## 2019-03-19 ENCOUNTER — Telehealth: Payer: Self-pay

## 2019-03-19 NOTE — Telephone Encounter (Signed)
  Follow up Call-  Call back number 03/17/2019  Post procedure Call Back phone  # [phone]  Permission to leave phone message Yes  Some recent data might be hidden     Patient questions:  Do you have a fever, pain , or abdominal swelling? No. Pain Score  0 *  Have you tolerated food without any problems? Yes.    Have you been able to return to your normal activities? Yes.    Do you have any questions about your discharge instructions: Diet   No. Medications  No. Follow up visit  No.  Do you have questions or concerns about your Care? No.  Actions: * If pain score is 4 or above: No action needed, pain <4.  1. Have you developed a fever since your procedure? no  2.   Have you had an respiratory symptoms (SOB or cough) since your procedure? no  3.   Have you tested positive for COVID 19 since your procedure *no  4.   Have you had any family members/close contacts diagnosed with the COVID 19 since your procedure?  no   If yes to any of these questions please route to Joylene Mandell, RN and Alphonsa Gin, Therapist, sports.

## 2019-03-19 NOTE — Telephone Encounter (Signed)
NO ANSWER, MESSAGE LEFT FOR PATIENT. 

## 2019-04-04 ENCOUNTER — Other Ambulatory Visit: Payer: Self-pay | Admitting: Cardiology

## 2019-04-21 ENCOUNTER — Other Ambulatory Visit: Payer: Self-pay | Admitting: Neurology

## 2019-04-21 DIAGNOSIS — F5104 Psychophysiologic insomnia: Secondary | ICD-10-CM

## 2019-04-21 MED ORDER — ALPRAZOLAM ER 1 MG PO TB24: 1.0000 mg | ORAL_TABLET | Freq: Every day | ORAL | 5 refills | Status: DC

## 2019-06-09 ENCOUNTER — Other Ambulatory Visit: Payer: Self-pay

## 2019-06-09 ENCOUNTER — Telehealth (INDEPENDENT_AMBULATORY_CARE_PROVIDER_SITE_OTHER): Payer: Medicare Other | Admitting: Cardiology

## 2019-06-09 ENCOUNTER — Telehealth: Payer: Self-pay

## 2019-06-09 VITALS — Ht 66.0 in | Wt 145.0 lb

## 2019-06-09 DIAGNOSIS — E7800 Pure hypercholesterolemia, unspecified: Secondary | ICD-10-CM

## 2019-06-09 DIAGNOSIS — I10 Essential (primary) hypertension: Secondary | ICD-10-CM | POA: Diagnosis not present

## 2019-06-09 DIAGNOSIS — I4892 Unspecified atrial flutter: Secondary | ICD-10-CM

## 2019-06-09 DIAGNOSIS — I2510 Atherosclerotic heart disease of native coronary artery without angina pectoris: Secondary | ICD-10-CM | POA: Diagnosis not present

## 2019-06-09 DIAGNOSIS — I7121 Aneurysm of the ascending aorta, without rupture: Secondary | ICD-10-CM

## 2019-06-09 DIAGNOSIS — I351 Nonrheumatic aortic (valve) insufficiency: Secondary | ICD-10-CM

## 2019-06-09 DIAGNOSIS — I712 Thoracic aortic aneurysm, without rupture: Secondary | ICD-10-CM | POA: Diagnosis not present

## 2019-06-09 MED ORDER — APIXABAN 5 MG PO TABS: 5.0000 mg | ORAL_TABLET | Freq: Two times a day (BID) | ORAL | 3 refills | Status: DC

## 2019-06-09 NOTE — Progress Notes (Signed)
Virtual Visit via Telephone Note   This visit type was conducted due to national recommendations for restrictions regarding the COVID-19 Pandemic (e.g. social distancing) in an effort to limit this patient's exposure and mitigate transmission in our community.  Due to his co-morbid illnesses, this patient is at least at moderate risk for complications without adequate follow up.  This format is felt to be most appropriate for this patient at this time.  All issues noted in this document were discussed and addressed.  A limited physical exam was performed with this format.  Please refer to the patient's chart for his consent to telehealth for [facility].   Evaluation Performed:  Follow-up visit  This visit type was conducted due to national recommendations for restrictions regarding the COVID-19 Pandemic (e.g. social distancing).  This format is felt to be most appropriate for this patient at this time.  All issues noted in this document were discussed and addressed.  No physical exam was performed (except for noted visual exam findings with Video Visits).  Please refer to the patient's chart (MyChart message for video visits and phone note for telephone visits) for the patient's consent to telehealth for [facility].  Date:  06/09/2019   ID:  JAUAN [name], DOB 13-Mar-1946, MRN [id number]  Patient Location:  Home  Provider location:   [place]  PCP:  Burman Freestone, MD  Cardiologist:  Fransico Him, MD  Electrophysiologist:  None   Chief Complaint:  CAD, AI  History of Present Illness:    [name] is a 73 y.o. male who presents via audio/video conferencing for a telehealth visit today.    [name] is a 73 y.o. male with a hx of CAD with CABG in 1998, bicuspid AV with moderate to severe AI and dilated aortic root measuring 4.8cm in diameter by TEE 12/2014  followed by Dr. Clementeen Graham at [facility]. He has mild carotid artery stenosis on dopplers. He has a history of  dyslipidemia. He has a history of mild renal insufficiency felt secondary to hypertensive nephrosclerosis.  Since I saw him last he was admitted to [facility] late January with CP and ruled in for NSTEMI with trop 3.31.  Cath showed occluded left radial graft to the RCA. He had a patent LIMA to the LAD with left to right collaterals and moderate nondominant circumflex disease. His LVEDP was normal.Medical therapy was recommended.  He was recently seen in the ER at [facility] ER complaining of palpitations.  He was found to be in aflutter with RVR and was cardioverted to NSR.  He did not want pursue anticoagulation until he talked with me.  He is here today for followup and is doing well.  He denies any chest pain or pressure, SOB, DOE, PND, orthopnea, LE edema, dizziness, palpitations or syncope. He is compliant with his meds and is tolerating meds with no SE.    The patient does not have symptoms concerning for COVID-19 infection (fever, chills, cough, or new shortness of breath).    Prior CV studies:   The following studies were reviewed today:  Cardiac cath  Past Medical History:  Diagnosis Date  . Anal fissure   . Arthritis   . Bicuspid aortic valve    with mild to moderate AR by echo 10/2017  . BPH (benign prostatic hyperplasia)   . CAD (coronary artery disease)    s/p CABG [place]  . Cataract    bilateral-removed  . Depression   .  Dilated aortic root (HCC)    32mm aortic root and 79mm ascending aorta by echo 10/2017  . History of blood transfusion 1998   "due to cardiac bypass surgery"  . Hyperlipidemia   . Hypertension   . Hypogonadism in male   . Kidney stones   . Migraine   . Myocardial infarction ([place])   . Retinal hemorrhage 11/04/2014  . Sleep apnea    c pap  . Sleep apnea with use of continuous positive airway pressure (CPAP)    Past Surgical History:  Procedure Laterality Date  . cataracts    . COLONOSCOPY    . CORONARY ARTERY BYPASS GRAFT   1998  . LEFT HEART CATH AND CORONARY ANGIOGRAPHY N/A 08/24/2018   Procedure: LEFT HEART CATH AND CORONARY ANGIOGRAPHY;  Surgeon: Lorretta Harp, MD;  Location: [facility];  Service: Cardiovascular;  Laterality: N/A;  . TEE WITHOUT CARDIOVERSION N/A 01/19/2015   Procedure: TRANSESOPHAGEAL ECHOCARDIOGRAM (TEE);  Surgeon: Sueanne Margarita, MD;  Location: [facility] ENDOSCOPY;  Service: Cardiovascular;  Laterality: N/A;  . [place]   pt denies     Current Meds  Medication Sig  . ALPRAZolam (ALPRAZOLAM XR) 1 MG 24 hr tablet Take 1 tablet (1 mg total) by mouth daily.  Marland Kitchen amLODipine (NORVASC) 10 MG tablet TAKE ONE TABLET BY MOUTH DAILY  . amoxicillin (AMOXIL) 500 MG capsule Take 4 tablets, 2 grams, 1 hour before each of your three dental procedures.  . Ascorbic Acid (VITAMIN C) 1000 MG tablet Take 1,000 mg by mouth 2 (two) times daily.  Marland Kitchen aspirin EC 81 MG tablet Take 81 mg by mouth daily.  Marland Kitchen aspirin-acetaminophen-caffeine (EXCEDRIN EXTRA STRENGTH) 250-250-65 MG tablet Take 2 tablets by mouth every 6 (six) hours as needed for headache.  . butalbital-aspirin-caffeine-codeine (FIORINAL WITH CODEINE) 50-325-40-30 MG capsule TAKE ONE CAPSULE BY MOUTH EVERY 4 HOURS AS NEEDED FOR PAIN  . diazepam (VALIUM) 5 MG tablet Take 2.5-5 mg by mouth daily as needed for anxiety.  Marland Kitchen ibuprofen (ADVIL,MOTRIN) 200 MG tablet Take 200-400 mg by mouth daily as needed (for arthritis in the feet).  Marland Kitchen losartan (COZAAR) 25 MG tablet TAKE ONE TABLET BY MOUTH DAILY  . metoprolol succinate (TOPROL-XL) 100 MG 24 hr tablet TAKE ONE TABLET BY MOUTH DAILY  . niacin 500 MG tablet Take 500 mg by mouth at bedtime.  . nitroGLYCERIN (NITROSTAT) 0.4 MG SL tablet Place 1 tablet (0.4 mg total) under the tongue every 5 (five) minutes x 3 doses as needed for chest pain.  . rosuvastatin (CRESTOR) 20 MG tablet Take by mouth.  Marland Kitchen Specialty Vitamins Products (CENTRUM SPECIALIST ENERGY) TABS Take 1 tablet by mouth daily with  breakfast.  . Testosterone (ANDROGEL) 40.5 MG/2.5GM (1.62%) GEL Place 1 application onto the skin See admin instructions. Apply 1 pump to each shoulder once a day  . TURMERIC PO Take 1 capsule by mouth daily with breakfast.   . vitamin E 400 UNIT capsule Take 400 Units by mouth at bedtime.   Marland Kitchen zonisamide (ZONEGRAN) 25 MG capsule TAKE TWO CAPSULES BY MOUTH DAILY   Current Facility-Administered Medications for the 06/09/19 encounter (Telemedicine) with Sueanne Margarita, MD  Medication  . 0.9 %  sodium chloride infusion  . 0.9 %  sodium chloride infusion     Allergies:   Mushroom extract complex   Social History   Tobacco Use  . Smoking status: Former Smoker    Years: 20.00    Quit date: 07/22/1986  Years since quitting: 32.9  . Smokeless tobacco: Never Used  Substance Use Topics  . Alcohol use: Yes    Comment: 0-1 daily  . Drug use: No     Family Hx: The patient's family history includes Arthritis in his father and mother; Diabetes in his father and mother; Heart disease (age of onset: 51) in his father; Hyperlipidemia in his father and mother; Hypertension in his father; Kidney disease in his paternal grandfather; Stroke (age of onset: 52) in his father. There is no history of Colon cancer, Colon polyps, Esophageal cancer, Stomach cancer, or Rectal cancer.  ROS:   Please see the history of present illness.     All other systems reviewed and are negative.   Labs/Other Tests and Data Reviewed:    Recent Labs: 08/21/2018: ALT 34; Magnesium 2.0 09/03/2018: BUN 22; Creatinine, Ser 1.38; Potassium 4.6; Sodium 142 09/21/2018: Hemoglobin 13.6; Platelets 248.0   Recent Lipid Panel Lab Results  Component Value Date/Time   CHOL 87 08/22/2018 06:45 AM   CHOL 116 12/08/2017 08:39 AM   TRIG 58 08/22/2018 06:45 AM   HDL 31 (L) 08/22/2018 06:45 AM   HDL 37 (L) 12/08/2017 08:39 AM   CHOLHDL 2.8 08/22/2018 06:45 AM   LDLCALC 44 08/22/2018 06:45 AM   LDLCALC 55 12/08/2017 08:39 AM     Wt Readings from Last 3 Encounters:  06/09/19 145 lb (65.8 kg)  03/17/19 145 lb (65.8 kg)  03/10/19 145 lb 6.4 oz (66 kg)     Objective:    Vital Signs:  Ht 5\' 6"  (1.676 m)   Wt 145 lb (65.8 kg)   BMI 23.40 kg/m    ASSESSMENT & PLAN:    1.  ASCAD -s/p remote CABG 1998 -s/p NSTEMI 08/21/2018 with cath showing occluded left radial graft to the RCA. He had a patent LIMA to the LAD with left to right collaterals and moderate nondominant circumflex disease -denies any anginal sx -continue medical therapy with statin and BB.   -stop ASA as we are starting DOAC for aflutter  2.  HTN -BP controlled on exam -continue amlodipine 10mg  daily, Toprol XL 100mg  daily and Losartan 25mg  daily. -check BMET  3.  HLD -LDL goal < 70 -LDL 57 in February.  Will repeat FLP and ALT -continue Crestor 20mg  daily   4.  Ascending aortic aneurysm -recent chest CTA at [facility] showed 4.7cm ascending aortic aneurysm which is stable -followed by Dr. Clementeen Graham at [facility]  5.  Bicuspid AV -2D echo 08/2018 showed mild to moderate AR -repeat echo 08/2019  6.  Atrial Flutter with RVR -he was cardioverted and has not had any further palpitations -his CHADS2VASC score is 3 -start Elquis 5mg  BID -continue Toprol for suppression of afib -Hbg was 14.3 and creatinine 1.54 on 04/29/2019 -if he has any further palpitations I will refer him to the afib clinic  COVID-19 Education: The signs and symptoms of COVID-19 were discussed with the patient and how to seek care for testing (follow up with PCP or arrange E-visit).  The importance of social distancing was discussed today.  Patient Risk:   After full review of this patient's clinical status, I feel that they are at least moderate risk at this time.  Time:   Today, I have spent 20 minutes directly with the patient on telemedicine discussing medical problems including CAD, HTN, HLD, aortic aneurysm.  We also reviewed the symptoms of COVID 19 and the ways to protect against  contracting the virus with telehealth  technology.  I spent an additional 5 minutes reviewing patient's chart including cath and labs.  Medication Adjustments/Labs and Tests Ordered: Current medicines are reviewed at length with the patient today.  Concerns regarding medicines are outlined above.  Tests Ordered: No orders of the defined types were placed in this encounter.  Medication Changes: No orders of the defined types were placed in this encounter.   Disposition:  Follow up in 6 month(s) virtual with PA  Signed, Fransico Him, MD  06/09/2019 8:57 AM    [place]

## 2019-06-09 NOTE — Telephone Encounter (Signed)
Called to get patient ready for virtual visit today. Consent obtained.    Virtual Visit Pre-Appointment Phone Call Confirm consent - "In the setting of the current Covid19 crisis, you are scheduled for a (phone or video) visit with your provider on (date) at (time).  Just as we do with many in-office visits, in order for you to participate in this visit, we must obtain consent.  If you'd like, I can send this to your mychart (if signed up) or email for you to review.  Otherwise, I can obtain your verbal consent now.  All virtual visits are billed to your insurance company just like a normal visit would be.  By agreeing to a virtual visit, we'd like you to understand that the technology does not allow for your provider to perform an examination, and thus may limit your provider's ability to fully assess your condition. If your provider identifies any concerns that need to be evaluated in person, we will make arrangements to do so.  Finally, though the technology is pretty good, we cannot assure that it will always work on either your or our end, and in the setting of a video visit, we may have to convert it to a phone-only visit.  In either situation, we cannot ensure that we have a secure connection.  Are you willing to proceed?" STAFF: Did the patient verbally acknowledge consent to telehealth visit? Document YES/NO here: YES    TELEPHONE CALL NOTE  [name] has been deemed a candidate for a follow-up tele-health visit to limit community exposure during the Covid-19 pandemic. I spoke with the patient via phone to ensure availability of phone/video source, confirm preferred email & phone number, and discuss instructions and expectations.  I reminded [name] to be prepared with any vital sign and/or heart rhythm information that could potentially be obtained via home monitoring, at the time of his visit. I reminded [name] to expect a phone call prior to his visit.  Theodoro Parma,  RN 06/09/2019 8:51 AM     FULL LENGTH CONSENT FOR TELE-HEALTH VISIT   I hereby voluntarily request, consent and authorize [facility] and its employed or contracted physicians, physician assistants, nurse practitioners or other licensed health care professionals (the Practitioner), to provide me with telemedicine health care services (the "Services") as deemed necessary by the treating Practitioner. I acknowledge and consent to receive the Services by the Practitioner via telemedicine. I understand that the telemedicine visit will involve communicating with the Practitioner through live audiovisual communication technology and the disclosure of certain medical information by electronic transmission. I acknowledge that I have been given the opportunity to request an in-person assessment or other available alternative prior to the telemedicine visit and am voluntarily participating in the telemedicine visit.  I understand that I have the right to withhold or withdraw my consent to the use of telemedicine in the course of my care at any time, without affecting my right to future care or treatment, and that the Practitioner or I may terminate the telemedicine visit at any time. I understand that I have the right to inspect all information obtained and/or recorded in the course of the telemedicine visit and may receive copies of available information for a reasonable fee.  I understand that some of the potential risks of receiving the Services via telemedicine include:  Marland Kitchen Delay or interruption in medical evaluation due to technological equipment failure or disruption; . Information transmitted may not be sufficient (e.g. poor  resolution of images) to allow for appropriate medical decision making by the Practitioner; and/or  . In rare instances, security protocols could fail, causing a breach of personal health information.  Furthermore, I acknowledge that it is my responsibility to provide information  about my medical history, conditions and care that is complete and accurate to the best of my ability. I acknowledge that Practitioner's advice, recommendations, and/or decision may be based on factors not within their control, such as incomplete or inaccurate data provided by me or distortions of diagnostic images or specimens that may result from electronic transmissions. I understand that the practice of medicine is not an exact science and that Practitioner makes no warranties or guarantees regarding treatment outcomes. I acknowledge that I will receive a copy of this consent concurrently upon execution via email to the email address I last provided but may also request a printed copy by calling the office of [place].    I understand that my insurance will be billed for this visit.   I have read or had this consent read to me. . I understand the contents of this consent, which adequately explains the benefits and risks of the Services being provided via telemedicine.  . I have been provided ample opportunity to ask questions regarding this consent and the Services and have had my questions answered to my satisfaction. . I give my informed consent for the services to be provided through the use of telemedicine in my medical care  By participating in this telemedicine visit I agree to the above.

## 2019-06-09 NOTE — Patient Instructions (Signed)
Medication Instructions:  1) STOP ASPIRIN 2) START ELIQUIS 6 mg twice daily *If you need a refill on your cardiac medications before your next appointment, please call your pharmacy*  Lab Work: Please come in for FASTING blood work when you return for your echocardiogram. If you have labs (blood work) drawn today and your tests are completely normal, you will receive your results only by: Marland Kitchen MyChart Message (if you have MyChart) OR . A paper copy in the mail If you have any lab test that is abnormal or we need to change your treatment, we will call you to review the results.  Testing/Procedures: Your physician has requested that you have an echocardiogram. Echocardiography is a painless test that uses sound waves to create images of your heart. It provides your doctor with information about the size and shape of your heart and how well your heart's chambers and valves are working. This procedure takes approximately one hour. There are no restrictions for this procedure.  Follow-Up: At [facility], you and your health needs are our priority.  As part of our continuing mission to provide you with exceptional heart care, we have created designated Provider Care Teams.  These Care Teams include your primary Cardiologist (physician) and Advanced Practice Providers (APPs -  Physician Assistants and Nurse Practitioners) who all work together to provide you with the care you need, when you need it. Your next appointment:   6 month(s) The format for your next appointment:   In Person Provider:   Melina Copa, PA-C

## 2019-06-10 MED ORDER — APIXABAN 5 MG PO TABS: 5.0000 mg | ORAL_TABLET | Freq: Two times a day (BID) | ORAL | 3 refills | Status: DC

## 2019-06-10 NOTE — Telephone Encounter (Addendum)
Called patient and confirmed that I will send a fax with Eliquis Rx to his provider at the [place].  Patient states that he has been on testosterone since 2000 and his levels have still always been low. He is seeing his urologist in December and will most likely have labs drawn then to check his levels.  Per Dr. Radford Pax it is okay for him to continue Andro-Gel. Letter will also be faxed.

## 2019-06-21 ENCOUNTER — Telehealth: Payer: Self-pay

## 2019-06-21 ENCOUNTER — Encounter: Payer: Self-pay | Admitting: Cardiology

## 2019-06-21 NOTE — Telephone Encounter (Signed)
I spoke to the patient who will further discuss the Eliquis prescription and lab work with Dr Radford Pax in the AM appointment.  He thanked me for the call and verbalized understanding.

## 2019-06-21 NOTE — Telephone Encounter (Signed)
No message

## 2019-06-22 ENCOUNTER — Telehealth: Payer: Self-pay | Admitting: *Deleted

## 2019-06-22 ENCOUNTER — Telehealth: Payer: Self-pay | Admitting: Radiology

## 2019-06-22 ENCOUNTER — Other Ambulatory Visit: Payer: Self-pay

## 2019-06-22 ENCOUNTER — Ambulatory Visit (INDEPENDENT_AMBULATORY_CARE_PROVIDER_SITE_OTHER): Payer: Medicare Other | Admitting: Cardiology

## 2019-06-22 VITALS — BP 136/58 | HR 61 | Ht 66.0 in | Wt 142.0 lb

## 2019-06-22 DIAGNOSIS — I2510 Atherosclerotic heart disease of native coronary artery without angina pectoris: Secondary | ICD-10-CM | POA: Diagnosis not present

## 2019-06-22 DIAGNOSIS — Q2381 Bicuspid aortic valve: Secondary | ICD-10-CM

## 2019-06-22 DIAGNOSIS — I4892 Unspecified atrial flutter: Secondary | ICD-10-CM

## 2019-06-22 DIAGNOSIS — E7800 Pure hypercholesterolemia, unspecified: Secondary | ICD-10-CM

## 2019-06-22 DIAGNOSIS — I10 Essential (primary) hypertension: Secondary | ICD-10-CM

## 2019-06-22 DIAGNOSIS — Q231 Congenital insufficiency of aortic valve: Secondary | ICD-10-CM

## 2019-06-22 DIAGNOSIS — I712 Thoracic aortic aneurysm, without rupture: Secondary | ICD-10-CM | POA: Diagnosis not present

## 2019-06-22 DIAGNOSIS — I493 Ventricular premature depolarization: Secondary | ICD-10-CM

## 2019-06-22 DIAGNOSIS — I7121 Aneurysm of the ascending aorta, without rupture: Secondary | ICD-10-CM

## 2019-06-22 LAB — LIPID PANEL
Chol/HDL Ratio: 1.9 ratio (ref 0.0–5.0)
Cholesterol, Total: 76 mg/dL — ABNORMAL LOW (ref 100–199)
HDL: 40 mg/dL (ref >39)
LDL Chol Calc (NIH): 19 mg/dL (ref 0–99)
Triglycerides: 79 mg/dL (ref 0–149)
VLDL Cholesterol Cal: 17 mg/dL (ref 5–40)

## 2019-06-22 LAB — ALT: ALT: 27 IU/L (ref 0–44)

## 2019-06-22 MED ORDER — APIXABAN 5 MG PO TABS: 5.0000 mg | ORAL_TABLET | Freq: Two times a day (BID) | ORAL | 3 refills | Status: DC

## 2019-06-22 NOTE — Telephone Encounter (Signed)
Enrolled patient for a 30 day [employer] Event monitor to be mailed. Brief instructions were gone over with patient and he knows to expect the monitor to arrive in 3-4 days.

## 2019-06-22 NOTE — Telephone Encounter (Signed)
Sent myChart message to call office.

## 2019-06-22 NOTE — Telephone Encounter (Signed)
Left detailed message on self-identified VM that per Dr. Radford Pax, the patient should be virtual if a visit is needed, and that I wanted to discuss the right sided CP, reason for scheduling the visit further with him, since it was added yesterday and he was just seen two weeks ago.  Asked him to call back to further discuss.

## 2019-06-22 NOTE — Progress Notes (Signed)
Cardiology Office Note:    Date:  06/23/2019   ID:  [name], DOB 01-06-1946, MRN [id number]  PCP:  Drew Freestone, MD  Cardiologist:  Drew Him, MD    Referring MD: Drew Freestone, MD   Chief Complaint  Patient presents with  . Coronary Artery Disease  . Chest Pain  . Hyperlipidemia    History of Present Illness:    [name] is a 73 y.o. male with a hx of  CAD with CABG in 1998,bicuspid AV with moderate to severe AI and dilated aortic root measuring 4.8cm in diameter by TEE 12/2014 followed by Dr. Clementeen [name] at [facility]. He has mild carotid artery stenosis on dopplers. He has a history of dyslipidemia. He has a history of mild renal insufficiency felt secondary to hypertensive nephrosclerosis.  He was admitted to [facility] late January with CP and ruled in for NSTEMI with trop 3.31.  Cath showed occluded left radial graft to the RCA. He had a patent LIMA to the LAD with left to right collaterals and moderate nondominant circumflex disease. His LVEDP was normal.Medical therapywas recommended.  He was seen in ER at [facility] complaining of palpitations.  He was found to be in aflutter with RVR and was cardioverted to NSR.  He did not want pursue anticoagulation until he talked with me.  He was started on Eliquis and referred to afib clinic.  He made an appt today for evaluation of chest pain. He tells me that he lost a very close friend to [name] recently.  He became very tearful talking about it.  Since then he has been under a great deal of emotional stress and has been experiencing CP.  The pain is somewhat atypical and is on the right side of his chest.  It is sharp and not like the pain he had with his MI.  He thinks it is stress related.  He also has been having skipping of his heart beat.  He denies any DOE, PND, orthopnea, LE edema, dizziness or syncope.   Past Medical History:  Diagnosis Date  . Anal fissure   . Arthritis   . Bicuspid aortic valve    with mild to moderate AR by echo 10/2017  . BPH (benign prostatic hyperplasia)   . CAD (coronary artery disease)    s/p CABG [place]  . Cataract    bilateral-removed  . Depression   . Dilated aortic root (HCC)    62mm aortic root and 50mm ascending aorta by echo 10/2017  . History of blood transfusion 1998   "due to cardiac bypass surgery"  . Hyperlipidemia   . Hypertension   . Hypogonadism in male   . Kidney stones   . Migraine   . Myocardial infarction ([place])   . Retinal hemorrhage 11/04/2014  . Sleep apnea with use of continuous positive airway pressure (CPAP)     Past Surgical History:  Procedure Laterality Date  . cataracts    . COLONOSCOPY    . CORONARY ARTERY BYPASS GRAFT  1998  . LEFT HEART CATH AND CORONARY ANGIOGRAPHY N/A 08/24/2018   Procedure: LEFT HEART CATH AND CORONARY ANGIOGRAPHY;  Surgeon: Drew Harp, MD;  Location: [place] [facility];  Service: Cardiovascular;  Laterality: N/A;  . TEE WITHOUT CARDIOVERSION N/A 01/19/2015   Procedure: TRANSESOPHAGEAL ECHOCARDIOGRAM (TEE);  Surgeon: Drew Margarita, MD;  Location: [place];  Service: Cardiovascular;  Laterality: N/A;  . [place]  pt denies    Current Medications: Current Meds  Medication Sig  . ALPRAZolam (ALPRAZOLAM XR) 1 MG 24 hr tablet Take 1 tablet (1 mg total) by mouth daily.  Marland Kitchen amLODipine (NORVASC) 10 MG tablet TAKE ONE TABLET BY MOUTH DAILY  . amoxicillin (AMOXIL) 500 MG capsule Take 4 tablets, 2 grams, 1 hour before each of your three dental procedures.  Marland Kitchen apixaban (ELIQUIS) 5 MG TABS tablet Take 1 tablet (5 mg total) by mouth 2 (two) times daily.  . Ascorbic Acid (VITAMIN C) 1000 MG tablet Take 1,000 mg by mouth 2 (two) times daily.  Marland Kitchen aspirin-acetaminophen-caffeine (EXCEDRIN EXTRA STRENGTH) 250-250-65 MG tablet Take 2 tablets by mouth every 6 (six) hours as needed for headache.  . butalbital-aspirin-caffeine-codeine (FIORINAL WITH CODEINE) 50-325-40-30 MG capsule  TAKE ONE CAPSULE BY MOUTH EVERY 4 HOURS AS NEEDED FOR PAIN  . diazepam (VALIUM) 5 MG tablet Take 2.5-5 mg by mouth daily as needed for anxiety.  Marland Kitchen ibuprofen (ADVIL,MOTRIN) 200 MG tablet Take 200-400 mg by mouth daily as needed (for arthritis in the feet).  Marland Kitchen losartan (COZAAR) 25 MG tablet TAKE ONE TABLET BY MOUTH DAILY  . metoprolol succinate (TOPROL-XL) 100 MG 24 hr tablet TAKE ONE TABLET BY MOUTH DAILY  . niacin 500 MG tablet Take 500 mg by mouth at bedtime.  . nitroGLYCERIN (NITROSTAT) 0.4 MG SL tablet Place 1 tablet (0.4 mg total) under the tongue every 5 (five) minutes x 3 doses as needed for chest pain.  . rosuvastatin (CRESTOR) 20 MG tablet Take by mouth.  Marland Kitchen Specialty Vitamins Products (CENTRUM SPECIALIST ENERGY) TABS Take 1 tablet by mouth daily with breakfast.  . Testosterone (ANDROGEL) 40.5 MG/2.5GM (1.62%) GEL Place 1 application onto the skin See admin instructions. Apply 1 pump to each shoulder once a day  . TURMERIC PO Take 1 capsule by mouth daily with breakfast.   . vitamin E 400 UNIT capsule Take 400 Units by mouth at bedtime.   Marland Kitchen zonisamide (ZONEGRAN) 25 MG capsule TAKE TWO CAPSULES BY MOUTH DAILY  . [DISCONTINUED] apixaban (ELIQUIS) 5 MG TABS tablet Take 1 tablet (5 mg total) by mouth 2 (two) times daily.   Current Facility-Administered Medications for the 06/22/19 encounter (Office Visit) with Drew Margarita, MD  Medication  . 0.9 %  sodium chloride infusion  . 0.9 %  sodium chloride infusion     Allergies:   Mushroom extract complex   Social History   Socioeconomic History  . Marital status: Divorced    Spouse name: Not on file  . Number of children: 0  . Years of education: Not on file  . Highest education level: Not on file  Occupational History  . Occupation: retired  Scientific laboratory technician  . Financial resource strain: Not on file  . Food insecurity    Worry: Not on file    Inability: Not on file  . Transportation needs    Medical: Not on file    Non-medical:  Not on file  Tobacco Use  . Smoking status: Former Smoker    Years: 20.00    Quit date: 07/22/1986    Years since quitting: 32.9  . Smokeless tobacco: Never Used  Substance and Sexual Activity  . Alcohol use: Yes    Comment: 0-1 daily  . Drug use: No  . Sexual activity: Not on file  Lifestyle  . Physical activity    Days per week: Not on file    Minutes per session: Not on file  . Stress:  Not on file  Relationships  . Social Herbalist on phone: Not on file    Gets together: Not on file    Attends religious service: Not on file    Active member of club or organization: Not on file    Attends meetings of clubs or organizations: Not on file    Relationship status: Not on file  Other Topics Concern  . Not on file  Social History Narrative   Divorced   Secondary school teacher- retired   No children   Has a Neurosurgeon named [name]   Enjoys outdoor activities- hiking, swimming, Control and instrumentation engineer, baseball, writing, reading, cooking     Family History: The patient's family history includes Arthritis in his father and mother; Diabetes in his father and mother; Heart disease (age of onset: 45) in his father; Hyperlipidemia in his father and mother; Hypertension in his father; Kidney disease in his paternal grandfather; Stroke (age of onset: 8) in his father. There is no history of Colon cancer, Colon polyps, Esophageal cancer, Stomach cancer, or Rectal cancer.  ROS:   Please see the history of present illness.    ROS  All other systems reviewed and negative.   EKGs/Labs/Other Studies Reviewed:    The following studies were reviewed today:   EKG:  EKG is not ordered today.   Recent Labs: 08/21/2018: Magnesium 2.0 09/03/2018: BUN 22; Creatinine, Ser 1.38; Potassium 4.6; Sodium 142 09/21/2018: Hemoglobin 13.6; Platelets 248.0 06/22/2019: ALT 27   Recent Lipid Panel    Component Value Date/Time   CHOL 76 (L) 06/22/2019 1014   TRIG 79 06/22/2019 1014   HDL 40 06/22/2019 1014   CHOLHDL 1.9  06/22/2019 1014   CHOLHDL 2.8 08/22/2018 0645   VLDL 12 08/22/2018 0645   LDLCALC 19 06/22/2019 1014    Physical Exam:    VS:  BP (!) 136/58   Pulse 61   Ht 5\' 6"  (1.676 m)   Wt 142 lb (64.4 kg)   BMI 22.92 kg/m     Wt Readings from Last 3 Encounters:  06/22/19 142 lb (64.4 kg)  06/09/19 145 lb (65.8 kg)  03/17/19 145 lb (65.8 kg)     GEN:  Well nourished, well developed in no acute distress HEENT: Normal NECK: No JVD; No carotid bruits LYMPHATICS: No lymphadenopathy CARDIAC: RRR, no murmurs, rubs, gallops RESPIRATORY:  Clear to auscultation without rales, wheezing or rhonchi  ABDOMEN: Soft, non-tender, non-distended MUSCULOSKELETAL:  No edema; No deformity  SKIN: Warm and dry NEUROLOGIC:  Alert and oriented x 3 PSYCHIATRIC:  Normal affect   ASSESSMENT:    1. Coronary artery disease involving native coronary artery of native heart without angina pectoris   2. Essential hypertension   3. Pure hypercholesterolemia   4. Ascending aortic aneurysm ([place])   5. Bicuspid aortic valve   6. Atrial flutter, unspecified type ([place])   7. PVC's (premature ventricular contractions)    PLAN:    In order of problems listed above:  1.  ASCAD -s/p remote CABG 1998 -s/p NSTEMI 08/21/2018 with cath showing occluded left radial graft to the RCA. He had a patent LIMA to the LAD with left to right collaterals and moderate nondominant circumflex disease -medical management was recommended after cath -now with atypical CP that is sharp and right sided that started after the death of a close friend. -pain is not like what he has had with his MI in the past and I suspect that this is related to stress - I  reassured [name] that his cath in  08/2018 with patent LIMA to LAD and patent LAD distal to LIMA insertion, 50% OM and occluded SVG to RCA with L>R collaterals. -recommended starting Imdur 15mg  daily -followup with PA in 2-3 weeks  2.  HTN -BP controlled -continue amlodipine 10mg  daily,  Losartan 25mg  daily, Toprol XL 100mg  daily  3.  HLD -LDL goal < 70 -check FLP and ALT -continue Crestor 20mg  daily  4.  Ascending aortic aneurysm -recent chest CTA at [facility] showed 4.7cm ascending aortic aneurysm which is stable -followed by Dr. Clementeen [name] at [facility] -continue BP meds and statin  5.  Bicuspid AV -2D echo 08/2018 showed mild to moderate AR -repeat echo in 08/2019 to make sure AR has not progressed  6.  Atrial flutter -s/p DCCV -maintaining NSR but has had increased palpitations -continue Eliquis 5mg  BID for CHADS2VASC score 3 -continue Toprol  7.  PVCs -suspect this is the etiology of his palpitations.  -EKG today shows occsaional PVCs  -event monitor to assess for arrhythmias and assess PVC load  Medication Adjustments/Labs and Tests Ordered: Current medicines are reviewed at length with the patient today.  Concerns regarding medicines are outlined above.  Orders Placed This Encounter  Procedures  . Cardiac event monitor  . HEART 12-Lead   Meds ordered this encounter  Medications  . apixaban (ELIQUIS) 5 MG TABS tablet    Sig: Take 1 tablet (5 mg total) by mouth 2 (two) times daily.    Dispense:  180 tablet    Refill:  3    Signed, Drew Him, MD  06/23/2019 10:00 PM    [place]

## 2019-06-22 NOTE — Patient Instructions (Addendum)
Medication Instructions:  No changes today *If you need a refill on your cardiac medications before your next appointment, please call your pharmacy*  Lab Work: Today: lipids/alt If you have labs (blood work) drawn today and your tests are completely normal, you will receive your results only by: Marland Kitchen MyChart Message (if you have MyChart) OR . A paper copy in the mail If you have any lab test that is abnormal or we need to change your treatment, we will call you to review the results.  Testing/Procedures: Your physician has recommended that you wear an event monitor. Event monitors are medical devices that record the heart's electrical activity. Doctors most often [place] these monitors to diagnose arrhythmias. Arrhythmias are problems with the speed or rhythm of the heartbeat. The monitor is a small, portable device. You can wear one while you do your normal daily activities. This is usually used to diagnose what is causing palpitations/syncope (passing out).   Follow-Up: At [facility], you and your health needs are our priority.  As part of our continuing mission to provide you with exceptional heart care, we have created designated Provider Care Teams.  These Care Teams include your primary Cardiologist (physician) and Advanced Practice Providers (APPs -  Physician Assistants and Nurse Practitioners) who all work together to provide you with the care you need, when you need it.  Your next appointment:   2-3 week(s)  The format for your next appointment:   In person or virtual  Provider:   You may see one of the following Advanced Practice Providers on your designated Care Team:    Melina Copa, PA-C  Ermalinda Barrios, PA-C   Other Instructions

## 2019-06-22 NOTE — Telephone Encounter (Signed)
Patient was seen by Dr. Radford Pax today. After the visit he asked to see me to pass a message to Dr. Radford Pax.  He forgot to mention that he has been having periodontal pain (burning) for the past 3 months. He has concerns about his CPAP causing - Dr. Brett Fairy follows this and pt has f/u with them next week. He has googled that periodontal disease can cause heart problems.  He does not have an artificial heart valve and has an appointment with periodontist in one month and regular appointment today.  Adv him to discuss this at all of those follow up appointments.  Adv I would inform Dr. Radford Pax of his concerns and if she has further recommendations, we will call him.

## 2019-06-23 ENCOUNTER — Encounter: Payer: Self-pay | Admitting: Cardiology

## 2019-06-27 ENCOUNTER — Encounter (INDEPENDENT_AMBULATORY_CARE_PROVIDER_SITE_OTHER): Payer: Medicare Other

## 2019-06-27 DIAGNOSIS — I493 Ventricular premature depolarization: Secondary | ICD-10-CM

## 2019-06-27 DIAGNOSIS — I4892 Unspecified atrial flutter: Secondary | ICD-10-CM

## 2019-06-29 ENCOUNTER — Encounter: Payer: Self-pay | Admitting: Neurology

## 2019-07-01 ENCOUNTER — Encounter: Payer: Self-pay | Admitting: Adult Health

## 2019-07-01 ENCOUNTER — Other Ambulatory Visit: Payer: Self-pay

## 2019-07-01 ENCOUNTER — Ambulatory Visit (INDEPENDENT_AMBULATORY_CARE_PROVIDER_SITE_OTHER): Payer: Medicare Other | Admitting: Adult Health

## 2019-07-01 VITALS — BP 119/64 | HR 75 | Temp 97.6°F | Ht 66.0 in | Wt 151.2 lb

## 2019-07-01 DIAGNOSIS — G43109 Migraine with aura, not intractable, without status migrainosus: Secondary | ICD-10-CM

## 2019-07-01 DIAGNOSIS — I2510 Atherosclerotic heart disease of native coronary artery without angina pectoris: Secondary | ICD-10-CM | POA: Diagnosis not present

## 2019-07-01 DIAGNOSIS — Z9989 Dependence on other enabling machines and devices: Secondary | ICD-10-CM

## 2019-07-01 DIAGNOSIS — G4733 Obstructive sleep apnea (adult) (pediatric): Secondary | ICD-10-CM | POA: Diagnosis not present

## 2019-07-01 DIAGNOSIS — F5104 Psychophysiologic insomnia: Secondary | ICD-10-CM | POA: Diagnosis not present

## 2019-07-01 MED ORDER — BUTALBITAL-ASA-CAFF-CODEINE 50-325-40-30 MG PO CAPS: ORAL_CAPSULE | ORAL | 0 refills | Status: DC

## 2019-07-01 NOTE — Progress Notes (Signed)
PATIENT: [name] DOB: 1946/03/22  REASON FOR VISIT: follow up HISTORY FROM: patient  HISTORY OF PRESENT ILLNESS: Today 07/01/19: [name] is a 73 year old male with a history of obstructive sleep apnea on CPAP, migraine headaches and insomnia.  He returns today for follow-up.  His download indicates that he uses machine 30 out of 30 days for compliance of 100%.  He uses machine greater than 4 hours each night.  On average he uses his machine 7 hours and 55 minutes.  His residual AHI is 2.3 on 10 cm of water with EPR 3.  His leak in the 95th percentile is 17.7 L/min.  He reports that the CPAP continues to work well for him.  He reports that sometimes he has trouble getting a good seal with the mask most likely due to his beard.  He reports that his headaches are under good control with Zonegran and Fioricet.  He reports that he typically can take 2 tablets of Fioricet and his headache resolves fairly quickly.  He continues to use Xanax extended release at bedtime to help with insomnia.  Reports that this continues to work well.  He returns today for an evaluation.   HISTORY [name] is a 73 year old male with a history of obstructive sleep apnea on CPAP, migraine headaches and insomnia.  He joins me today for a virtual visit.  His download indicates that he uses machine nightly for compliance of 100%.  He uses machine greater than 4 hours 29 out of 30 days for compliance of 97%.  On average he uses the machine 7 hours and 2 minutes.  His residual AHI is 2.4 on 10 cm of water with EPR of 3.  He has a leak in the 95th percentile at 24.2 L/min.  He states that the leak is from his beard.  Reports that due to COVID-19 he was unable to get his beard trimmed. he continues to take Xanax extended release at bedtime for insomnia.  He continues on Zonegran and Fioricet for his migraine.  Reports that he has approximately 1 headache a week.  His headaches tend to respond well to Fioricet.  He  states that he typically takes Excedrin Migraine first.  His headache will typically resolve in 45 minutes if it does not he will then take Fioricet and the headache will resolve in 20 to 25 minutes.  REVIEW OF SYSTEMS: Out of a complete 14 system review of symptoms, the patient complains only of the following symptoms, and all other reviewed systems are negative.  See HPI  ALLERGIES: Allergies  Allergen Reactions  . Mushroom Extract Complex Nausea Only and Other (See Comments)    Severe vertigo and headaches    HOME MEDICATIONS: Outpatient Medications Prior to Visit  Medication Sig Dispense Refill  . ALPRAZolam (ALPRAZOLAM XR) 1 MG 24 hr tablet Take 1 tablet (1 mg total) by mouth daily. 30 tablet 5  . amLODipine (NORVASC) 10 MG tablet TAKE ONE TABLET BY MOUTH DAILY 90 tablet 2  . apixaban (ELIQUIS) 5 MG TABS tablet Take 1 tablet (5 mg total) by mouth 2 (two) times daily. 180 tablet 3  . Ascorbic Acid (VITAMIN C) 1000 MG tablet Take 1,000 mg by mouth 2 (two) times daily.    Marland Kitchen aspirin-acetaminophen-caffeine (EXCEDRIN EXTRA STRENGTH) 250-250-65 MG tablet Take 2 tablets by mouth every 6 (six) hours as needed for headache.    . butalbital-aspirin-caffeine-codeine (FIORINAL WITH CODEINE) 50-325-40-30 MG capsule TAKE ONE CAPSULE BY MOUTH EVERY 4  HOURS AS NEEDED FOR PAIN 15 capsule 0  . diazepam (VALIUM) 5 MG tablet Take 2.5-5 mg by mouth daily as needed for anxiety.    Marland Kitchen ibuprofen (ADVIL,MOTRIN) 200 MG tablet Take 200-400 mg by mouth daily as needed (for arthritis in the feet).    Marland Kitchen losartan (COZAAR) 25 MG tablet TAKE ONE TABLET BY MOUTH DAILY 90 tablet 2  . metoprolol succinate (TOPROL-XL) 100 MG 24 hr tablet TAKE ONE TABLET BY MOUTH DAILY 90 tablet 3  . niacin 500 MG tablet Take 500 mg by mouth at bedtime.    . nitroGLYCERIN (NITROSTAT) 0.4 MG SL tablet Place 1 tablet (0.4 mg total) under the tongue every 5 (five) minutes x 3 doses as needed for chest pain. 25 tablet 12  . rosuvastatin  (CRESTOR) 20 MG tablet Take by mouth.    Marland Kitchen Specialty Vitamins Products (CENTRUM SPECIALIST ENERGY) TABS Take 1 tablet by mouth daily with breakfast.    . Testosterone (ANDROGEL) 40.5 MG/2.5GM (1.62%) GEL Place 1 application onto the skin See admin instructions. Apply 1 pump to each shoulder once a day    . TURMERIC PO Take 1 capsule by mouth daily with breakfast.     . vitamin E 400 UNIT capsule Take 400 Units by mouth at bedtime.     Marland Kitchen zonisamide (ZONEGRAN) 25 MG capsule TAKE TWO CAPSULES BY MOUTH DAILY 180 capsule 3  . amoxicillin (AMOXIL) 500 MG capsule Take 4 tablets, 2 grams, 1 hour before each of your three dental procedures. (Patient not taking: Reported on 07/01/2019) 12 capsule 0   Facility-Administered Medications Prior to Visit  Medication Dose Route Frequency Provider Last Rate Last Admin  . 0.9 %  sodium chloride infusion  500 mL Intravenous Once [place], MD      . 0.9 %  sodium chloride infusion  500 mL Intravenous Once Armbruster, Carlota Raspberry, MD        PAST MEDICAL HISTORY: Past Medical History:  Diagnosis Date  . Anal fissure   . Arthritis   . Bicuspid aortic valve    with mild to moderate AR by echo 10/2017  . BPH (benign prostatic hyperplasia)   . CAD (coronary artery disease)    s/p CABG [place]  . Cataract    bilateral-removed  . Depression   . Dilated aortic root (H[facility])    86mm aortic root and 49mm ascending aorta by echo 10/2017  . History of blood transfusion 1998   "due to cardiac bypass surgery"  . Hyperlipidemia   . Hypertension   . Hypogonadism in male   . Kidney stones   . Migraine   . Myocardial infarction ([place])   . Retinal hemorrhage 11/04/2014  . Sleep apnea with use of continuous positive airway pressure (CPAP)     PAST SURGICAL HISTORY: Past Surgical History:  Procedure Laterality Date  . cataracts    . COLONOSCOPY    . CORONARY ARTERY BYPASS GRAFT  1998  . LEFT HEART CATH AND CORONARY ANGIOGRAPHY N/A 08/24/2018    Procedure: LEFT HEART CATH AND CORONARY ANGIOGRAPHY;  Surgeon: Lorretta Harp, MD;  Location: [facility];  Service: Cardiovascular;  Laterality: N/A;  . TEE WITHOUT CARDIOVERSION N/A 01/19/2015   Procedure: TRANSESOPHAGEAL ECHOCARDIOGRAM (TEE);  Surgeon: Sueanne Margarita, MD;  Location: [facility];  Service: Cardiovascular;  Laterality: N/A;  . [place]   pt denies    FAMILY HISTORY: Family History  Problem Relation Age of Onset  .  Arthritis Mother        deceased  . Hyperlipidemia Mother   . Diabetes Mother   . Arthritis Father   . Hyperlipidemia Father   . Heart disease Father 72  . Stroke Father 56       deceased  . Hypertension Father   . Diabetes Father   . Kidney disease Paternal Grandfather   . Colon cancer Neg Hx   . Colon polyps Neg Hx   . Esophageal cancer Neg Hx   . Stomach cancer Neg Hx   . Rectal cancer Neg Hx     SOCIAL HISTORY: Social History   Socioeconomic History  . Marital status: Divorced    Spouse name: Not on file  . Number of children: 0  . Years of education: Not on file  . Highest education level: Not on file  Occupational History  . Occupation: retired  Tobacco Use  . Smoking status: Former Smoker    Years: 20.00    Quit date: 07/22/1986    Years since quitting: 32.9  . Smokeless tobacco: Never Used  Substance and Sexual Activity  . Alcohol use: Yes    Comment: 0-1 daily  . Drug use: No  . Sexual activity: Not on file  Other Topics Concern  . Not on file  Social History Narrative   Divorced   Secondary school teacher- retired   No children   Has a Neurosurgeon named [name]   Enjoys outdoor activities- hiking, swimming, Control and instrumentation engineer, baseball, writing, reading, cooking   Social Determinants of Radio broadcast assistant Strain:   . Difficulty of Paying Living Expenses: Not on file  Food Insecurity:   . Worried About Charity fundraiser in the Last Year: Not on file  . Ran Out of Food in the Last Year: Not on file   Transportation Needs:   . Lack of Transportation (Medical): Not on file  . Lack of Transportation (Non-Medical): Not on file  Physical Activity:   . Days of Exercise per Week: Not on file  . Minutes of Exercise per Session: Not on file  Stress:   . Feeling of Stress : Not on file  Social Connections:   . Frequency of Communication with Friends and Family: Not on file  . Frequency of Social Gatherings with Friends and Family: Not on file  . Attends Religious Services: Not on file  . Active Member of Clubs or Organizations: Not on file  . Attends Archivist Meetings: Not on file  . Marital Status: Not on file  Intimate Partner Violence:   . Fear of Current or Ex-Partner: Not on file  . Emotionally Abused: Not on file  . Physically Abused: Not on file  . Sexually Abused: Not on file      PHYSICAL EXAM  Vitals:   07/01/19 0800  BP: 119/64  Pulse: 75  Temp: 97.6 F (36.4 C)  Weight: 151 lb 3.2 oz (68.6 kg)  Height: 5\' 6"  (1.676 m)   Body mass index is 24.4 kg/m.  Generalized: Well developed, in no acute distress   Neurological examination  Mentation: Alert oriented to time, place, history taking. Follows all commands speech and language fluent Cranial nerve II-XII: Pupils were equal round reactive to light. Extraocular movements were full, visual field were full on confrontational test. Head turning and shoulder shrug  were normal and symmetric. Motor: The motor testing reveals 5 over 5 strength of all 4 extremities. Good symmetric motor tone is noted throughout.  Sensory:  Sensory testing is intact to soft touch on all 4 extremities. No evidence of extinction is noted.  Coordination: Cerebellar testing reveals good finger-nose-finger and heel-to-shin bilaterally.  Gait and station: Gait is normal.    DIAGNOSTIC DATA (LABS, IMAGING, TESTING) - I reviewed patient records, labs, notes, testing and imaging myself where available.  Lab Results  Component Value  Date   WBC 9.4 09/21/2018   HGB 13.6 09/21/2018   HCT 39.5 09/21/2018   MCV 100.3 (H) 09/21/2018   PLT 248.0 09/21/2018      Component Value Date/Time   NA 142 09/03/2018 1532   K 4.6 09/03/2018 1532   CL 102 09/03/2018 1532   CO2 23 09/03/2018 1532   GLUCOSE 111 (H) 09/03/2018 1532   GLUCOSE 115 (H) 08/25/2018 0354   BUN 22 09/03/2018 1532   CREATININE 1.38 (H) 09/03/2018 1532   CALCIUM 9.3 09/03/2018 1532   PROT 6.6 08/21/2018 1837   ALBUMIN 3.8 08/21/2018 1837   AST 59 (H) 08/21/2018 1837   ALT 27 06/22/2019 1014   ALKPHOS 102 08/21/2018 1837   BILITOT 0.6 08/21/2018 1837   GFRNONAA 51 (L) 09/03/2018 1532   GFRAA 59 (L) 09/03/2018 1532   Lab Results  Component Value Date   CHOL 76 (L) 06/22/2019   HDL 40 06/22/2019   LDLCALC 19 06/22/2019   TRIG 79 06/22/2019   CHOLHDL 1.9 06/22/2019   Lab Results  Component Value Date   HGBA1C 5.9 (H) 08/22/2018   Lab Results  Component Value Date   [id number] 09/24/2018   No results found for: TSH    ASSESSMENT AND PLAN 73 y.o. year old male  has a past medical history of Anal fissure, Arthritis, Bicuspid aortic valve, BPH (benign prostatic hyperplasia), CAD (coronary artery disease), Cataract, Depression, Dilated aortic root ([place]), History of blood transfusion (1998), Hyperlipidemia, Hypertension, Hypogonadism in male, Kidney stones, Migraine, Myocardial infarction [facility]), Retinal hemorrhage (11/04/2014), and Sleep apnea with use of continuous positive airway pressure (CPAP). here with:  1.  Obstructive sleep apnea on CPAP  -Download shows good compliance -Encouraged to continue using CPAP nightly and greater than 4 hours each night  2.  Migraine headaches  -Continue Zonegran 50 mg daily. -Continue Fioricet as an abortive therapy  3.  Insomnia  -Continue Xanax XR 1 mg at bedtime  Overall the patient is doing well.  He is advised that if her symptoms worsen or he develops new symptoms he should let us know.  He  will follow-up in 1 year or sooner if needed.      [name], MSN, NP-C 07/01/2019, 8:07 AM [facility] [address] [place], [place] [address] [phone]

## 2019-07-01 NOTE — Patient Instructions (Signed)
Your Plan:  Continue Zonegran and Fioricet Continue Xanax XR  Continue using CPAP nightly and greater than 4 hours each night If your symptoms worsen or you develop new symptoms please let us know.    Thank you for coming to see [place] at [facility]. I hope we have been able to provide you high quality care today.  You may receive a patient satisfaction survey over the next few weeks. We would appreciate your feedback and comments so that we may continue to improve ourselves and the health of our patients.

## 2019-07-04 ENCOUNTER — Other Ambulatory Visit: Payer: Self-pay | Admitting: Cardiology

## 2019-07-05 ENCOUNTER — Telehealth: Payer: Self-pay | Admitting: *Deleted

## 2019-07-05 ENCOUNTER — Telehealth: Payer: Self-pay | Admitting: Cardiology

## 2019-07-05 NOTE — Telephone Encounter (Signed)
No Covid s/s and no known exposure.  Pt aware to take medications and insurance cards to his appt.  Reviewed directions and gate code given.

## 2019-07-05 NOTE — Telephone Encounter (Signed)
Received monitor results on patient which demonstrated At Fib/flutter with HR 120 bpm.  Reviewed by Dr Radford Pax who gave orders for the aptient to be seen in the [facility].  Attempted to contact him to review this information and get him scheduled.  LM for him to c/b.  Parking garage code for At [facility] this month is 7009.

## 2019-07-05 NOTE — Telephone Encounter (Signed)
Patient called stating on his last visit with Dr. Radford Pax he was prescribed Eliquis and it was approved through the [place]. He just started taking the Eliquis today.  He states he was talking to [facility] earlier today and wanted to let her know this.

## 2019-07-06 ENCOUNTER — Other Ambulatory Visit: Payer: Self-pay

## 2019-07-06 ENCOUNTER — Ambulatory Visit (HOSPITAL_COMMUNITY)
Admission: RE | Admit: 2019-07-06 | Discharge: 2019-07-06 | Disposition: A | Discharge status: Home / Self Care | Payer: Medicare Other | Source: Ambulatory Visit | Attending: Physician Assistant | Admitting: Physician Assistant

## 2019-07-06 VITALS — BP 144/86 | HR 57 | Ht 66.0 in | Wt 150.8 lb

## 2019-07-06 DIAGNOSIS — E785 Hyperlipidemia, unspecified: Secondary | ICD-10-CM | POA: Diagnosis not present

## 2019-07-06 DIAGNOSIS — I10 Essential (primary) hypertension: Secondary | ICD-10-CM | POA: Insufficient documentation

## 2019-07-06 DIAGNOSIS — Z823 Family history of stroke: Secondary | ICD-10-CM | POA: Insufficient documentation

## 2019-07-06 DIAGNOSIS — I4892 Unspecified atrial flutter: Secondary | ICD-10-CM | POA: Insufficient documentation

## 2019-07-06 DIAGNOSIS — F419 Anxiety disorder, unspecified: Secondary | ICD-10-CM | POA: Diagnosis not present

## 2019-07-06 DIAGNOSIS — I25810 Atherosclerosis of coronary artery bypass graft(s) without angina pectoris: Secondary | ICD-10-CM | POA: Diagnosis not present

## 2019-07-06 DIAGNOSIS — Z8261 Family history of arthritis: Secondary | ICD-10-CM | POA: Insufficient documentation

## 2019-07-06 DIAGNOSIS — F329 Major depressive disorder, single episode, unspecified: Secondary | ICD-10-CM | POA: Diagnosis not present

## 2019-07-06 DIAGNOSIS — I252 Old myocardial infarction: Secondary | ICD-10-CM | POA: Diagnosis not present

## 2019-07-06 DIAGNOSIS — Z951 Presence of aortocoronary bypass graft: Secondary | ICD-10-CM | POA: Diagnosis not present

## 2019-07-06 DIAGNOSIS — I4819 Other persistent atrial fibrillation: Secondary | ICD-10-CM | POA: Insufficient documentation

## 2019-07-06 DIAGNOSIS — Z87891 Personal history of nicotine dependence: Secondary | ICD-10-CM | POA: Insufficient documentation

## 2019-07-06 DIAGNOSIS — Z91018 Allergy to other foods: Secondary | ICD-10-CM | POA: Diagnosis not present

## 2019-07-06 DIAGNOSIS — Z79899 Other long term (current) drug therapy: Secondary | ICD-10-CM | POA: Insufficient documentation

## 2019-07-06 DIAGNOSIS — G4733 Obstructive sleep apnea (adult) (pediatric): Secondary | ICD-10-CM | POA: Insufficient documentation

## 2019-07-06 DIAGNOSIS — I480 Paroxysmal atrial fibrillation: Secondary | ICD-10-CM | POA: Insufficient documentation

## 2019-07-06 DIAGNOSIS — Z8349 Family history of other endocrine, nutritional and metabolic diseases: Secondary | ICD-10-CM | POA: Diagnosis not present

## 2019-07-06 DIAGNOSIS — Z8249 Family history of ischemic heart disease and other diseases of the circulatory system: Secondary | ICD-10-CM | POA: Diagnosis not present

## 2019-07-06 DIAGNOSIS — Z833 Family history of diabetes mellitus: Secondary | ICD-10-CM | POA: Insufficient documentation

## 2019-07-06 DIAGNOSIS — I080 Rheumatic disorders of both mitral and aortic valves: Secondary | ICD-10-CM | POA: Insufficient documentation

## 2019-07-06 DIAGNOSIS — M1710 Unilateral primary osteoarthritis, unspecified knee: Secondary | ICD-10-CM | POA: Insufficient documentation

## 2019-07-06 DIAGNOSIS — R001 Bradycardia, unspecified: Secondary | ICD-10-CM | POA: Insufficient documentation

## 2019-07-06 DIAGNOSIS — D6869 Other thrombophilia: Secondary | ICD-10-CM | POA: Insufficient documentation

## 2019-07-06 DIAGNOSIS — Z7901 Long term (current) use of anticoagulants: Secondary | ICD-10-CM | POA: Diagnosis not present

## 2019-07-06 MED ORDER — METOPROLOL TARTRATE 25 MG PO TABS: 25.0000 mg | ORAL_TABLET | Freq: Four times a day (QID) | ORAL | 2 refills | Status: DC | PRN

## 2019-07-06 NOTE — Progress Notes (Signed)
Primary Care Physician: Burman Freestone, MD Primary Cardiologist: Dr Radford Pax Primary Electrophysiologist: none Referring Physician: Dr Jules Husbands is a 73 y.o. male with a history of CAD with CABG in 1998,bicuspid AV with moderate to severe AI and dilated aortic rootfollowed by Dr. [facility], dyslipidemia, HTN, OSA, atrial flutter and paroxysmal atrial fibrillation who presents for consultation in the [facility].  The patient was initially diagnosed with atrial fibrillation and atrial flutter after presented to [facility] ER with symptoms of palpitaitons and found to be in rapid atrial flutter. He underwent successful DCCV. He is on Eliquis for a CHADS2VASC score of 3. Patient saw Dr Radford Pax in follow up on 06/22/19 and reported that he was having increased palpitations. A 30 day event monitor was placed. Which did show an episode of afib HR 120. Patient reports that he did have symptoms of palpitations for about 1 hour which correspond to the event. He denies any significant alcohol use. There were no specific triggers that he could identify.   Today, he denies symptoms of chest pain, shortness of breath, orthopnea, PND, lower extremity edema, dizziness, presyncope, syncope, snoring, daytime somnolence, bleeding, or neurologic sequela. The patient is tolerating medications without difficulties and is otherwise without complaint today.    Atrial Fibrillation Risk Factors:  he does have symptoms or diagnosis of sleep apnea. he is compliant with CPAP therapy. he does not have a history of rheumatic fever. he does not have a history of alcohol use. The patient does not have a history of early familial atrial fibrillation or other arrhythmias.  he has a BMI of There is no height or weight on file to calculate BMI.. There were no vitals filed for this visit.  Family History  Problem Relation Age of Onset  . Arthritis Mother    deceased  . Hyperlipidemia Mother   . Diabetes Mother   . Arthritis Father   . Hyperlipidemia Father   . Heart disease Father 62  . Stroke Father 79       deceased  . Hypertension Father   . Diabetes Father   . Kidney disease Paternal Grandfather   . Colon cancer Neg Hx   . Colon polyps Neg Hx   . Esophageal cancer Neg Hx   . Stomach cancer Neg Hx   . Rectal cancer Neg Hx      Atrial Fibrillation Management history:  Previous antiarrhythmic drugs: none Previous cardioversions: 04/2019 [facility]) Previous ablations: none CHADS2VASC score: 3 Anticoagulation history: Eliquis   Past Medical History:  Diagnosis Date  . Anal fissure   . Arthritis   . Bicuspid aortic valve    with mild to moderate AR by echo 10/2017  . BPH (benign prostatic hyperplasia)   . CAD (coronary artery disease)    s/p CABG [facility] [place]  . Cataract    bilateral-removed  . Depression   . Dilated aortic root (HCC)    66mm aortic root and 70mm ascending aorta by echo 10/2017  . History of blood transfusion 1998   "due to cardiac bypass surgery"  . Hyperlipidemia   . Hypertension   . Hypogonadism in male   . Kidney stones   . Migraine   . Myocardial infarction ([place])   . Retinal hemorrhage 11/04/2014  . Sleep apnea with use of continuous positive airway pressure (CPAP)    Past Surgical History:  Procedure Laterality Date  . cataracts    .  COLONOSCOPY    . CORONARY ARTERY BYPASS GRAFT  1998  . LEFT HEART CATH AND CORONARY ANGIOGRAPHY N/A 08/24/2018   Procedure: LEFT HEART CATH AND CORONARY ANGIOGRAPHY;  Surgeon: Lorretta Harp, MD;  Location: [facility];  Service: Cardiovascular;  Laterality: N/A;  . TEE WITHOUT CARDIOVERSION N/A 01/19/2015   Procedure: TRANSESOPHAGEAL ECHOCARDIOGRAM (TEE);  Surgeon: Sueanne Margarita, MD;  Location: [facility] ENDOSCOPY;  Service: Cardiovascular;  Laterality: N/A;  . [place]   pt denies    Current Outpatient Medications  Medication  Sig Dispense Refill  . ALPRAZolam (ALPRAZOLAM XR) 1 MG 24 hr tablet Take 1 tablet (1 mg total) by mouth daily. 30 tablet 5  . amLODipine (NORVASC) 10 MG tablet TAKE ONE TABLET BY MOUTH DAILY 90 tablet 3  . amoxicillin (AMOXIL) 500 MG capsule Take 4 tablets, 2 grams, 1 hour before each of your three dental procedures. (Patient not taking: Reported on 07/01/2019) 12 capsule 0  . apixaban (ELIQUIS) 5 MG TABS tablet Take 1 tablet (5 mg total) by mouth 2 (two) times daily. 180 tablet 3  . Ascorbic Acid (VITAMIN C) 1000 MG tablet Take 1,000 mg by mouth 2 (two) times daily.    Marland Kitchen aspirin-acetaminophen-caffeine (EXCEDRIN EXTRA STRENGTH) 250-250-65 MG tablet Take 2 tablets by mouth every 6 (six) hours as needed for headache.    . butalbital-aspirin-caffeine-codeine (FIORINAL WITH CODEINE) 50-325-40-30 MG capsule TAKE ONE CAPSULE BY MOUTH EVERY 4 HOURS AS NEEDED FOR Migraine 30 capsule 0  . diazepam (VALIUM) 5 MG tablet Take 2.5-5 mg by mouth daily as needed for anxiety.    Marland Kitchen ibuprofen (ADVIL,MOTRIN) 200 MG tablet Take 200-400 mg by mouth daily as needed (for arthritis in the feet).    Marland Kitchen losartan (COZAAR) 25 MG tablet TAKE ONE TABLET BY MOUTH DAILY 90 tablet 2  . metoprolol succinate (TOPROL-XL) 100 MG 24 hr tablet TAKE ONE TABLET BY MOUTH DAILY 90 tablet 3  . niacin 500 MG tablet Take 500 mg by mouth at bedtime.    . nitroGLYCERIN (NITROSTAT) 0.4 MG SL tablet Place 1 tablet (0.4 mg total) under the tongue every 5 (five) minutes x 3 doses as needed for chest pain. 25 tablet 12  . rosuvastatin (CRESTOR) 20 MG tablet Take by mouth.    Marland Kitchen Specialty Vitamins Products (CENTRUM SPECIALIST ENERGY) TABS Take 1 tablet by mouth daily with breakfast.    . Testosterone (ANDROGEL) 40.5 MG/2.5GM (1.62%) GEL Place 1 application onto the skin See admin instructions. Apply 1 pump to each shoulder once a day    . TURMERIC PO Take 1 capsule by mouth daily with breakfast.     . vitamin E 400 UNIT capsule Take 400 Units by mouth  at bedtime.     Marland Kitchen zonisamide (ZONEGRAN) 25 MG capsule TAKE TWO CAPSULES BY MOUTH DAILY 180 capsule 3   Current Facility-Administered Medications  Medication Dose Route Frequency Provider Last Rate Last Admin  . 0.9 %  sodium chloride infusion  500 mL Intravenous Once [place], MD      . 0.9 %  sodium chloride infusion  500 mL Intravenous Once Armbruster, Carlota Raspberry, MD        Allergies  Allergen Reactions  . Mushroom Extract Complex Nausea Only and Other (See Comments)    Severe vertigo and headaches    Social History   Socioeconomic History  . Marital status: Divorced    Spouse name: Not on file  . Number of children: 0  .  Years of education: Not on file  . Highest education level: Not on file  Occupational History  . Occupation: retired  Tobacco Use  . Smoking status: Former Smoker    Years: 20.00    Quit date: 07/22/1986    Years since quitting: 32.9  . Smokeless tobacco: Never Used  Substance and Sexual Activity  . Alcohol use: Yes    Comment: 0-1 daily  . Drug use: No  . Sexual activity: Not on file  Other Topics Concern  . Not on file  Social History Narrative   Divorced   Secondary school teacher- retired   No children   Has a Neurosurgeon named [name]   Enjoys outdoor activities- hiking, swimming, Control and instrumentation engineer, baseball, writing, reading, cooking   Social Determinants of Radio broadcast assistant Strain:   . Difficulty of Paying Living Expenses: Not on file  Food Insecurity:   . Worried About Charity fundraiser in the Last Year: Not on file  . Ran Out of Food in the Last Year: Not on file  Transportation Needs:   . Lack of Transportation (Medical): Not on file  . Lack of Transportation (Non-Medical): Not on file  Physical Activity:   . Days of Exercise per Week: Not on file  . Minutes of Exercise per Session: Not on file  Stress:   . Feeling of Stress : Not on file  Social Connections:   . Frequency of Communication with Friends and Family: Not on file  .  Frequency of Social Gatherings with Friends and Family: Not on file  . Attends Religious Services: Not on file  . Active Member of Clubs or Organizations: Not on file  . Attends Archivist Meetings: Not on file  . Marital Status: Not on file  Intimate Partner Violence:   . Fear of Current or Ex-Partner: Not on file  . Emotionally Abused: Not on file  . Physically Abused: Not on file  . Sexually Abused: Not on file     ROS- All systems are reviewed and negative except as per the HPI above.  Physical Exam: There were no vitals filed for this visit.  GEN- The patient is well appearing, alert and oriented x 3 today.   Head- normocephalic, atraumatic Eyes-  Sclera clear, conjunctiva pink Ears- hearing intact Oropharynx- clear Neck- supple  Lungs- Clear to ausculation bilaterally, normal work of breathing Heart- Regular rate and rhythm, no murmurs, rubs or gallops  GI- soft, NT, ND, + BS Extremities- no clubbing, cyanosis, or edema MS- no significant deformity or atrophy Skin- no rash or lesion Psych- euthymic mood, full affect Neuro- strength and sensation are intact  Wt Readings from Last 3 Encounters:  07/01/19 68.6 kg  06/22/19 64.4 kg  06/09/19 65.8 kg    EKG today demonstrates SB HR 57, PR 196, QRS 80, QTc 420  Echo 08/22/18 demonstrated   1. The left ventricle has normal systolic function of 123456. The cavity size is normal. There is mild concentric left ventricular hypertrophy. Echo evidence of normal diastolic filling patterns. Normal left ventricular filling pressures.  2. Normal left atrial size.  3. Normal right atrial size.  4. The mitral valve normal in structure. There is mild mitral annular calcification present. Regurgitation is trivial by color flow Doppler . No evidence of mitral valve stenosis.  5. Normal tricuspid valve.  6. The aortic valve possibly bicuspid. Aortic valve regurgitation is mild to moderate by color flow Doppler. The jet is  eccentric posteriorly directed.  7. Moderate dilatation of the aortic root.  8. No atrial level shunt detected by color flow Doppler.  9. Consider further evaluation of the ascending aorta with CT angiography.  Epic records are reviewed at length today  Assessment and Plan:  1. Paroxysmal atrial fibrillation/atrial flutter General education about afib provided and questions answered. We also discussed his stroke risk and the risks and benefits of anticoagulation.  Continue Eliquis 5 mg BID (started 12/14) Continue Toprol 100 mg daily. Will start Lopressor 25 mg PRN q 6 hrs for heart racing.  Will hold on AAD therapy until monitor results to assess AF burden.   This patients CHA2DS2-VASc Score and unadjusted Ischemic Stroke Rate (% per year) is equal to 3.2 % stroke rate/year from a score of 3  Above score calculated as 1 point each if present [CHF, HTN, DM, Vascular=MI/PAD/Aortic Plaque, Age if 65-74, or Male] Above score calculated as 2 points each if present [Age > 75, or Stroke/TIA/TE]   2. Obstructive sleep apnea The importance of adequate treatment of sleep apnea was discussed today in order to improve our ability to maintain sinus rhythm long term. Patient compliant with CPAP therapy.  3. HTN Stable, no changes today.  4. CAD No anginal symptoms today. Followed by Dr Radford Pax.   Follow up in the AF clinic in one month.   [facility] [address] [place], [place] [address] [phone] 07/06/2019 8:27 AM

## 2019-07-06 NOTE — Patient Instructions (Signed)
Metoprolol 25mg  every 6 hours as needed

## 2019-07-12 NOTE — Progress Notes (Addendum)
Cardiology virtual phone note. This visit type was conducted due to national recommendations for restrictions regarding the COVID-19 Pandemic (e.g. social distancing) in an effort to limit this patient's exposure and mitigate transmission in our community.  Due to his co-morbid illnesses, this patient is at least at moderate risk for complications without adequate follow up.  This format is felt to be most appropriate for this patient at this time.  The patient did not have access to video technology/had technical difficulties with video requiring transitioning to audio format only (telephone).  All issues noted in this document were discussed and addressed.  No physical exam could be performed with this format.  Please refer to the patient's chart for his  consent to telehealth for [facility].   Pt location: Home Provider location:  office  Date:  07/13/2019   ID:  [name], DOB 07/11/1946, MRN [id number]  PCP:  Burman Freestone, MD  Cardiologist:  Dr. Radford Pax    Chief Complaint  Patient presents with  . Chest Pain      History of Present Illness: [name] is a 73 y.o. male who presents for pain follow up.   He has a hx of CAD with CABG in 1998,bicuspid AV with moderate to severe AI and dilated aortic root measuring 4.8cm in diameter by TEE 12/2014 followed by Dr. Clementeen Graham at [facility]. He has mild carotid artery stenosis on dopplers. He has a history of dyslipidemia. He has a history of mild renal insufficiency felt secondary to hypertensive nephrosclerosis.  He was admitted to [facility] late January with CP and ruled in for NSTEMI with trop 3.31. Cath showed occluded left radial graft to the RCA. He hada patent LIMA to the LAD with left to right collaterals and moderate nondominant circumflex disease. His LVEDPwasnormal.Medical therapywas recommended.  He was seen in ER at [facility] ER complaining of palpitations. He was found to be in aflutter with RVR  and was cardioverted to NSR. He did not want pursue anticoagulation until he talked with me.  He was started on Eliquis and referred to afib clinic.  He made an appt today for evaluation of chest pain. He tells me that he lost a very close friend to [place] recently.  He became very tearful talking about it.  Since then he has been under a great deal of emotional stress and has been experiencing CP.  The pain is somewhat atypical and is on the right side of his chest.  It is sharp and not like the pain he had with his MI.  He thinks it is stress related.  He also has been having skipping of his heart beat.  He denies any DOE, PND, orthopnea, LE edema, dizziness or syncope.   Imdur 15 mg daily started.  Here for follow up for pain.  He is on Eliquis for a CHADS2VASC score of 3  In A fib clinic prn lopressor was given EKG then SB at 57   He wore a montior and had a fib HR 120 was sent to a fib clinic on 07/06/19.  [facility].  He may be having Eliquis headaches and he has hx of migraine headaches.  No bleeding on eliquis.  No chest pain since last visit with dr. Radford Pax. Answered questions concerning eliquis and a fib.  When to take prn lopressor.  Exercise and discussion on ibuprofen and Excedrin he takes 4-6 per week.  He will notifiy neurology about addition  of eliquis.  Discussed increased risk of GI bleed -- his fiorinal with codeine he does believe is tylenol not ASA (fioricet) which if not should be changed.  He is to follow with a fib clinic in Jan.    The patient does not have symptoms concerning for COVID-19 infection (fever, chills, cough, or new shortness of breath).     Past Medical History:  Diagnosis Date  . Anal fissure   . Arthritis   . Bicuspid aortic valve    with mild to moderate AR by echo 10/2017  . BPH (benign prostatic hyperplasia)   . CAD (coronary artery disease)    s/p CABG [facility] [place]  . Cataract    bilateral-removed  . Depression   . Dilated  aortic root (HCC)    55mm aortic root and 93mm ascending aorta by echo 10/2017  . History of blood transfusion 1998   "due to cardiac bypass surgery"  . Hyperlipidemia   . Hypertension   . Hypogonadism in male   . Kidney stones   . Migraine   . Myocardial infarction ([place])   . Retinal hemorrhage 11/04/2014  . Sleep apnea with use of continuous positive airway pressure (CPAP)     Past Surgical History:  Procedure Laterality Date  . cataracts    . COLONOSCOPY    . CORONARY ARTERY BYPASS GRAFT  1998  . LEFT HEART CATH AND CORONARY ANGIOGRAPHY N/A 08/24/2018   Procedure: LEFT HEART CATH AND CORONARY ANGIOGRAPHY;  Surgeon: Lorretta Harp, MD;  Location: [facility];  Service: Cardiovascular;  Laterality: N/A;  . TEE WITHOUT CARDIOVERSION N/A 01/19/2015   Procedure: TRANSESOPHAGEAL ECHOCARDIOGRAM (TEE);  Surgeon: Sueanne Margarita, MD;  Location: [facility] ENDOSCOPY;  Service: Cardiovascular;  Laterality: N/A;  . [place]   pt denies     Current Outpatient Medications  Medication Sig Dispense Refill  . ALPRAZolam (ALPRAZOLAM XR) 1 MG 24 hr tablet Take 1 tablet (1 mg total) by mouth daily. 30 tablet 5  . amLODipine (NORVASC) 10 MG tablet TAKE ONE TABLET BY MOUTH DAILY 90 tablet 3  . amoxicillin (AMOXIL) 500 MG capsule Take 4 tablets, 2 grams, 1 hour before each of your three dental procedures. 12 capsule 0  . apixaban (ELIQUIS) 5 MG TABS tablet Take 1 tablet (5 mg total) by mouth 2 (two) times daily. 180 tablet 3  . Ascorbic Acid (VITAMIN C) 1000 MG tablet Take 1,000 mg by mouth 2 (two) times daily.    Marland Kitchen aspirin-acetaminophen-caffeine (EXCEDRIN EXTRA STRENGTH) 250-250-65 MG tablet Take 2 tablets by mouth as needed for headache.     . butalbital-aspirin-caffeine-codeine (FIORINAL WITH CODEINE) 50-325-40-30 MG capsule TAKE ONE CAPSULE BY MOUTH EVERY 4 HOURS AS NEEDED FOR Migraine (Patient taking differently: as needed. TAKE ONE CAPSULE BY MOUTH EVERY 4 HOURS AS NEEDED FOR  Migraine) 30 capsule 0  . diazepam (VALIUM) 5 MG tablet Take 2.5-5 mg by mouth as needed for anxiety.     Marland Kitchen ibuprofen (ADVIL,MOTRIN) 200 MG tablet Take 200-400 mg by mouth as needed (for arthritis in the feet).     Marland Kitchen losartan (COZAAR) 25 MG tablet TAKE ONE TABLET BY MOUTH DAILY 90 tablet 2  . metoprolol succinate (TOPROL-XL) 100 MG 24 hr tablet TAKE ONE TABLET BY MOUTH DAILY 90 tablet 3  . metoprolol tartrate (LOPRESSOR) 25 MG tablet Take 1 tablet (25 mg total) by mouth every 6 (six) hours as needed (Heart Rate >100). 60 tablet 2  . niacin  500 MG tablet Take 500 mg by mouth at bedtime.    . nitroGLYCERIN (NITROSTAT) 0.4 MG SL tablet Place 1 tablet (0.4 mg total) under the tongue every 5 (five) minutes x 3 doses as needed for chest pain. 25 tablet 12  . rosuvastatin (CRESTOR) 20 MG tablet Take by mouth.    Marland Kitchen Specialty Vitamins Products (CENTRUM SPECIALIST ENERGY) TABS Take 1 tablet by mouth daily with breakfast.    . Testosterone (ANDROGEL) 40.5 MG/2.5GM (1.62%) GEL Place 1 application onto the skin See admin instructions. Apply 1 pump to each shoulder once a day    . TURMERIC PO Take 1 capsule by mouth daily with breakfast.     . vitamin E 400 UNIT capsule Take 400 Units by mouth at bedtime.     Marland Kitchen zonisamide (ZONEGRAN) 25 MG capsule TAKE TWO CAPSULES BY MOUTH DAILY 180 capsule 3   Current Facility-Administered Medications  Medication Dose Route Frequency Provider Last Rate Last Admin  . 0.9 %  sodium chloride infusion  500 mL Intravenous Once Thornton Park, MD      . 0.9 %  sodium chloride infusion  500 mL Intravenous Once Armbruster, Carlota Raspberry, MD        Allergies:   Mushroom extract complex    Social History:  The patient  reports that he quit smoking about 32 years ago. He quit after 20.00 years of use. He has never used smokeless tobacco. He reports current alcohol use. He reports that he does not use drugs.   Family History:  The patient's family history includes Arthritis in his  father and mother; Diabetes in his father and mother; Heart disease (age of onset: 73) in his father; Hyperlipidemia in his father and mother; Hypertension in his father; Kidney disease in his paternal grandfather; Stroke (age of onset: 71) in his father.    ROS:  General:no colds or fevers, no weight changes Skin:no rashes or ulcers HEENT:no blurred vision, no congestion CV:see HPI PUL:see HPI GI:no diarrhea constipation or melena, no indigestion GU:no hematuria, no dysuria MS:no joint pain, no claudication Neuro:no syncope, no lightheadedness Endo:no diabetes, no thyroid disease Wt Readings from Last 3 Encounters:  07/13/19 149 lb (67.6 kg)  07/06/19 150 lb 12.8 oz (68.4 kg)  07/01/19 151 lb 3.2 oz (68.6 kg)     PHYSICAL EXAM: VS:  BP 112/74   Pulse 62   Ht 5\' 6"  (1.676 m)   Wt 149 lb (67.6 kg)   BMI 24.05 kg/m  , BMI Body mass index is 24.05 kg/m. General:Pleasant affect, NAD Skin:Warm and dry, brisk capillary refill HEENT:normocephalic, sclera clear, mucus membranes moist Neck:supple, no JVD, no bruits  Heart:S1S2 RRR without murmur, gallup, rub or click Lungs:clear without rales, rhonchi, or wheezes [id number], non tender, + BS, do not palpate liver spleen or masses Ext:no lower ext edema, 2+ pedal pulses, 2+ radial pulses Neuro:alert and oriented, MAE, follows commands, + facial symmetry    EKG:  EKG is NOT ordered today.    Recent Labs: 08/21/2018: Magnesium 2.0 09/03/2018: BUN 22; Creatinine, Ser 1.38; Potassium 4.6; Sodium 142 09/21/2018: Hemoglobin 13.6; Platelets 248.0 06/22/2019: ALT 27    Lipid Panel    Component Value Date/Time   CHOL 76 (L) 06/22/2019 1014   TRIG 79 06/22/2019 1014   HDL 40 06/22/2019 1014   CHOLHDL 1.9 06/22/2019 1014   CHOLHDL 2.8 08/22/2018 0645   VLDL 12 08/22/2018 0645   LDLCALC 19 06/22/2019 1014       Other studies  Reviewed: Additional studies/ records that were reviewed today include:  Cardiac cath 08/24/18  Ost LAD  lesion is 100% stenosed.  Ost RCA to Prox RCA lesion is 100% stenosed.  Origin lesion is 100% stenosed.  Prox Cx lesion is 50% stenosed.   He has an occluded left radial graft to the RCA.  He has a patent LIMA to the LAD with left to right collaterals and moderate nondominant circumflex disease.  I did do a supravalvular aortogram and demonstrated no patent grafts.  His LVEDP is normal.  Medical therapy will be recommended.   Echo 08/22/18 IMPRESSIONS    1. The left ventricle has normal systolic function of 123456. The cavity size is normal. There is mild concentric left ventricular hypertrophy. Echo evidence of normal diastolic filling patterns. Normal left ventricular filling pressures.  2. Normal left atrial size.  3. Normal right atrial size.  4. The mitral valve normal in structure. There is mild mitral annular calcification present. Regurgitation is trivial by color flow Doppler . No evidence of mitral valve stenosis.  5. Normal tricuspid valve.  6. The aortic valve possibly bicuspid. Aortic valve regurgitation is mild to moderate by color flow Doppler. The jet is eccentric posteriorly directed.  7. Moderate dilatation of the aortic root.  8. No atrial level shunt detected by color flow Doppler.  9. Consider further evaluation of the ascending aorta with CT angiography.  FINDINGS  Left Ventricle: No evidence of left ventricular regional wall motion abnormalities. The left ventricle has normal systolic function of 123456. The cavity size is normal. There is mild concentric left ventricular hypertrophy. Echo evidence of normal  diastolic filling patterns. Normal left ventricular filling pressures. Right Ventricle: The right ventricle is normal in size. There is normal wall thickness. There is normal systolic function. Right ventricular systolic pressure is mildly elevated with an estimated pressure of 33.5 mmHg. Left Atrium: The left atrium is normal in size. Right Atrium: The right  atrial size is normal in size. Interatrial Septum: No atrial level shunt detected by color flow Doppler.  Pericardium: There is no evidence of pericardial effusion. Mitral Valve: The mitral valve normal in structure. There is mild mitral annular calcification present. Regurgitation is trivial by color flow Doppler . No evidence of mitral valve stenosis. Tricuspid Valve: The tricuspid valve is normal in structure. Tricuspid regurgitation is trivial by color flow Doppler. Aortic Valve: The aortic valve possibly bicuspid. Pulmonic Valve: The pulmonic valve is grossly normal. Pulmonic valve regurgitation is trivial by color flow Doppler. No evidence of pulmonic stenosis. Aorta: There is moderate dilatation of the aortic root. In comparison to the previous echocardiogram(s): Consider further evaluation of the ascending aorta with CT angiography.   LEFT VENTRICLE PLAX 2D (Teich) LV EF:          57.7 %   Diastology LVIDd:          4.38 cm  LV e' lateral:   11.10 cm/s LVIDs:          3.06 cm  LV E/e' lateral: 9.0 LV PW:          1.43 cm  LV e' medial:    7.72 cm/s LV IVS:         1.63 cm  LV E/e' medial:  13.0 LVOT diam:      2.20 cm LV SV:          50 ml LVOT Area:      3.80 cm  RIGHT VENTRICLE RV Basal diam:  4.34 cm RV S prime:     11.90 cm/s TAPSE (M-mode): 1.8 cm RVSP:           33.5 mmHg  LEFT ATRIUM             Index       RIGHT ATRIUM           Index LA diam:        4.20 cm 2.30 cm/m  RA Pressure: 3 mmHg LA Vol (A2C):   46.8 ml 25.62 ml/m RA Area:     16.50 cm LA Vol (A4C):   30.5 ml 16.70 ml/m RA Volume:   36.80 ml  20.15 ml/m LA Biplane Vol: 37.8 ml 20.70 ml/m  AORTIC VALVE LVOT Vmax:   130.00 cm/s LVOT Vmean:  82.100 cm/s LVOT VTI:    0.278 m   AORTA Ao Root diam: 4.60 cm  MITRAL VALVE               TR Peak grad: 30.5 mmHg MV Area (PHT): 3.42 cm    TR Vmax:      276.00 cm/s MV PHT:        64.38 msec  RVSP:         33.5 mmHg MV Decel Time: 222 msec MV E  velocity: 100.00 cm/s      ASSESSMENT AND PLAN:  1.  Chest pain, sharp and no further episodes. 2.  PAF on eliquis and BB, no bleeding - may have eliquis headache will monitor for now.  He will begin exercising. First on treadmill.    Maintaining SR.  3.  Anticoagulation on eliquis, will check with neuro concerning meds for migraine with anticoagulation.   4.  HTN controlled 5.  Ascending aortic aneurysm, followed by Dr. Clementeen Graham- stable 6. Bicuspid aortic valve 7.  CAD without angina.   Follow up with Dr. Radford Pax in 6 months.  Visit 8 minutes   COVID-19 Education: The signs and symptoms of COVID-19 were discussed with the patient and how to seek care for testing (follow up with PCP or arrange E-visit).  The importance of social distancing was discussed today. Pt will take vaccine through the [place]  Current medicines are reviewed with the patient today.  The patient Has no concerns regarding medicines.  The following changes have been made:  See above Labs/ tests ordered today include:see above  Disposition:   FU:  see above  Signed, Cecilie Kicks, NP  07/13/2019 11:38 AM    [address], [place], [place], [place] Phone: [phone]; Fax: [phone]

## 2019-07-13 ENCOUNTER — Encounter: Payer: Self-pay | Admitting: Cardiology

## 2019-07-13 ENCOUNTER — Other Ambulatory Visit: Payer: Self-pay

## 2019-07-13 ENCOUNTER — Telehealth (INDEPENDENT_AMBULATORY_CARE_PROVIDER_SITE_OTHER): Payer: Medicare Other | Admitting: Cardiology

## 2019-07-13 VITALS — BP 112/74 | HR 62 | Ht 66.0 in | Wt 149.0 lb

## 2019-07-13 DIAGNOSIS — R079 Chest pain, unspecified: Secondary | ICD-10-CM

## 2019-07-13 DIAGNOSIS — I351 Nonrheumatic aortic (valve) insufficiency: Secondary | ICD-10-CM

## 2019-07-13 DIAGNOSIS — I10 Essential (primary) hypertension: Secondary | ICD-10-CM

## 2019-07-13 DIAGNOSIS — I480 Paroxysmal atrial fibrillation: Secondary | ICD-10-CM

## 2019-07-13 DIAGNOSIS — I2510 Atherosclerotic heart disease of native coronary artery without angina pectoris: Secondary | ICD-10-CM

## 2019-07-13 DIAGNOSIS — Z7901 Long term (current) use of anticoagulants: Secondary | ICD-10-CM

## 2019-07-13 DIAGNOSIS — I712 Thoracic aortic aneurysm, without rupture: Secondary | ICD-10-CM

## 2019-07-13 DIAGNOSIS — I7121 Aneurysm of the ascending aorta, without rupture: Secondary | ICD-10-CM

## 2019-07-13 DIAGNOSIS — Q231 Congenital insufficiency of aortic valve: Secondary | ICD-10-CM

## 2019-07-13 NOTE — Patient Instructions (Signed)
Medication Instructions:  Your physician recommends that you continue on your current medications as directed. Please refer to the Current Medication list given to you today.  *If you need a refill on your cardiac medications before your next appointment, please call your pharmacy*  Lab Work: None ordered  If you have labs (blood work) drawn today and your tests are completely normal, you will receive your results only by: Marland Kitchen MyChart Message (if you have MyChart) OR . A paper copy in the mail If you have any lab test that is abnormal or we need to change your treatment, we will call you to review the results.  Testing/Procedures: None ordered  Follow-Up: At [facility], you and your health needs are our priority.  As part of our continuing mission to provide you with exceptional heart care, we have created designated Provider Care Teams.  These Care Teams include your primary Cardiologist (physician) and Advanced Practice Providers (APPs -  Physician Assistants and Nurse Practitioners) who all work together to provide you with the care you need, when you need it.  Your next appointment:   6 month(s)  The format for your next appointment:   In Person  Provider:   You may see Fransico Him, MD or one of the following Advanced Practice Providers on your designated Care Team:    Melina Copa, PA-C  Ermalinda Barrios, PA-C

## 2019-07-19 ENCOUNTER — Encounter: Payer: Self-pay | Admitting: Adult Health

## 2019-07-29 ENCOUNTER — Ambulatory Visit (HOSPITAL_COMMUNITY)
Admission: RE | Admit: 2019-07-29 | Discharge: 2019-07-29 | Disposition: A | Discharge status: Home / Self Care | Payer: Medicare Other | Source: Ambulatory Visit | Attending: Physician Assistant | Admitting: Physician Assistant

## 2019-07-29 ENCOUNTER — Telehealth (HOSPITAL_COMMUNITY): Payer: Self-pay | Admitting: *Deleted

## 2019-07-29 ENCOUNTER — Other Ambulatory Visit: Payer: Self-pay

## 2019-07-29 VITALS — BP 130/82 | HR 140 | Ht 66.0 in | Wt 148.4 lb

## 2019-07-29 DIAGNOSIS — Z7901 Long term (current) use of anticoagulants: Secondary | ICD-10-CM | POA: Insufficient documentation

## 2019-07-29 DIAGNOSIS — I483 Typical atrial flutter: Secondary | ICD-10-CM | POA: Diagnosis not present

## 2019-07-29 DIAGNOSIS — Z79899 Other long term (current) drug therapy: Secondary | ICD-10-CM | POA: Diagnosis not present

## 2019-07-29 DIAGNOSIS — I25810 Atherosclerosis of coronary artery bypass graft(s) without angina pectoris: Secondary | ICD-10-CM | POA: Insufficient documentation

## 2019-07-29 DIAGNOSIS — I4892 Unspecified atrial flutter: Secondary | ICD-10-CM | POA: Insufficient documentation

## 2019-07-29 DIAGNOSIS — Q231 Congenital insufficiency of aortic valve: Secondary | ICD-10-CM | POA: Diagnosis not present

## 2019-07-29 DIAGNOSIS — D6869 Other thrombophilia: Secondary | ICD-10-CM | POA: Diagnosis not present

## 2019-07-29 DIAGNOSIS — I480 Paroxysmal atrial fibrillation: Secondary | ICD-10-CM | POA: Insufficient documentation

## 2019-07-29 DIAGNOSIS — R9431 Abnormal electrocardiogram [ECG] [EKG]: Secondary | ICD-10-CM | POA: Diagnosis not present

## 2019-07-29 DIAGNOSIS — J984 Other disorders of lung: Secondary | ICD-10-CM | POA: Insufficient documentation

## 2019-07-29 DIAGNOSIS — G4733 Obstructive sleep apnea (adult) (pediatric): Secondary | ICD-10-CM | POA: Insufficient documentation

## 2019-07-29 DIAGNOSIS — I252 Old myocardial infarction: Secondary | ICD-10-CM | POA: Diagnosis not present

## 2019-07-29 DIAGNOSIS — Z7952 Long term (current) use of systemic steroids: Secondary | ICD-10-CM | POA: Insufficient documentation

## 2019-07-29 DIAGNOSIS — Z951 Presence of aortocoronary bypass graft: Secondary | ICD-10-CM | POA: Insufficient documentation

## 2019-07-29 DIAGNOSIS — Z91018 Allergy to other foods: Secondary | ICD-10-CM | POA: Diagnosis not present

## 2019-07-29 DIAGNOSIS — N400 Enlarged prostate without lower urinary tract symptoms: Secondary | ICD-10-CM | POA: Insufficient documentation

## 2019-07-29 DIAGNOSIS — E785 Hyperlipidemia, unspecified: Secondary | ICD-10-CM | POA: Diagnosis not present

## 2019-07-29 DIAGNOSIS — Z792 Long term (current) use of antibiotics: Secondary | ICD-10-CM | POA: Diagnosis not present

## 2019-07-29 DIAGNOSIS — I10 Essential (primary) hypertension: Secondary | ICD-10-CM | POA: Insufficient documentation

## 2019-07-29 DIAGNOSIS — Z87891 Personal history of nicotine dependence: Secondary | ICD-10-CM | POA: Diagnosis not present

## 2019-07-29 DIAGNOSIS — M19079 Primary osteoarthritis, unspecified ankle and foot: Secondary | ICD-10-CM | POA: Insufficient documentation

## 2019-07-29 DIAGNOSIS — F329 Major depressive disorder, single episode, unspecified: Secondary | ICD-10-CM | POA: Diagnosis not present

## 2019-07-29 LAB — CBC
HCT: 43 % (ref 39.0–52.0)
Hemoglobin: 14.4 g/dL (ref 13.0–17.0)
MCH: 35.6 pg — ABNORMAL HIGH (ref 26.0–34.0)
MCHC: 33.5 g/dL (ref 30.0–36.0)
MCV: 106.4 fL — ABNORMAL HIGH (ref 80.0–100.0)
Platelets: 276 10*3/uL (ref 150–400)
RBC: 4.04 MIL/uL — ABNORMAL LOW (ref 4.22–5.81)
RDW: 13.1 % (ref 11.5–15.5)
WBC: 11.9 10*3/uL — ABNORMAL HIGH (ref 4.0–10.5)
nRBC: 0 % (ref 0.0–0.2)

## 2019-07-29 LAB — COMPREHENSIVE METABOLIC PANEL
ALT: 65 U/L — ABNORMAL HIGH (ref 0–44)
AST: 49 U/L — ABNORMAL HIGH (ref 15–41)
Albumin: 4.2 g/dL (ref 3.5–5.0)
Alkaline Phosphatase: 85 U/L (ref 38–126)
Anion gap: 12 (ref 5–15)
BUN: 19 mg/dL (ref 8–23)
CO2: 22 mmol/L (ref 22–32)
Calcium: 9 mg/dL (ref 8.9–10.3)
Chloride: 108 mmol/L (ref 98–111)
Creatinine, Ser: 1.36 mg/dL — ABNORMAL HIGH (ref 0.61–1.24)
GFR calc Af Amer: >60 mL/min (ref >60)
GFR calc non Af Amer: 52 mL/min — ABNORMAL LOW (ref >60)
Glucose, Bld: 122 mg/dL — ABNORMAL HIGH (ref 70–99)
Potassium: 4.2 mmol/L (ref 3.5–5.1)
Sodium: 142 mmol/L (ref 135–145)
Total Bilirubin: 0.7 mg/dL (ref 0.3–1.2)
Total Protein: 6.6 g/dL (ref 6.5–8.1)

## 2019-07-29 LAB — TSH: TSH: 1.537 u[IU]/mL (ref 0.350–4.500)

## 2019-07-29 MED ORDER — METOPROLOL SUCCINATE ER 100 MG PO TB24: ORAL_TABLET | ORAL | 3 refills | Status: DC

## 2019-07-29 MED ORDER — AMIODARONE HCL 200 MG PO TABS: ORAL_TABLET | ORAL | 0 refills | Status: DC

## 2019-07-29 NOTE — Progress Notes (Signed)
Primary Care Physician: Burman Freestone, MD Primary Cardiologist: Dr Radford Pax Primary Electrophysiologist: Dr Curt Bears Referring Physician: Dr Jules Husbands is a 74 y.o. male with a history of CAD with CABG in 1998,bicuspid AV with moderate to severe AI and dilated aortic rootfollowed by Dr. Clementeen Graham at [facility], dyslipidemia, HTN, OSA, atrial flutter and paroxysmal atrial fibrillation who presents for consultation in the [facility].  The patient was initially diagnosed with atrial fibrillation and atrial flutter after presented to [facility] ER with symptoms of palpitaitons and found to be in rapid atrial flutter. He underwent successful DCCV. He is on Eliquis for a CHADS2VASC score of 3. Patient saw Dr Radford Pax in follow up on 06/22/19 and reported that he was having increased palpitations. A 30 day event monitor was placed. Which did show an episode of afib HR 120. Patient reports that he did have symptoms of palpitations for about 1 hour which correspond to the event. He denies any significant alcohol use. There were no specific triggers that he could identify.   On follow up today, patient reports that over the last two days he has had elevated heart rates. He has taken his PRN BB with only minimal relief. He denies SOB or dizziness but does have some chest discomfort when his heart races. He does report that he has been on a steroid taper for the past week for foot pain. His event monitor is pending.  Today, he denies symptoms of shortness of breath, orthopnea, PND, lower extremity edema, dizziness, presyncope, syncope, snoring, daytime somnolence, bleeding, or neurologic sequela. The patient is tolerating medications without difficulties and is otherwise without complaint today.    Atrial Fibrillation Risk Factors:  he does have symptoms or diagnosis of sleep apnea. he is compliant with CPAP therapy. he does not have a history of rheumatic fever. he  does not have a history of alcohol use. The patient does not have a history of early familial atrial fibrillation or other arrhythmias.  he has a BMI of Body mass index is 23.95 kg/m.Marland Kitchen Filed Weights   07/29/19 1412  Weight: 67.3 kg    Family History  Problem Relation Age of Onset  . Arthritis Mother        deceased  . Hyperlipidemia Mother   . Diabetes Mother   . Arthritis Father   . Hyperlipidemia Father   . Heart disease Father 59  . Stroke Father 35       deceased  . Hypertension Father   . Diabetes Father   . Kidney disease Paternal Grandfather   . Colon cancer Neg Hx   . Colon polyps Neg Hx   . Esophageal cancer Neg Hx   . Stomach cancer Neg Hx   . Rectal cancer Neg Hx      Atrial Fibrillation Management history:  Previous antiarrhythmic drugs: none Previous cardioversions: 04/2019 [facility]) Previous ablations: none CHADS2VASC score: 3 Anticoagulation history: Eliquis   Past Medical History:  Diagnosis Date  . Anal fissure   . Arthritis   . Bicuspid aortic valve    with mild to moderate AR by echo 10/2017  . BPH (benign prostatic hyperplasia)   . CAD (coronary artery disease)    s/p CABG [facility] [place]  . Cataract    bilateral-removed  . Depression   . Dilated aortic root (HCC)    52mm aortic root and 37mm ascending aorta by echo 10/2017  . History of blood transfusion 1998   "  due to cardiac bypass surgery"  . Hyperlipidemia   . Hypertension   . Hypogonadism in male   . Kidney stones   . Migraine   . Myocardial infarction ([place])   . Retinal hemorrhage 11/04/2014  . Sleep apnea with use of continuous positive airway pressure (CPAP)    Past Surgical History:  Procedure Laterality Date  . cataracts    . COLONOSCOPY    . CORONARY ARTERY BYPASS GRAFT  1998  . LEFT HEART CATH AND CORONARY ANGIOGRAPHY N/A 08/24/2018   Procedure: LEFT HEART CATH AND CORONARY ANGIOGRAPHY;  Surgeon: Lorretta Harp, MD;  Location: [facility];  Service:  Cardiovascular;  Laterality: N/A;  . TEE WITHOUT CARDIOVERSION N/A 01/19/2015   Procedure: TRANSESOPHAGEAL ECHOCARDIOGRAM (TEE);  Surgeon: Sueanne Margarita, MD;  Location[facility] ENDOSCOPY;  Service: Cardiovascular;  Laterality: N/A;  . [place]   pt denies    Current Outpatient Medications  Medication Sig Dispense Refill  . ALPRAZolam (ALPRAZOLAM XR) 1 MG 24 hr tablet Take 1 tablet (1 mg total) by mouth daily. (Patient taking differently: Take 1 mg by mouth at bedtime. ) 30 tablet 5  . amLODipine (NORVASC) 10 MG tablet TAKE ONE TABLET BY MOUTH DAILY 90 tablet 3  . amoxicillin (AMOXIL) 500 MG capsule Take 4 tablets, 2 grams, 1 hour before each of your three dental procedures. 12 capsule 0  . apixaban (ELIQUIS) 5 MG TABS tablet Take 1 tablet (5 mg total) by mouth 2 (two) times daily. 180 tablet 3  . Ascorbic Acid (VITAMIN C) 1000 MG tablet Take 1,000 mg by mouth 2 (two) times daily.    Marland Kitchen aspirin-acetaminophen-caffeine (EXCEDRIN EXTRA STRENGTH) 250-250-65 MG tablet Take 2 tablets by mouth as needed for headache.     . butalbital-aspirin-caffeine-codeine (FIORINAL WITH CODEINE) 50-325-40-30 MG capsule TAKE ONE CAPSULE BY MOUTH EVERY 4 HOURS AS NEEDED FOR Migraine (Patient taking differently: as needed. TAKE ONE CAPSULE BY MOUTH EVERY 4 HOURS AS NEEDED FOR Migraine) 30 capsule 0  . diazepam (VALIUM) 5 MG tablet Take 2.5-5 mg by mouth as needed for anxiety.     Marland Kitchen ibuprofen (ADVIL,MOTRIN) 200 MG tablet Take 200-400 mg by mouth as needed (for arthritis in the feet).     Marland Kitchen losartan (COZAAR) 25 MG tablet TAKE ONE TABLET BY MOUTH DAILY 90 tablet 2  . metoprolol succinate (TOPROL-XL) 100 MG 24 hr tablet Take 1 tablet in the AM and 1/2 tablet in the PM 45 tablet 3  . metoprolol tartrate (LOPRESSOR) 25 MG tablet Take 1 tablet (25 mg total) by mouth every 6 (six) hours as needed (Heart Rate >100). 60 tablet 2  . niacin 500 MG tablet Take 500 mg by mouth at bedtime.    . nitroGLYCERIN (NITROSTAT)  0.4 MG SL tablet Place 1 tablet (0.4 mg total) under the tongue every 5 (five) minutes x 3 doses as needed for chest pain. 25 tablet 12  . predniSONE (DELTASONE) 5 MG tablet Take by mouth. Taking 1 tablet in the am and one in the evening    . rosuvastatin (CRESTOR) 20 MG tablet Take by mouth.    Marland Kitchen Specialty Vitamins Products (CENTRUM SPECIALIST ENERGY) TABS Take 1 tablet by mouth daily with breakfast.    . Testosterone (ANDROGEL) 40.5 MG/2.5GM (1.62%) GEL Place 1 application onto the skin See admin instructions. Apply 1 pump to each shoulder once a day    . TURMERIC PO Take 1 capsule by mouth daily with breakfast.     .  vitamin E 400 UNIT capsule Take 400 Units by mouth at bedtime.     Marland Kitchen zonisamide (ZONEGRAN) 25 MG capsule TAKE TWO CAPSULES BY MOUTH DAILY 180 capsule 3  . amiodarone (PACERONE) 200 MG tablet Take 1 tablet twice a day for 1 month then reduce to 1 tablet daily 60 tablet 0   Current Facility-Administered Medications  Medication Dose Route Frequency Provider Last Rate Last Admin  . 0.9 %  sodium chloride infusion  500 mL Intravenous Once [place], MD      . 0.9 %  sodium chloride infusion  500 mL Intravenous Once Armbruster, Carlota Raspberry, MD        Allergies  Allergen Reactions  . Mushroom Extract Complex Nausea Only and Other (See Comments)    Severe vertigo and headaches    Social History   Socioeconomic History  . Marital status: Divorced    Spouse name: Not on file  . Number of children: 0  . Years of education: Not on file  . Highest education level: Not on file  Occupational History  . Occupation: retired  Tobacco Use  . Smoking status: Former Smoker    Years: 20.00    Quit date: 07/22/1986    Years since quitting: 33.0  . Smokeless tobacco: Never Used  Substance and Sexual Activity  . Alcohol use: Yes    Comment: 0-1 daily  . Drug use: No  . Sexual activity: Not on file  Other Topics Concern  . Not on file  Social History Narrative   Divorced    Secondary school teacher- retired   No children   Has a Neurosurgeon named [name]   Enjoys outdoor activities- hiking, swimming, Control and instrumentation engineer, baseball, writing, reading, cooking   Social Determinants of Radio broadcast assistant Strain:   . Difficulty of Paying Living Expenses: Not on file  Food Insecurity:   . Worried About Charity fundraiser in the Last Year: Not on file  . Ran Out of Food in the Last Year: Not on file  Transportation Needs:   . Lack of Transportation (Medical): Not on file  . Lack of Transportation (Non-Medical): Not on file  Physical Activity:   . Days of Exercise per Week: Not on file  . Minutes of Exercise per Session: Not on file  Stress:   . Feeling of Stress : Not on file  Social Connections:   . Frequency of Communication with Friends and Family: Not on file  . Frequency of Social Gatherings with Friends and Family: Not on file  . Attends Religious Services: Not on file  . Active Member of Clubs or Organizations: Not on file  . Attends Archivist Meetings: Not on file  . Marital Status: Not on file  Intimate Partner Violence:   . Fear of Current or Ex-Partner: Not on file  . Emotionally Abused: Not on file  . Physically Abused: Not on file  . Sexually Abused: Not on file     ROS- All systems are reviewed and negative except as per the HPI above.  Physical Exam: Vitals:   07/29/19 1412  BP: 130/82  Pulse: (!) 140  Weight: 67.3 kg  Height: 5\' 6"  (1.676 m)    GEN- The patient is well appearing, alert and oriented x 3 today.   HEENT-head normocephalic, atraumatic, sclera clear, conjunctiva pink, hearing intact, trachea midline. Lungs- Clear to ausculation bilaterally, normal work of breathing Heart- Regular rhythm, tachycardia, no murmurs, rubs or gallops  GI- soft, NT, ND, +  BS Extremities- no clubbing, cyanosis, or edema MS- no significant deformity or atrophy Skin- no rash or lesion Psych- euthymic mood, full affect Neuro- strength and sensation  are intact   Wt Readings from Last 3 Encounters:  07/29/19 67.3 kg  07/13/19 67.6 kg  07/06/19 68.4 kg    EKG today demonstrates typical atrial flutter with 2:1 conduction HR 140, QRS 70, QTc 525  Echo 08/22/18 demonstrated   1. The left ventricle has normal systolic function of 123456. The cavity size is normal. There is mild concentric left ventricular hypertrophy. Echo evidence of normal diastolic filling patterns. Normal left ventricular filling pressures.  2. Normal left atrial size.  3. Normal right atrial size.  4. The mitral valve normal in structure. There is mild mitral annular calcification present. Regurgitation is trivial by color flow Doppler . No evidence of mitral valve stenosis.  5. Normal tricuspid valve.  6. The aortic valve possibly bicuspid. Aortic valve regurgitation is mild to moderate by color flow Doppler. The jet is eccentric posteriorly directed.  7. Moderate dilatation of the aortic root.  8. No atrial level shunt detected by color flow Doppler.  9. Consider further evaluation of the ascending aorta with CT angiography.  Epic records are reviewed at length today  Assessment and Plan:  1. Paroxysmal atrial fibrillation/typical atrial flutter Patient in rapid atrial flutter. ? Triggered by steroid use. We discussed therapeutic options today including AAD, DCCV, and ablation.  After discussing the risks and benefits, will start amiodarone 200 mg BID for one month then decrease to 200 mg daily. Will also refer to Dr Curt Bears to consider ablation. Will arrange for DCCV.  Check TSH/Cmet/CBC Increase Toprol to 100 mg AM and 50 mg PM. Discontinue PM dose after DCCV.  Continue Eliquis 5 mg BID (started 12/14) Continue Toprol 100 mg daily. Continue Lopressor 25 mg PRN q 6 hrs for heart racing.   This patients CHA2DS2-VASc Score and unadjusted Ischemic Stroke Rate (% per year) is equal to 3.2 % stroke rate/year from a score of 3  Above score calculated as 1 point  each if present [CHF, HTN, DM, Vascular=MI/PAD/Aortic Plaque, Age if 65-74, or Male] Above score calculated as 2 points each if present [Age > 75, or Stroke/TIA/TE]   2. Obstructive sleep apnea The importance of adequate treatment of sleep apnea was discussed today in order to improve our ability to maintain sinus rhythm long term. Patient compliant with CPAP therapy.  3. HTN Stable, med changes as above.  4. CAD No anginal symptoms. Followed by Dr Radford Pax.   Follow up in the AF clinic in one week as scheduled.    [facility] [address] [place], [place] [address] [phone] 07/29/2019 3:47 PM

## 2019-07-29 NOTE — Telephone Encounter (Signed)
Patient called in stating went back into AF last PM. HR running in the 120-130s. Pt does not know BP at this time but states it usually runs ok. Talked with the oncall physician last night recommended taking metoprolol tartrate 25mg  PRN and follow up in AF Clinic today. Pt takes his metoprolol succinate 100mg  in the PM (which he took last night at 6pm). Will bring in this afternoon.

## 2019-07-29 NOTE — Patient Instructions (Addendum)
Start Amiodarone 200mg  twice a day with food for 1 month then reduce to 1 tablet a day   Increase metoprolol to 100mg  in the morning and 50mg  in the evening    Cardioversion scheduled for Tuesday, January 19th  - Arrive at the [facility] and go to admitting at [facility] not eat or drink anything after midnight the night prior to your procedure.  - Take all your medication with a sip of water prior to arrival.  - Do not miss any doses of your Eliquis - if you should please notify our office immediately.  - You will not be able to drive home after your procedure.

## 2019-08-05 ENCOUNTER — Other Ambulatory Visit: Payer: Self-pay | Admitting: Cardiology

## 2019-08-05 ENCOUNTER — Ambulatory Visit: Payer: Medicare Other | Admitting: Audiology

## 2019-08-05 DIAGNOSIS — I493 Ventricular premature depolarization: Secondary | ICD-10-CM

## 2019-08-05 DIAGNOSIS — I4892 Unspecified atrial flutter: Secondary | ICD-10-CM

## 2019-08-06 ENCOUNTER — Ambulatory Visit (HOSPITAL_COMMUNITY)
Admission: RE | Admit: 2019-08-06 | Discharge: 2019-08-06 | Disposition: A | Discharge status: Home / Self Care | Payer: Medicare Other | Source: Ambulatory Visit | Attending: Physician Assistant | Admitting: Physician Assistant

## 2019-08-06 ENCOUNTER — Other Ambulatory Visit: Payer: Self-pay

## 2019-08-06 ENCOUNTER — Encounter (HOSPITAL_COMMUNITY): Payer: Self-pay | Admitting: Physician Assistant

## 2019-08-06 ENCOUNTER — Other Ambulatory Visit (HOSPITAL_COMMUNITY)
Admission: RE | Admit: 2019-08-06 | Discharge: 2019-08-06 | Disposition: A | Discharge status: Home / Self Care | Payer: Medicare Other | Source: Ambulatory Visit | Attending: Cardiovascular Disease | Admitting: Cardiovascular Disease

## 2019-08-06 VITALS — BP 128/86 | HR 56 | Ht 66.0 in | Wt 149.4 lb

## 2019-08-06 DIAGNOSIS — I252 Old myocardial infarction: Secondary | ICD-10-CM | POA: Insufficient documentation

## 2019-08-06 DIAGNOSIS — R9431 Abnormal electrocardiogram [ECG] [EKG]: Secondary | ICD-10-CM | POA: Insufficient documentation

## 2019-08-06 DIAGNOSIS — Z20822 Contact with and (suspected) exposure to covid-19: Secondary | ICD-10-CM | POA: Insufficient documentation

## 2019-08-06 DIAGNOSIS — M1990 Unspecified osteoarthritis, unspecified site: Secondary | ICD-10-CM | POA: Diagnosis not present

## 2019-08-06 DIAGNOSIS — E785 Hyperlipidemia, unspecified: Secondary | ICD-10-CM | POA: Insufficient documentation

## 2019-08-06 DIAGNOSIS — Z833 Family history of diabetes mellitus: Secondary | ICD-10-CM | POA: Diagnosis not present

## 2019-08-06 DIAGNOSIS — I483 Typical atrial flutter: Secondary | ICD-10-CM | POA: Insufficient documentation

## 2019-08-06 DIAGNOSIS — I4430 Unspecified atrioventricular block: Secondary | ICD-10-CM | POA: Insufficient documentation

## 2019-08-06 DIAGNOSIS — D6869 Other thrombophilia: Secondary | ICD-10-CM

## 2019-08-06 DIAGNOSIS — Z951 Presence of aortocoronary bypass graft: Secondary | ICD-10-CM | POA: Diagnosis not present

## 2019-08-06 DIAGNOSIS — Z87891 Personal history of nicotine dependence: Secondary | ICD-10-CM | POA: Insufficient documentation

## 2019-08-06 DIAGNOSIS — Q231 Congenital insufficiency of aortic valve: Secondary | ICD-10-CM | POA: Insufficient documentation

## 2019-08-06 DIAGNOSIS — G4733 Obstructive sleep apnea (adult) (pediatric): Secondary | ICD-10-CM | POA: Diagnosis not present

## 2019-08-06 DIAGNOSIS — Z792 Long term (current) use of antibiotics: Secondary | ICD-10-CM | POA: Diagnosis not present

## 2019-08-06 DIAGNOSIS — F329 Major depressive disorder, single episode, unspecified: Secondary | ICD-10-CM | POA: Insufficient documentation

## 2019-08-06 DIAGNOSIS — Z823 Family history of stroke: Secondary | ICD-10-CM | POA: Insufficient documentation

## 2019-08-06 DIAGNOSIS — Z91018 Allergy to other foods: Secondary | ICD-10-CM | POA: Diagnosis not present

## 2019-08-06 DIAGNOSIS — Z7952 Long term (current) use of systemic steroids: Secondary | ICD-10-CM | POA: Diagnosis not present

## 2019-08-06 DIAGNOSIS — I10 Essential (primary) hypertension: Secondary | ICD-10-CM | POA: Insufficient documentation

## 2019-08-06 DIAGNOSIS — Z7901 Long term (current) use of anticoagulants: Secondary | ICD-10-CM | POA: Diagnosis not present

## 2019-08-06 DIAGNOSIS — Z79899 Other long term (current) drug therapy: Secondary | ICD-10-CM | POA: Insufficient documentation

## 2019-08-06 DIAGNOSIS — Z8249 Family history of ischemic heart disease and other diseases of the circulatory system: Secondary | ICD-10-CM | POA: Diagnosis not present

## 2019-08-06 DIAGNOSIS — Z8261 Family history of arthritis: Secondary | ICD-10-CM | POA: Diagnosis not present

## 2019-08-06 DIAGNOSIS — I25810 Atherosclerosis of coronary artery bypass graft(s) without angina pectoris: Secondary | ICD-10-CM | POA: Insufficient documentation

## 2019-08-06 MED ORDER — AMIODARONE HCL 200 MG PO TABS: ORAL_TABLET | ORAL | 0 refills | Status: DC

## 2019-08-06 MED ORDER — METOPROLOL SUCCINATE ER 100 MG PO TB24: 50.0000 mg | ORAL_TABLET | ORAL | Status: DC

## 2019-08-06 NOTE — H&P (View-Only) (Signed)
Primary Care Physician: Burman Freestone, MD Primary Cardiologist: Dr Radford Pax Primary Electrophysiologist: Dr Curt Bears Referring Physician: Dr Jules Husbands is a 74 y.o. male with a history of CAD with CABG in 1998,bicuspid AV with moderate to severe AI and dilated aortic rootfollowed by Dr. Clementeen [facility] at [facility], dyslipidemia, HTN, OSA, atrial flutter who presents for follow up in the [facility].  The patient was initially diagnosed with atrial flutter after presented to [facility] ER with symptoms of palpitations. He underwent successful DCCV. He is on Eliquis for a CHADS2VASC score of 3. Patient saw Dr Radford Pax in follow up on 06/22/19 and reported that he was having increased palpitations. A 30 day event monitor was placed. Which did show an episode of atrial flutter with HR 120. Patient reports that he did have symptoms of palpitations for about 1 hour which correspond to the event. He denies any significant alcohol use. There were no specific triggers that he could identify.   On follow up today, patient reports the he is feeling better with controlled heart rates. He continues to have symptoms of palpitations. He is tolerating the medication without difficulty. He denies missed doses of anticoagulation.  Today, he denies symptoms of shortness of breath, orthopnea, PND, lower extremity edema, dizziness, presyncope, syncope, snoring, daytime somnolence, bleeding, or neurologic sequela. The patient is tolerating medications without difficulties and is otherwise without complaint today.    Atrial Fibrillation Risk Factors:  he does have symptoms or diagnosis of sleep apnea. he is compliant with CPAP therapy. he does not have a history of rheumatic fever. he does not have a history of alcohol use. The patient does not have a history of early familial atrial fibrillation or other arrhythmias.  he has a BMI of Body mass index is 24.11 kg/m.Marland Kitchen Filed  Weights   08/06/19 0832  Weight: 67.8 kg    Family History  Problem Relation Age of Onset  . Arthritis Mother        deceased  . Hyperlipidemia Mother   . Diabetes Mother   . Arthritis Father   . Hyperlipidemia Father   . Heart disease Father 64  . Stroke Father 41       deceased  . Hypertension Father   . Diabetes Father   . Kidney disease Paternal Grandfather   . Colon cancer Neg Hx   . Colon polyps Neg Hx   . Esophageal cancer Neg Hx   . Stomach cancer Neg Hx   . Rectal cancer Neg Hx      Atrial Fibrillation Management history:  Previous antiarrhythmic drugs: amiodarone Previous cardioversions: 04/2019 [facility]) Previous ablations: none CHADS2VASC score: 3 Anticoagulation history: Eliquis   Past Medical History:  Diagnosis Date  . Anal fissure   . Arthritis   . Bicuspid aortic valve    with mild to moderate AR by echo 10/2017  . BPH (benign prostatic hyperplasia)   . CAD (coronary artery disease)    s/p CABG [facility] [facility]  . Cataract    bilateral-removed  . Depression   . Dilated aortic root (HCC)    22mm aortic root and 65mm ascending aorta by echo 10/2017  . History of blood transfusion 1998   "due to cardiac bypass surgery"  . Hyperlipidemia   . Hypertension   . Hypogonadism in male   . Kidney stones   . Migraine   . Myocardial infarction ([place])   . Retinal hemorrhage 11/04/2014  .  Sleep apnea with use of continuous positive airway pressure (CPAP)    Past Surgical History:  Procedure Laterality Date  . cataracts    . COLONOSCOPY    . CORONARY ARTERY BYPASS GRAFT  1998  . LEFT HEART CATH AND CORONARY ANGIOGRAPHY N/A 08/24/2018   Procedure: LEFT HEART CATH AND CORONARY ANGIOGRAPHY;  Surgeon: Lorretta Harp, MD;  Location: [place] [facility];  Service: Cardiovascular;  Laterality: N/A;  . TEE WITHOUT CARDIOVERSION N/A 01/19/2015   Procedure: TRANSESOPHAGEAL ECHOCARDIOGRAM (TEE);  Surgeon: Sueanne Margarita, MD;  Location: [facility] ENDOSCOPY;   Service: Cardiovascular;  Laterality: N/A;  . [place]   pt denies    Current Outpatient Medications  Medication Sig Dispense Refill  . ALPRAZolam (ALPRAZOLAM XR) 1 MG 24 hr tablet Take 1 tablet (1 mg total) by mouth daily. (Patient taking differently: Take 1 mg by mouth at bedtime. ) 30 tablet 5  . amiodarone (PACERONE) 200 MG tablet Take 1 tablet twice a day until follow up with Dr. Curt Bears 1/26 60 tablet 0  . amLODipine (NORVASC) 10 MG tablet TAKE ONE TABLET BY MOUTH DAILY (Patient taking differently: Take 10 mg by mouth daily. ) 90 tablet 3  . amoxicillin (AMOXIL) 500 MG capsule Take 4 tablets, 2 grams, 1 hour before each of your three dental procedures. (Patient taking differently: Take 2,000 mg by mouth See admin instructions. Take 4 capsules (2000 mg) by mouth 1 hour prior to dental procedure.) 12 capsule 0  . apixaban (ELIQUIS) 5 MG TABS tablet Take 1 tablet (5 mg total) by mouth 2 (two) times daily. 180 tablet 3  . Ascorbic Acid (VITAMIN C) 1000 MG tablet Take 1,000 mg by mouth 2 (two) times daily.    Marland Kitchen aspirin-acetaminophen-caffeine (EXCEDRIN EXTRA STRENGTH) 250-250-65 MG tablet Take 2 tablets by mouth daily as needed for headache.     . butalbital-aspirin-caffeine-codeine (FIORINAL WITH CODEINE) 50-325-40-30 MG capsule TAKE ONE CAPSULE BY MOUTH EVERY 4 HOURS AS NEEDED FOR Migraine (Patient taking differently: Take 1 capsule by mouth every 4 (four) hours as needed (migraine). ) 30 capsule 0  . diazepam (VALIUM) 5 MG tablet Take 2.5-5 mg by mouth 2 (two) times daily as needed for anxiety.     Marland Kitchen ibuprofen (ADVIL,MOTRIN) 200 MG tablet Take 200-400 mg by mouth 2 (two) times daily as needed (for arthritis in the feet).     Marland Kitchen losartan (COZAAR) 25 MG tablet TAKE ONE TABLET BY MOUTH DAILY (Patient taking differently: Take 25 mg by mouth daily. ) 90 tablet 2  . metoprolol succinate (TOPROL-XL) 100 MG 24 hr tablet Take 1 tablet (100 mg total) by mouth See admin instructions. Take  1 tablet (100 mg) by mouth in the morning & take 0.5 tablet (50 mg) by mouth at night(stop night dose prior to cardioversion)    . metoprolol tartrate (LOPRESSOR) 25 MG tablet Take 1 tablet (25 mg total) by mouth every 6 (six) hours as needed (Heart Rate >100). 60 tablet 2  . niacin 500 MG tablet Take 500 mg by mouth at bedtime.    . nitroGLYCERIN (NITROSTAT) 0.4 MG SL tablet Place 1 tablet (0.4 mg total) under the tongue every 5 (five) minutes x 3 doses as needed for chest pain. 25 tablet 12  . predniSONE (DELTASONE) 5 MG tablet Take 5 mg by mouth 2 (two) times daily.     . rosuvastatin (CRESTOR) 20 MG tablet Take 20 mg by mouth every evening.     Marland Kitchen Specialty  Vitamins Products (CENTRUM SPECIALIST ENERGY) TABS Take 1 tablet by mouth daily with breakfast.    . Testosterone (ANDROGEL) 40.5 MG/2.5GM (1.62%) GEL Place 1 application onto the skin daily. Apply 1 pump to each shoulder once a day    . TURMERIC PO Take 1 capsule by mouth daily with breakfast.     . vitamin E 400 UNIT capsule Take 400 Units by mouth at bedtime.     Marland Kitchen zonisamide (ZONEGRAN) 25 MG capsule TAKE TWO CAPSULES BY MOUTH DAILY (Patient taking differently: Take 25 mg by mouth 2 (two) times daily. ) 180 capsule 3   Current Facility-Administered Medications  Medication Dose Route Frequency Provider Last Rate Last Admin  . 0.9 %  sodium chloride infusion  500 mL Intravenous Once [place], MD      . 0.9 %  sodium chloride infusion  500 mL Intravenous Once Armbruster, Carlota Raspberry, MD        Allergies  Allergen Reactions  . Mushroom Extract Complex Nausea Only and Other (See Comments)    Severe vertigo and headaches    Social History   Socioeconomic History  . Marital status: Divorced    Spouse name: Not on file  . Number of children: 0  . Years of education: Not on file  . Highest education level: Not on file  Occupational History  . Occupation: retired  Tobacco Use  . Smoking status: Former Smoker    Years: 20.00      Quit date: 07/22/1986    Years since quitting: 33.0  . Smokeless tobacco: Never Used  Substance and Sexual Activity  . Alcohol use: Yes    Alcohol/week: 1.0 standard drinks    Types: 1 Glasses of wine per week    Comment: 0-1 daily  . Drug use: No  . Sexual activity: Not on file  Other Topics Concern  . Not on file  Social History Narrative   Divorced   Secondary school teacher- retired   No children   Has a Neurosurgeon named [name]   Enjoys outdoor activities- hiking, swimming, Control and instrumentation engineer, baseball, writing, reading, cooking   Social Determinants of Radio broadcast assistant Strain:   . Difficulty of Paying Living Expenses: Not on file  Food Insecurity:   . Worried About Charity fundraiser in the Last Year: Not on file  . Ran Out of Food in the Last Year: Not on file  Transportation Needs:   . Lack of Transportation (Medical): Not on file  . Lack of Transportation (Non-Medical): Not on file  Physical Activity:   . Days of Exercise per Week: Not on file  . Minutes of Exercise per Session: Not on file  Stress:   . Feeling of Stress : Not on file  Social Connections:   . Frequency of Communication with Friends and Family: Not on file  . Frequency of Social Gatherings with Friends and Family: Not on file  . Attends Religious Services: Not on file  . Active Member of Clubs or Organizations: Not on file  . Attends Archivist Meetings: Not on file  . Marital Status: Not on file  Intimate Partner Violence:   . Fear of Current or Ex-Partner: Not on file  . Emotionally Abused: Not on file  . Physically Abused: Not on file  . Sexually Abused: Not on file     ROS- All systems are reviewed and negative except as per the HPI above.  Physical Exam: Vitals:   08/06/19 0832  BP: 128/86  Pulse: (!) 56  Weight: 67.8 kg  Height: 5\' 6"  (1.676 m)   GEN- The patient is well appearing, alert and oriented x 3 today.   HEENT-head normocephalic, atraumatic, sclera clear, conjunctiva  pink, hearing intact, trachea midline. Lungs- Clear to ausculation bilaterally, normal work of breathing Heart- irregular rate and rhythm, no murmurs, rubs or gallops  GI- soft, NT, ND, + BS Extremities- no clubbing, cyanosis, or edema MS- no significant deformity or atrophy Skin- no rash or lesion Psych- euthymic mood, full affect Neuro- strength and sensation are intact   Wt Readings from Last 3 Encounters:  08/06/19 67.8 kg  07/29/19 67.3 kg  07/13/19 67.6 kg    EKG today demonstrates typical atrial flutter with variable conduction HR 56, PVC, QRS 110, QTc 409  Echo 08/22/18 demonstrated   1. The left ventricle has normal systolic function of 123456. The cavity size is normal. There is mild concentric left ventricular hypertrophy. Echo evidence of normal diastolic filling patterns. Normal left ventricular filling pressures.  2. Normal left atrial size.  3. Normal right atrial size.  4. The mitral valve normal in structure. There is mild mitral annular calcification present. Regurgitation is trivial by color flow Doppler . No evidence of mitral valve stenosis.  5. Normal tricuspid valve.  6. The aortic valve possibly bicuspid. Aortic valve regurgitation is mild to moderate by color flow Doppler. The jet is eccentric posteriorly directed.  7. Moderate dilatation of the aortic root.  8. No atrial level shunt detected by color flow Doppler.  9. Consider further evaluation of the ascending aorta with CT angiography.  Epic records are reviewed at length today  Assessment and Plan:  1. Typical atrial flutter Patient in rate controlled atrial flutter. Event monitor shows only atrial flutter, no atrial fibrillation noted.  Continue amiodarone 200 mg BID for now. Plan to decrease after DCCV. DCCV scheduled. Patient has appt to discuss flutter ablation. Continue Toprol to 100 mg AM and 50 mg PM. Discontinue PM dose after DCCV.  Continue Eliquis 5 mg BID (started 12/14) Continue  Lopressor 25 mg PRN q 6 hrs for heart racing.   This patients CHA2DS2-VASc Score and unadjusted Ischemic Stroke Rate (% per year) is equal to 3.2 % stroke rate/year from a score of 3  Above score calculated as 1 point each if present [CHF, HTN, DM, Vascular=MI/PAD/Aortic Plaque, Age if 65-74, or Male] Above score calculated as 2 points each if present [Age > 75, or Stroke/TIA/TE]   2. Obstructive sleep apnea The importance of adequate treatment of sleep apnea was discussed today in order to improve our ability to maintain sinus rhythm long term. Patient compliant with CPAP therapy.  3. HTN Stable, med changes as above.  4. CAD No anginal symptoms. Followed by Dr Radford Pax.   Follow up with Dr Curt Bears as scheduled.   [facility] [address] [place], [place] [address] [phone] 08/06/2019 8:58 AM

## 2019-08-06 NOTE — Progress Notes (Signed)
Primary Care Physician: Burman Freestone, MD Primary Cardiologist: Dr Radford Pax Primary Electrophysiologist: Dr Curt Bears Referring Physician: Dr Jules Husbands is a 74 y.o. male with a history of CAD with CABG in 1998,bicuspid AV with moderate to severe AI and dilated aortic rootfollowed by Dr. Clementeen Graham at [facility], dyslipidemia, HTN, OSA, atrial flutter who presents for follow up in the [facility].  The patient was initially diagnosed with atrial flutter after presented to [facility] ER with symptoms of palpitations. He underwent successful DCCV. He is on Eliquis for a CHADS2VASC score of 3. Patient saw Dr Radford Pax in follow up on 06/22/19 and reported that he was having increased palpitations. A 30 day event monitor was placed. Which did show an episode of atrial flutter with HR 120. Patient reports that he did have symptoms of palpitations for about 1 hour which correspond to the event. He denies any significant alcohol use. There were no specific triggers that he could identify.   On follow up today, patient reports the he is feeling better with controlled heart rates. He continues to have symptoms of palpitations. He is tolerating the medication without difficulty. He denies missed doses of anticoagulation.  Today, he denies symptoms of shortness of breath, orthopnea, PND, lower extremity edema, dizziness, presyncope, syncope, snoring, daytime somnolence, bleeding, or neurologic sequela. The patient is tolerating medications without difficulties and is otherwise without complaint today.    Atrial Fibrillation Risk Factors:  he does have symptoms or diagnosis of sleep apnea. he is compliant with CPAP therapy. he does not have a history of rheumatic fever. he does not have a history of alcohol use. The patient does not have a history of early familial atrial fibrillation or other arrhythmias.  he has a BMI of Body mass index is 24.11 kg/m.Marland Kitchen Filed  Weights   08/06/19 0832  Weight: 67.8 kg    Family History  Problem Relation Age of Onset  . Arthritis Mother        deceased  . Hyperlipidemia Mother   . Diabetes Mother   . Arthritis Father   . Hyperlipidemia Father   . Heart disease Father 62  . Stroke Father 59       deceased  . Hypertension Father   . Diabetes Father   . Kidney disease Paternal Grandfather   . Colon cancer Neg Hx   . Colon polyps Neg Hx   . Esophageal cancer Neg Hx   . Stomach cancer Neg Hx   . Rectal cancer Neg Hx      Atrial Fibrillation Management history:  Previous antiarrhythmic drugs: amiodarone Previous cardioversions: 04/2019 [facility]) Previous ablations: none CHADS2VASC score: 3 Anticoagulation history: Eliquis   Past Medical History:  Diagnosis Date  . Anal fissure   . Arthritis   . Bicuspid aortic valve    with mild to moderate AR by echo 10/2017  . BPH (benign prostatic hyperplasia)   . CAD (coronary artery disease)    s/p CABG [facility] [place]  . Cataract    bilateral-removed  . Depression   . Dilated aortic root (HCC)    17mm aortic root and 65mm ascending aorta by echo 10/2017  . History of blood transfusion 1998   "due to cardiac bypass surgery"  . Hyperlipidemia   . Hypertension   . Hypogonadism in male   . Kidney stones   . Migraine   . Myocardial infarction ([place])   . Retinal hemorrhage 11/04/2014  .  Sleep apnea with use of continuous positive airway pressure (CPAP)    Past Surgical History:  Procedure Laterality Date  . cataracts    . COLONOSCOPY    . CORONARY ARTERY BYPASS GRAFT  1998  . LEFT HEART CATH AND CORONARY ANGIOGRAPHY N/A 08/24/2018   Procedure: LEFT HEART CATH AND CORONARY ANGIOGRAPHY;  Surgeon: Lorretta Harp, MD;  Location: [place] CV [facility];  Service: Cardiovascular;  Laterality: N/A;  . TEE WITHOUT CARDIOVERSION N/A 01/19/2015   Procedure: TRANSESOPHAGEAL ECHOCARDIOGRAM (TEE);  Surgeon: Sueanne Margarita, MD;  Location: [facility] ENDOSCOPY;   Service: Cardiovascular;  Laterality: N/A;  . [place]   pt denies    Current Outpatient Medications  Medication Sig Dispense Refill  . ALPRAZolam (ALPRAZOLAM XR) 1 MG 24 hr tablet Take 1 tablet (1 mg total) by mouth daily. (Patient taking differently: Take 1 mg by mouth at bedtime. ) 30 tablet 5  . amiodarone (PACERONE) 200 MG tablet Take 1 tablet twice a day until follow up with Dr. Curt Bears 1/26 60 tablet 0  . amLODipine (NORVASC) 10 MG tablet TAKE ONE TABLET BY MOUTH DAILY (Patient taking differently: Take 10 mg by mouth daily. ) 90 tablet 3  . amoxicillin (AMOXIL) 500 MG capsule Take 4 tablets, 2 grams, 1 hour before each of your three dental procedures. (Patient taking differently: Take 2,000 mg by mouth See admin instructions. Take 4 capsules (2000 mg) by mouth 1 hour prior to dental procedure.) 12 capsule 0  . apixaban (ELIQUIS) 5 MG TABS tablet Take 1 tablet (5 mg total) by mouth 2 (two) times daily. 180 tablet 3  . Ascorbic Acid (VITAMIN C) 1000 MG tablet Take 1,000 mg by mouth 2 (two) times daily.    Marland Kitchen aspirin-acetaminophen-caffeine (EXCEDRIN EXTRA STRENGTH) 250-250-65 MG tablet Take 2 tablets by mouth daily as needed for headache.     . butalbital-aspirin-caffeine-codeine (FIORINAL WITH CODEINE) 50-325-40-30 MG capsule TAKE ONE CAPSULE BY MOUTH EVERY 4 HOURS AS NEEDED FOR Migraine (Patient taking differently: Take 1 capsule by mouth every 4 (four) hours as needed (migraine). ) 30 capsule 0  . diazepam (VALIUM) 5 MG tablet Take 2.5-5 mg by mouth 2 (two) times daily as needed for anxiety.     Marland Kitchen ibuprofen (ADVIL,MOTRIN) 200 MG tablet Take 200-400 mg by mouth 2 (two) times daily as needed (for arthritis in the feet).     Marland Kitchen losartan (COZAAR) 25 MG tablet TAKE ONE TABLET BY MOUTH DAILY (Patient taking differently: Take 25 mg by mouth daily. ) 90 tablet 2  . metoprolol succinate (TOPROL-XL) 100 MG 24 hr tablet Take 1 tablet (100 mg total) by mouth See admin instructions. Take  1 tablet (100 mg) by mouth in the morning & take 0.5 tablet (50 mg) by mouth at night(stop night dose prior to cardioversion)    . metoprolol tartrate (LOPRESSOR) 25 MG tablet Take 1 tablet (25 mg total) by mouth every 6 (six) hours as needed (Heart Rate >100). 60 tablet 2  . niacin 500 MG tablet Take 500 mg by mouth at bedtime.    . nitroGLYCERIN (NITROSTAT) 0.4 MG SL tablet Place 1 tablet (0.4 mg total) under the tongue every 5 (five) minutes x 3 doses as needed for chest pain. 25 tablet 12  . predniSONE (DELTASONE) 5 MG tablet Take 5 mg by mouth 2 (two) times daily.     . rosuvastatin (CRESTOR) 20 MG tablet Take 20 mg by mouth every evening.     Marland Kitchen Specialty  Vitamins Products (CENTRUM SPECIALIST ENERGY) TABS Take 1 tablet by mouth daily with breakfast.    . Testosterone (ANDROGEL) 40.5 MG/2.5GM (1.62%) GEL Place 1 application onto the skin daily. Apply 1 pump to each shoulder once a day    . TURMERIC PO Take 1 capsule by mouth daily with breakfast.     . vitamin E 400 UNIT capsule Take 400 Units by mouth at bedtime.     Marland Kitchen zonisamide (ZONEGRAN) 25 MG capsule TAKE TWO CAPSULES BY MOUTH DAILY (Patient taking differently: Take 25 mg by mouth 2 (two) times daily. ) 180 capsule 3   Current Facility-Administered Medications  Medication Dose Route Frequency Provider Last Rate Last Admin  . 0.9 %  sodium chloride infusion  500 mL Intravenous Once [place], MD      . 0.9 %  sodium chloride infusion  500 mL Intravenous Once Armbruster, Carlota Raspberry, MD        Allergies  Allergen Reactions  . Mushroom Extract Complex Nausea Only and Other (See Comments)    Severe vertigo and headaches    Social History   Socioeconomic History  . Marital status: Divorced    Spouse name: Not on file  . Number of children: 0  . Years of education: Not on file  . Highest education level: Not on file  Occupational History  . Occupation: retired  Tobacco Use  . Smoking status: Former Smoker    Years: 20.00      Quit date: 07/22/1986    Years since quitting: 33.0  . Smokeless tobacco: Never Used  Substance and Sexual Activity  . Alcohol use: Yes    Alcohol/week: 1.0 standard drinks    Types: 1 Glasses of wine per week    Comment: 0-1 daily  . Drug use: No  . Sexual activity: Not on file  Other Topics Concern  . Not on file  Social History Narrative   Divorced   Secondary school teacher- retired   No children   Has a Neurosurgeon named [name]   Enjoys outdoor activities- hiking, swimming, Control and instrumentation engineer, baseball, writing, reading, cooking   Social Determinants of Radio broadcast assistant Strain:   . Difficulty of Paying Living Expenses: Not on file  Food Insecurity:   . Worried About Charity fundraiser in the Last Year: Not on file  . Ran Out of Food in the Last Year: Not on file  Transportation Needs:   . Lack of Transportation (Medical): Not on file  . Lack of Transportation (Non-Medical): Not on file  Physical Activity:   . Days of Exercise per Week: Not on file  . Minutes of Exercise per Session: Not on file  Stress:   . Feeling of Stress : Not on file  Social Connections:   . Frequency of Communication with Friends and Family: Not on file  . Frequency of Social Gatherings with Friends and Family: Not on file  . Attends Religious Services: Not on file  . Active Member of Clubs or Organizations: Not on file  . Attends Archivist Meetings: Not on file  . Marital Status: Not on file  Intimate Partner Violence:   . Fear of Current or Ex-Partner: Not on file  . Emotionally Abused: Not on file  . Physically Abused: Not on file  . Sexually Abused: Not on file     ROS- All systems are reviewed and negative except as per the HPI above.  Physical Exam: Vitals:   08/06/19 0832  BP: 128/86  Pulse: (!) 56  Weight: 67.8 kg  Height: 5\' 6"  (1.676 m)   GEN- The patient is well appearing, alert and oriented x 3 today.   HEENT-head normocephalic, atraumatic, sclera clear, conjunctiva  pink, hearing intact, trachea midline. Lungs- Clear to ausculation bilaterally, normal work of breathing Heart- irregular rate and rhythm, no murmurs, rubs or gallops  GI- soft, NT, ND, + BS Extremities- no clubbing, cyanosis, or edema MS- no significant deformity or atrophy Skin- no rash or lesion Psych- euthymic mood, full affect Neuro- strength and sensation are intact   Wt Readings from Last 3 Encounters:  08/06/19 67.8 kg  07/29/19 67.3 kg  07/13/19 67.6 kg    EKG today demonstrates typical atrial flutter with variable conduction HR 56, PVC, QRS 110, QTc 409  Echo 08/22/18 demonstrated   1. The left ventricle has normal systolic function of 123456. The cavity size is normal. There is mild concentric left ventricular hypertrophy. Echo evidence of normal diastolic filling patterns. Normal left ventricular filling pressures.  2. Normal left atrial size.  3. Normal right atrial size.  4. The mitral valve normal in structure. There is mild mitral annular calcification present. Regurgitation is trivial by color flow Doppler . No evidence of mitral valve stenosis.  5. Normal tricuspid valve.  6. The aortic valve possibly bicuspid. Aortic valve regurgitation is mild to moderate by color flow Doppler. The jet is eccentric posteriorly directed.  7. Moderate dilatation of the aortic root.  8. No atrial level shunt detected by color flow Doppler.  9. Consider further evaluation of the ascending aorta with CT angiography.  Epic records are reviewed at length today  Assessment and Plan:  1. Typical atrial flutter Patient in rate controlled atrial flutter. Event monitor shows only atrial flutter, no atrial fibrillation noted.  Continue amiodarone 200 mg BID for now. Plan to decrease after DCCV. DCCV scheduled. Patient has appt to discuss flutter ablation. Continue Toprol to 100 mg AM and 50 mg PM. Discontinue PM dose after DCCV.  Continue Eliquis 5 mg BID (started 12/14) Continue  Lopressor 25 mg PRN q 6 hrs for heart racing.   This patients CHA2DS2-VASc Score and unadjusted Ischemic Stroke Rate (% per year) is equal to 3.2 % stroke rate/year from a score of 3  Above score calculated as 1 point each if present [CHF, HTN, DM, Vascular=MI/PAD/Aortic Plaque, Age if 65-74, or Male] Above score calculated as 2 points each if present [Age > 75, or Stroke/TIA/TE]   2. Obstructive sleep apnea The importance of adequate treatment of sleep apnea was discussed today in order to improve our ability to maintain sinus rhythm long term. Patient compliant with CPAP therapy.  3. HTN Stable, med changes as above.  4. CAD No anginal symptoms. Followed by Dr Radford Pax.   Follow up with Dr Curt Bears as scheduled.   [facility] [address] [place], [place] [address] [phone] 08/06/2019 8:58 AM

## 2019-08-07 LAB — NOVEL CORONAVIRUS, NAA (HOSP ORDER, SEND-OUT TO REF LAB; TAT 18-24 HRS): SARS-CoV-2, NAA: NOT DETECTED

## 2019-08-09 ENCOUNTER — Telehealth: Payer: Self-pay | Admitting: Audiology

## 2019-08-09 NOTE — Progress Notes (Signed)
Pre-op call complete. Patient states he has remained in quarantine since Covid test. He states he has not missed any doses of his blood thinner and will take it morning of procedure. He confirms he has a responsible party taking him home. Patient to arrive by 9 am 08/10/19. All questions addressed.

## 2019-08-10 ENCOUNTER — Encounter (HOSPITAL_COMMUNITY)
Admission: RE | Disposition: A | Discharge status: Home / Self Care | Payer: Self-pay | Source: Home / Self Care | Attending: Cardiovascular Disease

## 2019-08-10 ENCOUNTER — Ambulatory Visit (HOSPITAL_COMMUNITY): Payer: Medicare Other | Admitting: Anesthesiology

## 2019-08-10 ENCOUNTER — Encounter (HOSPITAL_COMMUNITY): Payer: Self-pay | Admitting: Cardiovascular Disease

## 2019-08-10 ENCOUNTER — Ambulatory Visit (HOSPITAL_COMMUNITY)
Admission: RE | Admit: 2019-08-10 | Discharge: 2019-08-10 | Disposition: A | Discharge status: Home / Self Care | Payer: Medicare Other | Attending: Cardiovascular Disease | Admitting: Cardiovascular Disease

## 2019-08-10 ENCOUNTER — Other Ambulatory Visit: Payer: Self-pay

## 2019-08-10 DIAGNOSIS — F329 Major depressive disorder, single episode, unspecified: Secondary | ICD-10-CM | POA: Diagnosis not present

## 2019-08-10 DIAGNOSIS — Z951 Presence of aortocoronary bypass graft: Secondary | ICD-10-CM | POA: Diagnosis not present

## 2019-08-10 DIAGNOSIS — Z79899 Other long term (current) drug therapy: Secondary | ICD-10-CM | POA: Diagnosis not present

## 2019-08-10 DIAGNOSIS — G4733 Obstructive sleep apnea (adult) (pediatric): Secondary | ICD-10-CM | POA: Diagnosis not present

## 2019-08-10 DIAGNOSIS — I10 Essential (primary) hypertension: Secondary | ICD-10-CM | POA: Insufficient documentation

## 2019-08-10 DIAGNOSIS — Z7901 Long term (current) use of anticoagulants: Secondary | ICD-10-CM | POA: Insufficient documentation

## 2019-08-10 DIAGNOSIS — I2510 Atherosclerotic heart disease of native coronary artery without angina pectoris: Secondary | ICD-10-CM | POA: Diagnosis not present

## 2019-08-10 DIAGNOSIS — I483 Typical atrial flutter: Secondary | ICD-10-CM | POA: Diagnosis not present

## 2019-08-10 DIAGNOSIS — Z87891 Personal history of nicotine dependence: Secondary | ICD-10-CM | POA: Diagnosis not present

## 2019-08-10 DIAGNOSIS — I252 Old myocardial infarction: Secondary | ICD-10-CM | POA: Diagnosis not present

## 2019-08-10 DIAGNOSIS — E785 Hyperlipidemia, unspecified: Secondary | ICD-10-CM | POA: Insufficient documentation

## 2019-08-10 DIAGNOSIS — N400 Enlarged prostate without lower urinary tract symptoms: Secondary | ICD-10-CM | POA: Insufficient documentation

## 2019-08-10 HISTORY — PX: CARDIOVERSION: SHX1299

## 2019-08-10 SURGERY — CARDIOVERSION
Anesthesia: General

## 2019-08-10 MED ORDER — LIDOCAINE 2% (20 MG/ML) 5 ML SYRINGE
INTRAMUSCULAR | Status: DC | PRN
  Administered 2019-08-10: 60 mg via INTRAVENOUS

## 2019-08-10 MED ORDER — PROPOFOL 10 MG/ML IV BOLUS
INTRAVENOUS | Status: DC | PRN
  Administered 2019-08-10: 60 mg via INTRAVENOUS

## 2019-08-10 NOTE — Transfer of Care (Signed)
Immediate Anesthesia Transfer of Care Note  Patient: [name]  Procedure(s) Performed: CARDIOVERSION (N/A )  Patient Location: Endoscopy Unit  Anesthesia Type:General  Level of Consciousness: awake  Airway & Oxygen Therapy: Patient Spontanous Breathing  Post-op Assessment: Report given to RN and Post -op Vital signs reviewed and stable  Post vital signs: Reviewed and stable  Last Vitals:  Vitals Value Taken Time  BP    Temp    Pulse    Resp    SpO2      Last Pain:  Vitals:   08/10/19 0920  PainSc: 0-No pain         Complications: No apparent anesthesia complications

## 2019-08-10 NOTE — Interval H&P Note (Signed)
History and Physical Interval Note:  08/10/2019 10:03 AM  [name]  has presented today for surgery, with the diagnosis of afib.  The various methods of treatment have been discussed with the patient and family. After consideration of risks, benefits and other options for treatment, the patient has consented to  Procedure(s): CARDIOVERSION (N/A) as a surgical intervention.  The patient's history has been reviewed, patient examined, no change in status, stable for surgery.  I have reviewed the patient's chart and labs.  Questions were answered to the patient's satisfaction.     [name]

## 2019-08-10 NOTE — Anesthesia Preprocedure Evaluation (Addendum)
Anesthesia Evaluation  Patient identified by MRN, date of birth, ID band Patient awake    Reviewed: Allergy & Precautions, NPO status , Patient's Chart, lab work & pertinent test results, reviewed documented beta blocker date and time   Airway Mallampati: II  TM Distance: >3 FB Neck ROM: Full    Dental  (+) Teeth Intact, Dental Advisory Given, Implants   Pulmonary sleep apnea and Continuous Positive Airway Pressure Ventilation , former smoker,    Pulmonary exam normal breath sounds clear to auscultation       Cardiovascular hypertension, Pt. on medications and Pt. on home beta blockers (-) angina+ CAD, + Past MI, + CABG and + Peripheral Vascular Disease  + dysrhythmias Atrial Fibrillation + Valvular Problems/Murmurs AI  Rhythm:Irregular Rate:Abnormal + Diastolic murmurs    Neuro/Psych  Headaches, PSYCHIATRIC DISORDERS Anxiety Depression    GI/Hepatic negative GI ROS, Neg liver ROS,   Endo/Other  negative endocrine ROS  Renal/GU negative Renal ROS     Musculoskeletal  (+) Arthritis ,   Abdominal   Peds  Hematology  (+) Blood dyscrasia (Eliquis), ,   Anesthesia Other Findings Day of surgery medications reviewed with the patient.  Reproductive/Obstetrics                            Anesthesia Physical Anesthesia Plan  ASA: III  Anesthesia Plan: General   Post-op Pain Management:    Induction: Intravenous  PONV Risk Score and Plan: 2 and Treatment may vary due to age or medical condition  Airway Management Planned: Mask  Additional Equipment:   Intra-op Plan:   Post-operative Plan:   Informed Consent: I have reviewed the patients History and Physical, chart, labs and discussed the procedure including the risks, benefits and alternatives for the proposed anesthesia with the patient or authorized representative who has indicated his/her understanding and acceptance.     Dental  advisory given  Plan Discussed with: CRNA  Anesthesia Plan Comments:        Anesthesia Quick Evaluation

## 2019-08-10 NOTE — Anesthesia Postprocedure Evaluation (Signed)
Anesthesia Post Note  Patient: [name]  Procedure(s) Performed: CARDIOVERSION (N/A )     Patient location during evaluation: PACU Anesthesia Type: General Level of consciousness: awake and alert Pain management: pain level controlled Vital Signs Assessment: post-procedure vital signs reviewed and stable Respiratory status: spontaneous breathing, nonlabored ventilation and respiratory function stable Cardiovascular status: blood pressure returned to baseline and stable Postop Assessment: no apparent nausea or vomiting Anesthetic complications: no    Last Vitals:  Vitals:   08/10/19 1036 08/10/19 1043  BP: (!) 109/56 (!) 109/57  Pulse: (!) 30 (!) 29  Resp: 17 20  Temp:    SpO2: 98% 100%    Last Pain:  Vitals:   08/10/19 1043  TempSrc:   PainSc: 0-No pain                 Catalina Gravel

## 2019-08-10 NOTE — CV Procedure (Signed)
Procedure: Electrical Cardioversion Indications:  Atrial Flutter  Procedure Details:  Consent: Risks of procedure as well as the alternatives and risks of each were explained to the (patient/caregiver).  Consent for procedure obtained.  Time Out: Verified patient identification, verified procedure, site/side was marked, verified correct patient position, special equipment/implants available, medications/allergies/relevent history reviewed, required imaging and test results available. PERFORMED.  Patient placed on cardiac monitor, pulse oximetry, supplemental oxygen as necessary.  Sedation given: propofol per anesthesia Pacer pads placed anterior and posterior chest.  Cardioverted 1 time(s).  Cardioversion with synchronized biphasic 120J shock.  Evaluation: Findings: Post procedure EKG shows: NSR with PVCs in bigeminy Complications: None Patient did tolerate procedure well.  Time Spent Directly with the Patient:  30 minutes   Elouise Munroe 08/10/2019, 10:11 AM

## 2019-08-13 ENCOUNTER — Telehealth: Payer: Self-pay | Admitting: Cardiology

## 2019-08-13 NOTE — Telephone Encounter (Signed)
Pt advised of office policy during Covid. Advised pt that if he couldn't hear the day of his consult, then we should reschedule to a day that he can. Pt reports that he has an old set of hearing aids that he could use so that he can hear for the visit. Advised that he can have his girlfriend on speakerphone/facetime to take part in the visit next week. Patient verbalized understanding and agreeable to plan.

## 2019-08-13 NOTE — Telephone Encounter (Signed)
Patient wanted to know if his Girlfriend would be able to be with him during hie consultation with Dr. Curt Bears on Tuesday. Most times he is independent and comes to his appointments alone, but he is scheduled to have an appointment with his Audiologist on Monday 01-25, and may be without both hearing aids for his visit 01-26.   Please call him and let him know what the office decides. If he does not pick up the phone, please leave a detailed message on his voicmail

## 2019-08-17 ENCOUNTER — Encounter: Payer: Self-pay | Admitting: Cardiology

## 2019-08-17 ENCOUNTER — Ambulatory Visit (INDEPENDENT_AMBULATORY_CARE_PROVIDER_SITE_OTHER): Payer: Medicare Other | Admitting: Cardiology

## 2019-08-17 ENCOUNTER — Ambulatory Visit (HOSPITAL_COMMUNITY): Payer: 59 | Admitting: Physician Assistant

## 2019-08-17 ENCOUNTER — Other Ambulatory Visit: Payer: Self-pay

## 2019-08-17 VITALS — BP 128/56 | HR 67 | Ht 67.0 in | Wt 154.6 lb

## 2019-08-17 DIAGNOSIS — Z01812 Encounter for preprocedural laboratory examination: Secondary | ICD-10-CM

## 2019-08-17 DIAGNOSIS — I483 Typical atrial flutter: Secondary | ICD-10-CM

## 2019-08-17 NOTE — Progress Notes (Signed)
Electrophysiology Office Note   Date:  08/17/2019   ID:  [name], DOB 12-02-1945, MRN [id number]  PCP:  Burman Freestone, MD  Cardiologist: Radford Pax Primary Electrophysiologist:  Jonai Weyland Meredith Leeds, MD    Chief Complaint: Atrial flutter   History of Present Illness: [name] is a 74 y.o. male who is being seen today for the evaluation of atrial flutter at the request of Burman Freestone, MD. Presenting today for electrophysiology evaluation.  He has a history significant for coronary disease status post CABG 1998, bicuspid aortic valve with moderate to severe AI and dilated aortic root, hyperlipidemia, hypertension, OSA, and atrial flutter. He was having increased palpitations December 2020. A 30-day monitor was placed that showed episodes of atrial flutter with heart rates in the 120s. He had symptoms of palpitations for approximately 1 hour that corresponded to the event. He denied any identifiable triggers.  He had cardioversion recently.  Since his cardioversion, he has had less weakness and fatigue as well as less palpitations.  Today, he denies symptoms of palpitations, chest pain, shortness of breath, orthopnea, PND, lower extremity edema, claudication, dizziness, presyncope, syncope, bleeding, or neurologic sequela. The patient is tolerating medications without difficulties.    Past Medical History:  Diagnosis Date  . Anal fissure   . Arthritis   . Bicuspid aortic valve    with mild to moderate AR by echo 10/2017  . BPH (benign prostatic hyperplasia)   . CAD (coronary artery disease)    s/p CABG [place]  . Cataract    bilateral-removed  . Depression   . Dilated aortic root (HCC)    78mm aortic root and 67mm ascending aorta by echo 10/2017  . History of blood transfusion 1998   "due to cardiac bypass surgery"  . Hyperlipidemia   . Hypertension   . Hypogonadism in male   . Kidney stones   . Migraine   . Myocardial infarction ([place])   . Retinal  hemorrhage 11/04/2014  . Sleep apnea with use of continuous positive airway pressure (CPAP)    Past Surgical History:  Procedure Laterality Date  . CARDIOVERSION N/A 08/10/2019   Procedure: CARDIOVERSION;  Surgeon: Elouise Munroe, MD;  Location[facility] ENDOSCOPY;  Service: Cardiovascular;  Laterality: N/A;  . cataracts    . COLONOSCOPY    . CORONARY ARTERY BYPASS GRAFT  1998  . LEFT HEART CATH AND CORONARY ANGIOGRAPHY N/A 08/24/2018   Procedure: LEFT HEART CATH AND CORONARY ANGIOGRAPHY;  Surgeon: Lorretta Harp, MD;  Location: [facility] CV LAB;  Service: Cardiovascular;  Laterality: N/A;  . TEE WITHOUT CARDIOVERSION N/A 01/19/2015   Procedure: TRANSESOPHAGEAL ECHOCARDIOGRAM (TEE);  Surgeon: Sueanne Margarita, MD;  Location: [facility] ENDOSCOPY;  Service: Cardiovascular;  Laterality: N/A;  . [place]   pt denies     Current Outpatient Medications  Medication Sig Dispense Refill  . ALPRAZolam (ALPRAZOLAM XR) 1 MG 24 hr tablet Take 1 tablet (1 mg total) by mouth daily. 30 tablet 5  . amiodarone (PACERONE) 200 MG tablet Take 1 tablet twice a day until follow up with Dr. Curt Bears 1/26 60 tablet 0  . amLODipine (NORVASC) 10 MG tablet TAKE ONE TABLET BY MOUTH DAILY 90 tablet 3  . amoxicillin (AMOXIL) 500 MG capsule Take 4 tablets, 2 grams, 1 hour before each of your three dental procedures. 12 capsule 0  . apixaban (ELIQUIS) 5 MG TABS tablet Take 1 tablet (5 mg total) by mouth 2 (two)  times daily. 180 tablet 3  . Ascorbic Acid (VITAMIN C) 1000 MG tablet Take 1,000 mg by mouth 2 (two) times daily.    Marland Kitchen aspirin-acetaminophen-caffeine (EXCEDRIN EXTRA STRENGTH) 250-250-65 MG tablet Take 2 tablets by mouth daily as needed for headache.     . butalbital-aspirin-caffeine-codeine (FIORINAL WITH CODEINE) 50-325-40-30 MG capsule TAKE ONE CAPSULE BY MOUTH EVERY 4 HOURS AS NEEDED FOR Migraine 30 capsule 0  . diazepam (VALIUM) 5 MG tablet Take 2.5-5 mg by mouth 2 (two) times daily as needed for  anxiety.     Marland Kitchen ibuprofen (ADVIL,MOTRIN) 200 MG tablet Take 200-400 mg by mouth 2 (two) times daily as needed (for arthritis in the feet).     Marland Kitchen losartan (COZAAR) 25 MG tablet TAKE ONE TABLET BY MOUTH DAILY 90 tablet 2  . metoprolol tartrate (LOPRESSOR) 25 MG tablet Take 1 tablet (25 mg total) by mouth every 6 (six) hours as needed (Heart Rate >100). 60 tablet 2  . niacin 500 MG tablet Take 500 mg by mouth at bedtime.    . nitroGLYCERIN (NITROSTAT) 0.4 MG SL tablet Place 1 tablet (0.4 mg total) under the tongue every 5 (five) minutes x 3 doses as needed for chest pain. 25 tablet 12  . predniSONE (DELTASONE) 5 MG tablet Take 5 mg by mouth 2 (two) times daily.     . rosuvastatin (CRESTOR) 20 MG tablet Take 20 mg by mouth every evening.     Marland Kitchen Specialty Vitamins Products (CENTRUM SPECIALIST ENERGY) TABS Take 1 tablet by mouth daily with breakfast.    . Testosterone (ANDROGEL) 40.5 MG/2.5GM (1.62%) GEL Place 1 application onto the skin daily. Apply 1 pump to each shoulder once a day    . TURMERIC PO Take 1 capsule by mouth daily with breakfast.     . vitamin E 400 UNIT capsule Take 400 Units by mouth at bedtime.     Marland Kitchen zonisamide (ZONEGRAN) 25 MG capsule TAKE TWO CAPSULES BY MOUTH DAILY 180 capsule 3   Current Facility-Administered Medications  Medication Dose Route Frequency Provider Last Rate Last Admin  . 0.9 %  sodium chloride infusion  500 mL Intravenous Once Thornton Park, MD      . 0.9 %  sodium chloride infusion  500 mL Intravenous Once Armbruster, Carlota Raspberry, MD        Allergies:   Mushroom extract complex   Social History:  The patient  reports that he quit smoking about 33 years ago. He quit after 20.00 years of use. He has never used smokeless tobacco. He reports current alcohol use of about 1.0 standard drinks of alcohol per week. He reports that he does not use drugs.   Family History:  The patient's family history includes Arthritis in his father and mother; Diabetes in his father  and mother; Heart disease (age of onset: 78) in his father; Hyperlipidemia in his father and mother; Hypertension in his father; Kidney disease in his paternal grandfather; Stroke (age of onset: 81) in his father.    ROS:  Please see the history of present illness.   Otherwise, review of systems is positive for none.   All other systems are reviewed and negative.    PHYSICAL EXAM: VS:  BP (!) 128/56   Pulse 67   Ht 5\' 7"  (1.702 m)   Wt 154 lb 9.6 oz (70.1 kg)   SpO2 98%   BMI 24.21 kg/m  , BMI Body mass index is 24.21 kg/m. GEN: Well nourished, well developed, in no  acute distress  HEENT: normal  Neck: no JVD, carotid bruits, or masses Cardiac: RRR; no murmurs, rubs, or gallops,no edema  Respiratory:  clear to auscultation bilaterally, normal work of breathing GI: soft, nontender, nondistended, + BS MS: no deformity or atrophy  Skin: warm and dry Neuro:  Strength and sensation are intact Psych: euthymic mood, full affect  EKG:  EKG is not ordered today. Personal review of the ekg ordered 08/06/2019 shows atrial flutter  Recent Labs: 08/21/2018: Magnesium 2.0 07/29/2019: ALT 65; BUN 19; Creatinine, Ser 1.36; Hemoglobin 14.4; Platelets 276; Potassium 4.2; Sodium 142; TSH 1.537    Lipid Panel     Component Value Date/Time   CHOL 76 (L) 06/22/2019 1014   TRIG 79 06/22/2019 1014   HDL 40 06/22/2019 1014   CHOLHDL 1.9 06/22/2019 1014   CHOLHDL 2.8 08/22/2018 0645   VLDL 12 08/22/2018 0645   LDLCALC 19 06/22/2019 1014     Wt Readings from Last 3 Encounters:  08/17/19 154 lb 9.6 oz (70.1 kg)  08/06/19 149 lb 6.4 oz (67.8 kg)  07/29/19 148 lb 6.4 oz (67.3 kg)      Other studies Reviewed: Additional studies/ records that were reviewed today include: TTE 08/22/18  Review of the above records today demonstrates:   1. The left ventricle has normal systolic function of 123456. The cavity size is normal. There is mild concentric left ventricular hypertrophy. Echo evidence of  normal diastolic filling patterns. Normal left ventricular filling pressures.  2. Normal left atrial size.  3. Normal right atrial size.  4. The mitral valve normal in structure. There is mild mitral annular calcification present. Regurgitation is trivial by color flow Doppler . No evidence of mitral valve stenosis.  5. Normal tricuspid valve.  6. The aortic valve possibly bicuspid. Aortic valve regurgitation is mild to moderate by color flow Doppler. The jet is eccentric posteriorly directed.  7. Moderate dilatation of the aortic root.  8. No atrial level shunt detected by color flow Doppler.  9. Consider further evaluation of the ascending aorta with CT angiography.  LHC 08/24/18   Ost LAD lesion is 100% stenosed.  Ost RCA to Prox RCA lesion is 100% stenosed.  Origin lesion is 100% stenosed.  Prox Cx lesion is 50% stenosed.  Monitor 08/04/18 personally reviewed  Sinus bradycardia, normal sinus rhythm and sinus tachycardia. The average heart rate was 67bpm. The heart rate ranged from 43 to 135bpm.  Frequent PVCs single, bigeminal and trigeminal and nonsustained ventricular tachycardia up to 4 beats.  Paroxysmal atrial flutter < 1% burden.  ASSESSMENT AND PLAN:  1. Typical atrial flutter: Currently on amiodarone and Eliquis. CHA2DS2-VASc of 3.  He would prefer to be off of these medications.  We Kendle Turbin thus plan for ablation.  Risks and benefits were discussed.  Risk include bleeding, tamponade, heart block, stroke, among others.  Understand these risks and is agreed to the procedure.  2. Coronary artery disease: No current angina.  3. Hypertension: Currently well controlled  4. OSA: CPAP compliance encouraged  Case discussed with primary cardiology  Current medicines are reviewed at length with the patient today.   The patient does not have concerns regarding his medicines.  The following changes were made today:  none  Labs/ tests ordered today include:  Orders Placed This  Encounter  Procedures  . Basic metabolic panel  . CBC     Disposition:   FU with Matthews Franks 3 months  Signed, Ginni Eichler Meredith Leeds, MD  08/17/2019 11:53  AM     [facility] [address] [place] [address] [phone] (office) [phone] (fax)

## 2019-08-17 NOTE — Patient Instructions (Addendum)
Medication Instructions:  Your physician recommends that you continue on your current medications as directed. Please refer to the Current Medication list given to you today.  Labwork: Pre procedure lab work between:  for [employer] & CBC If you have labs (blood work) drawn today and your tests are completely normal, you will receive your results only by:  [place] (if you have MyChart) OR  A paper copy in the mail If you have any lab test that is abnormal or we need to change your treatment, we will call you to review the results.  Testing/Procedures: Your physician has recommended that you have an ablation. Catheter ablation is a medical procedure used to treat some cardiac arrhythmias (irregular heartbeats). During catheter ablation, a long, thin, flexible tube is put into a blood vessel in your groin (upper thigh), or neck. This tube is called an ablation catheter. It is then guided to your heart through the blood vessel. Radio frequency waves destroy small areas of heart tissue where abnormal heartbeats may cause an arrhythmia to start.  Follow-Up: Your physician recommends that you schedule a follow-up appointment in: 4 weeks, after your procedure on 09/29/2019, with Dr. Curt Bears.  * If you need a refill on your cardiac medications before your next appointment, please call your pharmacy.    Thank you for choosing [facility]!!   Trinidad Curet, RN [phone]  Any Other Special Instructions Will Be Listed Below (If Applicable).     Electrophysiology/Ablation Procedure Instructions   You are scheduled for a(n)  ablation on 09/29/2019 with Dr. Allegra Lai.   1.   Pre procedure testing-             A.  LAB WORK --- On 08/17/19 for your pre procedure blood work.                 B. COVID TEST-- On 09/25/2019 @ 9:00 am - You will go to [facility] ([place]) for your Covid testing.   This is a drive thru test site.  There will be multiple testing  areas.  Be sure to share with the first checkpoint that you are there for pre-procedure/surgery testing. This will put you into the right (yellow) lane that leads to the PAT testing team. Stay in your car and the nurse team will come to your car to test you.  After you are tested please go home and self quarantine until the day of your procedure.     2. On the day of your procedure 09/29/2019 you will go to [facility] [phone]) at 6:30 am.  Dennis Bast will go to the main entrance A [employer]) and enter where the [employer] are.  Your driver will drop you off and you will head down the hallway to ADMITTING.  You may have one support person come in to the hospital with you.  They will be asked to wait in the waiting room.   3.   Do not eat or drink after midnight prior to your procedure.   4.   Do NOT take any medications the morning of your procedure.   5.  Plan for an overnight stay.  If you use your phone frequently bring your phone charger.   6. You will follow up with Dr. Curt Bears in 4 weeks after your procedure.  The office will call to arrange this appointment.   * If you have ANY questions please call the office [phone]  [phone] and ask for Raiana Pharris RN or send me a MyChart message   * Occasionally, EP Studies and ablations can become lengthy.  Please make your family aware of this before your procedure starts.  Average time ranges from 2-8 hours for EP studies/ablations.  Your physician will call your family after the procedure with the results.                                    Cardiac Ablation Cardiac ablation is a procedure to disable (ablate) a small amount of heart tissue in very specific places. The heart has many electrical connections. Sometimes these connections are abnormal and can cause the heart to beat very fast or irregularly. Ablating some of the problem areas can improve the heart rhythm or return it to normal. Ablation may be done for people who:  Have  Wolff-Parkinson-White syndrome.  Have fast heart rhythms (tachycardia).  Have taken medicines for an abnormal heart rhythm (arrhythmia) that were not effective or caused side effects.  Have a high-risk heartbeat that may be life-threatening.  During the procedure, a small incision is made in the neck or the groin, and a long, thin, flexible tube (catheter) is inserted into the incision and moved to the heart. Small devices (electrodes) on the tip of the catheter will send out electrical currents. A type of X-ray (fluoroscopy) will be used to help guide the catheter and to provide images of the heart. Tell a health care provider about:  Any allergies you have.  All medicines you are taking, including vitamins, herbs, eye drops, creams, and over-the-counter medicines.  Any problems you or family members have had with anesthetic medicines.  Any blood disorders you have.  Any surgeries you have had.  Any medical conditions you have, such as kidney failure.  Whether you are pregnant or may be pregnant. What are the risks? Generally, this is a safe procedure. However, problems may occur, including:  Infection.  Bruising and bleeding at the catheter insertion site.  Bleeding into the chest, especially into the sac that surrounds the heart. This is a serious complication.  Stroke or blood clots.  Damage to other structures or organs.  Allergic reaction to medicines or dyes.  Need for a permanent pacemaker if the normal electrical system is damaged. A pacemaker is a small computer that sends electrical signals to the heart and helps your heart beat normally.  The procedure not being fully effective. This may not be recognized until months later. Repeat ablation procedures are sometimes required.  What happens before the procedure?  Follow instructions from your health care provider about eating or drinking restrictions.  Ask your health care provider about: ? Changing or  stopping your regular medicines. This is especially important if you are taking diabetes medicines or blood thinners. ? Taking medicines such as aspirin and ibuprofen. These medicines can thin your blood. Do not take these medicines before your procedure if your health care provider instructs you not to.  Plan to have someone take you home from the hospital or clinic.  If you will be going home right after the procedure, plan to have someone with you for 24 hours. What happens during the procedure?  To lower your risk of infection: ? Your health care team will wash or sanitize their hands. ? Your skin will be washed with soap. ? Hair may be removed from the incision area.  An  IV tube will be inserted into one of your veins.  You will be given a medicine to help you relax (sedative).  The skin on your neck or groin will be numbed.  An incision will be made in your neck or your groin.  A needle will be inserted through the incision and into a large vein in your neck or groin.  A catheter will be inserted into the needle and moved to your heart.  Dye may be injected through the catheter to help your surgeon see the area of the heart that needs treatment.  Electrical currents will be sent from the catheter to ablate heart tissue in desired areas. There are three types of energy that may be used to ablate heart tissue: ? Heat (radiofrequency energy). ? Laser energy. ? Extreme cold (cryoablation).  When the necessary tissue has been ablated, the catheter will be removed.  Pressure will be held on the catheter insertion area to prevent excessive bleeding.  A bandage (dressing) will be placed over the catheter insertion area. The procedure may vary among health care providers and hospitals. What happens after the procedure?  Your blood pressure, heart rate, breathing rate, and blood oxygen level will be monitored until the medicines you were given have worn off.  Your catheter  insertion area will be monitored for bleeding. You will need to lie still for a few hours to ensure that you do not bleed from the catheter insertion area.  Do not drive for 5-7 days or as long as directed by your health care provider. Summary  Cardiac ablation is a procedure to disable (ablate) a small amount of heart tissue in very specific places. Ablating some of the problem areas can improve the heart rhythm or return it to normal.  During the procedure, electrical currents will be sent from the catheter to ablate heart tissue in desired areas. This information is not intended to replace advice given to you by your health care provider. Make sure you discuss any questions you have with your health care provider. Document Released: 11/24/2008 Document Revised: 05/27/2016 Document Reviewed: 05/27/2016 Elsevier Interactive Patient Education  [employer].

## 2019-08-18 LAB — BASIC METABOLIC PANEL
BUN/Creatinine Ratio: 14 (ref 10–24)
BUN: 26 mg/dL (ref 8–27)
CO2: 22 mmol/L (ref 20–29)
Calcium: 9.1 mg/dL (ref 8.6–10.2)
Chloride: 108 mmol/L — ABNORMAL HIGH (ref 96–106)
Creatinine, Ser: 1.89 mg/dL — ABNORMAL HIGH (ref 0.76–1.27)
GFR calc Af Amer: 40 mL/min/{1.73_m2} — ABNORMAL LOW (ref >59)
GFR calc non Af Amer: 35 mL/min/{1.73_m2} — ABNORMAL LOW (ref >59)
Glucose: 99 mg/dL (ref 65–99)
Potassium: 4.8 mmol/L (ref 3.5–5.2)
Sodium: 144 mmol/L (ref 134–144)

## 2019-08-18 LAB — CBC
Hematocrit: 40.7 % (ref 37.5–51.0)
Hemoglobin: 14.1 g/dL (ref 13.0–17.7)
MCH: 34.6 pg — ABNORMAL HIGH (ref 26.6–33.0)
MCHC: 34.6 g/dL (ref 31.5–35.7)
MCV: 100 fL — ABNORMAL HIGH (ref 79–97)
Platelets: 251 10*3/uL (ref 150–450)
RBC: 4.08 x10E6/uL — ABNORMAL LOW (ref 4.14–5.80)
RDW: 12 % (ref 11.6–15.4)
WBC: 9.7 10*3/uL (ref 3.4–10.8)

## 2019-08-25 ENCOUNTER — Other Ambulatory Visit (HOSPITAL_COMMUNITY): Payer: Self-pay | Admitting: Physician Assistant

## 2019-08-30 ENCOUNTER — Other Ambulatory Visit: Payer: Self-pay | Admitting: Cardiology

## 2019-08-30 ENCOUNTER — Telehealth: Payer: Self-pay | Admitting: Cardiology

## 2019-08-30 NOTE — Telephone Encounter (Signed)
Returned call to pt. Informed that his procedure is still a go for 3/10. Pt aware I will follow up in week/two if still need to address anything. Patient verbalized understanding and agreeable to plan.

## 2019-08-30 NOTE — Telephone Encounter (Signed)
Patient just wanted to make sure his ablation is still scheduled for 09-29-19. He does not see the procedure on his upcoming appointments on his MyChart. He will be having his Girlfriend drive him, and he needs to make sure she can be in town to do so. Please confirm the ablation date with him

## 2019-08-31 ENCOUNTER — Encounter: Payer: Self-pay | Admitting: Neurology

## 2019-09-01 ENCOUNTER — Encounter: Payer: Self-pay | Admitting: Adult Health

## 2019-09-01 DIAGNOSIS — Z0289 Encounter for other administrative examinations: Secondary | ICD-10-CM

## 2019-09-04 ENCOUNTER — Other Ambulatory Visit (HOSPITAL_COMMUNITY): Payer: Medicare Other

## 2019-09-07 ENCOUNTER — Telehealth: Payer: Self-pay | Admitting: *Deleted

## 2019-09-07 ENCOUNTER — Other Ambulatory Visit: Payer: Self-pay

## 2019-09-07 ENCOUNTER — Ambulatory Visit (HOSPITAL_COMMUNITY): Payer: Medicare Other | Attending: Cardiovascular Disease

## 2019-09-07 DIAGNOSIS — I4892 Unspecified atrial flutter: Secondary | ICD-10-CM | POA: Diagnosis not present

## 2019-09-07 NOTE — Telephone Encounter (Signed)
Pt aware to arrive at 9:30 am the morning of his ablation on 3/10.  NOT 6:30 am as originally advised. Patient verbalized understanding and agreeable to plan.

## 2019-09-20 ENCOUNTER — Other Ambulatory Visit: Payer: Self-pay | Admitting: Cardiology

## 2019-09-25 ENCOUNTER — Other Ambulatory Visit (HOSPITAL_COMMUNITY)
Admission: RE | Admit: 2019-09-25 | Discharge: 2019-09-25 | Disposition: A | Discharge status: Home / Self Care | Payer: Medicare Other | Source: Ambulatory Visit | Attending: Cardiology | Admitting: Cardiology

## 2019-09-25 DIAGNOSIS — Z20822 Contact with and (suspected) exposure to covid-19: Secondary | ICD-10-CM | POA: Insufficient documentation

## 2019-09-25 DIAGNOSIS — Z01812 Encounter for preprocedural laboratory examination: Secondary | ICD-10-CM | POA: Diagnosis present

## 2019-09-25 LAB — SARS CORONAVIRUS 2 (TAT 6-24 HRS): SARS Coronavirus 2: NEGATIVE

## 2019-09-28 ENCOUNTER — Encounter: Payer: Self-pay | Admitting: Adult Health

## 2019-09-28 NOTE — Progress Notes (Signed)
Instructed patient on the following items: Arrival time 0930 Nothing to eat or drink after midnight No meds AM of procedure Responsible person to drive you home and stay with you for 24 hrs  Have you missed any doses of anti-coagulant on Eliquis hasn't missed any doses, take both doses today, don't take any medication in the am

## 2019-09-29 ENCOUNTER — Ambulatory Visit (HOSPITAL_COMMUNITY): Payer: Medicare Other | Admitting: Anesthesiology

## 2019-09-29 ENCOUNTER — Other Ambulatory Visit: Payer: Self-pay

## 2019-09-29 ENCOUNTER — Encounter (HOSPITAL_COMMUNITY)
Admission: RE | Disposition: A | Discharge status: Home / Self Care | Payer: Self-pay | Source: Home / Self Care | Attending: Cardiology

## 2019-09-29 ENCOUNTER — Ambulatory Visit (HOSPITAL_COMMUNITY)
Admission: RE | Admit: 2019-09-29 | Discharge: 2019-09-29 | Disposition: A | Discharge status: Home / Self Care | Payer: Medicare Other | Attending: Cardiology | Admitting: Cardiology

## 2019-09-29 ENCOUNTER — Encounter (HOSPITAL_COMMUNITY): Payer: Self-pay | Admitting: Cardiology

## 2019-09-29 DIAGNOSIS — Z951 Presence of aortocoronary bypass graft: Secondary | ICD-10-CM | POA: Diagnosis not present

## 2019-09-29 DIAGNOSIS — Z87891 Personal history of nicotine dependence: Secondary | ICD-10-CM | POA: Diagnosis not present

## 2019-09-29 DIAGNOSIS — I10 Essential (primary) hypertension: Secondary | ICD-10-CM | POA: Diagnosis not present

## 2019-09-29 DIAGNOSIS — G4733 Obstructive sleep apnea (adult) (pediatric): Secondary | ICD-10-CM | POA: Diagnosis not present

## 2019-09-29 DIAGNOSIS — E785 Hyperlipidemia, unspecified: Secondary | ICD-10-CM | POA: Insufficient documentation

## 2019-09-29 DIAGNOSIS — I252 Old myocardial infarction: Secondary | ICD-10-CM | POA: Insufficient documentation

## 2019-09-29 DIAGNOSIS — I4892 Unspecified atrial flutter: Secondary | ICD-10-CM

## 2019-09-29 DIAGNOSIS — I483 Typical atrial flutter: Secondary | ICD-10-CM | POA: Diagnosis present

## 2019-09-29 DIAGNOSIS — Z8249 Family history of ischemic heart disease and other diseases of the circulatory system: Secondary | ICD-10-CM | POA: Diagnosis not present

## 2019-09-29 DIAGNOSIS — I2510 Atherosclerotic heart disease of native coronary artery without angina pectoris: Secondary | ICD-10-CM | POA: Insufficient documentation

## 2019-09-29 DIAGNOSIS — M1990 Unspecified osteoarthritis, unspecified site: Secondary | ICD-10-CM | POA: Insufficient documentation

## 2019-09-29 DIAGNOSIS — N400 Enlarged prostate without lower urinary tract symptoms: Secondary | ICD-10-CM | POA: Diagnosis not present

## 2019-09-29 HISTORY — PX: A-FLUTTER ABLATION: EP1230

## 2019-09-29 LAB — BASIC METABOLIC PANEL
Anion gap: 9 (ref 5–15)
BUN: 22 mg/dL (ref 8–23)
CO2: 23 mmol/L (ref 22–32)
Calcium: 8.5 mg/dL — ABNORMAL LOW (ref 8.9–10.3)
Chloride: 109 mmol/L (ref 98–111)
Creatinine, Ser: 1.53 mg/dL — ABNORMAL HIGH (ref 0.61–1.24)
GFR calc Af Amer: 52 mL/min — ABNORMAL LOW (ref >60)
GFR calc non Af Amer: 45 mL/min — ABNORMAL LOW (ref >60)
Glucose, Bld: 112 mg/dL — ABNORMAL HIGH (ref 70–99)
Potassium: 4.2 mmol/L (ref 3.5–5.1)
Sodium: 141 mmol/L (ref 135–145)

## 2019-09-29 LAB — POCT I-STAT, CHEM 8
BUN: 24 mg/dL — ABNORMAL HIGH (ref 8–23)
Calcium, Ion: 1.15 mmol/L (ref 1.15–1.40)
Chloride: 108 mmol/L (ref 98–111)
Creatinine, Ser: 1.6 mg/dL — ABNORMAL HIGH (ref 0.61–1.24)
Glucose, Bld: 105 mg/dL — ABNORMAL HIGH (ref 70–99)
HCT: 40 % (ref 39.0–52.0)
Hemoglobin: 13.6 g/dL (ref 13.0–17.0)
Potassium: 4.1 mmol/L (ref 3.5–5.1)
Sodium: 143 mmol/L (ref 135–145)
TCO2: 24 mmol/L (ref 22–32)

## 2019-09-29 LAB — CBC
HCT: 40.3 % (ref 39.0–52.0)
Hemoglobin: 13.3 g/dL (ref 13.0–17.0)
MCH: 34.6 pg — ABNORMAL HIGH (ref 26.0–34.0)
MCHC: 33 g/dL (ref 30.0–36.0)
MCV: 104.9 fL — ABNORMAL HIGH (ref 80.0–100.0)
Platelets: 229 10*3/uL (ref 150–400)
RBC: 3.84 MIL/uL — ABNORMAL LOW (ref 4.22–5.81)
RDW: 12 % (ref 11.5–15.5)
WBC: 9 10*3/uL (ref 4.0–10.5)
nRBC: 0 % (ref 0.0–0.2)

## 2019-09-29 SURGERY — A-FLUTTER ABLATION
Anesthesia: General

## 2019-09-29 MED ORDER — PROPOFOL 10 MG/ML IV BOLUS
INTRAVENOUS | Status: DC | PRN
  Administered 2019-09-29: 110 mg via INTRAVENOUS
  Administered 2019-09-29: 30 mg via INTRAVENOUS

## 2019-09-29 MED ORDER — PHENYLEPHRINE 40 MCG/ML (10ML) SYRINGE FOR IV PUSH (FOR BLOOD PRESSURE SUPPORT)
PREFILLED_SYRINGE | INTRAVENOUS | Status: DC | PRN
  Administered 2019-09-29 (×5): 80 ug via INTRAVENOUS

## 2019-09-29 MED ORDER — ROCURONIUM BROMIDE 10 MG/ML (PF) SYRINGE
PREFILLED_SYRINGE | INTRAVENOUS | Status: DC | PRN
  Administered 2019-09-29: 20 mg via INTRAVENOUS
  Administered 2019-09-29: 40 mg via INTRAVENOUS

## 2019-09-29 MED ORDER — SODIUM CHLORIDE 0.9 % IV SOLN: INTRAVENOUS | Status: DC

## 2019-09-29 MED ORDER — ACETAMINOPHEN 500 MG PO TABS
1000.0000 mg | ORAL_TABLET | Freq: Once | ORAL | Status: AC
  Administered 2019-09-29: 11:00:00 1000 mg via ORAL
  Filled 2019-09-29: qty 2

## 2019-09-29 MED ORDER — FENTANYL CITRATE (PF) 100 MCG/2ML IJ SOLN
INTRAMUSCULAR | Status: DC | PRN
  Administered 2019-09-29: 100 ug via INTRAVENOUS

## 2019-09-29 MED ORDER — CELECOXIB 200 MG PO CAPS
200.0000 mg | ORAL_CAPSULE | Freq: Once | ORAL | Status: AC
  Administered 2019-09-29: 200 mg via ORAL
  Filled 2019-09-29: qty 1

## 2019-09-29 MED ORDER — LIDOCAINE 2% (20 MG/ML) 5 ML SYRINGE
INTRAMUSCULAR | Status: DC | PRN
  Administered 2019-09-29: 60 mg via INTRAVENOUS

## 2019-09-29 MED ORDER — HEPARIN (PORCINE) IN NACL 1000-0.9 UT/500ML-% IV SOLN
INTRAVENOUS | Status: AC
  Filled 2019-09-29: qty 500

## 2019-09-29 MED ORDER — SODIUM CHLORIDE 0.9 % IV SOLN: 250.0000 mL | INTRAVENOUS | Status: DC | PRN

## 2019-09-29 MED ORDER — SUGAMMADEX SODIUM 200 MG/2ML IV SOLN
INTRAVENOUS | Status: DC | PRN
  Administered 2019-09-29: 150 mg via INTRAVENOUS

## 2019-09-29 MED ORDER — MIDAZOLAM HCL 5 MG/5ML IJ SOLN
INTRAMUSCULAR | Status: DC | PRN
  Administered 2019-09-29: 2 mg via INTRAVENOUS

## 2019-09-29 MED ORDER — DEXAMETHASONE SODIUM PHOSPHATE 10 MG/ML IJ SOLN
INTRAMUSCULAR | Status: DC | PRN
  Administered 2019-09-29: 10 mg via INTRAVENOUS

## 2019-09-29 MED ORDER — ONDANSETRON HCL 4 MG/2ML IJ SOLN
INTRAMUSCULAR | Status: DC | PRN
  Administered 2019-09-29: 4 mg via INTRAVENOUS

## 2019-09-29 MED ORDER — HEPARIN (PORCINE) IN NACL 1000-0.9 UT/500ML-% IV SOLN
INTRAVENOUS | Status: DC | PRN
  Administered 2019-09-29 (×2): 500 mL

## 2019-09-29 SURGICAL SUPPLY — 13 items
BAG SNAP BAND KOVER 36X36 (MISCELLANEOUS) ×2 IMPLANT
CATH EZ STEER NAV 8MM F-J CUR (ABLATOR) ×2 IMPLANT
CATH WEBSTER BI DIR CS D-F CRV (CATHETERS) ×2 IMPLANT
DEVICE CLOSURE PERCLS PRGLD 6F (VASCULAR PRODUCTS) IMPLANT
PACK EP LATEX FREE (CUSTOM PROCEDURE TRAY) ×2
PACK EP LF (CUSTOM PROCEDURE TRAY) ×1 IMPLANT
PAD PRO RADIOLUCENT 2001M-C (PAD) ×3 IMPLANT
PATCH CARTO3 (PAD) ×2 IMPLANT
PERCLOSE PROGLIDE 6F (VASCULAR PRODUCTS) ×6
SHEATH PINNACLE 7F 10CM (SHEATH) ×2 IMPLANT
SHEATH PINNACLE 8F 10CM (SHEATH) ×2 IMPLANT
SHEATH PROBE COVER 6X72 (BAG) ×6 IMPLANT
SHEATH SWARTZ RAMP 8.5F 60CM (SHEATH) ×2 IMPLANT

## 2019-09-29 NOTE — H&P (Addendum)
[name] has presented today for surgery, with the diagnosis of atrial flutter.  The various methods of treatment have been discussed with the patient and family. After consideration of risks, benefits and other options for treatment, the patient has consented to  Procedure(s): Catheter ablation as a surgical intervention .  Risks include but not limited to bleeding, tamponade, heart block, stroke, damage to surrounding organs, among others. The patient's history has been reviewed, patient examined, no change in status, stable for surgery.  I have reviewed the patient's chart and labs.  Questions were answered to the patient's satisfaction.    Drew Lai, MD 09/29/2019 11:03 AM     Electrophysiology Office Note   Date:  09/29/2019   ID:  [name], DOB Mar 05, 1946, MRN [id number]  PCP:  Burman Freestone, MD  Cardiologist: [name] Primary Electrophysiologist:  Drew Agnes Meredith Leeds, MD    Chief Complaint: Atrial flutter   History of Present Illness: [name] is a 74 y.o. male who is being seen today for the evaluation of atrial flutter at the request of No ref. provider found. Presenting today for electrophysiology evaluation.  He has a history significant for coronary disease status post CABG 1998, bicuspid aortic valve with moderate to severe AI and dilated aortic root, hyperlipidemia, hypertension, OSA, and atrial flutter. He was having increased palpitations December 2020. A 30-day monitor was placed that showed episodes of atrial flutter with heart rates in the 120s. He had symptoms of palpitations for approximately 1 hour that corresponded to the event. He denied any identifiable triggers.  He had cardioversion recently.  Since his cardioversion, he has had less weakness and fatigue as well as less palpitations.  Today, denies symptoms of palpitations, chest pain, shortness of breath, orthopnea, PND, lower extremity edema, claudication, dizziness, presyncope, syncope,  bleeding, or neurologic sequela. The patient is tolerating medications without difficulties.     Past Medical History:  Diagnosis Date  . Anal fissure   . Arthritis   . Bicuspid aortic valve    with mild to moderate AR by echo 10/2017  . BPH (benign prostatic hyperplasia)   . CAD (coronary artery disease)    s/p CABG [place]  . Cataract    bilateral-removed  . Depression   . Dilated aortic root (HCC)    93mm aortic root and 35mm ascending aorta by echo 10/2017  . History of blood transfusion 1998   "due to cardiac bypass surgery"  . Hyperlipidemia   . Hypertension   . Hypogonadism in male   . Kidney stones   . Migraine   . Myocardial infarction ([place])   . Retinal hemorrhage 11/04/2014  . Sleep apnea with use of continuous positive airway pressure (CPAP)    Past Surgical History:  Procedure Laterality Date  . CARDIOVERSION N/A 08/10/2019   Procedure: CARDIOVERSION;  Surgeon: Elouise Munroe, MD;  Location: [facility] ENDOSCOPY;  Service: Cardiovascular;  Laterality: N/A;  . cataracts    . COLONOSCOPY    . CORONARY ARTERY BYPASS GRAFT  1998  . LEFT HEART CATH AND CORONARY ANGIOGRAPHY N/A 08/24/2018   Procedure: LEFT HEART CATH AND CORONARY ANGIOGRAPHY;  Surgeon: Lorretta Harp, MD;  Location: [facility] CV LAB;  Service: Cardiovascular;  Laterality: N/A;  . TEE WITHOUT CARDIOVERSION N/A 01/19/2015   Procedure: TRANSESOPHAGEAL ECHOCARDIOGRAM (TEE);  Surgeon: Sueanne Margarita, MD;  Location: [facility];  Service: Cardiovascular;  Laterality: N/A;  . [place]   pt denies  Current Facility-Administered Medications  Medication Dose Route Frequency Provider Last Rate Last Admin  . 0.9 %  sodium chloride infusion   Intravenous Continuous Constance Haw, MD 50 mL/hr at 09/29/19 1039 New Bag at 09/29/19 1039    Allergies:   Dog epithelium allergy skin test and Mushroom extract complex   Social History:  The patient  reports that he quit smoking  about 33 years ago. He quit after 20.00 years of use. He has never used smokeless tobacco. He reports current alcohol use of about 1.0 standard drinks of alcohol per week. He reports that he does not use drugs.   Family History:  The patient's family history includes Arthritis in his father and mother; Diabetes in his father and mother; Heart disease (age of onset: 47) in his father; Hyperlipidemia in his father and mother; Hypertension in his father; Kidney disease in his paternal grandfather; Stroke (age of onset: 58) in his father.    ROS:  Please see the history of present illness.   Otherwise, review of systems is positive for none.   All other systems are reviewed and negative.    PHYSICAL EXAM: VS:  BP (!) 143/67   Pulse 72   Temp 98.3 F (36.8 C) (Skin)   Resp 17   Ht 5\' 7"  (1.702 m)   Wt 68.9 kg   SpO2 100%   BMI 23.81 kg/m  , BMI Body mass index is 23.81 kg/m. GEN: Well nourished, well developed, in no acute distress  HEENT: normal  Neck: no JVD, carotid bruits, or masses Cardiac: RRR; no murmurs, rubs, or gallops,no edema  Respiratory:  clear to auscultation bilaterally, normal work of breathing GI: soft, nontender, nondistended, + BS MS: no deformity or atrophy  Skin: warm and dry, Neuro:  Strength and sensation are intact Psych: euthymic mood, full affect  EKG:  EKG is not ordered today. Personal review of the ekg ordered 08/06/2019 shows atrial flutter  Recent Labs: 07/29/2019: ALT 65; TSH 1.537 08/17/2019: BUN 26; Creatinine, Ser 1.89; Hemoglobin 14.1; Platelets 251; Potassium 4.8; Sodium 144    Lipid Panel     Component Value Date/Time   CHOL 76 (L) 06/22/2019 1014   TRIG 79 06/22/2019 1014   HDL 40 06/22/2019 1014   CHOLHDL 1.9 06/22/2019 1014   CHOLHDL 2.8 08/22/2018 0645   VLDL 12 08/22/2018 0645   LDLCALC 19 06/22/2019 1014     Wt Readings from Last 3 Encounters:  09/29/19 68.9 kg  08/17/19 70.1 kg  08/06/19 67.8 kg      Other studies  Reviewed: Additional studies/ records that were reviewed today include: TTE 08/22/18  Review of the above records today demonstrates:   1. The left ventricle has normal systolic function of 123456. The cavity size is normal. There is mild concentric left ventricular hypertrophy. Echo evidence of normal diastolic filling patterns. Normal left ventricular filling pressures.  2. Normal left atrial size.  3. Normal right atrial size.  4. The mitral valve normal in structure. There is mild mitral annular calcification present. Regurgitation is trivial by color flow Doppler . No evidence of mitral valve stenosis.  5. Normal tricuspid valve.  6. The aortic valve possibly bicuspid. Aortic valve regurgitation is mild to moderate by color flow Doppler. The jet is eccentric posteriorly directed.  7. Moderate dilatation of the aortic root.  8. No atrial level shunt detected by color flow Doppler.  9. Consider further evaluation of the ascending aorta with CT angiography.  [place] 08/24/18  Ost LAD lesion is 100% stenosed.  Ost RCA to Prox RCA lesion is 100% stenosed.  Origin lesion is 100% stenosed.  Prox Cx lesion is 50% stenosed.  Monitor 08/04/18 personally reviewed  Sinus bradycardia, normal sinus rhythm and sinus tachycardia. The average heart rate was 67bpm. The heart rate ranged from 43 to 135bpm.  Frequent PVCs single, bigeminal and trigeminal and nonsustained ventricular tachycardia up to 4 beats.  Paroxysmal atrial flutter < 1% burden.  ASSESSMENT AND PLAN:  1. Typical atrial flutter: Plan for ablation today.  2. Coronary artery disease: No current angina.  3. Hypertension: Currently well controlled  4. OSA: CPAP compliance encouraged  Case discussed with primary cardiology  Current medicines are reviewed at length with the patient today.   The patient does not have concerns regarding his medicines.  The following changes were made today:  none  Labs/ tests ordered today include:   Orders Placed This Encounter  Procedures  . CBC  . Basic metabolic panel  . Diet NPO time specified Except for: Sips with Meds  . Informed Consent Details: Physician/Practitioner Attestation; Transcribe to consent form and obtain patient signature  . Initiate Pre-op Protocol  . Void on call to EP Lab  . Confirm CBC and BMP (or CMP) results within 7 days for inpatient and 30 days for outpatient:  . Clip right and left femoral area PM before surgery  . Clip right internal jugular area PM before surgery  . Pre-admission testing diagnosis  . EP STUDY  . Insert peripheral IV     Disposition:   FU with Kaylan Friedmann 3 months  Signed, Javonte Elenes Meredith Leeds, MD  09/29/2019 11:04 AM     [facility] [address] [place] [address] [phone] (office) [phone] (fax)

## 2019-09-29 NOTE — Anesthesia Postprocedure Evaluation (Signed)
Anesthesia Post Note  Patient: [name]  Procedure(s) Performed: A-FLUTTER ABLATION (N/A )     Patient location during evaluation: Cath Lab Anesthesia Type: General Level of consciousness: awake and alert Pain management: pain level controlled Vital Signs Assessment: post-procedure vital signs reviewed and stable Respiratory status: spontaneous breathing, nonlabored ventilation and respiratory function stable Cardiovascular status: blood pressure returned to baseline and stable Postop Assessment: no apparent nausea or vomiting Anesthetic complications: no    Last Vitals:  Vitals:   09/29/19 1341 09/29/19 1419  BP: (!) 142/64 (!) 147/59  Pulse: 67 63  Resp: 18 14  Temp: 36.8 C 36.8 C  SpO2:      Last Pain:  Vitals:   09/29/19 1419  TempSrc: Temporal  PainSc: 0-No pain                 Verina Galeno,W. EDMOND

## 2019-09-29 NOTE — Discharge Instructions (Signed)
Cardiac Ablation, Care After This sheet gives you information about how to care for yourself after your procedure. Your health care provider may also give you more specific instructions. If you have problems or questions, contact your health care provider. What can I expect after the procedure? After the procedure, it is common to have:  Bruising around your puncture site.  Tenderness around your puncture site.  Skipped heartbeats.  Tiredness (fatigue). Follow these instructions at home: Puncture site care   Follow instructions from your health care provider about how to take care of your puncture site. Make sure you: ? Wash your hands with soap and water before you change your bandage (dressing). If soap and water are not available, use hand sanitizer. ? Change your dressing as told by your health care provider. ? Leave stitches (sutures), skin glue, or adhesive strips in place. These skin closures may need to stay in place for up to 2 weeks. If adhesive strip edges start to loosen and curl up, you may trim the loose edges. Do not remove adhesive strips completely unless your health care provider tells you to do that.  Check your puncture site every day for signs of infection. Check for: ? Redness, swelling, or pain. ? Fluid or blood. If your puncture site starts to bleed, lie down on your back, apply firm pressure to the area, and contact your health care provider. ? Warmth. ? Pus or a bad smell. Driving  Ask your health care provider when it is safe for you to drive again after the procedure.  Do not drive or use heavy machinery while taking prescription pain medicine.  Do not drive for 24 hours if you were given a medicine to help you relax (sedative) during your procedure. Activity  Avoid activities that take a lot of effort for at least 3 days after your procedure.  Do not lift anything that is heavier than 10 lb (4.5 kg), or the limit that you are told, until your health  care provider says that it is safe.  Return to your normal activities as told by your health care provider. Ask your health care provider what activities are safe for you. General instructions  Take over-the-counter and prescription medicines only as told by your health care provider.  Do not use any products that contain nicotine or tobacco, such as cigarettes and e-cigarettes. If you need help quitting, ask your health care provider.  Do not take baths, swim, or use a hot tub until your health care provider approves.  Do not drink alcohol for 24 hours after your procedure.  Keep all follow-up visits as told by your health care provider. This is important. Contact a health care provider if:  You have redness, mild swelling, or pain around your puncture site.  You have fluid or blood coming from your puncture site that stops after applying firm pressure to the area.  Your puncture site feels warm to the touch.  You have pus or a bad smell coming from your puncture site.  You have a fever.  You have chest pain or discomfort that spreads to your neck, jaw, or arm.  You are sweating a lot.  You feel nauseous.  You have a fast or irregular heartbeat.  You have shortness of breath.  You are dizzy or light-headed and feel the need to lie down.  You have pain or numbness in the arm or leg closest to your puncture site. Get help right away if:  Your puncture   site suddenly swells.  Your puncture site is bleeding and the bleeding does not stop after applying firm pressure to the area. These symptoms may represent a serious problem that is an emergency. Do not wait to see if the symptoms will go away. Get medical help right away. Call your local emergency services (911 in the [place] Do not drive yourself to the hospital. Summary  After the procedure, it is normal to have bruising and tenderness at the puncture site in your groin, neck, or forearm.  Check your puncture site every  day for signs of infection.  Get help right away if your puncture site is bleeding and the bleeding does not stop after applying firm pressure to the area. This is a medical emergency. This information is not intended to replace advice given to you by your health care provider. Make sure you discuss any questions you have with your health care provider. Document Revised: 06/20/2017 Document Reviewed: 10/17/2016 Elsevier Patient Education  2020 [employer] Inc[employer]  

## 2019-09-29 NOTE — Anesthesia Preprocedure Evaluation (Addendum)
Anesthesia Evaluation  Patient identified by MRN, date of birth, ID band Patient awake    Reviewed: Allergy & Precautions, H&P , NPO status , Patient's Chart, lab work & pertinent test results  Airway Mallampati: II  TM Distance: >3 FB Neck ROM: Full    Dental no notable dental hx. (+) Teeth Intact, Dental Advisory Given, Missing, Partial Upper, Caps   Pulmonary sleep apnea and Continuous Positive Airway Pressure Ventilation , former smoker,    Pulmonary exam normal breath sounds clear to auscultation       Cardiovascular hypertension, Pt. on medications and Pt. on home beta blockers + CAD and + Past MI  + dysrhythmias Atrial Fibrillation  Rhythm:Regular Rate:Normal     Neuro/Psych  Headaches, Anxiety Depression    GI/Hepatic negative GI ROS, Neg liver ROS,   Endo/Other  negative endocrine ROS  Renal/GU Renal disease  negative genitourinary   Musculoskeletal  (+) Arthritis , Osteoarthritis,    Abdominal   Peds  Hematology negative hematology ROS (+)   Anesthesia Other Findings   Reproductive/Obstetrics                           Anesthesia Physical Anesthesia Plan  ASA: III  Anesthesia Plan: General   Post-op Pain Management:    Induction: Intravenous  PONV Risk Score and Plan: 3 and Ondansetron, Dexamethasone and Midazolam  Airway Management Planned: Oral ETT  Additional Equipment:   Intra-op Plan:   Post-operative Plan: Extubation in OR  Informed Consent: I have reviewed the patients History and Physical, chart, labs and discussed the procedure including the risks, benefits and alternatives for the proposed anesthesia with the patient or authorized representative who has indicated his/her understanding and acceptance.     Dental advisory given  Plan Discussed with: CRNA  Anesthesia Plan Comments:         Anesthesia Quick Evaluation

## 2019-09-29 NOTE — Anesthesia Procedure Notes (Signed)
Procedure Name: Intubation Date/Time: 09/29/2019 12:05 PM Performed by: Jenne Campus, CRNA Pre-anesthesia Checklist: Patient identified, Emergency Drugs available, Suction available and Patient being monitored Patient Re-evaluated:Patient Re-evaluated prior to induction Oxygen Delivery Method: Circle System Utilized Preoxygenation: Pre-oxygenation with 100% oxygen Induction Type: IV induction Ventilation: Mask ventilation without difficulty and Oral airway inserted - appropriate to patient size Laryngoscope Size: Sabra Heck and 2 Grade View: Grade II Tube type: Oral Tube size: 7.5 mm Number of attempts: 1 Airway Equipment and Method: Stylet and Oral airway Placement Confirmation: ETT inserted through vocal cords under direct vision,  positive ETCO2 and breath sounds checked- equal and bilateral Secured at: 22 cm Tube secured with: Tape Dental Injury: Teeth and Oropharynx as per pre-operative assessment

## 2019-09-29 NOTE — Transfer of Care (Signed)
Immediate Anesthesia Transfer of Care Note  Patient: [name]  Procedure(s) Performed: A-FLUTTER ABLATION (N/A )  Patient Location: PACU  Anesthesia Type:General  Level of Consciousness: oriented, drowsy and patient cooperative  Airway & Oxygen Therapy: Patient Spontanous Breathing and Patient connected to nasal cannula oxygen  Post-op Assessment: Report given to RN and Post -op Vital signs reviewed and stable  Post vital signs: Reviewed  Last Vitals:  Vitals Value Taken Time  BP 129/66 09/29/19 1341  Temp    Pulse 177 09/29/19 1343  Resp 18 09/29/19 1343  SpO2 82 % 09/29/19 1343  Vitals shown include unvalidated device data.  Last Pain:  Vitals:   09/29/19 1010  TempSrc:   PainSc: 0-No pain         Complications: No apparent anesthesia complications

## 2019-10-04 NOTE — Telephone Encounter (Signed)
Pt reports bruising, about tennis ball size, has not worsened in several days. Explained this is normal and continue monitoring.  He will call back if bruising begins to spread more. He has not noticed a "lump" in his groin area but mentions it as sensitive.  Advised if he feels a lump to not be alarmed and monitor as long as it was not bigger than peanut/grape size. Patient verbalized understanding and agreeable to plan.

## 2019-10-18 ENCOUNTER — Other Ambulatory Visit: Payer: Self-pay | Admitting: Neurology

## 2019-10-18 DIAGNOSIS — F5104 Psychophysiologic insomnia: Secondary | ICD-10-CM

## 2019-11-01 ENCOUNTER — Ambulatory Visit (INDEPENDENT_AMBULATORY_CARE_PROVIDER_SITE_OTHER): Payer: Medicare Other | Admitting: Cardiology

## 2019-11-01 ENCOUNTER — Other Ambulatory Visit: Payer: Self-pay

## 2019-11-01 ENCOUNTER — Encounter: Payer: Self-pay | Admitting: Adult Health

## 2019-11-01 ENCOUNTER — Encounter: Payer: Self-pay | Admitting: Cardiology

## 2019-11-01 VITALS — BP 138/78 | HR 78 | Ht 67.0 in | Wt 149.4 lb

## 2019-11-01 DIAGNOSIS — I483 Typical atrial flutter: Secondary | ICD-10-CM

## 2019-11-01 MED ORDER — ASPIRIN EC 81 MG PO TBEC: 81.0000 mg | DELAYED_RELEASE_TABLET | Freq: Every day | ORAL | 3 refills | Status: DC

## 2019-11-01 NOTE — Patient Instructions (Signed)
Your physician has recommended you make the following change in your medication:  1. STOP Amiodarone 2. STOP Eliquis 3. RESTART Aspirin 81 mg once daily  *If you need a refill on your cardiac medications before your next appointment, please call your pharmacy*   Lab Work: None ordered If you have labs (blood work) drawn today and your tests are completely normal, you will receive your results only by: Marland Kitchen MyChart Message (if you have MyChart) OR . A paper copy in the mail If you have any lab test that is abnormal or we need to change your treatment, we will call you to review the results.   Testing/Procedures: None ordered   Follow-Up: At [facility], you and your health needs are our priority.  As part of our continuing mission to provide you with exceptional heart care, we have created designated Provider Care Teams.  These Care Teams include your primary Cardiologist (physician) and Advanced Practice Providers (APPs -  Physician Assistants and Nurse Practitioners) who all work together to provide you with the care you need, when you need it.  We recommend signing up for the patient portal called "MyChart".  Sign up information is provided on this After Visit Summary.  MyChart is used to connect with patients for Virtual Visits (Telemedicine).  Patients are able to view lab/test results, encounter notes, upcoming appointments, etc.  Non-urgent messages can be sent to your provider as well.   To learn more about what you can do with MyChart, go to [email or web address].    Your next appointment:   As needed  The format for your next appointment:   In Person  Provider:   Allegra Lai, MD   Thank you for choosing [facility]!!   Trinidad Curet, RN [phone]    Other Instructions

## 2019-11-01 NOTE — Progress Notes (Signed)
Electrophysiology Office Note   Date:  11/01/2019   ID:  [name], DOB 1946-03-10, MRN [id number]  PCP:  Drew Freestone, MD  Cardiologist: [name] Primary Electrophysiologist:  Drew Battie Meredith Leeds, MD    Chief Complaint: Atrial flutter   History of Present Illness: [name] is a 74 y.o. male who is being seen today for the evaluation of atrial flutter at the request of Drew Freestone, MD. Presenting today for electrophysiology evaluation.  He has a history significant for coronary disease status post CABG 1998, bicuspid aortic valve with moderate to severe AI and dilated aortic root, hyperlipidemia, hypertension, OSA, and atrial flutter. He was having increased palpitations December 2020. A 30-day monitor was placed that showed episodes of atrial flutter with heart rates in the 120s. He had symptoms of palpitations for approximately 1 hour that corresponded to the event. He denied any identifiable triggers.  He had cardioversion recently.  Since his cardioversion, he has had less weakness and fatigue as well as less palpitations.  He is now status post atrial flutter ablation 09/29/2019.  Today, denies symptoms of palpitations, chest pain, shortness of breath, orthopnea, PND, lower extremity edema, claudication, dizziness, presyncope, syncope, bleeding, or neurologic sequela. The patient is tolerating medications without difficulties.  Since his ablation he has done well.  He has had no further episodes of atrial fibrillation.  He is able to do all of his daily activities.  He is exercising quite vigorously walking well over 10,000 steps a day.  He has not had any chest pain due to that.  He feels much more energy since ablation.   Past Medical History:  Diagnosis Date  . Anal fissure   . Arthritis   . Bicuspid aortic valve    with mild to moderate AR by echo 10/2017  . BPH (benign prostatic hyperplasia)   . CAD (coronary artery disease)    s/p CABG [facility]    . Cataract    bilateral-removed  . Depression   . Dilated aortic root (HCC)    57mm aortic root and 7mm ascending aorta by echo 10/2017  . History of blood transfusion 1998   "due to cardiac bypass surgery"  . Hyperlipidemia   . Hypertension   . Hypogonadism in male   . Kidney stones   . Migraine   . Myocardial infarction ([place])   . Retinal hemorrhage 11/04/2014  . Sleep apnea with use of continuous positive airway pressure (CPAP)    Past Surgical History:  Procedure Laterality Date  . A-FLUTTER ABLATION N/A 09/29/2019   Procedure: A-FLUTTER ABLATION;  Surgeon: Constance Haw, MD;  Location: [facility];  Service: Cardiovascular;  Laterality: N/A;  . CARDIOVERSION N/A 08/10/2019   Procedure: CARDIOVERSION;  Surgeon: Elouise Munroe, MD;  Location: [facility] ENDOSCOPY;  Service: Cardiovascular;  Laterality: N/A;  . cataracts    . COLONOSCOPY    . CORONARY ARTERY BYPASS GRAFT  1998  . LEFT HEART CATH AND CORONARY ANGIOGRAPHY N/A 08/24/2018   Procedure: LEFT HEART CATH AND CORONARY ANGIOGRAPHY;  Surgeon: Lorretta Harp, MD;  Location: [facility];  Service: Cardiovascular;  Laterality: N/A;  . TEE WITHOUT CARDIOVERSION N/A 01/19/2015   Procedure: TRANSESOPHAGEAL ECHOCARDIOGRAM (TEE);  Surgeon: Sueanne Margarita, MD;  Location: [facility];  Service: Cardiovascular;  Laterality: N/A;  . [place]   pt denies     Current Outpatient Medications  Medication Sig Dispense Refill  . acetaminophen (TYLENOL)  325 MG tablet Take 650 mg by mouth every 6 (six) hours as needed for mild pain or moderate pain.    Marland Kitchen ALPRAZOLAM XR 1 MG 24 hr tablet TAKE 1 TABLET BY MOUTH DAILY 30 tablet 4  . amLODipine (NORVASC) 10 MG tablet TAKE ONE TABLET BY MOUTH DAILY 90 tablet 3  . amoxicillin (AMOXIL) 500 MG capsule Take 4 tablets, 2 grams, 1 hour before each of your three dental procedures. 12 capsule 0  . Ascorbic Acid (VITAMIN C) 1000 MG tablet Take 1,000 mg by mouth 2 (two)  times daily.    Marland Kitchen aspirin-acetaminophen-caffeine (EXCEDRIN EXTRA STRENGTH) 250-250-65 MG tablet Take 2 tablets by mouth daily as needed for headache.     . butalbital-aspirin-caffeine-codeine (FIORINAL WITH CODEINE) 50-325-40-30 MG capsule TAKE ONE CAPSULE BY MOUTH EVERY 4 HOURS AS NEEDED FOR Migraine 30 capsule 0  . diazepam (VALIUM) 5 MG tablet Take 2.5-5 mg by mouth 2 (two) times daily as needed for anxiety.     Marland Kitchen ibuprofen (ADVIL,MOTRIN) 200 MG tablet Take 200-400 mg by mouth 2 (two) times daily as needed (for arthritis in the feet).     Marland Kitchen losartan (COZAAR) 25 MG tablet TAKE ONE TABLET BY MOUTH DAILY 90 tablet 3  . metoprolol tartrate (LOPRESSOR) 25 MG tablet Take 1 tablet (25 mg total) by mouth every 6 (six) hours as needed (Heart Rate >100). 60 tablet 2  . niacin 500 MG tablet Take 500 mg by mouth at bedtime.    . nitroGLYCERIN (NITROSTAT) 0.4 MG SL tablet Place 1 tablet (0.4 mg total) under the tongue every 5 (five) minutes x 3 doses as needed for chest pain. 25 tablet 12  . rosuvastatin (CRESTOR) 20 MG tablet TAKE ONE TABLET BY MOUTH DAILY 90 tablet 2  . Specialty Vitamins Products (CENTRUM SPECIALIST ENERGY) TABS Take 1 tablet by mouth daily with breakfast.    . Testosterone (ANDROGEL) 40.5 MG/2.5GM (1.62%) GEL Place 1 application onto the skin daily. Apply 1 pump to each shoulder every morning    . Turmeric 500 MG CAPS Take 1,000 mg by mouth daily.     . vitamin E 400 UNIT capsule Take 400 Units by mouth at bedtime.     Marland Kitchen zonisamide (ZONEGRAN) 25 MG capsule TAKE TWO CAPSULES BY MOUTH DAILY 180 capsule 3  . aspirin EC 81 MG tablet Take 1 tablet (81 mg total) by mouth daily. 90 tablet 3   Current Facility-Administered Medications  Medication Dose Route Frequency Provider Last Rate Last Admin  . 0.9 %  sodium chloride infusion  500 mL Intravenous Once [place], MD      . 0.9 %  sodium chloride infusion  500 mL Intravenous Once Armbruster, Carlota Raspberry, MD        Allergies:   Dog  epithelium allergy skin test and Mushroom extract complex   Social History:  The patient  reports that he quit smoking about 33 years ago. He quit after 20.00 years of use. He has never used smokeless tobacco. He reports current alcohol use of about 1.0 standard drinks of alcohol per week. He reports that he does not use drugs.   Family History:  The patient's family history includes Arthritis in his father and mother; Diabetes in his father and mother; Heart disease (age of onset: 41) in his father; Hyperlipidemia in his father and mother; Hypertension in his father; Kidney disease in his paternal grandfather; Stroke (age of onset: 10) in his father.    ROS:  Please see  the history of present illness.   Otherwise, review of systems is positive for none.   All other systems are reviewed and negative.   PHYSICAL EXAM: VS:  BP 138/78   Pulse 78   Ht 5\' 7"  (1.702 m)   Wt 149 lb 6.4 oz (67.8 kg)   SpO2 98%   BMI 23.40 kg/m  , BMI Body mass index is 23.4 kg/m. GEN: Well nourished, well developed, in no acute distress  HEENT: normal  Neck: no JVD, carotid bruits, or masses Cardiac: RRR; no murmurs, rubs, or gallops,no edema  Respiratory:  clear to auscultation bilaterally, normal work of breathing GI: soft, nontender, nondistended, + BS MS: no deformity or atrophy  Skin: warm and dry Neuro:  Strength and sensation are intact Psych: euthymic mood, full affect  EKG:  EKG is ordered today. Personal review of the ekg ordered shows SR, rate 78   Recent Labs: 07/29/2019: ALT 65; TSH 1.537 09/29/2019: BUN 24; Creatinine, Ser 1.60; Hemoglobin 13.6; Platelets 229; Potassium 4.1; Sodium 143    Lipid Panel     Component Value Date/Time   CHOL 76 (L) 06/22/2019 1014   TRIG 79 06/22/2019 1014   HDL 40 06/22/2019 1014   CHOLHDL 1.9 06/22/2019 1014   CHOLHDL 2.8 08/22/2018 0645   VLDL 12 08/22/2018 0645   LDLCALC 19 06/22/2019 1014     Wt Readings from Last 3 Encounters:  11/01/19 149 lb  6.4 oz (67.8 kg)  09/29/19 152 lb (68.9 kg)  08/17/19 154 lb 9.6 oz (70.1 kg)      Other studies Reviewed: Additional studies/ records that were reviewed today include: TTE 08/22/18  Review of the above records today demonstrates:   1. The left ventricle has normal systolic function of 123456. The cavity size is normal. There is mild concentric left ventricular hypertrophy. Echo evidence of normal diastolic filling patterns. Normal left ventricular filling pressures.  2. Normal left atrial size.  3. Normal right atrial size.  4. The mitral valve normal in structure. There is mild mitral annular calcification present. Regurgitation is trivial by color flow Doppler . No evidence of mitral valve stenosis.  5. Normal tricuspid valve.  6. The aortic valve possibly bicuspid. Aortic valve regurgitation is mild to moderate by color flow Doppler. The jet is eccentric posteriorly directed.  7. Moderate dilatation of the aortic root.  8. No atrial level shunt detected by color flow Doppler.  9. Consider further evaluation of the ascending aorta with CT angiography.  LHC 08/24/18   Ost LAD lesion is 100% stenosed.  Ost RCA to Prox RCA lesion is 100% stenosed.  Origin lesion is 100% stenosed.  Prox Cx lesion is 50% stenosed.  Monitor 08/04/18 personally reviewed  Sinus bradycardia, normal sinus rhythm and sinus tachycardia. The average heart rate was 67bpm. The heart rate ranged from 43 to 135bpm.  Frequent PVCs single, bigeminal and trigeminal and nonsustained ventricular tachycardia up to 4 beats.  Paroxysmal atrial flutter < 1% burden.  ASSESSMENT AND PLAN:  1. Typical atrial flutter: Currently on amiodarone and Eliquis with a CHA2DS2-VASc of 3.  Status post atrial flutter ablation 09/29/2019.  He remains in sinus rhythm.  We Jlen Wintle stop both amiodarone and Eliquis at this point as he has not had any further arrhythmias.  2. Coronary artery disease: No current angina  3. Hypertension: Mildly  elevated today but otherwise well controlled.  No changes.  4. OSA: CPAP compliance encouraged  Case discussed with primary cardiology  Current medicines  are reviewed at length with the patient today.   The patient does not have concerns regarding his medicines.  The following changes were made today: Stop amiodarone and Eliquis, restart aspirin 81 mg.  Labs/ tests ordered today include:  Orders Placed This Encounter  Procedures  . EKG 12-Lead     Disposition:   FU with Lillyen Schow as needed months  Signed, Franco Duley Meredith Leeds, MD  11/01/2019 2:42 PM     [facility] [address] [place] [address] [phone] (office) [phone] (fax)

## 2019-11-02 ENCOUNTER — Other Ambulatory Visit: Payer: Self-pay | Admitting: Adult Health

## 2019-11-02 MED ORDER — METOPROLOL SUCCINATE ER 100 MG PO TB24: 100.0000 mg | ORAL_TABLET | Freq: Every day | ORAL | 2 refills | Status: DC

## 2019-11-03 NOTE — Telephone Encounter (Signed)
Pt is up to date on his appts and is due for a refill on fioricet. [place] Controlled Substance Registry checked and verified. Pt is filling fioricet appropriately.

## 2019-11-12 ENCOUNTER — Encounter: Payer: Self-pay | Admitting: Adult Health

## 2019-11-12 ENCOUNTER — Other Ambulatory Visit: Payer: Self-pay

## 2019-11-12 DIAGNOSIS — F5104 Psychophysiologic insomnia: Secondary | ICD-10-CM

## 2019-11-12 MED ORDER — LOSARTAN POTASSIUM 25 MG PO TABS: 25.0000 mg | ORAL_TABLET | Freq: Every day | ORAL | 3 refills | Status: DC

## 2019-11-12 MED ORDER — AMLODIPINE BESYLATE 10 MG PO TABS: 10.0000 mg | ORAL_TABLET | Freq: Every day | ORAL | 3 refills | Status: DC

## 2019-11-16 MED ORDER — ALPRAZOLAM ER 1 MG PO TB24: 1.0000 mg | ORAL_TABLET | Freq: Every day | ORAL | 1 refills | Status: DC

## 2020-01-09 ENCOUNTER — Encounter: Payer: Self-pay | Admitting: Adult Health

## 2020-01-10 ENCOUNTER — Encounter: Payer: Self-pay | Admitting: Physician Assistant

## 2020-01-20 NOTE — Progress Notes (Addendum)
Cardiology Office Note    Date:  01/25/2020   ID:  [name], DOB 27-Nov-1945, MRN [id number]  PCP:  Burman Freestone, MD  Cardiologist:  Fransico Him, MD  Electrophysiologist:  None   Chief Complaint: 6 month f/u of CAD, atrial flutter  History of Present Illness:   [name] is a 74 y.o. male with history of CAD s/p CABG 1998, bicuspid aortic valve with aortic insufficiency, dilated ascending aorta and aortic root (4.7cm TAA on CT chest 05/2019), hyperlipidemia, hypertension, OSA (followed by neurology), mild carotid artery disease (by duplex 2009), CKD stage III reportedly due to hypertensive nephrosclerosis, atrial flutter, PVCs, hypogonadism, retinal hemorrhage who presents for follow-up.   He [place] in [place]. He has a known bicuspid AV and dilated aortic root followed at [facility]. He was admitted to [facility] in early 2020 with NSTEMI. Cath 08/2018 showed an occluded left radial graft to the RCA with patent LIMA-LAD with L-R collaterals. Medical therapy was recommended. He was having increased palpitations December 2020 with 30-day monitor showing episodes of atrial flutter in the 120s. He was started on anticoagulation. Repeat echo 08/2019 showed EF 55-60%, mild asymmetric LVH, normal PASP, mild MR, mild-moderate AI, ascending aortic aneurysm of 51mm with aneurysm of aortic root measuring 79mm. He was seen by atrial fib clinic and placed on amiodarone and underwent cardioversion. He subsequently saw Dr. Curt Bears and underwent ablation of his atrial flutter in 09/2019. Last labs personally reviewed 09/2019 with Hgb 13.3, macrocytosis, K 4.2, Cr 1.53, calcium 8.5, 07/2019 TSH wnl, mildly elevated AST/ALT in 07/2019, 06/2019 LDL 19. Of note, CT chest in 05/2019 also demonstrated nonspecific interstitial lung disease.   He returns for routine follow-up overall feeling "fantastic." He just returned from a trip in [place] and was able to walk 27 miles in 4 days. He  also climbed a mountain in [place]. He has not had any angina, dyspnea, syncope, breakthrough afib/palpitations or bleeding.  Past Medical History:  Diagnosis Date  . Anal fissure   . Aortic insufficiency   . Arthritis   . Atrial flutter ([place])   . Bicuspid aortic valve    with mild to moderate AR by echo 08/2019  . BPH (benign prostatic hyperplasia)   . CAD (coronary artery disease)    s/p CABG [place]  . Cataract    bilateral-removed  . CKD (chronic kidney disease), stage III   . Depression   . Dilated aortic root ([place])   . History of blood transfusion 1998   "due to cardiac bypass surgery"  . Hyperlipidemia   . Hypertension   . Hypogonadism in male   . Kidney stones   . Migraine   . Mild carotid artery disease ([place]) 2009  . Myocardial infarction ([place])   . PVC's (premature ventricular contractions)   . Retinal hemorrhage 11/04/2014  . Sleep apnea with use of continuous positive airway pressure (CPAP)   . Thoracic aortic aneurysm (TAA) ([place])     Past Surgical History:  Procedure Laterality Date  . A-FLUTTER ABLATION N/A 09/29/2019   Procedure: A-FLUTTER ABLATION;  Surgeon: Constance Haw, MD;  Location: [facility] LAB;  Service: Cardiovascular;  Laterality: N/A;  . CARDIOVERSION N/A 08/10/2019   Procedure: CARDIOVERSION;  Surgeon: Elouise Munroe, MD;  Location: [facility] ENDOSCOPY;  Service: Cardiovascular;  Laterality: N/A;  . cataracts    . COLONOSCOPY    . CORONARY ARTERY BYPASS GRAFT  1998  . LEFT HEART  CATH AND CORONARY ANGIOGRAPHY N/A 08/24/2018   Procedure: LEFT HEART CATH AND CORONARY ANGIOGRAPHY;  Surgeon: Lorretta Harp, MD;  Location: [facility] LAB;  Service: Cardiovascular;  Laterality: N/A;  . TEE WITHOUT CARDIOVERSION N/A 01/19/2015   Procedure: TRANSESOPHAGEAL ECHOCARDIOGRAM (TEE);  Surgeon: Sueanne Margarita, MD;  Location[facility] END[facility]COPY;  Service: Cardiovascular;  Laterality: N/A;  . [place]   pt denies    Current  Medications: Current Meds  Medication Sig  . acetaminophen (TYLENOL) 325 MG tablet Take 650 mg by mouth every 6 (six) hours as needed for mild pain or moderate pain.  Marland Kitchen ALPRAZolam (ALPRAZOLAM XR) 1 MG 24 hr tablet Take 1 tablet (1 mg total) by mouth daily.  Marland Kitchen amLODipine (NORVASC) 10 MG tablet Take 1 tablet (10 mg total) by mouth daily.  Marland Kitchen amoxicillin (AMOXIL) 500 MG capsule Take 4 tablets, 2 grams, 1 hour before each of your three dental procedures.  . Ascorbic Acid (VITAMIN C) 1000 MG tablet Take 1,000 mg by mouth 2 (two) times daily.  Marland Kitchen aspirin EC 81 MG tablet Take 1 tablet (81 mg total) by mouth daily.  Mar[place] Kitchen aspirin-acetaminophen-caffeine (EXCEDRIN EXTRA STRENGTH) 250-250-65 MG tablet Take 2 tablets by mouth daily as needed for headache.   . butalbital-aspirin-caffeine-codeine (FIORINAL WITH CODEINE) 50-325-40-30 MG capsule TAKE ONE CAPSULE BY MOUTH EVERY 4 HOURS AS NEEDED FOR MIGRAINE  . diazepam (VALIUM) 5 MG tablet Take 2.5-5 mg by mouth 2 (two) times daily as needed for anxiety.   Marland Kitchen ibuprofen (ADVIL,MOTRIN) 200 MG tablet Take 200-400 mg by mouth 2 (two) times daily as needed (for arthritis in the feet).   Marland Kitchen losartan (COZAAR) 25 MG tablet Take 1 tablet (25 mg total) by mouth daily.  . metoprolol succinate (TOPROL-XL) 100 MG 24 hr tablet Take 1 tablet (100 mg total) by mouth daily. Take with or immediately following a meal.  . metoprolol tartrate (LOPRESSOR) 25 MG tablet Take 1 tablet (25 mg total) by mouth every 6 (six) hours as needed (Heart Rate >100).  . niacin 500 MG tablet Take 500 mg by mouth at bedtime.  . rosuvastatin (CRESTOR) 20 MG tablet TAKE ONE TABLET BY MOUTH DAILY  . Specialty Vitamins Products (CENTRUM SPECIALIST ENERGY) TABS Take 1 tablet by mouth daily with breakfast.  . tadalafil (CIALIS) 5 MG tablet Take 5 mg by mouth daily.  . Testosterone (ANDROGEL) 40.5 MG/2.5GM (1.62%) GEL Place 1 application onto the skin daily. Apply 1 pump to each shoulder every morning  . Turmeric  500 MG CAPS Take 1,000 mg by mouth daily.   . vitamin E 400 UNIT capsule Take 400 Units by mouth at bedtime.   Mar[place] Kitchen zonisamide (ZONEGRAN) 25 MG capsule TAKE TWO CAPSULES BY MOUTH DAILY    Allergies:   Dog epithelium allergy skin test and Mushroom extract complex   Social History   Socioeconomic History  . Marital status: Divorced    Spouse name: Not on file  . Number of children: 0  . Years of education: Not on file  . Highest education level: Not on file  Occupational History  . Occupation: retired  Tobacco Use  . Smoking status: Former Smoker    Years: 20.00    Quit date: 07/22/1986    Years since quitting: 33.5  . Smokeless tobacco: Never Used  Vaping Use  . Vaping Use: Never used  Substance and Sexual Activity  . Alcohol use: Yes    Alcohol/week: 1.0 standard drink    Types:  1 Glasses of wine per week    Comment: 0-1 daily  . Drug use: No  . Sexual activity: Not on file  Other Topics Concern  . Not on file  Social History Narrative   Divorced   Secondary school teacher- retired   No children   Has a Neurosurgeon named [name]   Enjoys outdoor activities- hiking, swimming, Control and instrumentation engineer, baseball, writing, reading, cooking   Social Determinants of Radio broadcast assistant Strain:   . Difficulty of Paying Living Expenses:   Food Insecurity:   . Worried About Charity fundraiser in the Last Year:   . Arboriculturist in the Last Year:   Transportation Needs:   . Film/video editor (Medical):   Marland Kitchen Lack of Transportation (Non-Medical):   Physical Activity:   . Days of Exercise per Week:   . Minutes of Exercise per Session:   Stress:   . Feeling of Stress :   Social Connections:   . Frequency of Communication with Friends and Family:   . Frequency of Social Gatherings with Friends and Family:   . Attends Religious Services:   . Active Member of Clubs or Organizations:   . Attends Archivist Meetings:   Marland Kitchen Marital Status:      Family History:  The patient's family  history includes Arthritis in his father and mother; Diabetes in his father and mother; Heart disease (age of onset: 38) in his father; Hyperlipidemia in his father and mother; Hypertension in his father; Kidney disease in his paternal grandfather; Stroke (age of onset: 37) in his father. There is no history of Colon cancer, Colon polyps, Esophageal cancer, Stomach cancer, or Rectal cancer.  ROS:   Please see the history of present illness. .  All other systems are reviewed and otherwise negative.    EKGs/Labs/Other Studies Reviewed:    Studies reviewed are outlined and summarized above. Reports included below if pertinent.  2D Echo 08/2019  1. Left ventricular ejection fraction, by estimation, is 55 to 60%. The  left ventricle has normal function. The left ventricle has no regional  wall motion abnormalities. There is mild asymmetric left ventricular  hypertrophy of the basal-septal segment.  Left ventricular diastolic parameters were normal.  2. Right ventricular systolic function is normal. The right ventricular  size is normal. There is normal pulmonary artery systolic pressure. The  estimated right ventricular systolic pressure is 15.1 mmHg.  3. The mitral valve is degenerative. Mild mitral valve regurgitation. No  evidence of mitral stenosis.  4. The aortic valve has an indeterminant number of cusps. Aortic valve  regurgitation is mild to moderate. No aortic stenosis is present. Aortic  regurgitation PHT measures 282 msec.  5. Ascending aortic aneurysm, 48 mm. Aneurysm of the aortic root,  measuring 50 mm.  6. The inferior vena cava is normal in size with greater than 50%  respiratory variability, suggesting right atrial pressure of 3 mmHg.   Comparison(s): A prior study was performed on 08/22/2018. Prior images  unable to be directly viewed, comparison made by report only. No  significant change from prior study. Aortic valve reported to be tricuspid  on prior TEE. Aortic  root 50 mm on this  study. Asc Ao 48 mm. CT chest at [facility] from 2020 47 mm. Appears stable per  medical record.   CT Chest 05/2019 FINDINGS:   Thoracic inlet/central airways: Thyroid normal. Airway patent.  Mediastinum/hila/axilla: No adenopathy. Small hiatal hernia with circumferential distal esophageal wall thickening  measuring up to 6 mm, slightly increased from 05/27/2018.  Heart/vessels: Postsurgical changes of CABG. Normal heart size. No pericardial effusion. Aneurysmal dilatation of the ascending thoracic aorta measuring up to 4.7 cm, similar to prior. Mild burden atherosclerotic calcifications of the thoracic aorta.   Lungs/pleura: Diffuse subpleural reticulations the lungs. Subsegmental atelectasis in the right lower lobe. No airspace opacities. No suspicious lung nodule or lung mass identified.   Mild Paraseptal emphysema, Upper lobe predominant. No pleural effusion, or pneumothorax.   Upper abdomen: Adreniform thickening of the adrenal glands without discrete nodularity. Similar size and appearance of 2.5 cm fluid attenuation cortical lesion in the upper pole of the left kidney which is most compatible with a cortical simple cyst.   Chest wall/MSK: Healed median sternotomy with intact mid sternotomy wires.  CONCLUSION:    Minimal change in an ascending thoracic aortic aneurysm, measuring 4.7 cm on today's exam.   Circumferential distal and mid esophageal wall thickening, slightly increased from 05/27/2018 suggestive of gastroesophageal reflux/mild esophagitis.   Similar nonspecific interstitial lung disease.     EKG:  EKG is not ordered today but personally reviewed from 11/01/19 showing NSR 78bpm with no acute STT changes  Recent Labs: 07/29/2019: ALT 65; TSH 1.537 09/29/2019: BUN 24; Creatinine, Ser 1.60; Hemoglobin 13.6; Platelets 229; Potassium 4.1; Sodium 143  Recent Lipid Panel    Component Value Date/Time   CHOL 76 (L) 06/22/2019 1014   TRIG 79 06/22/2019 1014    HDL 40 06/22/2019 1014   CHOLHDL 1.9 06/22/2019 1014   CHOLHDL 2.8 08/22/2018 0645   VLDL 12 08/22/2018 0645   LDLCALC 19 06/22/2019 1014    PHYSICAL EXAM:    VS:  BP 128/76   Pulse (!) 58   Ht 5\' 7"  (1.702 m) Comment: per pt  Wt 145 lb 3.2 oz (65.9 kg)   SpO2 98%   BMI 22.74 kg/m   BMI: Body mass index is 22.74 kg/m.  GEN: Well nourished, well developed WM, in no acute distress HEENT: normocephalic, atraumatic Neck: no JVD, carotid bruits, or masses Cardiac: RRR; no murmurs, rubs, or gallops, no edema  Respiratory:  clear to auscultation bilaterally, normal work of breathing GI: soft, nontender, nondistended, + BS MS: no deformity or atrophy Skin: warm and dry, no rash Neuro:  Alert and Oriented x 3, Strength and sensation are intact, follows commands Psych: euthymic mood, full affect  Wt Readings from Last 3 Encounters:  01/25/20 145 lb 3.2 oz (65.9 kg)  11/01/19 149 lb 6.4 oz (67.8 kg)  09/29/19 152 lb (68.9 kg)     ASSESSMENT & PLAN:   1. CAD s/p CABG - clinically stable without angina or dyspnea. Continue ASA, BB, statin. Check CMET as below. Will be due for follow-up lipids in about 6 months, can arrage at next visit. 2. History of atrial flutter - s/p ablation. Amiodarone and Eliquis stopped earlier this year, without clinical recurrence. Continue to monitor. Of note, a CT chest in 2020 did note presence of interstitial lung changes. As atrial flutter can sometimes be associated with respiratory issues, have instructed him to discuss this potential finding of ILD with his PCP regarding whether further workup is indicated. He is currently asymptomatic from that perspective with normal O2 sats and no dyspnea. 3. TAA - this is monitored by [facility], Dr. Clementeen Graham, with planned follow-up and CT in November 2021 per patient. Aneurysm precautions outlined today. Continue BB. 4. Bicuspid aortic valve with mild-moderate aortic insufficency - stable by echo  08/2019. Will  repeat echocardiogram 08/2020, sooner if dictated by 05/2020 follow-up with [facility]. The patient does have SBE ppx on his chart for reasons not totally clear to me - he reports this was historically filled by his cardiologist out [place]. No hx IE or valvular repair (yet). Appears it was filled per a subsequent phone note by [place]. I will review with Dr. Radford Pax. 5. Abnormal LFTs and hypocalcemia by labs 09/2019 - will recheck with labs today.  Disposition: F/u with 6 months with Dr. Radford Pax.  Medication Adjustments/Labs and Tests Ordered: Current medicines are reviewed at length with the patient today.  Concerns regarding medicines are outlined above. Medication changes, Labs and Tests ordered today are summarized above and listed in the Patient Instructions accessible in Encounters.   Signed, Charlie Pitter, PA-C  01/25/2020 8:42 AM    [facility] [address], [place], [place]  [address] Phone: [phone]; Fax: [phone]

## 2020-01-25 ENCOUNTER — Other Ambulatory Visit: Payer: Self-pay

## 2020-01-25 ENCOUNTER — Encounter: Payer: Self-pay | Admitting: Physician Assistant

## 2020-01-25 ENCOUNTER — Ambulatory Visit (INDEPENDENT_AMBULATORY_CARE_PROVIDER_SITE_OTHER): Payer: Medicare Other | Admitting: Physician Assistant

## 2020-01-25 VITALS — BP 128/76 | HR 58 | Ht 67.0 in | Wt 145.2 lb

## 2020-01-25 DIAGNOSIS — Z951 Presence of aortocoronary bypass graft: Secondary | ICD-10-CM | POA: Diagnosis not present

## 2020-01-25 DIAGNOSIS — I351 Nonrheumatic aortic (valve) insufficiency: Secondary | ICD-10-CM

## 2020-01-25 DIAGNOSIS — R7989 Other specified abnormal findings of blood chemistry: Secondary | ICD-10-CM

## 2020-01-25 DIAGNOSIS — I712 Thoracic aortic aneurysm, without rupture, unspecified: Secondary | ICD-10-CM

## 2020-01-25 DIAGNOSIS — I2510 Atherosclerotic heart disease of native coronary artery without angina pectoris: Secondary | ICD-10-CM

## 2020-01-25 DIAGNOSIS — R945 Abnormal results of liver function studies: Secondary | ICD-10-CM

## 2020-01-25 DIAGNOSIS — Q231 Congenital insufficiency of aortic valve: Secondary | ICD-10-CM

## 2020-01-25 DIAGNOSIS — Z8679 Personal history of other diseases of the circulatory system: Secondary | ICD-10-CM | POA: Diagnosis not present

## 2020-01-25 LAB — COMPREHENSIVE METABOLIC PANEL
ALT: 26 IU/L (ref 0–44)
AST: 32 IU/L (ref 0–40)
Albumin/Globulin Ratio: 2.2 (ref 1.2–2.2)
Albumin: 4.3 g/dL (ref 3.7–4.7)
Alkaline Phosphatase: 104 IU/L (ref 48–121)
BUN/Creatinine Ratio: 11 (ref 10–24)
BUN: 17 mg/dL (ref 8–27)
Bilirubin Total: 0.3 mg/dL (ref 0.0–1.2)
CO2: 22 mmol/L (ref 20–29)
Calcium: 9 mg/dL (ref 8.6–10.2)
Chloride: 105 mmol/L (ref 96–106)
Creatinine, Ser: 1.52 mg/dL — ABNORMAL HIGH (ref 0.76–1.27)
GFR calc Af Amer: 53 mL/min/{1.73_m2} — ABNORMAL LOW (ref >59)
GFR calc non Af Amer: 45 mL/min/{1.73_m2} — ABNORMAL LOW (ref >59)
Globulin, Total: 2 g/dL (ref 1.5–4.5)
Glucose: 115 mg/dL — ABNORMAL HIGH (ref 65–99)
Potassium: 4.8 mmol/L (ref 3.5–5.2)
Sodium: 141 mmol/L (ref 134–144)
Total Protein: 6.3 g/dL (ref 6.0–8.5)

## 2020-01-25 NOTE — Patient Instructions (Addendum)
Medication Instructions:  Your physician recommends that you continue on your current medications as directed. Please refer to the Current Medication list given to you today.  *If you need a refill on your cardiac medications before your next appointment, please call your pharmacy*   Lab Work: TODAY:  CMET  If you have labs (blood work) drawn today and your tests are completely normal, you will receive your results only by: Marland Kitchen MyChart Message (if you have MyChart) OR . A paper copy in the mail If you have any lab test that is abnormal or we need to change your treatment, we will call you to review the results.   Testing/Procedures: Your physician has requested that you have an echocardiogram 08/2020. (they will call to schedule) Echocardiography is a painless test that uses sound waves to create images of your heart. It provides your doctor with information about the size and shape of your heart and how well your heart's chambers and valves are working. This procedure takes approximately one hour. There are no restrictions for this procedure.    Follow-Up: At [facility], you and your health needs are our priority.  As part of our continuing mission to provide you with exceptional heart care, we have created designated Provider Care Teams.  These Care Teams include your primary Cardiologist (physician) and Advanced Practice Providers (APPs -  Physician Assistants and Nurse Practitioners) who all work together to provide you with the care you need, when you need it.  We recommend signing up for the patient portal called "MyChart".  Sign up information is provided on this After Visit Summary.  MyChart is used to connect with patients for Virtual Visits (Telemedicine).  Patients are able to view lab/test results, encounter notes, upcoming appointments, etc.  Non-urgent messages can be sent to your provider as well.   To learn more about what you can do with MyChart, go to  [email or web address].    Your next appointment:   6 month(s)  The format for your next appointment:   In Person  Provider:   You may see Fransico Him, MD or one of the following Advanced Practice Providers on your designated Care Team:    Melina Copa, PA-C  Ermalinda Barrios, PA-C    Other Instructions  Your CT scan in 2020 showed a suggestion of nonspecific interstitial lung disease. I would recommend you discuss this finding with your primary care provider to determine if any further workup is needed down the road.  Information About Your Aneurysm  One of your tests has shown an aneurysm of your aorta. The word "aneurysm" refers to a bulge in an artery (blood vessel). Most people think of them in the context of an emergency, but yours was found incidentally. At this point there is nothing you need to do from a procedure standpoint, but there are some important things to keep in mind for day-to-day life.  Mainstays of therapy for aneurysms include very good blood pressure control, healthy lifestyle, and avoiding tobacco products and street drugs. Research has raised concern that antibiotics in the fluoroquinolone class could be associated with increased risk of having an aneurysm develop or tear. This includes medicines that end in "floxacin," like Cipro or Levaquin. Make sure to discuss this information with other healthcare providers if you require antibiotics.  Since aneurysms can run in families, you should discuss your diagnosis with first degree relatives as they may need to be screened for this. Regular mild-moderate physical exercise is important, but avoid heavy  lifting/weight lifting over 30lbs, chopping wood, shoveling snow or digging heavy earth with a shovel. It is best to avoid activities that cause grunting or straining (medically referred to as a "Valsalva maneuver"). This happens when a person bears down against a closed throat to increase the strength of arm or abdominal  muscles. There's often a tendency to do this when lifting heavy weights, doing sit-ups, push-ups or chin-ups, etc., but it may be harmful.  This is a finding I would expect to be monitored periodically by your cardiology team. Most unruptured thoracic aortic aneurysms cause no symptoms, so they are often found during exams for other conditions. Contact a health care provider if you develop any discomfort in your upper back, neck, abdomen, trouble swallowing, cough or hoarseness, or unexplained weight loss. Get help right away if you develop severe pain in your upper back or abdomen that may move into your chest and arms, or any other concerning symptoms such as shortness of breath or fever.

## 2020-01-31 ENCOUNTER — Telehealth: Payer: Self-pay | Admitting: Physician Assistant

## 2020-01-31 NOTE — Telephone Encounter (Signed)
    Please let pt know I discussed his antibiotic dental prophylaxis with Dr. Radford Pax - many years ago he had been told to take antibiotics prior to dental procedure but a doctor out [place], but I did not see a clear indication. Perhaps in the past it was because of his bicuspid aortic valve. Per most recent guidelines, you do not have to take these pre-dental antibiotics from a heart standpoint unless he had had that valve REPAIRED - which he hadn't yet. So if he's been told for non-heart reasons he needs antibiotics before dental procedures he should follow those instructions but from heart standpoint no need to take antibiotics before dental procedures or cleanings. Thanks.  Drew Biernat PA-C

## 2020-01-31 NOTE — Telephone Encounter (Signed)
Pt has been made aware that there is no reason from a heart standpoint that he had to take pre-dental antibiotics but if he had been told for a non-cardiac reason, to follow those instructions. Pt stated that no other dr has told him that he needed to and he would let his dentist office know. Pt was appreciative of the information.

## 2020-02-25 ENCOUNTER — Other Ambulatory Visit: Payer: Self-pay | Admitting: Neurology

## 2020-02-29 ENCOUNTER — Other Ambulatory Visit: Payer: Self-pay | Admitting: Adult Health

## 2020-03-06 ENCOUNTER — Encounter (INDEPENDENT_AMBULATORY_CARE_PROVIDER_SITE_OTHER): Payer: Self-pay | Admitting: Ophthalmology

## 2020-03-06 ENCOUNTER — Other Ambulatory Visit: Payer: Self-pay

## 2020-03-06 ENCOUNTER — Ambulatory Visit (INDEPENDENT_AMBULATORY_CARE_PROVIDER_SITE_OTHER): Payer: Medicare Other | Admitting: Ophthalmology

## 2020-03-06 DIAGNOSIS — H35073 Retinal telangiectasis, bilateral: Secondary | ICD-10-CM | POA: Insufficient documentation

## 2020-03-06 DIAGNOSIS — H353132 Nonexudative age-related macular degeneration, bilateral, intermediate dry stage: Secondary | ICD-10-CM | POA: Diagnosis not present

## 2020-03-06 DIAGNOSIS — I2510 Atherosclerotic heart disease of native coronary artery without angina pectoris: Secondary | ICD-10-CM

## 2020-03-06 DIAGNOSIS — Z961 Presence of intraocular lens: Secondary | ICD-10-CM | POA: Diagnosis not present

## 2020-03-06 NOTE — Assessment & Plan Note (Addendum)
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  OS, outer retinal Atrophy present yet without the cavitary cystoid change  Patient continues on CPAP therapy

## 2020-03-06 NOTE — Progress Notes (Signed)
03/06/2020     CHIEF COMPLAINT Patient presents for Retina Follow Up   HISTORY OF PRESENT ILLNESS: [name] is a 74 y.o. male who presents to the clinic today for:   HPI    Retina Follow Up    Patient presents with  Dry AMD.  In both eyes.  This started 1 year ago.  Severity is mild.  Duration of 1 year.  Since onset it is stable.          Comments    1 Year F/U OU  Pt denies noticeable changes to [place] OU since last visit. Pt denies ocular pain, flashes of light, or floaters OU.         Last edited by Rockie Neighbours, [place] on 03/06/2020  8:08 AM. (History)      Referring physician: Burman Freestone, MD [facility] [address],  [place] [address]  HISTORICAL INFORMATION:   Selected notes from the [facility]    Lab Results  Component Value Date   HGBA1C 5.9 (H) 08/22/2018     CURRENT MEDICATIONS: No current outpatient medications on file. (Ophthalmic Drugs)   No current facility-administered medications for this visit. (Ophthalmic Drugs)   Current Outpatient Medications (Other)  Medication Sig  . butalbital-aspirin-caffeine-codeine (FIORINAL WITH CODEINE) 50-325-40-30 MG capsule TAKE ONE CAPSULE BY MOUTH EVERY 4 HOURS AS NEEDED FOR MIGRAINE  . acetaminophen (TYLENOL) 325 MG tablet Take 650 mg by mouth every 6 (six) hours as needed for mild pain or moderate pain.  Marland Kitchen ALPRAZolam (ALPRAZOLAM XR) 1 MG 24 hr tablet Take 1 tablet (1 mg total) by mouth daily.  Marland Kitchen amLODipine (NORVASC) 10 MG tablet Take 1 tablet (10 mg total) by mouth daily.  Marland Kitchen amoxicillin (AMOXIL) 500 MG capsule Take 4 tablets, 2 grams, 1 hour before each of your three dental procedures.  . Ascorbic Acid (VITAMIN C) 1000 MG tablet Take 1,000 mg by mouth 2 (two) times daily.  Marland Kitchen aspirin EC 81 MG tablet Take 1 tablet (81 mg total) by mouth daily.  Marland Kitchen aspirin-acetaminophen-caffeine (EXCEDRIN EXTRA STRENGTH) 250-250-65 MG tablet Take 2 tablets by mouth daily as needed for headache.     . diazepam (VALIUM) 5 MG tablet Take 2.5-5 mg by mouth 2 (two) times daily as needed for anxiety.   Marland Kitchen ibuprofen (ADVIL,MOTRIN) 200 MG tablet Take 200-400 mg by mouth 2 (two) times daily as needed (for arthritis in the feet).   Marland Kitchen losartan (COZAAR) 25 MG tablet Take 1 tablet (25 mg total) by mouth daily.  . metoprolol succinate (TOPROL-XL) 100 MG 24 hr tablet Take 1 tablet (100 mg total) by mouth daily. Take with or immediately following a meal.  . metoprolol tartrate (LOPRESSOR) 25 MG tablet Take 1 tablet (25 mg total) by mouth every 6 (six) hours as needed (Heart Rate >100).  . niacin 500 MG tablet Take 500 mg by mouth at bedtime.  . rosuvastatin (CRESTOR) 20 MG tablet TAKE ONE TABLET BY MOUTH DAILY  . Specialty Vitamins Products (CENTRUM SPECIALIST ENERGY) TABS Take 1 tablet by mouth daily with breakfast.  . tadalafil (CIALIS) 5 MG tablet Take 5 mg by mouth daily.  . Testosterone (ANDROGEL) 40.5 MG/2.5GM (1.62%) GEL Place 1 application onto the skin daily. Apply 1 pump to each shoulder every morning  . Turmeric 500 MG CAPS Take 1,000 mg by mouth daily.   . vitamin E 400 UNIT capsule Take 400 Units by mouth at bedtime.   Marland Kitchen zonisamide (ZONEGRAN) 25  MG capsule TAKE TWO CAPSULES BY MOUTH DAILY   Current Facility-Administered Medications (Other)  Medication Route  . 0.9 %  sodium chloride infusion Intravenous  . 0.9 %  sodium chloride infusion Intravenous      REVIEW OF SYSTEMS:    ALLERGIES Allergies  Allergen Reactions  . Dog Epithelium Allergy Skin Test Itching, Anxiety, Palpitations and Other (See Comments)  . Mushroom Extract Complex Nausea Only and Other (See Comments)    Severe vertigo and headaches    PAST MEDICAL HISTORY Past Medical History:  Diagnosis Date  . Anal fissure   . Aortic insufficiency   . Arthritis   . Atrial flutter ([place])   . Bicuspid aortic valve    with mild to moderate AR by echo 08/2019  . BPH (benign prostatic hyperplasia)   . CAD (coronary artery  disease)    s/p CABG [place]  . Cataract    bilateral-removed  . CKD (chronic kidney disease), stage III   . Depression   . Dilated aortic root ([place])   . History of blood transfusion 1998   "due to cardiac bypass surgery"  . Hyperlipidemia   . Hypertension   . Hypogonadism in male   . Kidney stones   . Migraine   . Mild carotid artery disease ([place]) 2009  . Myocardial infarction ([place])   . PVC's (premature ventricular contractions)   . Retinal hemorrhage 11/04/2014  . Sleep apnea with use of continuous positive airway pressure (CPAP)   . Thoracic aortic aneurysm (TAA) ([place])    Past Surgical History:  Procedure Laterality Date  . A-FLUTTER ABLATION N/A 09/29/2019   Procedure: A-FLUTTER ABLATION;  Surgeon: Constance Haw, MD;  Location: [facility];  Service: Cardiovascular;  Laterality: N/A;  . CARDIOVERSION N/A 08/10/2019   Procedure: CARDIOVERSION;  Surgeon: Elouise Munroe, MD;  Location: [facility] ENDOSCOPY;  Service: Cardiovascular;  Laterality: N/A;  . cataracts    . COLONOSCOPY    . CORONARY ARTERY BYPASS GRAFT  1998  . LEFT HEART CATH AND CORONARY ANGIOGRAPHY N/A 08/24/2018   Procedure: LEFT HEART CATH AND CORONARY ANGIOGRAPHY;  Surgeon: Lorretta Harp, MD;  Location: [facility];  Service: Cardiovascular;  Laterality: N/A;  . TEE WITHOUT CARDIOVERSION N/A 01/19/2015   Procedure: TRANSESOPHAGEAL ECHOCARDIOGRAM (TEE);  Surgeon: Sueanne Margarita, MD;  Location: [facility] ENDOSCOPY;  Service: Cardiovascular;  Laterality: N/A;  . [place]   pt denies    FAMILY HISTORY Family History  Problem Relation Age of Onset  . Arthritis Mother        deceased  . Hyperlipidemia Mother   . Diabetes Mother   . Arthritis Father   . Hyperlipidemia Father   . Heart disease Father 51  . Stroke Father 39       deceased  . Hypertension Father   . Diabetes Father   . Kidney disease Paternal Grandfather   . Colon cancer Neg Hx   . Colon polyps Neg  Hx   . Esophageal cancer Neg Hx   . Stomach cancer Neg Hx   . Rectal cancer Neg Hx     SOCIAL HISTORY Social History   Tobacco Use  . Smoking status: Former Smoker    Years: 20.00    Quit date: 07/22/1986    Years since quitting: 33.6  . Smokeless tobacco: Never Used  Vaping Use  . Vaping Use: Never used  Substance Use Topics  . Alcohol use: Yes  Alcohol/week: 1.0 standard drink    Types: 1 Glasses of wine per week    Comment: 0-1 daily  . Drug use: No         OPHTHALMIC EXAM:  Base Eye Exam    Visual Acuity (ETDRS)      Right Left   Dist [place] 20/40 -2 20/30 -2   Dist ph [place] 20/30 -2 20/30 +2       Tonometry (Tonopen, 8:12 AM)      Right Left   Pressure 10 17       Pupils      Pupils Dark Light Shape React APD   Right PERRL 4 3 Round Brisk None   Left PERRL 4 3 Round Brisk None       Visual Fields (Counting fingers)      Left Right    Full Full       Extraocular Movement      Right Left    Full Full       Neuro/Psych    Oriented x3: Yes   Mood/Affect: Normal       Dilation    Both eyes: 1.0% Mydriacyl, 2.5% Phenylephrine @ 8:12 AM          IMAGING AND PROCEDURES  Imaging and Procedures for 03/06/20  OCT, Retina - OU - Both Eyes       Right Eye Quality was good. Scan locations included subfoveal. Central Foveal Thickness: 233. Progression has been stable. Findings include subretinal hyper-reflective material.   Left Eye Quality was good. Scan locations included subfoveal. Central Foveal Thickness: 163. Progression has improved. Findings include subretinal hyper-reflective material, outer retinal atrophy.                 ASSESSMENT/PLAN:  Retinal telangiectasia of both eyes Now 5 years on CPAP therapy with no progression of outer retinal cavitary lesions in the macular region that can be ascribed to retinal telangiectasia, MAC-TEL.  OS, outer retinal Atrophy present yet without the cavitary cystoid change  Patient continues  on CPAP therapy  Intermediate stage nonexudative age-related macular degeneration of both eyes The nature of age--related macular degeneration was discussed with the patient as well as the distinction between dry and wet types. Checking an Amsler Grid daily with advice to return immediately should a distortion develop, was given to the patient. The patient 's smoking status now and in the past was determined and advice based on the AREDS study was provided regarding the consumption of antioxidant supplements. AREDS 2 vitamin formulation was recommended. Consumption of dark leafy vegetables and fresh fruits of various colors was recommended. Treatment modalities for wet macular degeneration particularly the use of intravitreal injections of anti-blood vessel growth factors was discussed with the patient. Avastin, Lucentis, and Eylea are the available options. On occasion, therapy includes the use of photodynamic therapy and thermal laser. Stressed to the patient do not rub eyes.  Patient was advised to check Amsler Grid daily and return immediately if changes are noted. Instructions on using the grid were given to the patient. All patient questions were answered.      ICD-10-CM   1. Retinal telangiectasia of both eyes  H35.073 OCT, Retina - OU - Both Eyes  2. Intermediate stage nonexudative age-related macular degeneration of both eyes  H35.3132   3. Pseudophakia  Z96.1     1.  Patient on CPAP now for 5 years after my prompting to be evaluated for sleep apnea.  Sleep apnea was in fact  discovered, therapy has halted progression of cavitary lesions from macular telangiectasis, particularly left eye.  Stable acuity.  2.  I reassured the patient that this likely also slows the progression of age-related macular degeneration  3.  Ophthalmic Meds Ordered this visit:  No orders of the defined types were placed in this encounter.      Return in about 1 year (around 03/06/2021) for COLOR FP, OCT, DILATE  OU.  Patient Instructions  Age-Related Macular Degeneration  Age-related macular degeneration (AMD) is an eye disease related to aging. The disease causes a loss of central vision. Central vision allows a person to see objects clearly and do daily tasks like reading and driving. There are two main types of AMD:  Dry AMD. People with this type generally lose their vision slowly. This is the most common type of AMD. Some people with dry AMD notice very little change in their vision as they age.  Wet AMD. People with this type can lose their vision quickly. What are the causes? This condition is caused by damage to the part of the eye that provides you with central vision (macula).  Dry AMD happens when deposits in the macula cause light-sensitive cells to slowly break down.  Wet AMD happens when abnormal blood vessels grow under the macula and leak blood and fluid. What increases the risk? You are more likely to develop this condition if you:  Are 49 years old or older, and especially 8 years old or older.  Smoke.  Are obese.  Have a family history of AMD.  Have high cholesterol, high blood pressure, or heart disease.  Have been exposed to high levels of ultraviolet (UV) light and blue light.  Are white (Caucasian).  Are male. What are the signs or symptoms? Common symptoms of this condition include:  Blurred vision, especially when reading print material. The blurred vision often improves in brighter light.  A blurred or blind spot in the center of your field of vision that is small but growing larger.  Bright colors seeming less bright than they used to be.  Decreased ability to recognize and see faces.  One eye seeing worse than the other.  Decreased ability to adapt to dimly lit rooms.  Straight lines appearing crooked or wavy. How is this diagnosed? This condition is diagnosed based on your symptoms and an eye exam. During the eye exam:  Eye drops will be  placed into your eyes to enlarge (dilate) your pupils. This will allow your health care provider to see the back of your eye.  You may be asked to look at an image that looks like a checkerboard (Amsler grid). Early changes in your central vision may cause the grid to appear distorted. After the exam, you may be given one or both of these tests:  Fluorescein angiogram. This test determines whether you have dry or wet AMD.  Optical coherence tomography (OCT) test to evaluate deep layers of the retina. How is this treated? There is no cure for this condition, but treatment can help to slow down progression of the disease. This condition may be treated with:  Supplements, including vitamin C, vitamin E, beta carotene, and zinc.  Laser surgery to destroy new blood vessels or leaking blood vessels in your eye.  Injections of medicines into your eye to slow down the formation of abnormal blood vessels that may leak. These injections may need to be repeated on a routine basis. Follow these instructions at home:  Take over-the-counter  and prescription medicines only as told by your health care provider.  Take vitamins and supplements as told by your health care provider.  Ask your health care provider for an Amsler grid. Use it every day to check each eye for vision changes.  Get an eye exam as often as told by your health care provider. Make sure to get an eye exam at least once every year.  Keep all follow-up visits as told by your health care provider. This is important. Contact a health care provider if:  You notice any new changes in your vision. Get help right away if:  You suddenly lose vision or develop pain in the eye. Summary  Age-related macular degeneration (AMD) is an eye disease related to aging. There are two types of this condition: dry AMD and wet AMD.  This condition is caused by damage to the part of the eye that provides you with central vision (macula).  Once  diagnosed with AMD, make sure to get an eye exam every year, take supplements and vitamins as directed, use an Amsler grid at home, and follow up with your health care provider. This information is not intended to replace advice given to you by your health care provider. Make sure you discuss any questions you have with your health care provider. Document Revised: 01/14/2018 Document Reviewed: 01/14/2018 Elsevier Patient Education  [place]. Sleep Apnea Sleep apnea is a condition in which breathing pauses or becomes shallow during sleep. Episodes of sleep apnea usually last 10 seconds or longer, and they may occur as many as 20 times an hour. Sleep apnea disrupts your sleep and keeps your body from getting the rest that it needs. This condition can increase your risk of certain health problems, including:  Heart attack.  Stroke.  Obesity.  Diabetes.  Heart failure.  Irregular heartbeat. What are the causes? There are three kinds of sleep apnea:  Obstructive sleep apnea. This kind is caused by a blocked or collapsed airway.  Central sleep apnea. This kind happens when the part of the brain that controls breathing does not send the correct signals to the muscles that control breathing.  Mixed sleep apnea. This is a combination of obstructive and central sleep apnea. The most common cause of this condition is a collapsed or blocked airway. An airway can collapse or become blocked if:  Your throat muscles are abnormally relaxed.  Your tongue and tonsils are larger than normal.  You are overweight.  Your airway is smaller than normal. What increases the risk? You are more likely to develop this condition if you:  Are overweight.  Smoke.  Have a smaller than normal airway.  Are elderly.  Are male.  Drink alcohol.  Take sedatives or tranquilizers.  Have a family history of sleep apnea. What are the signs or symptoms? Symptoms of this condition  include:  Trouble staying asleep.  Daytime sleepiness and tiredness.  Irritability.  Loud snoring.  Morning headaches.  Trouble concentrating.  Forgetfulness.  Decreased interest in sex.  Unexplained sleepiness.  Mood swings.  Personality changes.  Feelings of depression.  Waking up often during the night to urinate.  Dry mouth.  Sore throat. How is this diagnosed? This condition may be diagnosed with:  A medical history.  A physical exam.  A series of tests that are done while you are sleeping (sleep study). These tests are usually done in a sleep lab, but they may also be done at home. How is this treated?  Treatment for this condition aims to restore normal breathing and to ease symptoms during sleep. It may involve managing health issues that can affect breathing, such as high blood pressure or obesity. Treatment may include:  Sleeping on your side.  Using a decongestant if you have nasal congestion.  Avoiding the use of depressants, including alcohol, sedatives, and narcotics.  Losing weight if you are overweight.  Making changes to your diet.  Quitting smoking.  Using a device to open your airway while you sleep, such as: ? An oral appliance. This is a custom-made mouthpiece that shifts your lower jaw forward. ? A continuous positive airway pressure (CPAP) device. This device blows air through a mask when you breathe out (exhale). ? A nasal expiratory positive airway pressure (EPAP) device. This device has valves that you put into each nostril. ? A bi-level positive airway pressure (BPAP) device. This device blows air through a mask when you breathe in (inhale) and breathe out (exhale).  Having surgery if other treatments do not work. During surgery, excess tissue is removed to create a wider airway. It is important to get treatment for sleep apnea. Without treatment, this condition can lead to:  High blood pressure.  Coronary artery  disease.  In men, an inability to achieve or maintain an erection (impotence).  Reduced thinking abilities. Follow these instructions at home: Lifestyle  Make any lifestyle changes that your health care provider recommends.  Eat a healthy, well-balanced diet.  Take steps to lose weight if you are overweight.  Avoid using depressants, including alcohol, sedatives, and narcotics.  Do not use any products that contain nicotine or tobacco, such as cigarettes, e-cigarettes, and chewing tobacco. If you need help quitting, ask your health care provider. General instructions  Take over-the-counter and prescription medicines only as told by your health care provider.  If you were given a device to open your airway while you sleep, use it only as told by your health care provider.  If you are having surgery, make sure to tell your health care provider you have sleep apnea. You may need to bring your device with you.  Keep all follow-up visits as told by your health care provider. This is important. Contact a health care provider if:  The device that you received to open your airway during sleep is uncomfortable or does not seem to be working.  Your symptoms do not improve.  Your symptoms get worse. Get help right away if:  You develop: ? Chest pain. ? Shortness of breath. ? Discomfort in your back, arms, or stomach.  You have: ? Trouble speaking. ? Weakness on one side of your body. ? Drooping in your face. These symptoms may represent a serious problem that is an emergency. Do not wait to see if the symptoms will go away. Get medical help right away. Call your local emergency services (911 in the [place] Do not drive yourself to the hospital. Summary  Sleep apnea is a condition in which breathing pauses or becomes shallow during sleep.  The most common cause is a collapsed or blocked airway.  The goal of treatment is to restore normal breathing and to ease symptoms during  sleep. This information is not intended to replace advice given to you by your health care provider. Make sure you discuss any questions you have with your health care provider. Document Revised: 12/23/2018 Document Reviewed: 03/03/2018 Elsevier Patient Education  [place] the diagnoses, plan, and follow up  with the patient and they expressed understanding.  Patient expressed understanding of the importance of proper follow up care.   Clent Demark Skye Plamondon M.D. Diseases & Surgery of the Retina and Vitreous [facility] [place] 03/06/20     Abbreviations: M myopia (nearsighted); A astigmatism; H hyperopia (farsighted); P presbyopia; Mrx spectacle prescription;  CTL contact lenses; OD right eye; OS left eye; OU both eyes  XT exotropia; ET esotropia; PEK punctate epithelial keratitis; PEE punctate epithelial erosions; DES dry eye syndrome; MGD meibomian gland dysfunction; ATs artificial tears; PFAT's preservative free artificial tears; [place] nuclear sclerotic cataract; PSC posterior subcapsular cataract; ERM epi-retinal membrane; PVD posterior vitreous detachment; RD retinal detachment; DM diabetes mellitus; DR diabetic retinopathy; NPDR non-proliferative diabetic retinopathy; PDR proliferative diabetic retinopathy; CSME clinically significant macular edema; DME diabetic macular edema; dbh dot blot hemorrhages; CWS cotton wool spot; POAG primary open angle glaucoma; C/D cup-to-disc ratio; HVF humphrey visual field; GVF goldmann visual field; OCT optical coherence tomography; IOP intraocular pressure; BRVO Branch retinal vein occlusion; CRVO central retinal vein occlusion; CRAO central retinal artery occlusion; BRAO branch retinal artery occlusion; RT retinal tear; SB scleral buckle; PPV pars plana vitrectomy; VH Vitreous hemorrhage; PRP panretinal laser photocoagulation; IVK intravitreal kenalog; VMT vitreomacular traction; MH Macular hole;  NVD neovascularization of the disc;  NVE neovascularization elsewhere; AREDS age related eye disease study; ARMD age related macular degeneration; POAG primary open angle glaucoma; EBMD epithelial/anterior basement membrane dystrophy; ACIOL anterior chamber intraocular lens; IOL intraocular lens; PCIOL posterior chamber intraocular lens; Phaco/IOL phacoemulsification with intraocular lens placement; [place] photorefractive keratectomy; LASIK laser assisted in situ keratomileusis; HTN hypertension; DM diabetes mellitus; COPD chronic obstructive pulmonary disease

## 2020-03-06 NOTE — Assessment & Plan Note (Signed)

## 2020-03-06 NOTE — Patient Instructions (Signed)
Age-Related Macular Degeneration  Age-related macular degeneration (AMD) is an eye disease related to aging. The disease causes a loss of central vision. Central vision allows a person to see objects clearly and do daily tasks like reading and driving. There are two main types of AMD:  Dry AMD. People with this type generally lose their vision slowly. This is the most common type of AMD. Some people with dry AMD notice very little change in their vision as they age.  Wet AMD. People with this type can lose their vision quickly. What are the causes? This condition is caused by damage to the part of the eye that provides you with central vision (macula).  Dry AMD happens when deposits in the macula cause light-sensitive cells to slowly break down.  Wet AMD happens when abnormal blood vessels grow under the macula and leak blood and fluid. What increases the risk? You are more likely to develop this condition if you:  Are 50 years old or older, and especially 75 years old or older.  Smoke.  Are obese.  Have a family history of AMD.  Have high cholesterol, high blood pressure, or heart disease.  Have been exposed to high levels of ultraviolet (UV) light and blue light.  Are white (Caucasian).  Are male. What are the signs or symptoms? Common symptoms of this condition include:  Blurred vision, especially when reading print material. The blurred vision often improves in brighter light.  A blurred or blind spot in the center of your field of vision that is small but growing larger.  Bright colors seeming less bright than they used to be.  Decreased ability to recognize and see faces.  One eye seeing worse than the other.  Decreased ability to adapt to dimly lit rooms.  Straight lines appearing crooked or wavy. How is this diagnosed? This condition is diagnosed based on your symptoms and an eye exam. During the eye exam:  Eye drops will be placed into your eyes to  enlarge (dilate) your pupils. This will allow your health care provider to see the back of your eye.  You may be asked to look at an image that looks like a checkerboard (Amsler grid). Early changes in your central vision may cause the grid to appear distorted. After the exam, you may be given one or both of these tests:  Fluorescein angiogram. This test determines whether you have dry or wet AMD.  Optical coherence tomography (OCT) test to evaluate deep layers of the retina. How is this treated? There is no cure for this condition, but treatment can help to slow down progression of the disease. This condition may be treated with:  Supplements, including vitamin C, vitamin E, beta carotene, and zinc.  Laser surgery to destroy new blood vessels or leaking blood vessels in your eye.  Injections of medicines into your eye to slow down the formation of abnormal blood vessels that may leak. These injections may need to be repeated on a routine basis. Follow these instructions at home:  Take over-the-counter and prescription medicines only as told by your health care provider.  Take vitamins and supplements as told by your health care provider.  Ask your health care provider for an Amsler grid. Use it every day to check each eye for vision changes.  Get an eye exam as often as told by your health care provider. Make sure to get an eye exam at least once every year.  Keep all follow-up visits as told by   your health care provider. This is important. Contact a health care provider if:  You notice any new changes in your vision. Get help right away if:  You suddenly lose vision or develop pain in the eye. Summary  Age-related macular degeneration (AMD) is an eye disease related to aging. There are two types of this condition: dry AMD and wet AMD.  This condition is caused by damage to the part of the eye that provides you with central vision (macula).  Once diagnosed with AMD, make sure  to get an eye exam every year, take supplements and vitamins as directed, use an Amsler grid at home, and follow up with your health care provider. This information is not intended to replace advice given to you by your health care provider. Make sure you discuss any questions you have with your health care provider. Document Revised: 01/14/2018 Document Reviewed: 01/14/2018 Elsevier Patient Education  [place]. Sleep Apnea Sleep apnea is a condition in which breathing pauses or becomes shallow during sleep. Episodes of sleep apnea usually last 10 seconds or longer, and they may occur as many as 20 times an hour. Sleep apnea disrupts your sleep and keeps your body from getting the rest that it needs. This condition can increase your risk of certain health problems, including:  Heart attack.  Stroke.  Obesity.  Diabetes.  Heart failure.  Irregular heartbeat. What are the causes? There are three kinds of sleep apnea:  Obstructive sleep apnea. This kind is caused by a blocked or collapsed airway.  Central sleep apnea. This kind happens when the part of the brain that controls breathing does not send the correct signals to the muscles that control breathing.  Mixed sleep apnea. This is a combination of obstructive and central sleep apnea. The most common cause of this condition is a collapsed or blocked airway. An airway can collapse or become blocked if:  Your throat muscles are abnormally relaxed.  Your tongue and tonsils are larger than normal.  You are overweight.  Your airway is smaller than normal. What increases the risk? You are more likely to develop this condition if you:  Are overweight.  Smoke.  Have a smaller than normal airway.  Are elderly.  Are male.  Drink alcohol.  Take sedatives or tranquilizers.  Have a family history of sleep apnea. What are the signs or symptoms? Symptoms of this condition include:  Trouble staying asleep.  Daytime  sleepiness and tiredness.  Irritability.  Loud snoring.  Morning headaches.  Trouble concentrating.  Forgetfulness.  Decreased interest in sex.  Unexplained sleepiness.  Mood swings.  Personality changes.  Feelings of depression.  Waking up often during the night to urinate.  Dry mouth.  Sore throat. How is this diagnosed? This condition may be diagnosed with:  A medical history.  A physical exam.  A series of tests that are done while you are sleeping (sleep study). These tests are usually done in a sleep lab, but they may also be done at home. How is this treated? Treatment for this condition aims to restore normal breathing and to ease symptoms during sleep. It may involve managing health issues that can affect breathing, such as high blood pressure or obesity. Treatment may include:  Sleeping on your side.  Using a decongestant if you have nasal congestion.  Avoiding the use of depressants, including alcohol, sedatives, and narcotics.  Losing weight if you are overweight.  Making changes to your diet.  Quitting smoking.  Using  a device to open your airway while you sleep, such as: ? An oral appliance. This is a custom-made mouthpiece that shifts your lower jaw forward. ? A continuous positive airway pressure (CPAP) device. This device blows air through a mask when you breathe out (exhale). ? A nasal expiratory positive airway pressure (EPAP) device. This device has valves that you put into each nostril. ? A bi-level positive airway pressure (BPAP) device. This device blows air through a mask when you breathe in (inhale) and breathe out (exhale).  Having surgery if other treatments do not work. During surgery, excess tissue is removed to create a wider airway. It is important to get treatment for sleep apnea. Without treatment, this condition can lead to:  High blood pressure.  Coronary artery disease.  In men, an inability to achieve or maintain an  erection (impotence).  Reduced thinking abilities. Follow these instructions at home: Lifestyle  Make any lifestyle changes that your health care provider recommends.  Eat a healthy, well-balanced diet.  Take steps to lose weight if you are overweight.  Avoid using depressants, including alcohol, sedatives, and narcotics.  Do not use any products that contain nicotine or tobacco, such as cigarettes, e-cigarettes, and chewing tobacco. If you need help quitting, ask your health care provider. General instructions  Take over-the-counter and prescription medicines only as told by your health care provider.  If you were given a device to open your airway while you sleep, use it only as told by your health care provider.  If you are having surgery, make sure to tell your health care provider you have sleep apnea. You may need to bring your device with you.  Keep all follow-up visits as told by your health care provider. This is important. Contact a health care provider if:  The device that you received to open your airway during sleep is uncomfortable or does not seem to be working.  Your symptoms do not improve.  Your symptoms get worse. Get help right away if:  You develop: ? Chest pain. ? Shortness of breath. ? Discomfort in your back, arms, or stomach.  You have: ? Trouble speaking. ? Weakness on one side of your body. ? Drooping in your face. These symptoms may represent a serious problem that is an emergency. Do not wait to see if the symptoms will go away. Get medical help right away. Call your local emergency services (911 in the [place] Do not drive yourself to the hospital. Summary  Sleep apnea is a condition in which breathing pauses or becomes shallow during sleep.  The most common cause is a collapsed or blocked airway.  The goal of treatment is to restore normal breathing and to ease symptoms during sleep. This information is not intended to replace advice  given to you by your health care provider. Make sure you discuss any questions you have with your health care provider. Document Revised: 12/23/2018 Document Reviewed: 03/03/2018 Elsevier Patient Education  [place].

## 2020-04-03 NOTE — Telephone Encounter (Signed)
Hi Jennifer! Please let patient know he had had a CT of the chest at [facility] in 05/2019 which showed "Lungs/pleura: Diffuse subpleural reticulations the lungs. Subsegmental atelectasis in the right lower lobe. No airspace opacities. No suspicious lung nodule or lung mass identified. Mild Paraseptal emphysema, Upper lobe predominant. No pleural effusion, or pneumothorax. Similar nonspecific interstitial lung disease." I am not sure we can fax a copy of this report since it is only in [place]. I would also be happy for our team to place a referral to have this formally evaluated by pulmonology to see if it is something he needs further workup for. Please review our med list with him - it includes amlodpine,  Losartan, and metoprolol. He's been on all 3 of these for many years but it looks like it was the losartan that was the one added to the other 2 in 2016. Annalea Alguire PA-C

## 2020-05-01 ENCOUNTER — Emergency Department (HOSPITAL_COMMUNITY)
Admission: EM | Admit: 2020-05-01 | Discharge: 2020-05-01 | Disposition: A | Discharge status: Home / Self Care | Payer: No Typology Code available for payment source | Attending: Emergency Medicine | Admitting: Emergency Medicine

## 2020-05-01 ENCOUNTER — Other Ambulatory Visit: Payer: Self-pay

## 2020-05-01 DIAGNOSIS — N183 Chronic kidney disease, stage 3 unspecified: Secondary | ICD-10-CM | POA: Insufficient documentation

## 2020-05-01 DIAGNOSIS — I2510 Atherosclerotic heart disease of native coronary artery without angina pectoris: Secondary | ICD-10-CM | POA: Insufficient documentation

## 2020-05-01 DIAGNOSIS — Z951 Presence of aortocoronary bypass graft: Secondary | ICD-10-CM | POA: Diagnosis not present

## 2020-05-01 DIAGNOSIS — I129 Hypertensive chronic kidney disease with stage 1 through stage 4 chronic kidney disease, or unspecified chronic kidney disease: Secondary | ICD-10-CM | POA: Diagnosis not present

## 2020-05-01 DIAGNOSIS — N4830 Priapism, unspecified: Secondary | ICD-10-CM | POA: Insufficient documentation

## 2020-05-01 DIAGNOSIS — Z87891 Personal history of nicotine dependence: Secondary | ICD-10-CM | POA: Diagnosis not present

## 2020-05-01 DIAGNOSIS — Z7982 Long term (current) use of aspirin: Secondary | ICD-10-CM | POA: Diagnosis not present

## 2020-05-01 MED ORDER — LIDOCAINE HCL 2 % IJ SOLN
5.0000 mL | Freq: Once | INTRAMUSCULAR | Status: DC
  Filled 2020-05-01: qty 20

## 2020-05-01 MED ORDER — PHENYLEPHRINE 200 MCG/ML FOR PRIAPISM / HYPOTENSION
200.0000 ug | Freq: Once | INTRAMUSCULAR | Status: DC
  Filled 2020-05-01: qty 50

## 2020-05-01 NOTE — ED Triage Notes (Signed)
Patient presents to the ER for penile pain and priapism. Patient reports he has been erect since 11am. Patient reports he had a penile shaft ultrasound. Patient reports it was supposed to have "gotten soft" by now but it has not. Patient reports he last urinated 45 minutes ago.

## 2020-05-01 NOTE — ED Provider Notes (Signed)
[facility] DEPT Provider Note   CSN: [id number] Arrival date & time: 05/01/20  1623     History Chief Complaint  Patient presents with  . Penis Pain    [name] is a 74 y.o. male.  HPI He presents for evaluation of priapism, which started after an injection, to induce erection, at 11 AM today.  This was done as a diagnostic maneuver to evaluate for erectile dysfunction.  Apparently a blood flow Doppler study was done, after the injection.  The patient called his urologist who advised that he take 2 Sudafed, which he did about an hour before coming here.  This did not result in detumescence.  He has a burning sensation in his penis but he denies pain.  He denies headache, chest pain, shortness of breath, weakness or dizziness.  There are no other known modifying factors.    Past Medical History:  Diagnosis Date  . Anal fissure   . Aortic insufficiency   . Arthritis   . Atrial flutter ([place])   . Bicuspid aortic valve    with mild to moderate AR by echo 08/2019  . BPH (benign prostatic hyperplasia)   . CAD (coronary artery disease)    s/p CABG [place]  . Cataract    bilateral-removed  . CKD (chronic kidney disease), stage III   . Depression   . Dilated aortic root ([place])   . History of blood transfusion 1998   "due to cardiac bypass surgery"  . Hyperlipidemia   . Hypertension   . Hypogonadism in male   . Kidney stones   . Migraine   . Mild carotid artery disease ([place]) 2009  . Myocardial infarction ([place])   . PVC's (premature ventricular contractions)   . Retinal hemorrhage 11/04/2014  . Sleep apnea with use of continuous positive airway pressure (CPAP)   . Thoracic aortic aneurysm (TAA) [facility])     Patient Active Problem List   Diagnosis Date Noted  . 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 Intermediate stage nonexudative age-related macular degeneration of both eyes 03/06/2020  . Pseudophakia 03/06/2020  .  Typical atrial flutter ([place]) 07/29/2019  . Secondary hypercoagulable state ([place]) 07/06/2019  . NSTEMI (non-ST elevated myocardial infarction) ([place]) 08/21/2018  . Migraine with aura and without status migrainosus, not intractable 10/14/2017  . Generalized anxiety disorder 10/10/2016  . OSA on CPAP 10/10/2016  . Bicuspid aortic valve   . 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 Aortic insufficiency 01/19/2015  . Ascending aortic aneurysm ([place]) 01/19/2015  . Retinal hemorrhage 11/04/2014  . Insomnia 11/04/2014  . HTN (hypertension) 09/21/2014  . Hyperlipidemia 09/21/2014  . CAD (coronary artery disease) 09/21/2014  . History of migraine 09/21/2014  . Hypogonadism in male 09/21/2014  . BPH (benign prostatic hyperplasia) 09/21/2014  . Anxiety and depression 09/21/2014    Past Surgical History:  Procedure Laterality Date  . A-FLUTTER ABLATION N/A 09/29/2019   Procedure: A-FLUTTER ABLATION;  Surgeon: Constance Haw, MD;  Location: [place] [facility] LAB;  Service: Cardiovascular;  Laterality: N/A;  . CARDIOVERSION N/A 08/10/2019   Procedure: CARDIOVERSION;  Surgeon: Elouise Munroe, MD;  Location: [facility] ENDOSCOPY;  Service: Cardiovascular;  Laterality: N/A;  . cataracts    . COLONOSCOPY    . CORONARY ARTERY BYPASS GRAFT  1998  . LEFT HEART CATH AND CORONARY ANGIOGRAPHY N/A 08/24/2018   Procedure: LEFT HEART CATH AND CORONARY ANGIOGRAPHY;  Surgeon: Lorretta Harp, MD;  Location: [place] CV LAB;  Service: Cardiovascular;  Laterality: N/A;  . TEE WITHOUT CARDIOVERSION N/A 01/19/2015   Procedure: TRANSESOPHAGEAL ECHOCARDIOGRAM (TEE);  Surgeon: Sueanne Margarita, MD;  Location: [place];  Service: Cardiovascular;  Laterality: N/A;  . [place]   pt denies       Family History  Problem Relation Age of Onset  . Arthritis Mother        deceased  . Hyperlipidemia Mother   . Diabetes Mother   . Arthritis Father   . Hyperlipidemia Father   . Heart disease Father  54  . Stroke Father 69       deceased  . Hypertension Father   . Diabetes Father   . Kidney disease Paternal Grandfather   . Colon cancer Neg Hx   . Colon polyps Neg Hx   . Esophageal cancer Neg Hx   . Stomach cancer Neg Hx   . Rectal cancer Neg Hx     Social History   Tobacco Use  . Smoking status: Former Smoker    Years: 20.00    Quit date: 07/22/1986    Years since quitting: 33.8  . Smokeless tobacco: Never Used  Vaping Use  . Vaping Use: Never used  Substance Use Topics  . Alcohol use: Yes    Alcohol/week: 1.0 standard drink    Types: 1 Glasses of wine per week    Comment: 0-1 daily  . Drug use: No    Home Medications Prior to Admission medications   Medication Sig Start Date End Date Taking? Authorizing Provider  butalbital-aspirin-caffeine-codeine (FIORINAL WITH CODEINE) 50-325-40-30 MG capsule TAKE ONE CAPSULE BY MOUTH EVERY 4 HOURS AS NEEDED FOR MIGRAINE 03/01/20   [name], NP  acetaminophen (TYLENOL) 325 MG tablet Take 650 mg by mouth every 6 (six) hours as needed for mild pain or moderate pain.    [provider]  ALPRAZolam (ALPRAZOLAM XR) 1 MG 24 hr tablet Take 1 tablet (1 mg total) by mouth daily. 11/16/19   [name], NP  amLODipine (NORVASC) 10 MG tablet Take 1 tablet (10 mg total) by mouth daily. 11/12/19   Sueanne Margarita, MD  amoxicillin (AMOXIL) 500 MG capsule Take 4 tablets, 2 grams, 1 hour before each of your three dental procedures. 02/01/19   Sueanne Margarita, MD  Ascorbic Acid (VITAMIN C) 1000 MG tablet Take 1,000 mg by mouth 2 (two) times daily.    [provider]  aspirin EC 81 MG tablet Take 1 tablet (81 mg total) by mouth daily. 11/01/19   Camnitz, Will Hassell Done, MD  aspirin-acetaminophen-caffeine (EXCEDRIN EXTRA STRENGTH) [phone] MG tablet Take 2 tablets by mouth daily as needed for headache.     [provider]  diazepam (VALIUM) 5 MG tablet Take 2.5-5 mg by mouth 2 (two) times daily as needed for anxiety.   10/05/14   [provider]  ibuprofen (ADVIL,MOTRIN) 200 MG tablet Take 200-400 mg by mouth 2 (two) times daily as needed (for arthritis in the feet).     [provider]  losartan (COZAAR) 25 MG tablet Take 1 tablet (25 mg total) by mouth daily. 11/12/19   Sueanne Margarita, MD  metoprolol succinate (TOPROL-XL) 100 MG 24 hr tablet Take 1 tablet (100 mg total) by mouth daily. Take with or immediately following a meal. 11/02/19   Camnitz, Ocie Doyne, MD  metoprolol tartrate (LOPRESSOR) 25 MG tablet Take 1 tablet (25 mg total) by mouth every 6 (six) hours as needed (Heart Rate >100). 07/06/19  07/05/20  Fenton, Clint R, PA  niacin 500 MG tablet Take 500 mg by mouth at bedtime.    [provider]  rosuvastatin (CRESTOR) 20 MG tablet TAKE ONE TABLET BY MOUTH DAILY 08/30/19   Isaiah Serge, NP  Specialty Vitamins Products (CENTRUM SPECIALIST ENERGY) TABS Take 1 tablet by mouth daily with breakfast.    [provider]  tadalafil (CIALIS) 5 MG tablet Take 5 mg by mouth daily. 11/02/19   [provider]  Testosterone (ANDROGEL) 40.5 MG/2.5GM (1.62%) GEL Place 1 application onto the skin daily. Apply 1 pump to each shoulder every morning    [provider]  Turmeric 500 MG CAPS Take 1,000 mg by mouth daily.     [provider]  vitamin E 400 UNIT capsule Take 400 Units by mouth at bedtime.     [provider]  zonisamide (ZONEGRAN) 25 MG capsule TAKE TWO CAPSULES BY MOUTH DAILY 02/29/20   [name], NP    Allergies    Dog epithelium allergy skin test and Mushroom extract complex  Review of Systems   Review of Systems  All other systems reviewed and are negative.   Physical Exam Updated Vital Signs BP 125/67   Pulse 68   Temp 98.3 F (36.8 C) (Oral)   Resp 12   Ht 5\' 7"  (1.702 m)   Wt 68 kg   SpO2 98%   BMI 23.49 kg/m   Physical Exam Vitals and nursing note reviewed.  Constitutional:      General: He is not in acute  distress.    Appearance: He is well-developed. He is not ill-appearing, toxic-appearing or diaphoretic.  HENT:     Head: Normocephalic and atraumatic.     Right Ear: External ear normal.     Left Ear: External ear normal.  Eyes:     Conjunctiva/sclera: Conjunctivae normal.     Pupils: Pupils are equal, round, and reactive to light.  Neck:     Trachea: Phonation normal.  Cardiovascular:     Rate and Rhythm: Normal rate.  Pulmonary:     Effort: Pulmonary effort is normal.  Abdominal:     General: There is no distension.     Palpations: Abdomen is soft.     Tenderness: There is no abdominal tenderness.  Genitourinary:    Comments: Full firm erection.  Normal scrotum and scrotal contents.  No inguinal mass or tenderness. Musculoskeletal:        General: Normal range of motion.     Cervical back: Normal range of motion and neck supple.  Skin:    General: Skin is warm and dry.  Neurological:     Mental Status: He is alert and oriented to person, place, and time.     Cranial Nerves: No cranial nerve deficit.     Sensory: No sensory deficit.     Motor: No abnormal muscle tone.     Coordination: Coordination normal.  Psychiatric:        Behavior: Behavior normal.        Thought Content: Thought content normal.        Judgment: Judgment normal.     ED Results / Procedures / Treatments   Labs (all labs ordered are listed, but only abnormal results are displayed) Labs Reviewed - No data to display  EKG None  Radiology No results found.  Procedures Procedures (including critical care time)  Medications Ordered in ED Medications  phenylephrine 200 mcg / ml CONC. DILUTION INJ (ED /  Urology USE ONLY) (has no administration in time range)  lidocaine (XYLOCAINE) 2 % (with pres) injection 100 mg (has no administration in time range)    ED Course  I have reviewed the triage vital signs and the nursing notes.  Pertinent labs & imaging results that were available during my  care of the patient were reviewed by me and considered in my medical decision making (see chart for details).  Clinical Course as of May 02 1951  Mon May 01, 2020  1847 At this time he has had spontaneous detumescence, by at least 50%.   [EW]    Clinical Course User Index [EW] Daleen Bo, MD   MDM Rules/Calculators/A&P                           Patient Vitals for the past 24 hrs:  BP Temp Temp src Pulse Resp SpO2 Height Weight  05/01/20 1943 125/67 -- -- 68 12 98 % -- --  05/01/20 1734 125/69 98.3 F (36.8 C) Oral 66 17 100 % -- --  05/01/20 1659 136/70 98.3 F (36.8 C) Oral 71 15 100 % -- --  05/01/20 1628 115/76 98.3 F (36.8 C) Oral 78 14 98 % 5\' 7"  (1.702 m) 68 kg    7:51 PM Reevaluation with update and discussion. After initial assessment and treatment, an updated evaluation reveals he states his erection has resolved and he is ready to go home.Daleen Bo   Medical Decision Making:  This patient is presenting for evaluation of priapism, which does require a range of treatment options, and is a complaint that involves a moderate risk of morbidity and mortality. The differential diagnoses include chemical priapism, prolonged erection. I decided to review old records, and in summary elderly male being treated for erectile dysfunction, with a injection to promote erection today.  I did not require additional historical information from anyone.   Critical Interventions-clinical evaluation, observation, resolution of erection without intervention  After These Interventions, the Patient was reevaluated and was found stable for discharge.  He took Sudafed prior to arrival which likely caused detumescence.  CRITICAL CARE-no Performed by: Daleen Bo  Nursing Notes Reviewed/ Care Coordinated Applicable Imaging Reviewed Interpretation of Laboratory Data incorporated into ED treatment  The patient appears reasonably screened and/or stabilized for discharge and I doubt  any other medical condition or other [facility] requiring further screening, evaluation, or treatment in the ED at this time prior to discharge.  Plan: Home Medications-continue usual; Home Treatments-regular activity; return here if the recommended treatment, does not improve the symptoms; Recommended follow up-PCP, as needed     Final Clinical Impression(s) / ED Diagnoses Final diagnoses:  Priapism    Rx / DC Orders ED Discharge Orders    None       Daleen Bo, MD 05/01/20 1952

## 2020-05-01 NOTE — Discharge Instructions (Addendum)
It appears that the Sudafed you took was helpful for your problem.  It is not likely to return.  Contact your urologist as needed for problems.

## 2020-05-18 ENCOUNTER — Other Ambulatory Visit: Payer: Self-pay | Admitting: Adult Health

## 2020-05-18 DIAGNOSIS — F5104 Psychophysiologic insomnia: Secondary | ICD-10-CM

## 2020-06-28 DIAGNOSIS — Z0289 Encounter for other administrative examinations: Secondary | ICD-10-CM

## 2020-07-03 ENCOUNTER — Ambulatory Visit (INDEPENDENT_AMBULATORY_CARE_PROVIDER_SITE_OTHER): Payer: Medicare Other | Admitting: Adult Health

## 2020-07-03 ENCOUNTER — Encounter: Payer: Self-pay | Admitting: Adult Health

## 2020-07-03 VITALS — BP 131/68 | HR 65 | Ht 67.0 in | Wt 155.0 lb

## 2020-07-03 DIAGNOSIS — Z9989 Dependence on other enabling machines and devices: Secondary | ICD-10-CM | POA: Diagnosis not present

## 2020-07-03 DIAGNOSIS — G43109 Migraine with aura, not intractable, without status migrainosus: Secondary | ICD-10-CM | POA: Diagnosis not present

## 2020-07-03 DIAGNOSIS — I2510 Atherosclerotic heart disease of native coronary artery without angina pectoris: Secondary | ICD-10-CM

## 2020-07-03 DIAGNOSIS — G4733 Obstructive sleep apnea (adult) (pediatric): Secondary | ICD-10-CM

## 2020-07-03 DIAGNOSIS — F5104 Psychophysiologic insomnia: Secondary | ICD-10-CM

## 2020-07-03 NOTE — Patient Instructions (Signed)
Your Plan:  Continue CPAP- will review sleep study for inspire device Continue zonegran and Fioricet  Continue xanax XR for sleep If your symptoms worsen or you develop new symptoms please let us know.   Thank you for coming to see [place] at [facility]. I hope we have been able to provide you high quality care today.  You may receive a patient satisfaction survey over the next few weeks. We would appreciate your feedback and comments so that we may continue to improve ourselves and the health of our patients.

## 2020-07-03 NOTE — Progress Notes (Signed)
PATIENT: [name] DOB: 1946-07-05  REASON FOR VISIT: follow up HISTORY FROM: patient  HISTORY OF PRESENT ILLNESS: Today 07/03/20: [name] is a 74 year old male with a history of obstructive sleep apnea on CPAP, migraine headaches and insomnia.  CPAP: CPAP download indicates that he uses machine nightly for compliance of 100%.  He uses machine greater than 4 hours 29 days for compliance of 97%.  On average he uses  his machine 7 hours and 31 minutes.  His residual AHI is 2.2 on 10 cm of water with EPR 3.  Leak in the 95th percentile is 19.8 L/min.  He went to a seminar about the inspire device with Dr. Wilburn Cornelia.  He is interested in trying this device if he qualifies.  Insomnia he continues to use Xanax XR 1 mg at bedtime.  Reports this continues to work well for him.  He states in the future he feels that those may have to be increased.  Migraines: His headaches continue to be controlled with Zonegran 50 mg daily.  He continues to use Fioricet for acute treatment of his headaches.   07/01/19: [name] is a 74 year old male with a history of obstructive sleep apnea on CPAP, migraine headaches and insomnia.  He returns today for follow-up.  His download indicates that he uses machine 30 out of 30 days for compliance of 100%.  He uses machine greater than 4 hours each night.  On average he uses his machine 7 hours and 55 minutes.  His residual AHI is 2.3 on 10 cm of water with EPR 3.  His leak in the 95th percentile is 17.7 L/min.  He reports that the CPAP continues to work well for him.  He reports that sometimes he has trouble getting a good seal with the mask most likely due to his beard.  He reports that his headaches are under good control with Zonegran and Fioricet.  He reports that he typically can take 2 tablets of Fioricet and his headache resolves fairly quickly.  He continues to use Xanax extended release at bedtime to help with insomnia.  Reports that this continues  to work well.  He returns today for an evaluation.   HISTORY [name] is a 74 year old male with a history of obstructive sleep apnea on CPAP, migraine headaches and insomnia.  He joins me today for a virtual visit.  His download indicates that he uses machine nightly for compliance of 100%.  He uses machine greater than 4 hours 29 out of 30 days for compliance of 97%.  On average he uses the machine 7 hours and 2 minutes.  His residual AHI is 2.4 on 10 cm of water with EPR of 3.  He has a leak in the 95th percentile at 24.2 L/min.  He states that the leak is from his beard.  Reports that due to COVID-19 he was unable to get his beard trimmed. he continues to take Xanax extended release at bedtime for insomnia.  He continues on Zonegran and Fioricet for his migraine.  Reports that he has approximately 1 headache a week.  His headaches tend to respond well to Fioricet.  He states that he typically takes Excedrin Migraine first.  His headache will typically resolve in 45 minutes if it does not he will then take Fioricet and the headache will resolve in 20 to 25 minutes.  REVIEW OF SYSTEMS: Out of a complete 14 system review of symptoms, the patient complains only of the following  symptoms, and all other reviewed systems are negative.  ESS 2  ALLERGIES: Allergies  Allergen Reactions  . Dog Epithelium Allergy Skin Test Itching, Anxiety, Palpitations and Other (See Comments)  . Mushroom Extract Complex Nausea Only and Other (See Comments)    Severe vertigo and headaches    HOME MEDICATIONS: Outpatient Medications Prior to Visit  Medication Sig Dispense Refill  . acetaminophen (TYLENOL) 325 MG tablet Take 650 mg by mouth every 6 (six) hours as needed for mild pain or moderate pain.    Marland Kitchen ALPRAZOLAM XR 1 MG 24 hr tablet TAKE ONE TABLET BY MOUTH DAILY 90 tablet 1  . amLODipine (NORVASC) 10 MG tablet Take 1 tablet (10 mg total) by mouth daily. 90 tablet 3  . amoxicillin (AMOXIL) 500 MG capsule Take 4  tablets, 2 grams, 1 hour before each of your three dental procedures. 12 capsule 0  . Ascorbic Acid (VITAMIN C) 1000 MG tablet Take 1,000 mg by mouth 2 (two) times daily.    Marland Kitchen aspirin EC 81 MG tablet Take 1 tablet (81 mg total) by mouth daily. 90 tablet 3  . aspirin-acetaminophen-caffeine (EXCEDRIN MIGRAINE) 250-250-65 MG tablet Take 2 tablets by mouth daily as needed for headache.     . butalbital-aspirin-caffeine-codeine (FIORINAL WITH CODEINE) 50-325-40-30 MG capsule TAKE ONE CAPSULE BY MOUTH EVERY 4 HOURS AS NEEDED FOR MIGRAINE 30 capsule 5  . diazepam (VALIUM) 5 MG tablet Take 2.5-5 mg by mouth 2 (two) times daily as needed for anxiety.     Marland Kitchen ibuprofen (ADVIL,MOTRIN) 200 MG tablet Take 200-400 mg by mouth 2 (two) times daily as needed (for arthritis in the feet).     Marland Kitchen losartan (COZAAR) 25 MG tablet Take 1 tablet (25 mg total) by mouth daily. 90 tablet 3  . metoprolol succinate (TOPROL-XL) 100 MG 24 hr tablet Take 1 tablet (100 mg total) by mouth daily. Take with or immediately following a meal. 90 tablet 2  . niacin 500 MG tablet Take 500 mg by mouth at bedtime.    . rosuvastatin (CRESTOR) 20 MG tablet TAKE ONE TABLET BY MOUTH DAILY 90 tablet 2  . Specialty Vitamins Products (CENTRUM SPECIALIST ENERGY) TABS Take 1 tablet by mouth daily with breakfast.    . tadalafil (CIALIS) 5 MG tablet Take 5 mg by mouth daily.    . Testosterone 40.5 MG/2.5GM (1.62%) GEL Place 1 application onto the skin daily. Apply 2 pumps to each shoulder every morning    . Turmeric 500 MG CAPS Take 1,000 mg by mouth daily.     . vitamin E 400 UNIT capsule Take 400 Units by mouth at bedtime.     Marland Kitchen zonisamide (ZONEGRAN) 25 MG capsule TAKE TWO CAPSULES BY MOUTH DAILY 180 capsule 1  . metoprolol tartrate (LOPRESSOR) 25 MG tablet Take 1 tablet (25 mg total) by mouth every 6 (six) hours as needed (Heart Rate >100). 60 tablet 2   Facility-Administered Medications Prior to Visit  Medication Dose Route Frequency Provider Last  Rate Last Admin  . 0.9 %  sodium chloride infusion  500 mL Intravenous Once [place], MD      . 0.9 %  sodium chloride infusion  500 mL Intravenous Once Armbruster, Carlota Raspberry, MD        PAST MEDICAL HISTORY: Past Medical History:  Diagnosis Date  . Anal fissure   . Aortic insufficiency   . Arthritis   . Atrial flutter ([place])   . Bicuspid aortic valve    with mild to  moderate AR by echo 08/2019  . BPH (benign prostatic hyperplasia)   . CAD (coronary artery disease)    s/p CABG [place]  . Cataract    bilateral-removed  . CKD (chronic kidney disease), stage III ([place])   . Depression   . Dilated aortic root ([place])   . History of blood transfusion 1998   "due to cardiac bypass surgery"  . Hyperlipidemia   . Hypertension   . Hypogonadism in male   . Kidney stones   . Migraine   . Mild carotid artery disease ([place]) 2009  . Myocardial infarction ([place])   . PVC's (premature ventricular contractions)   . Retinal hemorrhage 11/04/2014  . Sleep apnea with use of continuous positive airway pressure (CPAP)   . Thoracic aortic aneurysm (TAA) ([place])     PAST SURGICAL HISTORY: Past Surgical History:  Procedure Laterality Date  . A-FLUTTER ABLATION N/A 09/29/2019   Procedure: A-FLUTTER ABLATION;  Surgeon: Constance Haw, MD;  Location: [facility];  Service: Cardiovascular;  Laterality: N/A;  . CARDIOVERSION N/A 08/10/2019   Procedure: CARDIOVERSION;  Surgeon: Elouise Munroe, MD;  [facility] END[facility];  Service: Cardiovascular;  Laterality: N/A;  . cataracts    . COLONOSCOPY    . CORONARY ARTERY BYPASS GRAFT  1998  . LEFT HEART CATH AND CORONARY ANGIOGRAPHY N/A 08/24/2018   Procedure: LEFT HEART CATH AND CORONARY ANGIOGRAPHY;  Surgeon: Lorretta Harp, MD;  Location: [facility];  Service: Cardiovascular;  Laterality: N/A;  . TEE WITHOUT CARDIOVERSION N/A 01/19/2015   Procedure: TRANSESOPHAGEAL ECHOCARDIOGRAM (TEE);  Surgeon: Sueanne Margarita, MD;   Location: [facility];  Service: Cardiovascular;  Laterality: N/A;  . [place]   pt denies    FAMILY HISTORY: Family History  Problem Relation Age of Onset  . Arthritis Mother        deceased  . Hyperlipidemia Mother   . Diabetes Mother   . Arthritis Father   . Hyperlipidemia Father   . Heart disease Father 86  . Stroke Father 24       deceased  . Hypertension Father   . Diabetes Father   . Kidney disease Paternal Grandfather   . Colon cancer Neg Hx   . Colon polyps Neg Hx   . Esophageal cancer Neg Hx   . Stomach cancer Neg Hx   . Rectal cancer Neg Hx     SOCIAL HISTORY: Social History   Socioeconomic History  . Marital status: Divorced    Spouse name: Not on file  . Number of children: 0  . Years of education: Not on file  . Highest education level: Not on file  Occupational History  . Occupation: retired  Tobacco Use  . Smoking status: Former Smoker    Years: 20.00    Quit date: 07/22/1986    Years since quitting: 33.9  . Smokeless tobacco: Never Used  Vaping Use  . Vaping Use: Never used  Substance and Sexual Activity  . Alcohol use: Yes    Alcohol/week: 1.0 standard drink    Types: 1 Glasses of wine per week    Comment: 0-1 daily  . Drug use: No  . Sexual activity: Not on file  Other Topics Concern  . Not on file  Social History Narrative   Divorced   Secondary school teacher- retired   No children   Has a Neurosurgeon named [name]   Enjoys outdoor activities- hiking, swimming, Control and instrumentation engineer, baseball, writing, reading, cooking  Social Determinants of Health   Financial Resource Strain: Not on file  Food Insecurity: Not on file  Transportation Needs: Not on file  Physical Activity: Not on file  Stress: Not on file  Social Connections: Not on file  Intimate Partner Violence: Not on file      PHYSICAL EXAM  Vitals:   07/03/20 1027  BP: 131/68  Pulse: 65  Weight: 155 lb (70.3 kg)  Height: 5\' 7"  (1.702 m)   Body mass index is 24.28  kg/m.  Generalized: Well developed, in no acute distress   Neurological examination  Mentation: Alert oriented to time, place, history taking. Follows all commands speech and language fluent Cranial nerve II-XII: Pupils were equal round reactive to light. Extraocular movements were full, visual field were full on confrontational test. Head turning and shoulder shrug  were normal and symmetric. Motor: The motor testing reveals 5 over 5 strength of all 4 extremities. Good symmetric motor tone is noted throughout.  Sensory: Sensory testing is intact to soft touch on all 4 extremities. No evidence of extinction is noted.  Coordination: Cerebellar testing reveals good finger-nose-finger and heel-to-shin bilaterally.  Gait and station: Gait is normal.    DIAGNOSTIC DATA (LABS, IMAGING, TESTING) - I reviewed patient records, labs, notes, testing and imaging myself where available.  Lab Results  Component Value Date   WBC 9.0 09/29/2019   HGB 13.6 09/29/2019   HCT 40.0 09/29/2019   MCV 104.9 (H) 09/29/2019   PLT 229 09/29/2019      Component Value Date/Time   NA 141 01/25/2020 0850   K 4.8 01/25/2020 0850   CL 105 01/25/2020 0850   CO2 22 01/25/2020 0850   GLUCOSE 115 (H) 01/25/2020 0850   GLUCOSE 105 (H) 09/29/2019 1144   BUN 17 01/25/2020 0850   CREATININE 1.52 (H) 01/25/2020 0850   CALCIUM 9.0 01/25/2020 0850   PROT 6.3 01/25/2020 0850   ALBUMIN 4.3 01/25/2020 0850   AST 32 01/25/2020 0850   ALT 26 01/25/2020 0850   ALKPHOS 104 01/25/2020 0850   BILITOT 0.3 01/25/2020 0850   GFRNONAA 45 (L) 01/25/2020 0850   GFRAA 53 (L) 01/25/2020 0850   Lab Results  Component Value Date   CHOL 76 (L) 06/22/2019   HDL 40 06/22/2019   LDLCALC 19 06/22/2019   TRIG 79 06/22/2019   CHOLHDL 1.9 06/22/2019   Lab Results  Component Value Date   HGBA1C 5.9 (H) 08/22/2018   Lab Results  Component Value Date   [id number] 010 09/24/2018   Lab Results  Component Value Date   TSH 1.537  07/29/2019      ASSESSMENT AND PLAN 74 y.o. year old male  has a past medical history of Anal fissure, Aortic insufficiency, Arthritis, Atrial flutter ([place]), Bicuspid aortic valve, BPH (benign prostatic hyperplasia), CAD (coronary artery disease), Cataract, CKD (chronic kidney disease), stage III ([place]), Depression, Dilated aortic root ([place]), History of blood transfusion (1998), Hyperlipidemia, Hypertension, Hypogonadism in male, Kidney stones, Migraine, Mild carotid artery disease ([place]) (2009), Myocardial infarction ([place]), PVC's (premature ventricular contractions), Retinal hemorrhage (11/04/2014), Sleep apnea with use of continuous positive airway pressure (CPAP), and Thoracic aortic aneurysm (TAA) ([place]). here with:  1.  Obstructive sleep apnea on CPAP  -Download shows good compliance -We will review sleep study to see if he is a candidate for inspire device -Encouraged to continue using CPAP nightly and greater than 4 hours each night  2.  Migraine headaches  -Continue Zonegran 50 mg daily. -Continue Fioricet  as an abortive therapy  3.  Insomnia  -Continue Xanax XR 1 mg at bedtime  Overall the patient is doing well.  He is advised that if her symptoms worsen or he develops new symptoms he should let us know.  He will follow-up in 1 year or sooner if needed.  I spent 30 minutes of face-to-face and non-face-to-face time with patient.  This included previsit chart review, lab review, study review, order entry, electronic health record documentation, patient education.     [name], MSN, NP-C 07/03/2020, 10:37 AM [facility] [address] [place],  [address] [phone]

## 2020-07-25 ENCOUNTER — Encounter: Payer: Self-pay | Admitting: Adult Health

## 2020-07-27 ENCOUNTER — Ambulatory Visit (INDEPENDENT_AMBULATORY_CARE_PROVIDER_SITE_OTHER): Payer: Medicare Other | Admitting: Cardiology

## 2020-07-27 ENCOUNTER — Other Ambulatory Visit: Payer: Self-pay

## 2020-07-27 ENCOUNTER — Encounter: Payer: Self-pay | Admitting: Cardiology

## 2020-07-27 VITALS — BP 130/66 | HR 46 | Ht 67.0 in | Wt 153.8 lb

## 2020-07-27 DIAGNOSIS — I7121 Aneurysm of the ascending aorta, without rupture: Secondary | ICD-10-CM

## 2020-07-27 DIAGNOSIS — I483 Typical atrial flutter: Secondary | ICD-10-CM | POA: Diagnosis not present

## 2020-07-27 DIAGNOSIS — E7800 Pure hypercholesterolemia, unspecified: Secondary | ICD-10-CM

## 2020-07-27 DIAGNOSIS — I2510 Atherosclerotic heart disease of native coronary artery without angina pectoris: Secondary | ICD-10-CM

## 2020-07-27 DIAGNOSIS — I712 Thoracic aortic aneurysm, without rupture: Secondary | ICD-10-CM | POA: Diagnosis not present

## 2020-07-27 DIAGNOSIS — Q231 Congenital insufficiency of aortic valve: Secondary | ICD-10-CM | POA: Diagnosis not present

## 2020-07-27 DIAGNOSIS — I10 Essential (primary) hypertension: Secondary | ICD-10-CM

## 2020-07-27 LAB — COMPREHENSIVE METABOLIC PANEL
ALT: 25 IU/L (ref 0–44)
AST: 33 IU/L (ref 0–40)
Albumin/Globulin Ratio: 2.1 (ref 1.2–2.2)
Albumin: 4.1 g/dL (ref 3.7–4.7)
Alkaline Phosphatase: 152 IU/L — ABNORMAL HIGH (ref 44–121)
BUN/Creatinine Ratio: 9 — ABNORMAL LOW (ref 10–24)
BUN: 12 mg/dL (ref 8–27)
Bilirubin Total: 0.4 mg/dL (ref 0.0–1.2)
CO2: 22 mmol/L (ref 20–29)
Calcium: 8.5 mg/dL — ABNORMAL LOW (ref 8.6–10.2)
Chloride: 106 mmol/L (ref 96–106)
Creatinine, Ser: 1.28 mg/dL — ABNORMAL HIGH (ref 0.76–1.27)
GFR calc Af Amer: 64 mL/min/{1.73_m2} (ref >59)
GFR calc non Af Amer: 56 mL/min/{1.73_m2} — ABNORMAL LOW (ref >59)
Globulin, Total: 2 g/dL (ref 1.5–4.5)
Glucose: 109 mg/dL — ABNORMAL HIGH (ref 65–99)
Potassium: 4.6 mmol/L (ref 3.5–5.2)
Sodium: 142 mmol/L (ref 134–144)
Total Protein: 6.1 g/dL (ref 6.0–8.5)

## 2020-07-27 LAB — LIPID PANEL
Chol/HDL Ratio: 1.8 ratio (ref 0.0–5.0)
Cholesterol, Total: 73 mg/dL — ABNORMAL LOW (ref 100–199)
HDL: 40 mg/dL (ref >39)
LDL Chol Calc (NIH): 19 mg/dL (ref 0–99)
Triglycerides: 59 mg/dL (ref 0–149)
VLDL Cholesterol Cal: 14 mg/dL (ref 5–40)

## 2020-07-27 NOTE — Patient Instructions (Signed)
Medication Instructions:  Your physician recommends that you continue on your current medications as directed. Please refer to the Current Medication list given to you today.  *If you need a refill on your cardiac medications before your next appointment, please call your pharmacy*   Lab Work: TODAY: CMET and Fasting Lipids  If you have labs (blood work) drawn today and your tests are completely normal, you will receive your results only by: Marland Kitchen MyChart Message (if you have MyChart) OR . A paper copy in the mail If you have any lab test that is abnormal or we need to change your treatment, we will call you to review the results.  Follow-Up: At [facility], you and your health needs are our priority.  As part of our continuing mission to provide you with exceptional heart care, we have created designated Provider Care Teams.  These Care Teams include your primary Cardiologist (physician) and Advanced Practice Providers (APPs -  Physician Assistants and Nurse Practitioners) who all work together to provide you with the care you need, when you need it.  Your next appointment:   6 month(s)  The format for your next appointment:   In Person  Provider:   You may see Armanda Magic, MD or one of the following Advanced Practice Providers on your designated Care Team:    Ronie Spies, PA-C  Jacolyn Reedy, PA-C

## 2020-07-27 NOTE — Progress Notes (Signed)
Cardiology Office Note    Date:  07/27/2020   ID:  [name], DOB [birth year], MRN [id number]  PCP:  Burman Freestone, MD  Cardiologist:  Fransico Him, MD  Electrophysiologist:  None   Chief Complaint: CAD, atrial flutter  History of Present Illness:   [name] is a 75 y.o. male with history of CAD s/p CABG 1998, bicuspid aortic valve with aortic insufficiency, dilated ascending aorta and aortic root (4.7cm TAA on CT chest 05/2019), hyperlipidemia, hypertension, OSA (followed by neurology), mild carotid artery disease (by duplex 2009), CKD stage III reportedly due to hypertensive nephrosclerosis, atrial flutter, PVCs, hypogonadism, retinal hemorrhage who presents for follow-up.   [place]. He has a known bicuspid AV and dilated aortic root followed at [facility]. He was admitted to [facility] in early 2020 with NSTEMI. Cath 08/2018 showed an occluded left radial graft to the RCA with patent LIMA-LAD with L-R collaterals. Medical therapy was recommended.   He was having increased palpitations December 2020 with 30-day monitor showing episodes of atrial flutter in the 120s. He was started on anticoagulation. Repeat echo 08/2019 showed EF 55-60%, mild asymmetric LVH, normal PASP, mild MR, mild-moderate AI, ascending aortic aneurysm of 30mm with aneurysm of aortic root measuring 58mm. He was seen by atrial fib clinic and placed on amiodarone and underwent cardioversion. He subsequently saw Dr. Curt Bears and underwent ablation of his atrial flutter in 09/2019.  CT chest in 05/2019 also demonstrated nonspecific interstitial lung disease.   He is here today for followup and is doing well.  he denies any chest pain or pressure, SOB, DOE, PND, orthopnea, LE edema, dizziness, palpitations or syncope. He is compliant with his meds and is tolerating meds with no SE.    Past Medical History:  Diagnosis Date  . Anal fissure   . Aortic insufficiency   . Arthritis    . Atrial flutter ([place])   . Bicuspid aortic valve    with mild to moderate AR by echo 08/2019  . BPH (benign prostatic hyperplasia)   . CAD (coronary artery disease)    s/p CABG [place]  . Cataract    bilateral-removed  . CKD (chronic kidney disease), stage III ([place])   . Depression   . Dilated aortic root ([place])   . History of blood transfusion 1998   "due to cardiac bypass surgery"  . Hyperlipidemia   . Hypertension   . Hypogonadism in male   . Kidney stones   . Migraine   . Mild carotid artery disease ([place]) 2009  . Myocardial infarction ([place])   . PVC's (premature ventricular contractions)   . Retinal hemorrhage 11/04/2014  . Sleep apnea with use of continuous positive airway pressure (CPAP)   . Thoracic aortic aneurysm (TAA) ([place])     Past Surgical History:  Procedure Laterality Date  . A-FLUTTER ABLATION N/A 09/29/2019   Procedure: A-FLUTTER ABLATION;  Surgeon: Constance Haw, MD;  Location: [facility];  Service: Cardiovascular;  Laterality: N/A;  . CARDIOVERSION N/A 08/10/2019   Procedure: CARDIOVERSION;  Surgeon: Elouise Munroe, MD;  Location: [facility] ENDOSCOPY;  Service: Cardiovascular;  Laterality: N/A;  . cataracts    . COLONOSCOPY    . CORONARY ARTERY BYPASS GRAFT  1998  . LEFT HEART CATH AND CORONARY ANGIOGRAPHY N/A 08/24/2018   Procedure: LEFT HEART CATH AND CORONARY ANGIOGRAPHY;  Surgeon: Lorretta Harp, MD;  Location: [facility];  Service: Cardiovascular;  Laterality: N/A;  . TEE WITHOUT CARDIOVERSION N/A 01/19/2015   Procedure: TRANSESOPHAGEAL ECHOCARDIOGRAM (TEE);  Surgeon: Quintella Reichert, MD;  Location: [facility] ENDOSCOPY;  Service: Cardiovascular;  Laterality: N/A;  . VARICOSE VEIN SURGERY  1984   pt denies    Current Medications: Current Meds  Medication Sig  . acetaminophen (TYLENOL) 325 MG tablet Take 650 mg by mouth every 6 (six) hours as needed for mild pain or moderate pain.  Marland Kitchen ALPRAZolam (ALPRAZOLAM XR) 1 MG 24 hr tablet  Take 1 tablet (1 mg total) by mouth daily.  Marland Kitchen amLODipine (NORVASC) 10 MG tablet Take 1 tablet (10 mg total) by mouth daily.  Marland Kitchen amoxicillin (AMOXIL) 500 MG capsule Take 4 tablets, 2 grams, 1 hour before each of your three dental procedures.  . Ascorbic Acid (VITAMIN C) 1000 MG tablet Take 1,000 mg by mouth 2 (two) times daily.  Marland Kitchen aspirin EC 81 MG tablet Take 1 tablet (81 mg total) by mouth daily.  Marland Kitchen aspirin-acetaminophen-caffeine (EXCEDRIN EXTRA STRENGTH) 250-250-65 MG tablet Take 2 tablets by mouth daily as needed for headache.   . butalbital-aspirin-caffeine-codeine (FIORINAL WITH CODEINE) 50-325-40-30 MG capsule TAKE ONE CAPSULE BY MOUTH EVERY 4 HOURS AS NEEDED FOR MIGRAINE  . diazepam (VALIUM) 5 MG tablet Take 2.5-5 mg by mouth 2 (two) times daily as needed for anxiety.   Marland Kitchen ibuprofen (ADVIL,MOTRIN) 200 MG tablet Take 200-400 mg by mouth 2 (two) times daily as needed (for arthritis in the feet).   Marland Kitchen losartan (COZAAR) 25 MG tablet Take 1 tablet (25 mg total) by mouth daily.  . metoprolol succinate (TOPROL-XL) 100 MG 24 hr tablet Take 1 tablet (100 mg total) by mouth daily. Take with or immediately following a meal.  . metoprolol tartrate (LOPRESSOR) 25 MG tablet Take 1 tablet (25 mg total) by mouth every 6 (six) hours as needed (Heart Rate >100).  . niacin 500 MG tablet Take 500 mg by mouth at bedtime.  . rosuvastatin (CRESTOR) 20 MG tablet TAKE ONE TABLET BY MOUTH DAILY  . Specialty Vitamins Products (CENTRUM SPECIALIST ENERGY) TABS Take 1 tablet by mouth daily with breakfast.  . tadalafil (CIALIS) 5 MG tablet Take 5 mg by mouth daily.  . Testosterone (ANDROGEL) 40.5 MG/2.5GM (1.62%) GEL Place 1 application onto the skin daily. Apply 1 pump to each shoulder every morning  . Turmeric 500 MG CAPS Take 1,000 mg by mouth daily.   . vitamin E 400 UNIT capsule Take 400 Units by mouth at bedtime.   Marland Kitchen zonisamide (ZONEGRAN) 25 MG capsule TAKE TWO CAPSULES BY MOUTH DAILY    Allergies:   Dog epithelium  allergy skin test and Mushroom extract complex   Social History   Socioeconomic History  . Marital status: Divorced    Spouse name: Not on file  . Number of children: 0  . Years of education: Not on file  . Highest education level: Not on file  Occupational History  . Occupation: retired  Tobacco Use  . Smoking status: Former Smoker    Years: 20.00    Quit date: 07/22/1986    Years since quitting: 34.0  . Smokeless tobacco: Never Used  Vaping Use  . Vaping Use: Never used  Substance and Sexual Activity  . Alcohol use: Yes    Alcohol/week: 1.0 standard drink    Types: 1 Glasses of wine per week    Comment: 0-1 daily  . Drug use: No  . Sexual activity: Not on file  Other Topics Concern  . Not  on file  Social History Narrative   Divorced   Secondary school teacher- retired   No children   Has a Neurosurgeon named [name]   Enjoys outdoor activities- hiking, swimming, Control and instrumentation engineer, baseball, writing, reading, cooking   Social Determinants of Radio broadcast assistant Strain: Not on [employer] Insecurity: Not on file  Transportation Needs: Not on file  Physical Activity: Not on file  Stress: Not on file  Social Connections: Not on file     Family History:  The patient's family history includes Arthritis in his father and mother; Diabetes in his father and mother; Heart disease (age of onset: 21) in his father; Hyperlipidemia in his father and mother; Hypertension in his father; Kidney disease in his paternal grandfather; Stroke (age of onset: 87) in his father. There is no history of Colon cancer, Colon polyps, Esophageal cancer, Stomach cancer, or Rectal cancer.  ROS:   Please see the history of present illness. .  All other systems are reviewed and otherwise negative.    EKGs/Labs/Other Studies Reviewed:    Studies reviewed are outlined and summarized above. Reports included below if pertinent.  2D Echo 08/2019  1. Left ventricular ejection fraction, by estimation, is 55 to 60%. The   left ventricle has normal function. The left ventricle has no regional  wall motion abnormalities. There is mild asymmetric left ventricular  hypertrophy of the basal-septal segment.  Left ventricular diastolic parameters were normal.  2. Right ventricular systolic function is normal. The right ventricular  size is normal. There is normal pulmonary artery systolic pressure. The  estimated right ventricular systolic pressure is [id number] mmHg.  3. The mitral valve is degenerative. Mild mitral valve regurgitation. No  evidence of mitral stenosis.  4. The aortic valve has an indeterminant number of cusps. Aortic valve  regurgitation is mild to moderate. No aortic stenosis is present. Aortic  regurgitation PHT measures 282 msec.  5. Ascending aortic aneurysm, 48 mm. Aneurysm of the aortic root,  measuring 50 mm.  6. The inferior vena cava is normal in size with greater than 50%  respiratory variability, suggesting right atrial pressure of 3 mmHg.   Comparison(s): A prior study was performed on 08/22/2018. Prior images  unable to be directly viewed, comparison made by report only. No  significant change from prior study. Aortic valve reported to be tricuspid  on prior TEE. Aortic root 50 mm on this  study. Asc Ao 48 mm. CT [facility] 47 mm. Appears stable per  medical record.   CT Chest 05/2019 FINDINGS:   Thoracic inlet/central airways: Thyroid normal. Airway patent.  Mediastinum/hila/axilla: No adenopathy. Small hiatal hernia with circumferential distal esophageal wall thickening measuring up to 6 mm, slightly increased from 05/27/2018.  Heart/vessels: Postsurgical changes of CABG. Normal heart size. No pericardial effusion. Aneurysmal dilatation of the ascending thoracic aorta measuring up to 4.7 cm, similar to prior. Mild burden atherosclerotic calcifications of the thoracic aorta.   Lungs/pleura: Diffuse subpleural reticulations the lungs. Subsegmental atelectasis in the  right lower lobe. No airspace opacities. No suspicious lung nodule or lung mass identified.   Mild Paraseptal emphysema, Upper lobe predominant. No pleural effusion, or pneumothorax.   Upper abdomen: Adreniform thickening of the adrenal glands without discrete nodularity. Similar size and appearance of 2.5 cm fluid attenuation cortical lesion in the upper pole of the left kidney which is most compatible with a cortical simple cyst.   Chest wall/MSK: Healed median sternotomy with intact mid sternotomy wires.  CONCLUSION:    Minimal change in an ascending thoracic aortic aneurysm, measuring 4.7 cm on today's exam.   Circumferential distal and mid esophageal wall thickening, slightly increased from 05/27/2018 suggestive of gastroesophageal reflux/mild esophagitis.   Similar nonspecific interstitial lung disease.     EKG:  EKG was done today and showed NSR at 60bpm with occasional PVCs and no ST changes  Recent Labs: 07/29/2019: TSH 1.537 09/29/2019: Hemoglobin 13.6; Platelets 229 01/25/2020: ALT 26; BUN 17; Creatinine, Ser 1.52; Potassium 4.8; Sodium 141  Recent Lipid Panel    Component Value Date/Time   CHOL 76 (L) 06/22/2019 1014   TRIG 79 06/22/2019 1014   HDL 40 06/22/2019 1014   CHOLHDL 1.9 06/22/2019 1014   CHOLHDL 2.8 08/22/2018 0645   VLDL 12 08/22/2018 0645   LDLCALC 19 06/22/2019 1014    PHYSICAL EXAM:    VS:  BP 130/66   Pulse (!) 46   Ht 5\' 7"  (1.702 m)   Wt 153 lb 12.8 oz (69.8 kg)   SpO2 98%   BMI 24.09 kg/m   BMI: Body mass index is 24.09 kg/m.  GEN: Well nourished, well developed in no acute distress HEENT: Normal NECK: No JVD; No carotid bruits LYMPHATICS: No lymphadenopathy CARDIAC:RRR, no murmurs, rubs, gallops, occasional ectopy RESPIRATORY:  Clear to auscultation without rales, wheezing or rhonchi  ABDOMEN: Soft, non-tender, non-distended MUSCULOSKELETAL:  No edema; No deformity  SKIN: Warm and dry NEUROLOGIC:  Alert and oriented x 3 PSYCHIATRIC:   Normal affect    Wt Readings from Last 3 Encounters:  07/27/20 153 lb 12.8 oz (69.8 kg)  07/03/20 155 lb (70.3 kg)  05/01/20 150 lb (68 kg)     ASSESSMENT & PLAN:   1. CAD -s/p CABG 1998 -s/p NSTEMI 2020 with Cath 08/2018 showing an occluded left radial graft to the RCA with patent LIMA-LAD with L-R collaterals. Medical therapy was recommended.  -he denies any anginal symptoms -Continue ASA, BB, statin  2.  Paroxysmal atrial flutter -s/p ablation.  -he denies any palpitations -Amiodarone and Eliquis stopped  without clinical recurrence.   3.  Thoracic aortic aneurysm  -2D echo 08/2019 at 64mm in the ascending aorta and 52mm in the aortic root - this is monitored by [facility], Dr. Clementeen Graham >>scan in Nov 2021 stable per patient - reiterated that he needs to abstain from upper body weight lifting -Bp controlled -Continue BB and statin . 4.  Bicuspid aortic valve  -with mild-moderate aortic insufficency - stable by echo 08/2019.  -2D echo pending in 08/2020  5.  HLD -LDL goal < 70 -continue Crestor 20mg  daily -check FLP and CMET  6.  HTN -BP controlled -continue Toprol XL 100mg  daily, amlodipine 10mg  daily and Losartan 25mg  daily -check BMET  Medication Adjustments/Labs and Tests Ordered: Current medicines are reviewed at length with the patient today.  Concerns regarding medicines are outlined above. Medication changes, Labs and Tests ordered today are summarized above and listed in the Patient Instructions accessible in Encounters.   Signed, Fransico Him, MD  07/27/2020 9:16 AM    [facility] [place], [place],   [address] Phone: [phone]; Fax: [phone]

## 2020-08-21 ENCOUNTER — Encounter: Payer: Self-pay | Admitting: Adult Health

## 2020-08-21 MED ORDER — ALPRAZOLAM ER 0.5 MG PO TB24: 0.5000 mg | ORAL_TABLET | Freq: Every day | ORAL | 0 refills | Status: DC

## 2020-08-21 NOTE — Telephone Encounter (Signed)
There is a possibility of Xanax causing a delay in orgasm, but Valium is much more likely to affect this at the dose prescribed. Lets go to Xanax Xr at 0.5 mg and see if that's the right medication to wean off. CD

## 2020-08-21 NOTE — Telephone Encounter (Signed)
Hey Dr. Brett Fairy,   What are your thoughts on the message below?

## 2020-08-21 NOTE — Telephone Encounter (Signed)
I wrote for xanax 0.5 mg XR for 30 days. CD

## 2020-08-22 NOTE — Telephone Encounter (Signed)
He should at least try it and if it doesn't work then we can reevaluate

## 2020-08-22 NOTE — Telephone Encounter (Signed)
I called pt and relayed the message.  He states that he feels that this will not work, feels hopeless.  He is taking 1mg  xanax XR already. Concerned about the lesser dose affecting his sleep. I read the message per Dr. Brett Fairy.  I relayed will send message back to let MM know.

## 2020-08-22 NOTE — Telephone Encounter (Signed)
Sandy-please call patient with message below

## 2020-08-28 ENCOUNTER — Other Ambulatory Visit: Payer: Self-pay

## 2020-08-28 ENCOUNTER — Ambulatory Visit (HOSPITAL_COMMUNITY): Payer: Medicare Other | Attending: Cardiovascular Disease

## 2020-08-28 DIAGNOSIS — Z8679 Personal history of other diseases of the circulatory system: Secondary | ICD-10-CM | POA: Diagnosis present

## 2020-08-28 DIAGNOSIS — Z951 Presence of aortocoronary bypass graft: Secondary | ICD-10-CM

## 2020-08-28 DIAGNOSIS — Q231 Congenital insufficiency of aortic valve: Secondary | ICD-10-CM

## 2020-08-28 DIAGNOSIS — R945 Abnormal results of liver function studies: Secondary | ICD-10-CM | POA: Diagnosis present

## 2020-08-28 DIAGNOSIS — I2510 Atherosclerotic heart disease of native coronary artery without angina pectoris: Secondary | ICD-10-CM | POA: Diagnosis present

## 2020-08-28 DIAGNOSIS — I351 Nonrheumatic aortic (valve) insufficiency: Secondary | ICD-10-CM

## 2020-08-28 DIAGNOSIS — R7989 Other specified abnormal findings of blood chemistry: Secondary | ICD-10-CM

## 2020-08-28 DIAGNOSIS — I712 Thoracic aortic aneurysm, without rupture, unspecified: Secondary | ICD-10-CM

## 2020-08-28 LAB — ECHOCARDIOGRAM COMPLETE
Area-P 1/2: 3.77 cm2
P 1/2 time: 438 msec
S' Lateral: 3 cm

## 2020-09-13 ENCOUNTER — Other Ambulatory Visit: Payer: Self-pay | Admitting: *Deleted

## 2020-09-13 DIAGNOSIS — G43109 Migraine with aura, not intractable, without status migrainosus: Secondary | ICD-10-CM

## 2020-09-13 MED ORDER — ZONISAMIDE 25 MG PO CAPS: 50.0000 mg | ORAL_CAPSULE | Freq: Every day | ORAL | 1 refills | Status: DC

## 2020-09-19 ENCOUNTER — Other Ambulatory Visit: Payer: Self-pay

## 2020-09-19 MED ORDER — METOPROLOL SUCCINATE ER 100 MG PO TB24: 100.0000 mg | ORAL_TABLET | Freq: Every day | ORAL | 0 refills | Status: DC

## 2020-09-21 ENCOUNTER — Encounter: Payer: Self-pay | Admitting: Adult Health

## 2020-09-21 NOTE — Telephone Encounter (Signed)
Try for another 30 days

## 2020-09-24 ENCOUNTER — Other Ambulatory Visit: Payer: Self-pay | Admitting: Neurology

## 2020-09-25 MED ORDER — ALPRAZOLAM ER 0.5 MG PO TB24: 0.5000 mg | ORAL_TABLET | Freq: Every day | ORAL | 0 refills | Status: DC

## 2020-10-25 ENCOUNTER — Encounter: Payer: Self-pay | Admitting: Adult Health

## 2020-10-26 ENCOUNTER — Telehealth: Payer: Self-pay | Admitting: Adult Health

## 2020-10-26 NOTE — Telephone Encounter (Signed)
I called pt relayed that his message is being worked on, in Dr. Valentina Shaggy corner to address.  Will let him know as soon as addressed.  Quality and duration of sleep issue. Go back to xanax 1mg  ro different med.  He stated to wish Dr. Brett Fairy well.

## 2020-10-26 NOTE — Telephone Encounter (Signed)
Pt has called in response to the my chart message that he sent to Megan,NP on yesterday morning.  Pt was reminded there is an allowance of 24-48 hours for messages to be responded to.  Pt states this is of an important nature and he wants to know if Jinny Blossom ,NP will take him back to 1mg  of the ALPRAZolam (XANAX XR) 0.5 MG 24 hr tablet to or if they will try something different, please call when available.

## 2020-10-29 NOTE — Telephone Encounter (Signed)
2 tabs of total 1 mg Xanax XR are to be taken for sleep- he can split them up if he feels this is sedating too much. Can take one at 9 Pm and one at 54 Pm or whenever bedtime is. He can also take the second dose at night, as long as he is not driving/ operating machinery for another 6-7 hours post intake.

## 2020-10-30 NOTE — Telephone Encounter (Signed)
I called pt and relayed to him that per Dr. Brett Fairy, that he may go back to taking 1mg  (2 tabs of 0.5mg ) taken for sleep, this if too sedating may split dose to 2100, then 2200 if not driving 6-7 hours post intake of last dose.  Pt states that he feels like the message was not portrayed correctly.  Him needing more then 1 mg to sleep.  I relayed that he was on xanax xr 1mg  dosing decreased to 0.5mg  daily dosing to see if this would decrease effect of ED.  This had no effect and will be following up with other MD for ED, but wanted to go back to 1mg  dose.  I relayed that this is what was messaged to physician and response was ok to resume.  I made appt for his request with Dr. Brett Fairy 11-15-20 at 0930.  Needs refill at this time.  Will forward to MM/NP.

## 2020-10-30 NOTE — Addendum Note (Signed)
Addended by: Oliver Hum S on: 10/30/2020 12:05 PM   Modules accepted: Orders

## 2020-10-31 MED ORDER — ALPRAZOLAM ER 1 MG PO TB24: 1.0000 mg | ORAL_TABLET | Freq: Every day | ORAL | 2 refills | Status: DC

## 2020-10-31 NOTE — Telephone Encounter (Signed)
Patient  did well on Xanax Er , I would recommnend to stay on it.  The delayed ejaculation can be a side effect of many medications, and can be age related,and can be seen in endocrine disorders too.  Now we know xanax was not it.  Would he speak to urologist about this observation , please?Larey Seat, MD

## 2020-11-15 ENCOUNTER — Encounter: Payer: Self-pay | Admitting: Neurology

## 2020-11-15 ENCOUNTER — Ambulatory Visit (INDEPENDENT_AMBULATORY_CARE_PROVIDER_SITE_OTHER): Payer: Medicare Other | Admitting: Neurology

## 2020-11-15 ENCOUNTER — Other Ambulatory Visit: Payer: Self-pay

## 2020-11-15 VITALS — BP 140/70 | HR 57 | Ht 67.0 in | Wt 143.0 lb

## 2020-11-15 DIAGNOSIS — F5104 Psychophysiologic insomnia: Secondary | ICD-10-CM | POA: Diagnosis not present

## 2020-11-15 DIAGNOSIS — G4731 Primary central sleep apnea: Secondary | ICD-10-CM | POA: Diagnosis not present

## 2020-11-15 DIAGNOSIS — I483 Typical atrial flutter: Secondary | ICD-10-CM | POA: Diagnosis not present

## 2020-11-15 DIAGNOSIS — I2510 Atherosclerotic heart disease of native coronary artery without angina pectoris: Secondary | ICD-10-CM

## 2020-11-15 DIAGNOSIS — G43509 Persistent migraine aura without cerebral infarction, not intractable, without status migrainosus: Secondary | ICD-10-CM | POA: Insufficient documentation

## 2020-11-15 DIAGNOSIS — G43109 Migraine with aura, not intractable, without status migrainosus: Secondary | ICD-10-CM

## 2020-11-15 MED ORDER — BUTALBITAL-ASA-CAFF-CODEINE 50-325-40-30 MG PO CAPS: ORAL_CAPSULE | ORAL | 5 refills | Status: DC

## 2020-11-15 MED ORDER — ZONISAMIDE 25 MG PO CAPS: 50.0000 mg | ORAL_CAPSULE | Freq: Every day | ORAL | 1 refills | Status: DC

## 2020-11-15 MED ORDER — ALPRAZOLAM ER 1 MG PO TB24: 1.0000 mg | ORAL_TABLET | Freq: Every day | ORAL | 2 refills | Status: DC

## 2020-11-15 MED ORDER — TRAZODONE HCL 50 MG PO TABS: 50.0000 mg | ORAL_TABLET | Freq: Every day | ORAL | 5 refills | Status: DC

## 2020-11-15 NOTE — Patient Instructions (Signed)

## 2020-11-15 NOTE — Progress Notes (Signed)
SLEEP MEDICINE CLINIC   Provider:  Larey Seat, M D  Referring Provider: Burman Freestone, MD Primary Care Physician:  Burman Freestone, MD  Chief Complaint  Patient presents with  . Follow-up    Pt alone, rm 10. Pt f/u with sleep concerns. describes several stressors going on at this time. He has lost 12 lbs (not trying to) since last visit in December 2021.     HPI:  [name] is a 75 y.o. male  And is seen on 11-15-2020,  a Rv.  Last visit was in with [name] , NP .   [name] is a meanwhile 75 year old Caucasian gentleman whom I have followed now for about 5 years.  [name] reports that his headache has been much better under control he is actually very happy about this aspect of his neurological health.  He has lost some weight since December 2021 and his BMI is now 22.4.  He is using zonisamide as a headache preventative and for acute breakthrough headaches he will use the occasional Fiorinal. 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  He certainly was exposed to very loud noises.  His sleep has always changed since after that.  He during [employer] he was treated with some elbows he used to use [employer], Talwin, Darvin, Darvocet, fentanyl, Percodan, morphine, Percocet, oxycodone, hydrocodone, Demerol injections, and no Fiorinal as needed so there have been various opiates prescribed, time of 30 years ago but currently he is only on Fiorinal as needed and a prescription lasts him for over 90 days.  I do not think he has a history of opiate addiction.   [name] also had related to me that his urologist felt the use of Xanax 0.5 mg or 1 mg at night may contribute to some sexual problems.  A reduction from 1 mg to half milligram over a period of 2 months did not change his concerns and regarding to orgasm delay.  He has resumed some 1 mg  he is actually reporting that sometimes a 1 mg dose is not alone able to get him to sleep but he also acknowledges that there are some additional emotional stressors on those nights that Xanax just cannot treat "".  Discussing to add Belsomra and use the alprazolam more for anxiety than 4 insomnia I hope that the switch will be well-tolerated.  The patient has a history of sleep apnea he is 100% compliant CPAP user his CPAP is set at a pressure of 10 cmH2O there is 3 cm EPR and his residual AHI is 2.6 he does have some higher air leakage overall. He has well-controlled and he had 2% of the nights Cheyne-Stokes respirations.  This is a setting he tolerates well as of 7-1/2 hours of nightly use but also for an improved tolerance of CPAP on FFM . He has a beard - he can't get a seal with that.   I would like to replace the machine soon because it will be 73 years old as of October 2022 and usually the manufacturer will discontinue supporting the software after 5 years  My goal would be to just obtain a home sleep test 1 night at home without CPAP and use to have a baseline and then order a new machine based on what we find.   I would like for [name] to use  his medication as he usually does so we see the effect on apnea that it may have.        06-05-2016 He presented with difficulties initiating and maintaining sleep was tested for obstructive sleep apnea and the results were positive.  He was quite apprehensive initially starting on CPAP but has meanwhile mastered the use very well and has benefited greatly.  He has become a very compliant patient 97% for the last 30 days, 8 hours of average nighttime use, with a CPAP set at 10 cmH2O with 3 cm EPR his residual AHI was 4.0 of which 2.1 apneas are obstructive in nature.  He does have some central apnea arising under treatment about 3% of the night.  For this reason I would not like to increase his pressure further. In cooperation with his counselor I  have also provided alprazolam 1 mg extended release which has helped with his chronic insomnia.  He has never abused the medication, and is not asking for early refills.  Occasionally he will suffer a headache that is best responding to butalbital, here again the last prescription is from July 28, 2017 and at this time he still has 10 of his 30 pills left. I migraine a week from 4 a week before treatment.  The patient is also a VA patient - and I wrote a letter to the [place] [employer], and today we go through a questionnaire he was send.   Originally seen here as a referral from Dr. Kerney Elbe -Larena Glassman, MD at [facility] for a sleep evaluation, on 05-07-2016 [name] has a history of hypercholesterolemia of heart disease, migraines, anxiety and hypertension he had undergone a cardiac bypass surgery in 1998 at a rather young age, and most recently had surgery for the replacement of cloudy lenses, both eyes had cataract surgery in 2013 at 2014. He describes insomnia and headaches as well as anxiety and hearing loss. He is to establish himself as a new neurologist and to see if his headache history is correlated with his sleep disturbance.  He has an aortic aneurysm, arthritis, Aortic valve regurgitation.  The patient reports that headaches prevent him from going to sleep but they do not wake him up and then not always present when he wakes up. He was originally diagnosed in college with migrainous headaches 50 years ago, these headaches are intense, sometimes associated with a visual aura, almost always nausea to the level of emesis was present. Migraines may have lasted for longer than 24 hours up to 3 days in its worst kind. He had photophobia, phonophobia but no olfactory triggers.He was treated with powerful narcotics, such as Talwin, Mepergan forte, Percodan, Percocet and nasal sprays . He would have as many as 15 migraines with hospital admission per semester, affecting his social and professional life.  After his bypass surgery in 1998 he experienced a significant reduction in migraine frequency but not intensity.  A beta blocker had been prescribed and seemed to reduce his headache frequency. He was switched to Fiorinal, and has been happy with the fact to this day. For 25 years he has been able to control his migraines and stay out of the emergency room, needing no infusions or injections.He has kept a daily record of his headaches he takes Excedrin for tension headache and fewer note this codeine for migraines only. He had a migraine on August 1, the 11th, August 27 and 28th, and apparently found relief with Fiorinal. He has never overused the  medication, never  requested early refills. He takes on average 5 -7 fiorinal per month.   Sleep habits are as follows:  He used to have nocturia times 5, ,but after implementing Cialis 5 mg daily use he only experienced this 1-2 nightly.  Bedtime is late-, between 11 PM and midnight . He falls asleep within 10-15 minutes. He uses Trazodone, melatonin without much success.  He frequently wakes up after 4 hours of sleep sometimes 5, 50% of the nights he cannot continue and return to sleep and will and right there. He has to rise in the morning at about 6:30 AM. This allows for only 6-1/2 hours of sleep. It has been a long-standing habit of his to go to bed at bedtime and rise at that time. Some nights he will only get 4 hours of sleep.His bedroom is described as cool, quiet and dark. He sleeps in supine position, and uses just one pillow for head support. Waking up in the morning is only on occasion associated with a headache, normally no dry mouth nor diaphoresis palpitations. He does not report any nightmares. He is usually not woken by pain of any kind. He does not suffer from gastroesophageal acid reflux.   Interval history from 06/05/2016 [name] returns after his recent sleep study which took place on 05/26/2016 listed as referral PCP is  Debbrah Alar, N practitioner .  The patient's sleep study revealed an AHI of 39.8 there was no REM sleep noted he had complex apnea, consisting of 44 obstructive apneas and 25 centrals and 14 mixed apneas in supine his apnea was not stronger than in nonsupine position he did not have significant oxygen desaturations or hypercapnia. I have recommended to return for a full night CPAP titration which we discussed today. [name] is concerned that CPAP will be a burden on him and his social life. I assured him that we should try CPAP first and he may have pleasant surprise actually enjoy sleeping with a device. If he is unable to tolerate CPAP he can surely try a dental device but it is not likely as successful as the CPAP in alleviating apnea.  Interval history from 10/10/2016, [name] is doing exceptionally well, he is highly compliant with CPAP use at 7 cm water pressure with 3 cm EPR, his residual AHI is 10.5 little bit too high and I would like to increase his pressure just by a notch. We will go to 9 cm water pressure with 3 cm EPR after today's visit. He has used the machine 8 hours and 18 minutes at night the residual apneas are obstructive in nature. He had undergone a CPAP titration on 06/25/2016 but his residual AHI was 0.2 per hour at 7 cm water pressure. He slept mostly in supine position during that test did not show any hypoxemia or hypercapnia and was fitted with a full face mask in medium size a model called Simplus. He also has seen otolaryngology in the meantime an audiogram was performed on 09/20/2016 and it appears that these are service related injuries to his hearing. These also have promoted tinnitus. Hearing loss with bilateral. He has used sparingly Fiorinal and actually brought me the bottle he had not had a refill since January 1 there are still 5 pills left of the 30 prescribed . He uses about 8 per month. He no longer reports excessive daytime sleepiness and finds restful and prolonged  sound sleep with Xanax extended release. This had been recommended by his counselor, but  she does not have prescription privileges. I have taken this prescription over.  History of Present Illness:  [name] is a 75 y.o. male with a history of CAD with CABG in 1998 after presenting with USAP.  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  He is followed by Dr. Clementeen Graham at [facility].   He has mild carotid artery stenosis on dopplers.  He has a history of dyslipidemia.  He has a history of mild renal insufficiency felt secondary to hypertensive nephrosclerosis.  He denies any chest pain or pressure, SOB, DOE, dizziness, palpitations, claudication or syncope.  He occasionally has some pedal edema if he eats too much salt in his diet.     Review of Systems: Out of a complete 14 system review, the patient complains of only the following symptoms, and all other reviewed systems are negative. Sleeps more than 6 hours nightly on Xanax, no more headaches in AM, rare need for Fiorinal ( 8 a month ) ,   Migraine with visual aura, nausea and photophobia. He feels movement aggravated his migraines, now he has 1 a week from 4 a week.   Epworth score 1 , Fatigue severity score 20  , geriatric  depression score 1/15    Social History   Socioeconomic History  . Marital status: Divorced    Spouse name: Not on file  . Number of children: 0  . Years of education: Not on file  . Highest education level: Not on file  Occupational History  . Occupation: retired  Tobacco Use  . Smoking status: Former Smoker    Years: 20.00    Quit date: 07/22/1986    Years since quitting: 34.3  . Smokeless tobacco: Never Used  Vaping Use  . Vaping Use: Never used  Substance and Sexual Activity  . Alcohol use: Yes    Alcohol/week: 1.0 standard drink    Types: 1 Glasses of wine per week    Comment: 0-1 daily  . Drug use: No  . Sexual activity: Not  on file  Other Topics Concern  . Not on file  Social History Narrative   Divorced   Secondary school teacher- retired   No children   Has a Neurosurgeon named [name]   Enjoys outdoor activities- hiking, swimming, Control and instrumentation engineer, baseball, writing, reading, cooking   Social Determinants of Radio broadcast assistant Strain: Not on [employer] Insecurity: Not on file  Transportation Needs: Not on file  Physical Activity: Not on file  Stress: Not on file  Social Connections: Not on file  Intimate Partner Violence: Not on file    Family History  Problem Relation Age of Onset  . Arthritis Mother        deceased  . Hyperlipidemia Mother   . Diabetes Mother   . Arthritis Father   . Hyperlipidemia Father   . Heart disease Father 58  . Stroke Father 40       deceased  . Hypertension Father   . Diabetes Father   . Kidney disease Paternal Grandfather   . Colon cancer Neg Hx   . Colon polyps Neg Hx   . Esophageal cancer Neg Hx   . Stomach cancer Neg Hx   . Rectal cancer Neg Hx     Past Medical History:  Diagnosis Date  . Anal fissure   . Aortic insufficiency   . Arthritis   . Atrial flutter (  [place])   . Bicuspid aortic valve    with mild to moderate AR by echo 08/2019  . BPH (benign prostatic hyperplasia)   . CAD (coronary artery disease)    s/p CABG [place]  . Cataract    bilateral-removed  . CKD (chronic kidney disease), stage III ([place])   . Depression   . Dilated aortic root ([place])   . History of blood transfusion 1998   "due to cardiac bypass surgery"  . Hyperlipidemia   . Hypertension   . Hypogonadism in male   . Kidney stones   . Migraine   . Mild carotid artery disease ([place]) 2009  . Myocardial infarction ([place])   . PVC's (premature ventricular contractions)   . Retinal hemorrhage 11/04/2014  . Sleep apnea with use of continuous positive airway pressure (CPAP)   . Thoracic aortic aneurysm (TAA) ([place])     Past Surgical History:  Procedure Laterality Date  . A-FLUTTER  ABLATION N/A 09/29/2019   Procedure: A-FLUTTER ABLATION;  Surgeon: Constance Haw, MD;  Location: [facility];  Service: Cardiovascular;  Laterality: N/A;  . CARDIOVERSION N/A 08/10/2019   Procedure: CARDIOVERSION;  Surgeon: Elouise Munroe, MD;  Location: [facility] ENDOSCOPY;  Service: Cardiovascular;  Laterality: N/A;  . cataracts    . COLONOSCOPY    . CORONARY ARTERY BYPASS GRAFT  1998  . LEFT HEART CATH AND CORONARY ANGIOGRAPHY N/A 08/24/2018   Procedure: LEFT HEART CATH AND CORONARY ANGIOGRAPHY;  Surgeon: Lorretta Harp, MD;  Location: [facility];  Service: Cardiovascular;  Laterality: N/A;  . TEE WITHOUT CARDIOVERSION N/A 01/19/2015   Procedure: TRANSESOPHAGEAL ECHOCARDIOGRAM (TEE);  Surgeon: Sueanne Margarita, MD;  Location: [facility] ENDOSCOPY;  Service: Cardiovascular;  Laterality: N/A;  . [place]   pt denies    Current Outpatient Medications  Medication Sig Dispense Refill  . acetaminophen (TYLENOL) 325 MG tablet Take 650 mg by mouth every 6 (six) hours as needed for mild pain or moderate pain.    Marland Kitchen ALPRAZolam (XANAX XR) 1 MG 24 hr tablet Take 1 tablet (1 mg total) by mouth daily. 30 tablet 2  . amLODipine (NORVASC) 10 MG tablet Take 1 tablet (10 mg total) by mouth daily. 90 tablet 3  . Ascorbic Acid (VITAMIN C) 1000 MG tablet Take 1,000 mg by mouth 2 (two) times daily.    Marland Kitchen aspirin EC 81 MG tablet Take 1 tablet (81 mg total) by mouth daily. 90 tablet 3  . aspirin-acetaminophen-caffeine (EXCEDRIN MIGRAINE) 250-250-65 MG tablet Take 2 tablets by mouth daily as needed for headache.     . butalbital-aspirin-caffeine-codeine (FIORINAL WITH CODEINE) 50-325-40-30 MG capsule TAKE ONE CAPSULE BY MOUTH EVERY 4 HOURS AS NEEDED FOR MIGRAINE 30 capsule 5  . diazepam (VALIUM) 5 MG tablet Take 2.5-5 mg by mouth 2 (two) times daily as needed for anxiety.     Marland Kitchen ibuprofen (ADVIL,MOTRIN) 200 MG tablet Take 200-400 mg by mouth 2 (two) times daily as needed (for arthritis in  the feet).     Marland Kitchen losartan (COZAAR) 25 MG tablet Take 1 tablet (25 mg total) by mouth daily. 90 tablet 3  . metoprolol succinate (TOPROL-XL) 100 MG 24 hr tablet Take 1 tablet (100 mg total) by mouth daily. Take with or immediately following a meal. Please make yearly appt with Dr. Curt Bears for April 2022 for future refills. Thank you 1st attempt 90 tablet 0  . niacin 500 MG tablet Take 500 mg by mouth at  bedtime.    . rosuvastatin (CRESTOR) 20 MG tablet TAKE ONE TABLET BY MOUTH DAILY 90 tablet 2  . Specialty Vitamins Products (CENTRUM SPECIALIST ENERGY) TABS Take 1 tablet by mouth daily with breakfast.    . Testosterone 40.5 MG/2.5GM (1.62%) GEL Place 1 application onto the skin daily. Apply 2 pumps to each shoulder every morning    . Turmeric 500 MG CAPS Take 1,000 mg by mouth daily.     . vitamin E 400 UNIT capsule Take 400 Units by mouth at bedtime.     Marland Kitchen zonisamide (ZONEGRAN) 25 MG capsule Take 2 capsules (50 mg total) by mouth daily. 180 capsule 1   Current Facility-Administered Medications  Medication Dose Route Frequency Provider Last Rate Last Admin  . 0.9 %  sodium chloride infusion  500 mL Intravenous Once Thornton Park, MD      . 0.9 %  sodium chloride infusion  500 mL Intravenous Once Armbruster, Carlota Raspberry, MD        Allergies as of 11/15/2020 - Review Complete 11/15/2020  Allergen Reaction Noted  . Dog epithelium allergy skin test Itching, Anxiety, Palpitations, and Other (See Comments) January 03, 1946  . Mushroom extract complex Nausea Only and Other (See Comments) 09/21/2014    Vitals: BP 140/70   Pulse (!) 57   Ht 5\' 7"  (1.702 m)   Wt 143 lb (64.9 kg)   BMI 22.40 kg/m  Last Weight:  Wt Readings from Last 1 Encounters:  11/15/20 143 lb (64.9 kg)   [id number] mass index is 22.4 kg/m.     Last Height:   Ht Readings from Last 1 Encounters:  11/15/20 5\' 7"  (1.702 m)    Physical exam:  General: The patient is awake, alert and appears not in acute distress. The patient is  well groomed. Head: Normocephalic, atraumatic. Neck is supple. Mallampati 2 ,  Dentures partial neck circumference:15. Nasal airflow patent , TMJ not evident. Retrognathia is seen.  Cardiovascular:  Regular rate and rhythm , without murmurs or carotid bruit, and without distended neck veins. Respiratory: Lungs are clear to auscultation.Skin:  Without evidence of edema, or rashTrunk: BMI is normal. The patient's posture is erect   Neurologic exam : The patient is awake and alert, oriented to place and time.   Attention span & concentration ability appears impaired. Speech is fluent, without dysarthria, dysphonia or aphasia.  Mood and affect are appropriate.  Cranial nerves:  No change in taste or smell. Pupils are equal and briskly reactive to light. Funduscopic exam without evidence of pallor or edema.  Extraocular movements  in vertical and horizontal planes intact and without nystagmus. Visual fields by finger perimetry are intact. Hearing impaired.   Facial sensation intact to fine touch. Facial motor strength is symmetric and tongue and uvula move midline.  Shoulder shrug was symmetrical.   Normal muscle bulk and tone, no tremor, ataxia or dysmetria, and preserved , symmetric DTR.    The patient was advised of the nature of the diagnosed sleep disorder , the treatment options and risks for general a health and wellness arising from not treating the condition.  I spent more than 20 minutes of face to face time with the patient. Greater than 50% of time was spent in counseling and coordination of care. We have discussed the diagnosis and differential and I answered the patient's questions.     Assessment:  After physical and neurologic examination, review of laboratory studies,  Personal review of imaging studies, reports of other /same  Imaging studies ,  Results of polysomnography/ neurophysiology testing and pre-existing records as far as provided in visit., my assessment is   1)   insomnia - much improved on CPAP and xanax xr .  There is a chronic component to his insomnia     He uses CPAP-  Compliantly. I will order a HST for may-June 2022 and we will go to replace CPAP based on results by 11/ 2022.   He has been diagnosed with complex sleep apnea and mild - moderate PLMs, No Co2 retention, no hypoxemia that would explain the headaches. He has used opiates, but sporadically since HA were controlled on preventive medication, used fiorinal not frequently ! .  He uses CPAP now, compliantly .  3) he had a mixed headache type, most frequently suffering from tension headaches (which respond to non steroidals), but more infrequently this days from migraine headaches which require Excedrin or Fiorinal. He assured me that over the last almost 25 years that he has been taking the medication.We were able to reduce his headache frequency for migraines to 1 or 2 a month- on zonisamide.,  I have no problems to maintain him on Fiorinal. He brought his Fiorinal prescription to today's appointment and he has taken 8 pills since July 23 2016.  The patient has an extensive cardiovascular health history, Not a TRIPTAN candidate as he is a patient with aortic valve disease, coronary artery disease status post bypass.    Plan:  Treatment plan and additional workup :  Rv in 6 month for new CPAP order after HST repeat, machine will be 74 years old in 11/ 2022. Marland Kitchen   Refills for Xanax prn refill and wanted to start [place]- his insurance doesn't cover this drug.  I will therefor start with trazodone, 25 -50 mg at night po, and he is allowed xanax prn.    Asencion Partridge Tekoa Hamor MD  11/15/2020   CC: Burman Freestone, [address] [place],  [place] [id number]

## 2020-11-16 ENCOUNTER — Telehealth: Payer: Self-pay | Admitting: Neurology

## 2020-11-16 NOTE — Telephone Encounter (Signed)
PA submitted on CMM/ [employer] medicare for the patient. DEY:[id number] Can take 1- days before getting a response

## 2020-11-16 NOTE — Telephone Encounter (Signed)
PA approved [employer] for the patient.  Approved 07/22/20-07/21/21

## 2020-11-27 ENCOUNTER — Telehealth: Payer: Self-pay

## 2020-11-27 NOTE — Telephone Encounter (Signed)
Tried returning call back but no answer. LVM for pt to call me back to schedule sleep study

## 2020-12-05 DIAGNOSIS — Z0289 Encounter for other administrative examinations: Secondary | ICD-10-CM

## 2020-12-07 ENCOUNTER — Encounter: Payer: Self-pay | Admitting: Neurology

## 2020-12-19 ENCOUNTER — Other Ambulatory Visit: Payer: Self-pay | Admitting: Cardiology

## 2020-12-20 ENCOUNTER — Other Ambulatory Visit: Payer: Self-pay | Admitting: *Deleted

## 2020-12-20 MED ORDER — METOPROLOL SUCCINATE ER 100 MG PO TB24: 100.0000 mg | ORAL_TABLET | Freq: Every day | ORAL | 3 refills | Status: DC

## 2020-12-25 ENCOUNTER — Ambulatory Visit (INDEPENDENT_AMBULATORY_CARE_PROVIDER_SITE_OTHER): Payer: Medicare Other | Admitting: Neurology

## 2020-12-25 ENCOUNTER — Encounter: Payer: Self-pay | Admitting: Neurology

## 2020-12-25 DIAGNOSIS — G4733 Obstructive sleep apnea (adult) (pediatric): Secondary | ICD-10-CM | POA: Diagnosis not present

## 2020-12-25 DIAGNOSIS — F5104 Psychophysiologic insomnia: Secondary | ICD-10-CM

## 2020-12-25 DIAGNOSIS — I483 Typical atrial flutter: Secondary | ICD-10-CM

## 2020-12-25 DIAGNOSIS — G4731 Primary central sleep apnea: Secondary | ICD-10-CM

## 2020-12-25 DIAGNOSIS — G43109 Migraine with aura, not intractable, without status migrainosus: Secondary | ICD-10-CM

## 2020-12-26 ENCOUNTER — Other Ambulatory Visit: Payer: Self-pay | Admitting: *Deleted

## 2020-12-26 ENCOUNTER — Encounter: Payer: Self-pay | Admitting: Neurology

## 2020-12-26 NOTE — Telephone Encounter (Signed)
[place] narcotic registry checked. Last filled for #30 on 11/30/20. Per vo by Dr. Brett Fairy, she will authorize a 90-day supply of alprazolam for the patient so [name] will not run out during his trip. I called [name] and spoke to [place]. She voided all remaining refills for this medication on file. Post-dated rx sent to MD for approval. I called the patient and provided this update. [name] is aware the sleep study results are not back yet.

## 2020-12-27 MED ORDER — ALPRAZOLAM ER 1 MG PO TB24: 1.0000 mg | ORAL_TABLET | Freq: Every day | ORAL | 0 refills | Status: DC

## 2020-12-28 ENCOUNTER — Other Ambulatory Visit: Payer: Self-pay

## 2020-12-28 ENCOUNTER — Ambulatory Visit: Payer: Medicare Other | Admitting: Cardiology

## 2020-12-28 ENCOUNTER — Ambulatory Visit (INDEPENDENT_AMBULATORY_CARE_PROVIDER_SITE_OTHER): Payer: Medicare Other | Admitting: Cardiology

## 2020-12-28 ENCOUNTER — Encounter: Payer: Self-pay | Admitting: Cardiology

## 2020-12-28 VITALS — BP 124/62 | HR 58 | Ht 67.0 in | Wt 149.0 lb

## 2020-12-28 DIAGNOSIS — I712 Thoracic aortic aneurysm, without rupture: Secondary | ICD-10-CM

## 2020-12-28 DIAGNOSIS — Q231 Congenital insufficiency of aortic valve: Secondary | ICD-10-CM

## 2020-12-28 DIAGNOSIS — I10 Essential (primary) hypertension: Secondary | ICD-10-CM

## 2020-12-28 DIAGNOSIS — I2510 Atherosclerotic heart disease of native coronary artery without angina pectoris: Secondary | ICD-10-CM | POA: Diagnosis not present

## 2020-12-28 DIAGNOSIS — E7800 Pure hypercholesterolemia, unspecified: Secondary | ICD-10-CM

## 2020-12-28 DIAGNOSIS — I483 Typical atrial flutter: Secondary | ICD-10-CM

## 2020-12-28 DIAGNOSIS — I7121 Aneurysm of the ascending aorta, without rupture: Secondary | ICD-10-CM

## 2020-12-28 LAB — BASIC METABOLIC PANEL
BUN/Creatinine Ratio: 14 (ref 10–24)
BUN: 22 mg/dL (ref 8–27)
CO2: 22 mmol/L (ref 20–29)
Calcium: 8.7 mg/dL (ref 8.6–10.2)
Chloride: 106 mmol/L (ref 96–106)
Creatinine, Ser: 1.57 mg/dL — ABNORMAL HIGH (ref 0.76–1.27)
Glucose: 98 mg/dL (ref 65–99)
Potassium: 4.4 mmol/L (ref 3.5–5.2)
Sodium: 142 mmol/L (ref 134–144)
eGFR: 47 mL/min/{1.73_m2} — ABNORMAL LOW (ref >59)

## 2020-12-28 MED ORDER — LOSARTAN POTASSIUM 25 MG PO TABS: 25.0000 mg | ORAL_TABLET | Freq: Every day | ORAL | 3 refills | Status: DC

## 2020-12-28 MED ORDER — AMLODIPINE BESYLATE 10 MG PO TABS: 1.0000 | ORAL_TABLET | Freq: Every day | ORAL | 3 refills | Status: DC

## 2020-12-28 MED ORDER — ROSUVASTATIN CALCIUM 20 MG PO TABS: 20.0000 mg | ORAL_TABLET | Freq: Every day | ORAL | 3 refills | Status: DC

## 2020-12-28 MED ORDER — NEBIVOLOL HCL 10 MG PO TABS: 10.0000 mg | ORAL_TABLET | Freq: Every day | ORAL | 3 refills | Status: DC

## 2020-12-28 NOTE — Progress Notes (Addendum)
Cardiology Office Note    Date:  12/28/2020   ID:  [name], DOB [birth year], MRN [id number]  PCP:  Drew Freestone, MD  Cardiologist:  Drew Him, MD  Electrophysiologist:  None   Chief Complaint: CAD, atrial flutter, AI, aortic aneurysm, HLD, HTN  History of Present Illness:   [name] is a 75 y.o. male with history of CAD s/p CABG 1998, bicuspid aortic valve with aortic insufficiency, dilated ascending aorta and aortic root (4.7cm TAA on CT chest 05/2019), hyperlipidemia, hypertension, OSA (followed by neurology), mild carotid artery disease (by duplex 2009), CKD stage III reportedly due to hypertensive nephrosclerosis, atrial flutter, PVCs, hypogonadism, retinal hemorrhage who presents for follow-up.   [place]. He has a known bicuspid AV and dilated aortic root followed at [facility]. He was admitted to [facility] in early 2020 with NSTEMI. Cath 08/2018 showed an occluded left radial graft to the RCA with patent LIMA-LAD with L-R collaterals. Medical therapy was recommended.   He was having increased palpitations December 2020 with 30-day monitor showing episodes of atrial flutter in the 120s. He was started on anticoagulation. Repeat echo 08/2019 showed EF 55-60%, mild asymmetric LVH, normal PASP, mild MR, mild-moderate AI, ascending aortic aneurysm of 29mm with aneurysm of aortic root measuring 11mm. He was seen by atrial fib clinic and placed on amiodarone and underwent cardioversion. He subsequently saw Dr. Curt Bears and underwent ablation of his atrial flutter in 09/2019.  CT chest in 05/2019 also demonstrated nonspecific interstitial lung disease.   He is here today for followup and is doing well.  he denies any chest pain or pressure, SOB, DOE, PND, orthopnea, LE edema, dizziness, palpitations or syncope. He is compliant with his meds and is tolerating meds with no SE.    Past Medical History:  Diagnosis Date   Anal fissure    Aortic  insufficiency    Arthritis    Atrial flutter (HCC)    Bicuspid aortic valve    with mild to moderate AR by echo 08/2019   BPH (benign prostatic hyperplasia)    CAD (coronary artery disease)    s/p CABG [place] [place]   Cataract    bilateral-removed   CKD (chronic kidney disease), stage III ([place])    Depression    Dilated aortic root (HCC)    History of blood transfusion 1998   "due to cardiac bypass surgery"   Hyperlipidemia    Hypertension    Hypogonadism in male    Kidney stones    Migraine    Mild carotid artery disease ([place]) 2009   Myocardial infarction ([place])    PVC's (premature ventricular contractions)    Retinal hemorrhage 11/04/2014   Sleep apnea with use of continuous positive airway pressure (CPAP)    Thoracic aortic aneurysm (TAA) ([place])     Past Surgical History:  Procedure Laterality Date   A-FLUTTER ABLATION N/A 09/29/2019   Procedure: A-FLUTTER ABLATION;  Surgeon: Constance Haw, MD;  Location: [place] CV LAB;  Service: Cardiovascular;  Laterality: N/A;   CARDIOVERSION N/A 08/10/2019   Procedure: CARDIOVERSION;  Surgeon: Elouise Munroe, MD;  Location: [facility] ENDOSCOPY;  Service: Cardiovascular;  Laterality: N/A;   cataracts     COLONOSCOPY     CORONARY ARTERY BYPASS GRAFT  1998   LEFT HEART CATH AND CORONARY ANGIOGRAPHY N/A 08/24/2018   Procedure: LEFT HEART CATH AND CORONARY ANGIOGRAPHY;  Surgeon: Lorretta Harp, MD;  Location: [facility]  CV LAB;  Service: Cardiovascular;  Laterality: N/A;   TEE WITHOUT CARDIOVERSION N/A 01/19/2015   Procedure: TRANSESOPHAGEAL ECHOCARDIOGRAM (TEE);  Surgeon: Sueanne Margarita, MD;  Location: [facility] ENDOSCOPY;  Service: Cardiovascular;  Laterality: N/A;   [place]   pt denies    Current Medications: Current Meds  Medication Sig   acetaminophen (TYLENOL) 325 MG tablet Take 650 mg by mouth every 6 (six) hours as needed for mild pain or moderate pain.   ALPRAZolam (ALPRAZOLAM XR) 1 MG 24 hr tablet  Take 1 tablet (1 mg total) by mouth daily.   amLODipine (NORVASC) 10 MG tablet Take 1 tablet (10 mg total) by mouth daily.   amoxicillin (AMOXIL) 500 MG capsule Take 4 tablets, 2 grams, 1 hour before each of your three dental procedures.   Ascorbic Acid (VITAMIN C) 1000 MG tablet Take 1,000 mg by mouth 2 (two) times daily.   aspirin EC 81 MG tablet Take 1 tablet (81 mg total) by mouth daily.   aspirin-acetaminophen-caffeine (EXCEDRIN EXTRA STRENGTH) 250-250-65 MG tablet Take 2 tablets by mouth daily as needed for headache.    butalbital-aspirin-caffeine-codeine (FIORINAL WITH CODEINE) 50-325-40-30 MG capsule TAKE ONE CAPSULE BY MOUTH EVERY 4 HOURS AS NEEDED FOR MIGRAINE   diazepam (VALIUM) 5 MG tablet Take 2.5-5 mg by mouth 2 (two) times daily as needed for anxiety.    ibuprofen (ADVIL,MOTRIN) 200 MG tablet Take 200-400 mg by mouth 2 (two) times daily as needed (for arthritis in the feet).    losartan (COZAAR) 25 MG tablet Take 1 tablet (25 mg total) by mouth daily.   metoprolol succinate (TOPROL-XL) 100 MG 24 hr tablet Take 1 tablet (100 mg total) by mouth daily. Take with or immediately following a meal.   metoprolol tartrate (LOPRESSOR) 25 MG tablet Take 1 tablet (25 mg total) by mouth every 6 (six) hours as needed (Heart Rate >100).   niacin 500 MG tablet Take 500 mg by mouth at bedtime.   rosuvastatin (CRESTOR) 20 MG tablet TAKE ONE TABLET BY MOUTH DAILY   Specialty Vitamins Products (CENTRUM SPECIALIST ENERGY) TABS Take 1 tablet by mouth daily with breakfast.   tadalafil (CIALIS) 5 MG tablet Take 5 mg by mouth daily.   Testosterone (ANDROGEL) 40.5 MG/2.5GM (1.62%) GEL Place 1 application onto the skin daily. Apply 1 pump to each shoulder every morning   Turmeric 500 MG CAPS Take 1,000 mg by mouth daily.    vitamin E 400 UNIT capsule Take 400 Units by mouth at bedtime.    zonisamide (ZONEGRAN) 25 MG capsule TAKE TWO CAPSULES BY MOUTH DAILY    Allergies:   Dog epithelium allergy skin test  and Mushroom extract complex   Social History   Socioeconomic History   Marital status: Divorced    Spouse name: Not on file   Number of children: 0   Years of education: Not on file   Highest education level: Not on file  Occupational History   Occupation: retired  Tobacco Use   Smoking status: Former    Years: 20.00    Pack years: 0.00    Types: Cigarettes    Quit date: 07/22/1986    Years since quitting: 34.4   Smokeless tobacco: Never  Vaping Use   Vaping Use: Never used  Substance and Sexual Activity   Alcohol use: Yes    Alcohol/week: 1.0 standard drink    Types: 1 Glasses of wine per week    Comment: 0-1 daily   Drug use:  No   Sexual activity: Not on file  Other Topics Concern   Not on file  Social History Narrative   Divorced   Secondary school teacher- retired   No children   Has a Neurosurgeon named [name]   Enjoys outdoor activities- hiking, swimming, Control and instrumentation engineer, baseball, writing, reading, cooking   Social Determinants of Radio broadcast assistant Strain: Not on Art therapist Insecurity: Not on file  Transportation Needs: Not on file  Physical Activity: Not on file  Stress: Not on file  Social Connections: Not on file     Family History:  The patient's family history includes Arthritis in his father and mother; Diabetes in his father and mother; Heart disease (age of onset: 79) in his father; Hyperlipidemia in his father and mother; Hypertension in his father; Kidney disease in his paternal grandfather; Stroke (age of onset: 38) in his father. There is no history of Colon cancer, Colon polyps, Esophageal cancer, Stomach cancer, or Rectal cancer.  ROS:   Please see the history of present illness. .  All other systems are reviewed and otherwise negative.    EKGs/Labs/Other Studies Reviewed:    Studies reviewed are outlined and summarized above. Reports included below if pertinent.  2D Echo 08/2019   1. Left ventricular ejection fraction, by estimation, is 55 to 60%. The   left ventricle has normal function. The left ventricle has no regional  wall motion abnormalities. There is mild asymmetric left ventricular  hypertrophy of the basal-septal segment.  Left ventricular diastolic parameters were normal.   2. Right ventricular systolic function is normal. The right ventricular  size is normal. There is normal pulmonary artery systolic pressure. The  estimated right ventricular systolic pressure is 46.9 mmHg.   3. The mitral valve is degenerative. Mild mitral valve regurgitation. No  evidence of mitral stenosis.   4. The aortic valve has an indeterminant number of cusps. Aortic valve  regurgitation is mild to moderate. No aortic stenosis is present. Aortic  regurgitation PHT measures 282 msec.   5. Ascending aortic aneurysm, 48 mm. Aneurysm of the aortic root,  measuring 50 mm.   6. The inferior vena cava is normal in size with greater than 50%  respiratory variability, suggesting right atrial pressure of 3 mmHg.   Comparison(s): A prior study was performed on 08/22/2018. Prior images  unable to be directly viewed, comparison made by report only. No  significant change from prior study. Aortic valve reported to be tricuspid  on prior TEE. Aortic root 50 mm on this  study. Asc Ao 48 mm. CT chest at [facility] from 2020 47 mm. Appears stable per  medical record.   CT Chest 05/2019 FINDINGS:   Thoracic inlet/central airways: Thyroid normal. Airway patent.  Mediastinum/hila/axilla: No adenopathy. Small hiatal hernia with circumferential distal esophageal wall thickening measuring up to 6 mm, slightly increased from 05/27/2018.  Heart/vessels: Postsurgical changes of CABG. Normal heart size. No pericardial effusion. Aneurysmal dilatation of the ascending thoracic aorta measuring up to 4.7 cm, similar to prior. Mild burden atherosclerotic calcifications of the thoracic aorta.   Lungs/pleura: Diffuse subpleural reticulations the lungs. Subsegmental atelectasis in the  right lower lobe. No airspace opacities. No suspicious lung nodule or lung mass identified.   Mild Paraseptal emphysema, Upper lobe predominant.  No pleural effusion, or pneumothorax.   Upper abdomen: Adreniform thickening of the adrenal glands without discrete nodularity. Similar size and appearance of 2.5 cm fluid attenuation cortical lesion in the upper pole of the left kidney  which is most compatible with a cortical simple cyst.   Chest wall/MSK: Healed median sternotomy with intact mid sternotomy wires.  CONCLUSION:    Minimal change in an ascending thoracic aortic aneurysm, measuring 4.7 cm on today's exam.   Circumferential distal and mid esophageal wall thickening, slightly increased from 05/27/2018 suggestive of gastroesophageal reflux/mild esophagitis.   Similar nonspecific interstitial lung disease.     EKG:  EKG was done today and showed NSR at 60bpm with occasional PVCs and no ST changes  Recent Labs: 07/27/2020: ALT 25; BUN 12; Creatinine, Ser 1.28; Potassium 4.6; Sodium 142  Recent Lipid Panel    Component Value Date/Time   CHOL 73 (L) 07/27/2020 0954   TRIG 59 07/27/2020 0954   HDL 40 07/27/2020 0954   CHOLHDL 1.8 07/27/2020 0954   CHOLHDL 2.8 08/22/2018 0645   VLDL 12 08/22/2018 0645   LDLCALC 19 07/27/2020 0954    PHYSICAL EXAM:    VS:  BP 124/62   Pulse (!) 58   Ht 5\' 7"  (1.702 m)   Wt 149 lb (67.6 kg)   SpO2 98%   BMI 23.34 kg/m   BMI: Body mass index is 23.34 kg/m.  GEN: Well nourished, well developed in no acute distress HEENT: Normal NECK: No JVD; No carotid bruits LYMPHATICS: No lymphadenopathy CARDIAC:RRR, no murmurs, rubs, gallops RESPIRATORY:  Clear to auscultation without rales, wheezing or rhonchi  ABDOMEN: Soft, non-tender, non-distended MUSCULOSKELETAL:  No edema; No deformity  SKIN: Warm and dry NEUROLOGIC:  Alert and oriented x 3 PSYCHIATRIC:  Normal affect    Wt Readings from Last 3 Encounters:  12/28/20 149 lb (67.6 kg)   11/15/20 143 lb (64.9 kg)  07/27/20 153 lb 12.8 oz (69.8 kg)     ASSESSMENT & PLAN:   CAD -s/p CABG 1998 -s/p NSTEMI 2020 with Cath 08/2018 showing an occluded left radial graft to the RCA with patent LIMA-LAD with L-R collaterals. Medical therapy was recommended.  -he has not had any anginal symptoms -Continue ASA, BB, statin  2.  Paroxysmal atrial flutter -s/p ablation.  -he is maintaining NSR on exam and denies any palpitations -Amiodarone and Eliquis stopped  without clinical recurrence.   3.  Thoracic aortic aneurysm  -2D echo 08/2019 at 71mm in the ascending aorta and 28mm in the aortic root - this is monitored by [facility], Dr. Clementeen Graham >>scan in Nov 2021 stable per patient -recent 2D echo showed ascending aorta 62mm and aortic root 77mm - reiterated that he needs to abstain from upper body weight lifting -BP is adequately controlled on exam today -Continue BB and statin . 4.  Bicuspid aortic valve  -with mild-moderate aortic insufficency - stable by echo 08/2019.  -2D echo 08/2020 showed mild to moderate AI -repeat echo 08/2021  5.  HLD -LDL goal < 70 -LDL was 19 c and ALT 25 in Jan 2022 -Continue prescription drug management with Crestor 20mg  daily>>refilled for 1 year today   6.  HTN -BP is well controlled on exam today -Continue prescription drug management with Amlodipine 10mg  daily and Losartan 25mg  daily>>refilled for 1 year today -he is having problems with erectile dysfunction on Toprol so I will stop his Toprol and start Bystolic 10mg  daily ( he is also seeing urology). -SCr 1.28 and K+ 4.6 in Jan 2021 -repeat BMET today  Medication Adjustments/Labs and Tests Ordered: Current medicines are reviewed at length with the patient today.  Concerns regarding medicines are outlined above. Medication changes, Labs and Tests ordered  today are summarized above and listed in the Patient Instructions accessible in Encounters.   Signed, Drew Him, MD  12/28/2020  11:43 AM    [facility] [address], [place], [place]  [address] Phone: [phone]; Fax: [phone]

## 2020-12-28 NOTE — Addendum Note (Signed)
Addended by: Antonieta Iba on: 12/28/2020 12:01 PM   Modules accepted: Orders

## 2020-12-28 NOTE — Patient Instructions (Signed)
Medication Instructions:  Your physician has recommended you make the following change in your medication: 1) STOP taking Toprol XL (metoprolol succinate) 2) START taking Bystolic 10 mg daily *If you need a refill on your cardiac medications before your next appointment, please call your pharmacy*   Lab Work: TODAY: BMET If you have labs (blood work) drawn today and your tests are completely normal, you will receive your results only by: [place] (if you have MyChart) OR A paper copy in the mail If you have any lab test that is abnormal or we need to change your treatment, we will call you to review the results.   Testing/Procedures: Your physician has requested that you have an echocardiogram in February 2023. Echocardiography is a painless test that uses sound waves to create images of your heart. It provides your doctor with information about the size and shape of your heart and how well your heart's chambers and valves are working. This procedure takes approximately one hour. There are no restrictions for this procedure.  Follow-Up: At [facility], you and your health needs are our priority.  As part of our continuing mission to provide you with exceptional heart care, we have created designated Provider Care Teams.  These Care Teams include your primary Cardiologist (physician) and Advanced Practice Providers (APPs -  Physician Assistants and Nurse Practitioners) who all work together to provide you with the care you need, when you need it.  Your next appointment:   1 year(s)  The format for your next appointment:   In Person  Provider:   You may see Fransico Him, MD or one of the following Advanced Practice Providers on your designated Care Team:   Melina Copa, PA-C Ermalinda Barrios, PA-C

## 2020-12-28 NOTE — Addendum Note (Signed)
Addended by: Antonieta Iba on: 12/28/2020 11:55 AM   Modules accepted: Orders

## 2021-01-01 ENCOUNTER — Encounter: Payer: Self-pay | Admitting: Neurology

## 2021-01-01 NOTE — Telephone Encounter (Signed)
Hello [name],   I have searched in Epic for your HST results_ no success. Did you drop the HST off at our office last week? Or was this a mail -In test device ?  I have not seen the data arriving on my desk top- just wanting to make sure which type of device was used for the HST.  If you are going to [place], and want to have a beach day , try the [place] !   I am sweating a bit over the letter to the [place] as you have not been using high or moderate doses of opiates for a while - but you were on these medications when you still had tested for central apnea. so I can't really state the opiates cause central apnea if we still find central apnea now- and that means the HST has to be recovered. .  You are only using prn BUTALBUTAL now.   Larey Seat, MD

## 2021-01-02 NOTE — Progress Notes (Signed)
[facility] (Watch PAT) REPORT  STUDY DATE:12-25-20- downloaded 01-03-2021  DOB: March 27, 1946  MRN: [id number]  ORDERING CLINICIAN: Larey Seat, MD   REFERRING CLINICIAN: Nance Pew, Laurene Footman, MD   CLINICAL INFORMATION/HISTORY: [name] is a meanwhile 75 year old Caucasian gentleman whom I have followed now for about 5 years. Mr. Furr reports that his headache has been much better under control he is actually very happy about this aspect of his neurological health.  He has lost some weight since December 2021 and his BMI is now 22.4.  He is using zonisamide as a headache preventative and for acute breakthrough headaches he will use the occasional Fiorinal. 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  He certainly was exposed to very loud noises.  His sleep has changed since that.   during [employer] service he was treated with heavy narcotic medication- he used to use [employer], Talwin, Darvin, Darvocet, fentanyl, Percodan, morphine, Percocet, oxycodone, hydrocodone, Demerol injections, and no Fiorinal as needed so there have been various opiates prescribed, time of 30 years ago but currently he is only on Fiorinal as needed and a prescription lasts him for over 90 days.  No history of opiate addiction.  Epworth sleepiness score: 1/24.  BMI: 22.5 kg/m  FINDINGS:   Sleep Summary:   Total Recording Time (hours, min): 9 h 11 min  Total Sleep Time (hours, min):  8 h 35 min   Percent REM (%):    3.78%  Respiratory Indices:   Calculated pAHI (per hour):  44.1/hour        REM pAHI:    N/A/hour      NREM pAHI: N/A/hour  Oxygen Saturation Statistics:    Oxygen Saturation (%) Mean: 93%  Minimum oxygen saturation (%):                 80%   O2 Saturation (minutes) <90%: 2.5 min  Pulse Rate Statistics:   Pulse Mean (bpm):   46/min   Pulse Range was 46-83 bpm    IMPRESSION: This HST still indicated severe OSA (obstructive sleep apnea) , minor hypoxemia- clinically not relevant at this level. Sleep Position has little influence on apnea severity.    RECOMMENDATION: This degree of sleep apnea requires therapy- he has been using a CPAP machine that is now 73 years old and needs to be replaced.  REC: further treatment with PAP ( positive airway pressure ) auto setting 6 through 16 cm water,3 cm EPR and mask of his choice.     INTERPRETING PHYSICIAN:  Larey Seat, MD    [facility] and [facility] Sleep Board certified by The [employer] of Sleep Medicine and Diplomate of the [employer] of Sleep Medicine. Board certified In Neurology through the [place], Fellow of the [employer] of Neurology. Medical Director of [employer].  Sleep Summary   Start Study Time: End Study Time: Total Recording Time:       11:30:24 PM 8:41:38 AM 9 h, 11 min  Total Sleep Time % REM of Sleep Time:  8 h, 35 min  3.8     Respiratory Indices     Total Events REM NREM All Night  pRDI: pAHI 3%: ODI 4%: pAHIc 3%: % CSR: pAHI 4%:  219  214  120  53 18.6 165 N/A N/A N/A N/A N/A N/A N/A N/A 45.1 44.1  24.7 10.9 34.0     Oxygen Saturation Statistics   Mean: 93 Minimum: 80 Maximum: 100  Mean of Desaturations Nadirs (%):   91  Oxygen Desat. %:  4-9 10-20 >20 Total  Events Number Total   117  3 97.5 2.5  0 0.0  120 100.0  Oxygen Saturation: <90 <=88 <85 <80 <70  Duration (minutes): Sleep % 9.1 1.8 2.5 0.1 0.5 0.0 0.0 0.0 0.0 0.0     Pulse Rate Statistics during Sleep (BPM)     Mean: 46 Minimum: N/A Maximum: 83      Body Position Statistics  Position Supine Prone Right Left Non-Supine  Sleep (min) 200.5 1.0 18.0 296.0 315.0  Sleep % 38.9 0.2 3.5 57.4 61.1  pRDI 60.7 N/A 38.9 34.8 35.3  pAHI 3% 60.7 N/A 38.9 33.0 33.6  ODI 4% 35.7 N/A 29.2 16.7 17.8   Left   Prone Right Supine   Snoring Statistics Snoring Level (dB) >40 >50 >60 >70 >80 >Threshold (45)  Sleep (min) 178.7 28.0 4.3 0.0 0.0 70.9  Sleep % 34.7 5.4 0.8 0.0 0.0 13.8

## 2021-01-03 NOTE — Procedures (Signed)
[facility] TEST (Watch PAT) REPORT  STUDY DATE:12-25-20- downloaded 01-03-2021  DOB: 04-Aug-1945  MRN: [id number]  ORDERING CLINICIAN: Larey Seat, MD   REFERRING CLINICIAN: Nance Pew, Laurene Footman, MD   CLINICAL INFORMATION/HISTORY: [name] is a meanwhile 75 year old Caucasian gentleman whom I have followed now for about 5 years. Mr. Liendo reports that his headache has been much better under control he is actually very happy about this aspect of his neurological health.  He has lost some weight since December 2021 and his BMI is now 22.4.  He is using zonisamide as a headache preventative and for acute breakthrough headaches he will use the occasional Fiorinal. 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  He certainly was exposed to very loud noises.  His sleep has changed since that.   during [employer] service he was treated with heavy narcotic medication- he used to use [employer], Talwin, Darvin, Darvocet, fentanyl, Percodan, morphine, Percocet, oxycodone, hydrocodone, Demerol injections, and no Fiorinal as needed so there have been various opiates prescribed, time of 30 years ago but currently he is only on Fiorinal as needed and a prescription lasts him for over 90 days.  No history of opiate addiction.  Epworth sleepiness score: 1/24.  BMI: 22.5 kg/m  FINDINGS:   Sleep Summary:   Total Recording Time (hours, min): 9 h 11 min  Total Sleep Time (hours, min):  8 h 35 min   Percent REM (%):    3.78%  Respiratory Indices:   Calculated pAHI (per hour):  44.1/hour        REM pAHI:    N/A/hour      NREM pAHI: N/A/hour  Oxygen Saturation Statistics:    Oxygen Saturation (%) Mean: 93%  Minimum oxygen saturation (%):                 80%   O2 Saturation (minutes) <90%: 2.5 min  Pulse Rate Statistics:   Pulse Mean (bpm):  46/min   Pulse  Range was 46-83 bpm    IMPRESSION: This HST still indicated severe OSA (obstructive sleep apnea) , minor hypoxemia- clinically not relevant at this level. Sleep Position has little influence on apnea severity.    RECOMMENDATION: This degree of sleep apnea requires therapy- he has been using a CPAP machine that is now 73 years old and needs to be replaced.  REC: further treatment with PAP ( positive airway pressure ) auto setting 6 through 16 cm water,3 cm EPR and mask of his choice.     INTERPRETING PHYSICIAN:  Larey Seat, MD    [facility] and [facility] Sleep Board certified by The [employer] of Sleep Medicine and Diplomate of the [employer] of Sleep Medicine. Board certified In Neurology through the [place], Fellow of the [employer] of Neurology. Medical Director of [employer].  Sleep Summary   Start Study Time: End Study Time: Total Recording Time:       11:30:24 PM 8:41:38 AM 9 h, 11 min  Total Sleep Time % REM of Sleep Time:  8 h, 35 min  3.8     Respiratory Indices     Total Events REM NREM All Night  pRDI: pAHI 3%: ODI 4%: pAHIc 3%: % CSR: pAHI 4%:  219  214  120  53 18.6 165 N/A N/A N/A N/A N/A N/A N/A N/A 45.1 44.1 24.7  10.9 34.0     Oxygen Saturation Statistics   Mean: 93 Minimum: 80 Maximum: 100  Mean of Desaturations Nadirs (%):   91  Oxygen Desat. %:  4-9 10-20 >20 Total  Events Number Total   117  3 97.5 2.5  0 0.0  120 100.0  Oxygen Saturation: <90 <=88 <85 <80 <70  Duration (minutes): Sleep % 9.1 1.8 2.5 0.1 0.5 0.0 0.0 0.0 0.0 0.0     Pulse Rate Statistics during Sleep (BPM)     Mean: 46 Minimum: N/A Maximum: 83      Body Position Statistics  Position Supine Prone Right Left Non-Supine  Sleep (min) 200.5 1.0 18.0 296.0 315.0  Sleep % 38.9 0.2 3.5 57.4 61.1  pRDI 60.7 N/A 38.9 34.8 35.3  pAHI 3% 60.7 N/A 38.9 33.0 33.6  ODI 4% 35.7 N/A 29.2 16.7 17.8   Left   Prone Right Supine   Snoring Statistics Snoring Level (dB) >40 >50 >60 >70 >80 >Threshold (45)  Sleep (min) 178.7 28.0 4.3 0.0 0.0 70.9  Sleep % 34.7 5.4 0.8 0.0 0.0 13.8

## 2021-01-03 NOTE — Addendum Note (Signed)
Addended by: Larey Seat on: 01/03/2021 05:28 PM   Modules accepted: Orders

## 2021-01-03 NOTE — Progress Notes (Signed)
Calculated pAHI (per hour):    44.1/hour    IMPRESSION: This HST still indicated severe OSA (obstructive sleep apnea) , minor hypoxemia- clinically not relevant at this level. Sleep Position has little influence on apnea severity.  RECOMMENDATION: This degree of sleep apnea requires therapy- he has been using a CPAP machine that is now 73 years old and needs to be replaced.  REC: further treatment with PAP ( positive airway pressure ) auto setting 6 through 16 cm water,3 cm EPR and mask of his choice.

## 2021-01-04 ENCOUNTER — Encounter: Payer: Self-pay | Admitting: Neurology

## 2021-01-05 NOTE — Telephone Encounter (Signed)
This degree of apnea exceeds what [name] can do. Literature about hypoglossal nerve stimulation reports a success rate of reducing purely obstructive apnea by 72% average-  that's is far less then he can expect from CPAP.  It would leave him still with 12-15 AHI.

## 2021-03-06 ENCOUNTER — Encounter (INDEPENDENT_AMBULATORY_CARE_PROVIDER_SITE_OTHER): Payer: Medicare Other | Admitting: Ophthalmology

## 2021-03-26 ENCOUNTER — Encounter: Payer: Self-pay | Admitting: Adult Health

## 2021-04-03 ENCOUNTER — Other Ambulatory Visit: Payer: Self-pay | Admitting: Neurology

## 2021-04-18 ENCOUNTER — Encounter (INDEPENDENT_AMBULATORY_CARE_PROVIDER_SITE_OTHER): Payer: Self-pay | Admitting: Ophthalmology

## 2021-04-18 ENCOUNTER — Other Ambulatory Visit: Payer: Self-pay

## 2021-04-18 ENCOUNTER — Ambulatory Visit (INDEPENDENT_AMBULATORY_CARE_PROVIDER_SITE_OTHER): Payer: Medicare Other | Admitting: Ophthalmology

## 2021-04-18 DIAGNOSIS — H35073 Retinal telangiectasis, bilateral: Secondary | ICD-10-CM | POA: Diagnosis not present

## 2021-04-18 DIAGNOSIS — I2510 Atherosclerotic heart disease of native coronary artery without angina pectoris: Secondary | ICD-10-CM

## 2021-04-18 DIAGNOSIS — H353132 Nonexudative age-related macular degeneration, bilateral, intermediate dry stage: Secondary | ICD-10-CM | POA: Diagnosis not present

## 2021-04-18 NOTE — Assessment & Plan Note (Signed)
Onset of findings 2016, patient subsequently pursued sleep apnea evaluation with Dr. Asencion Partridge domicile and found to have sleep apnea.  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  Nonetheless no overt active thickening by OCT  I have recommended patient have lens fluorescein angiography to study the perfusion particularly since he has also concomitant unrelated macular degeneration for which treatment of sleep apnea induced nightly macular hypoxia by CPAP may slow natural progression

## 2021-04-18 NOTE — Progress Notes (Signed)
04/18/2021     CHIEF COMPLAINT Patient presents for  Chief Complaint  Patient presents with   Retina Follow Up      HISTORY OF PRESENT ILLNESS: [name] is a 75 y.o. male who presents to the clinic today for:   HPI     Retina Follow Up   Patient presents with  Other.  In both eyes.  This started 1 year ago.  Severity is mild.  Duration of 1 year.  Since onset it is stable.        Comments   1 year fu OU and OCT Pt states [facility] OU stable since last visit. Pt denies FOL, floaters, or ocular pain OU.  Pt states, "I do not have any complaints. I feel like everything is good."      Last edited by Kendra Opitz, COA on 04/18/2021  8:07 AM.      Referring physician: Warden Fillers, MD [address] [place],  [place] [address]  HISTORICAL INFORMATION:   Selected notes from the MEDICAL RECORD NUMBER    Lab Results  Component Value Date   HGBA1C 5.9 (H) 08/22/2018     CURRENT MEDICATIONS: No current outpatient medications on file. (Ophthalmic Drugs)   No current facility-administered medications for this visit. (Ophthalmic Drugs)   Current Outpatient Medications (Other)  Medication Sig   acetaminophen (TYLENOL) 325 MG tablet Take 650 mg by mouth every 6 (six) hours as needed for mild pain or moderate pain.   ALPRAZOLAM XR 1 MG 24 hr tablet TAKE ONE TABLET BY MOUTH DAILY   amLODipine (NORVASC) 10 MG tablet Take 1 tablet (10 mg total) by mouth daily.   Ascorbic Acid (VITAMIN C) 1000 MG tablet Take 1,000 mg by mouth 2 (two) times daily.   aspirin EC 81 MG tablet Take 1 tablet (81 mg total) by mouth daily.   aspirin-acetaminophen-caffeine (EXCEDRIN MIGRAINE) 250-250-65 MG tablet Take 2 tablets by mouth daily as needed for headache.    butalbital-aspirin-caffeine-codeine (FIORINAL WITH CODEINE) 50-325-40-30 MG capsule Prn use for break through migraine   diazepam (VALIUM) 5 MG tablet Take 2.5-5 mg by mouth 2 (two) times daily as needed for anxiety.     ibuprofen (ADVIL,MOTRIN) 200 MG tablet Take 200-400 mg by mouth 2 (two) times daily as needed (for arthritis in the feet).    losartan (COZAAR) 25 MG tablet Take 1 tablet (25 mg total) by mouth daily.   nebivolol (BYSTOLIC) 10 MG tablet Take 1 tablet (10 mg total) by mouth daily.   niacin 500 MG tablet Take 500 mg by mouth at bedtime.   rosuvastatin (CRESTOR) 20 MG tablet Take 1 tablet (20 mg total) by mouth daily.   Specialty Vitamins Products (CENTRUM SPECIALIST ENERGY) TABS Take 1 tablet by mouth daily with breakfast.   Testosterone 40.5 MG/2.5GM (1.62%) GEL Place 1 application onto the skin daily. Apply 2 pumps to each shoulder every morning   traZODone (DESYREL) 50 MG tablet Take 1 tablet (50 mg total) by mouth at bedtime.   Turmeric 500 MG CAPS Take 1,000 mg by mouth daily.    vitamin E 400 UNIT capsule Take 400 Units by mouth at bedtime.    Current Facility-Administered Medications (Other)  Medication Route   0.9 %  sodium chloride infusion Intravenous   0.9 %  sodium chloride infusion Intravenous      REVIEW OF SYSTEMS:    ALLERGIES Allergies  Allergen Reactions   Dog Epithelium Allergy Skin Test Itching,  Anxiety, Palpitations and Other (See Comments)    Other reaction(s): Respiratory Distress (ALLERGY/intolerance), Wheezing (ALLERGY/intolerance)   Mushroom Extract Complex Nausea Only and Other (See Comments)    Severe vertigo and headaches    PAST MEDICAL HISTORY Past Medical History:  Diagnosis Date   Anal fissure    Aortic insufficiency    Arthritis    Atrial flutter (HCC)    Bicuspid aortic valve    with mild to moderate AR by echo 08/2019   BPH (benign prostatic hyperplasia)    CAD (coronary artery disease)    s/p CABG [place]   Cataract    bilateral-removed   CKD (chronic kidney disease), stage III ([place])    Depression    Dilated aortic root (HCC)    History of blood transfusion 1998   "due to cardiac bypass surgery"   Hyperlipidemia     Hypertension    Hypogonadism in male    Kidney stones    Migraine    Mild carotid artery disease ([place]) 2009   Myocardial infarction ([place])    PVC's (premature ventricular contractions)    Retinal hemorrhage 11/04/2014   Sleep apnea with use of continuous positive airway pressure (CPAP)    Thoracic aortic aneurysm (TAA) ([place])    Past Surgical History:  Procedure Laterality Date   A-FLUTTER ABLATION N/A 09/29/2019   Procedure: A-FLUTTER ABLATION;  Surgeon: Constance Haw, MD;  Location: [facility];  Service: Cardiovascular;  Laterality: N/A;   CARDIOVERSION N/A 08/10/2019   Procedure: CARDIOVERSION;  Surgeon: Elouise Munroe, MD;  Location: [facility] ENDOSCOPY;  Service: Cardiovascular;  Laterality: N/A;   cataracts     COLONOSCOPY     CORONARY ARTERY BYPASS GRAFT  1998   LEFT HEART CATH AND CORONARY ANGIOGRAPHY N/A 08/24/2018   Procedure: LEFT HEART CATH AND CORONARY ANGIOGRAPHY;  Surgeon: Lorretta Harp, MD;  Location: [facility];  Service: Cardiovascular;  Laterality: N/A;   TEE WITHOUT CARDIOVERSION N/A 01/19/2015   Procedure: TRANSESOPHAGEAL ECHOCARDIOGRAM (TEE);  Surgeon: Sueanne Margarita, MD;  Location: [facility] ENDOSCOPY;  Service: Cardiovascular;  Laterality: N/A;   [place]   pt denies    FAMILY HISTORY Family History  Problem Relation Age of Onset   Arthritis Mother        deceased   Hyperlipidemia Mother    Diabetes Mother    Arthritis Father    Hyperlipidemia Father    Heart disease Father 23   Stroke Father 57       deceased   Hypertension Father    Diabetes Father    Kidney disease Paternal Grandfather    Colon cancer Neg Hx    Colon polyps Neg Hx    Esophageal cancer Neg Hx    Stomach cancer Neg Hx    Rectal cancer Neg Hx     SOCIAL HISTORY Social History   Tobacco Use   Smoking status: Former    Years: 20.00    Types: Cigarettes    Quit date: 07/22/1986    Years since quitting: 34.7   Smokeless tobacco: Never  Vaping  Use   Vaping Use: Never used  Substance Use Topics   Alcohol use: Yes    Alcohol/week: 1.0 standard drink    Types: 1 Glasses of wine per week    Comment: 0-1 daily   Drug use: No         OPHTHALMIC EXAM:  Base Eye Exam     Visual Acuity (  ETDRS)       Right Left   Dist [place] 20/50 -1 20/40   Dist ph [place] 20/40 20/30 -1         Tonometry (Tonopen, 8:11 AM)       Right Left   Pressure 11 15         Pupils       Pupils Shape React APD   Right PERRL Round Brisk None   Left PERRL Round Brisk None         Visual Fields (Counting fingers)       Left Right    Full Full         Extraocular Movement       Right Left    Full Full         Neuro/Psych     Oriented x3: Yes         Dilation     Both eyes: 1.0% Mydriacyl, 2.5% Phenylephrine @ 8:11 AM           Slit Lamp and Fundus Exam     External Exam       Right Left   External Normal Normal         Slit Lamp Exam       Right Left   Lids/Lashes Normal Normal   Conjunctiva/Sclera White and quiet White and quiet   Cornea Clear Clear   Anterior Chamber Deep and quiet Deep and quiet   Iris Round and reactive Round and reactive   Lens Centered posterior chamber intraocular lens Centered posterior chamber intraocular lens   Anterior Vitreous Normal Normal         Fundus Exam       Right Left   Posterior Vitreous Normal Normal   Disc Normal Normal   C/D Ratio 0.1 0.15   Macula Intermediate age related macular degeneration, no cystoid macular edema Intermediate age related macular degeneration, focal hyperpigmentation superior faz, no cystoid macular edema   Vessels Normal Normal   Periphery Normal Normal            IMAGING AND PROCEDURES  Imaging and Procedures for 04/18/21  OCT, Retina - OU - Both Eyes       Right Eye Quality was good. Scan locations included subfoveal. Central Foveal Thickness: 235. Progression has been stable. Findings include subretinal  hyper-reflective material, vitreomacular adhesion .   Left Eye Quality was good. Scan locations included subfoveal. Central Foveal Thickness: 155. Progression has improved. Findings include subretinal hyper-reflective material, outer retinal atrophy, vitreomacular adhesion .   Notes OD with progressive cavitary atrophy temporal portion of FAZ yet intact outer retina  OS with outer retinal distortion and now collapse of the outer retinal cavitary area or serous detachment noted some 6 years previous.             ASSESSMENT/PLAN:  Type 2 macular telangiectasis, bilateral Onset of findings 2016, patient subsequently pursued sleep apnea evaluation with Dr. Asencion Partridge domicile and found to have sleep apnea.  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  Nonetheless no overt active thickening by OCT  I have recommended patient have lens fluorescein angiography to study the perfusion particularly since he has also concomitant unrelated macular degeneration for which treatment of sleep apnea induced nightly macular hypoxia  by CPAP may slow natural progression  Intermediate stage nonexudative age-related macular degeneration of both eyes The nature of age--related macular degeneration was discussed with the patient as well as the distinction between dry and wet types. Checking an Amsler Grid daily with advice to return immediately should a distortion develop, was given to the patient. The patient 's smoking status now and in the past was determined and advice based on the AREDS study was provided regarding the consumption of antioxidant supplements. AREDS 2 vitamin formulation was recommended. Consumption of dark leafy vegetables and fresh fruits of various colors was recommended. Treatment  modalities for wet macular degeneration particularly the use of intravitreal injections of anti-blood vessel growth factors was discussed with the patient. Avastin, Lucentis, and Eylea are the available options. On occasion, therapy includes the use of photodynamic therapy and thermal laser. Stressed to the patient do not rub eyes.  Patient was advised to check Amsler Grid daily and return immediately if changes are noted. Instructions on using the grid were given to the patient. All patient questions were answered.     ICD-10-CM   1. Retinal telangiectasia of both eyes  H35.073 OCT, Retina - OU - Both Eyes    2. Intermediate stage nonexudative age-related macular degeneration of both eyes  H35.3132 OCT, Retina - OU - Both Eyes    3. Type 2 macular telangiectasis, bilateral  H35.073       1.  OU relatively stable from [place].  Patient continues on CPAP.  2.  Patient offered up gratitude for commencing of investigation to look for sleep apnea in his case.  As his review of systems at that time 6 years ago were positive yet his typical body habitus does not fit the criteria, meaning a proper lean body mass, no enlarged neck anatomy, etc.  3.  Ophthalmic Meds Ordered this visit:  No orders of the defined types were placed in this encounter.      Return in about 3 weeks (around 05/09/2021) for DILATE OU, OPTOS FFA R/L, COLOR FP.  There are no Patient Instructions on file for this visit.   Explained the diagnoses, plan, and follow up with the patient and they expressed understanding.  Patient expressed understanding of the importance of proper follow up care.   Clent Demark Vignesh Willert M.D. Diseases & Surgery of the Retina and Vitreous Retina & Diabetic [place] 04/18/21     Abbreviations: M myopia (nearsighted); A astigmatism; H hyperopia (farsighted); P presbyopia; Mrx spectacle prescription;  CTL contact lenses; OD right eye; OS left eye; OU both eyes  XT exotropia; ET esotropia; PEK  punctate epithelial keratitis; PEE punctate epithelial erosions; DES dry eye syndrome; MGD meibomian gland dysfunction; ATs artificial tears; PFAT's preservative free artificial tears; [place] nuclear sclerotic cataract; PSC posterior subcapsular cataract; ERM epi-retinal membrane; PVD posterior vitreous detachment; RD retinal detachment; DM diabetes mellitus; DR diabetic retinopathy; NPDR non-proliferative diabetic retinopathy; PDR proliferative diabetic retinopathy; CSME clinically significant macular edema; DME diabetic macular edema; dbh dot blot hemorrhages; CWS cotton wool spot; POAG primary open angle glaucoma; C/D cup-to-disc ratio; HVF humphrey visual field; GVF goldmann visual field; OCT optical coherence tomography; IOP intraocular pressure; BRVO Branch retinal vein occlusion; CRVO central retinal vein occlusion; CRAO central retinal artery occlusion; BRAO branch retinal artery occlusion; RT retinal tear; SB scleral buckle; PPV pars plana vitrectomy; VH Vitreous hemorrhage; PRP panretinal laser photocoagulation; IVK intravitreal kenalog; VMT vitreomacular traction; MH Macular hole;  NVD neovascularization of the disc; NVE neovascularization elsewhere; AREDS age  related eye disease study; ARMD age related macular degeneration; POAG primary open angle glaucoma; EBMD epithelial/anterior basement membrane dystrophy; ACIOL anterior chamber intraocular lens; IOL intraocular lens; PCIOL posterior chamber intraocular lens; Phaco/IOL phacoemulsification with intraocular lens placement; [place] photorefractive keratectomy; LASIK laser assisted in situ keratomileusis; HTN hypertension; DM diabetes mellitus; COPD chronic obstructive pulmonary disease

## 2021-04-18 NOTE — Assessment & Plan Note (Signed)

## 2021-04-27 ENCOUNTER — Other Ambulatory Visit: Payer: Self-pay

## 2021-04-27 ENCOUNTER — Ambulatory Visit (INDEPENDENT_AMBULATORY_CARE_PROVIDER_SITE_OTHER): Payer: Medicare Other | Admitting: Cardiovascular Disease

## 2021-04-27 ENCOUNTER — Encounter: Payer: Self-pay | Admitting: Cardiovascular Disease

## 2021-04-27 ENCOUNTER — Telehealth: Payer: Self-pay | Admitting: Cardiology

## 2021-04-27 VITALS — BP 134/72 | HR 93 | Ht 67.0 in | Wt 146.6 lb

## 2021-04-27 DIAGNOSIS — I4891 Unspecified atrial fibrillation: Secondary | ICD-10-CM | POA: Diagnosis not present

## 2021-04-27 DIAGNOSIS — I7121 Aneurysm of the ascending aorta, without rupture: Secondary | ICD-10-CM | POA: Diagnosis not present

## 2021-04-27 DIAGNOSIS — I10 Essential (primary) hypertension: Secondary | ICD-10-CM

## 2021-04-27 DIAGNOSIS — I2510 Atherosclerotic heart disease of native coronary artery without angina pectoris: Secondary | ICD-10-CM

## 2021-04-27 MED ORDER — APIXABAN 5 MG PO TABS: 5.0000 mg | ORAL_TABLET | Freq: Two times a day (BID) | ORAL | 11 refills | Status: AC

## 2021-04-27 MED ORDER — NEBIVOLOL HCL 20 MG PO TABS: 20.0000 mg | ORAL_TABLET | Freq: Every day | ORAL | 3 refills | Status: DC

## 2021-04-27 MED ORDER — APIXABAN 5 MG PO TABS: 5.0000 mg | ORAL_TABLET | Freq: Two times a day (BID) | ORAL | 11 refills | Status: DC
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
## 2021-04-27 NOTE — Patient Instructions (Addendum)
Medication Instructions:  1) START Eliquis 5mg  twice daily 2) INCREASE Nebivolol to 20mg  once daily 3) DISCONTINUE Aspirin  *If you need a refill on your cardiac medications before your next appointment, please call your pharmacy*   Lab Work: BMET, CBC and TSH today  If you have labs (blood work) drawn today and your tests are completely normal, you will receive your results only by: [place] (if you have MyChart) OR A paper copy in the mail If you have any lab test that is abnormal or we need to change your treatment, we will call you to review the results.   Testing/Procedures: None   Follow-Up: At [facility], you and your health needs are our priority.  As part of our continuing mission to provide you with exceptional heart care, we have created designated Provider Care Teams.  These Care Teams include your primary Cardiologist (physician) and Advanced Practice Providers (APPs -  Physician Assistants and Nurse Practitioners) who all work together to provide you with the care you need, when you need it.  We recommend signing up for the patient portal called "MyChart".  Sign up information is provided on this After Visit Summary.  MyChart is used to connect with patients for Virtual Visits (Telemedicine).  Patients are able to view lab/test results, encounter notes, upcoming appointments, etc.  Non-urgent messages can be sent to your provider as well.   To learn more about what you can do with MyChart, go to [email or web address].    Your next appointment:   2 week(s)  The format for your next appointment:   In Person  Provider:   You will follow up in the [facility] located at [facility]. Your provider will be: Roderic Palau, NP or Clint R. Fenton, PA-C  Your physician recommends that you schedule a follow-up appointment in: 3 months with Dr. Radford Pax.  Other Instructions

## 2021-04-27 NOTE — Telephone Encounter (Signed)
Patient c/o Palpitations:  High priority if patient c/o lightheadedness, shortness of breath, or chest pain  How long have you had palpitations/irregular HR/ Afib? Are you having the symptoms now? YES  Are you currently experiencing lightheadedness, SOB or CP? NO TO ALL  Do you have a history of afib (atrial fibrillation) or irregular heart rhythm? YES  Have you checked your BP or HR? (document readings if available): NO BP CUFF, HR 85-140  Are you experiencing any other symptoms? NO OTHER SYMPTOMS AS FAR AS HE KNOWS

## 2021-04-27 NOTE — Telephone Encounter (Signed)
Last 3 days pt has been having palpitations, irregular heart beat.  No BP cuff. This is the first time he has felt these since a flutter ablation in 2021.  No SOB or pain.  Has been quite tired over last few days.  Had been feeling well since ablation otherwise.   He is off amiodarone and Eliquis.  HR 60-140 this am.  Takes BB at night.  He doesn't know his BP. I have added him to DOD schedule today.  He is grateful for assistance.

## 2021-04-28 LAB — CBC
Hematocrit: 43.9 % (ref 37.5–51.0)
Hemoglobin: 15.2 g/dL (ref 13.0–17.7)
MCH: 34.8 pg — ABNORMAL HIGH (ref 26.6–33.0)
MCHC: 34.6 g/dL (ref 31.5–35.7)
MCV: 101 fL — ABNORMAL HIGH (ref 79–97)
Platelets: 246 10*3/uL (ref 150–450)
RBC: 4.37 x10E6/uL (ref 4.14–5.80)
RDW: 12 % (ref 11.6–15.4)
WBC: 8.9 10*3/uL (ref 3.4–10.8)

## 2021-04-28 LAB — BASIC METABOLIC PANEL
BUN/Creatinine Ratio: 11 (ref 10–24)
BUN: 15 mg/dL (ref 8–27)
CO2: 22 mmol/L (ref 20–29)
Calcium: 8.7 mg/dL (ref 8.6–10.2)
Chloride: 107 mmol/L — ABNORMAL HIGH (ref 96–106)
Creatinine, Ser: 1.32 mg/dL — ABNORMAL HIGH (ref 0.76–1.27)
Glucose: 104 mg/dL — ABNORMAL HIGH (ref 70–99)
Potassium: 4.3 mmol/L (ref 3.5–5.2)
Sodium: 145 mmol/L — ABNORMAL HIGH (ref 134–144)
eGFR: 57 mL/min/{1.73_m2} — ABNORMAL LOW (ref >59)

## 2021-04-28 LAB — TSH: TSH: 2.76 u[IU]/mL (ref 0.450–4.500)

## 2021-05-07 ENCOUNTER — Ambulatory Visit (INDEPENDENT_AMBULATORY_CARE_PROVIDER_SITE_OTHER): Payer: Medicare Other

## 2021-05-07 ENCOUNTER — Other Ambulatory Visit: Payer: Self-pay

## 2021-05-07 ENCOUNTER — Ambulatory Visit (INDEPENDENT_AMBULATORY_CARE_PROVIDER_SITE_OTHER): Payer: Medicare Other | Admitting: Orthopedic Surgery

## 2021-05-07 ENCOUNTER — Encounter: Payer: Self-pay | Admitting: Orthopedic Surgery

## 2021-05-07 DIAGNOSIS — M79641 Pain in right hand: Secondary | ICD-10-CM | POA: Diagnosis not present

## 2021-05-07 DIAGNOSIS — M79642 Pain in left hand: Secondary | ICD-10-CM

## 2021-05-07 DIAGNOSIS — M152 Bouchard's nodes (with arthropathy): Secondary | ICD-10-CM

## 2021-05-07 DIAGNOSIS — M180 Bilateral primary osteoarthritis of first carpometacarpal joints: Secondary | ICD-10-CM | POA: Diagnosis not present

## 2021-05-07 NOTE — Progress Notes (Signed)
Office Visit Note   Patient: [name]           Date of Birth: 1946/04/14           MRN: [id number] Visit Date: 05/07/2021              Requested by: Burman Freestone, MD [address] [place],  [place] [address] PCP: Burman Freestone, MD   Assessment & Plan: Visit Diagnoses:  1. Pain in both hands   2. 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  His left index finger is the bigger issue for him, however, it is mildly symptomatic at this time.  He still has very good range of motion at this joint.  He has tried Voltaren gel for his feet but has not been on his hands.  He is unsure if he is able to take NSAIDs given his cardiac history but will reach out to his cardiologist for further clarification.  We discussed that ultimately the the and treatment for arthritis involving index finger at the PIP joint and involves arthrodesis of the joint.  He will be trading a mildly painful but functional PIP joint for a pain-free but stiff PIP joint.  At this point, he does not think his symptoms are bad enough to pursue any treatment.  Despite the clinical deformity and radiographic appearance, he thumbs are asymptomatic.  He is going to follow-up with me as needed.  Follow-Up Instructions: No follow-ups on file.   Orders:  Orders Placed This Encounter  Procedures   XR Hand Complete Right   XR Hand Complete Left   No orders of the defined types were placed in this encounter.     Procedures: No procedures performed   Clinical Data: No additional findings.   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  He has had pain in his left index finger at the PIP joint for 10+ years.  He denies any injury that he knows of.  This pain does not seem to bother him every day and is 3 out of 10 at worst.  He still has good range of motion of this joint.  He takes Voltaren gel for midfoot arthritis but has not tried it on his hand.  He does not take any NSAIDs given he has a cardiac history and is on some sort of blood thinner.  His index finger seem to be more of a nuisance right now than a particularly painful problem.   Review of Systems   Objective: Vital Signs: There were no vitals taken for this visit.  Physical Exam Constitutional:      Appearance: Normal appearance.  Cardiovascular:     Rate and Rhythm: Normal rate.     Pulses: Normal pulses.  Pulmonary:     Effort: Pulmonary effort is normal.  Skin:    General: Skin is warm and dry.     Capillary Refill: Capillary refill takes less than 2 seconds.  Neurological:  Mental Status: He is alert.    Right Hand Exam   Tenderness  The patient is experiencing no tenderness.   Muscle Strength  The patient has normal right wrist strength.  Other  Sensation: normal Pulse: present  Comments:  Radial angulation of the index finger at the PIP joint.  Range of motion of the PIP joint from 0 to 80 degrees.  He has 10 degrees of passive hyperextension at this joint.  He has significant deformity involving his thumb.  He has a significant palmar abduction contracture with a large dorsal prominence at the [facility] joint.  He has crepitus with CMC grind but this is not painful for him.  He is otherwise able to make a full and complete fist.   Left Hand Exam   Tenderness  The patient is experiencing no tenderness.   Other  Sensation: normal Pulse: present  Comments:  Similar deformity of this thumb as the left.  He has a significant palmar abduction contracture.  He has a  large dorsal prominence at the osteoarthritic [facility] joint.  He has crepitus with grind test but this is nonpainful.  He is otherwise able to make a complete fist without pain.     Specialty Comments:  No specialty comments available.  Imaging: 3 views of the left hand taken today are reviewed interpreted by me.  They demonstrate osteoarthritis involving the index finger PIP joint with joint destruction.  He has significant osteoarthritis of the thumb CMC joint with near complete destruction of the trapezium and subsidence of the thumb metacarpal.  He has numerous cysts within multiple carpal bones including the capitate scaphoid lunate base of the thumb metacarpal and ulnar head. 3 views of the right hand taken today reviewed interpreted by me.  They also demonstrate significant osteoarthritis involving the thumb CMC joint with near total obstruction of the trapezium and subsidence of the thumb metacarpal.  He also has static hyperextension deformity at the thumb MP joint.   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 05/07/2021   Arthritis of carpometacarpal (CMC) joints of both thumbs 05/07/2021   Persistent migraine aura without cerebral infarction and without status migrainosus, not intractable 11/15/2020   Type 2 macular telangiectasis, bilateral 03/06/2020   Intermediate stage nonexudative age-related macular degeneration of both eyes 03/06/2020   Pseudophakia 03/06/2020   Typical atrial flutter ([place]) 07/29/2019   Secondary hypercoagulable state ([place]) 07/06/2019   NSTEMI (non-ST elevated myocardial infarction) ([place]) 08/21/2018   Migraine with aura and without status migrainosus, not intractable 10/14/2017   Generalized anxiety disorder 10/10/2016   OSA on CPAP 10/10/2016   Bicuspid aortic valve    Complex sleep apnea syndrome 06/05/2016   Aortic insufficiency 01/19/2015   Ascending  aortic aneurysm 01/19/2015   Retinal hemorrhage 11/04/2014   Insomnia 11/04/2014   HTN (hypertension) 09/21/2014   Hyperlipidemia 09/21/2014   CAD (coronary artery disease) 09/21/2014   History of migraine 09/21/2014   Hypogonadism in male 09/21/2014   BPH (benign prostatic hyperplasia) 09/21/2014   Anxiety and depression 09/21/2014   Past Medical History:  Diagnosis Date   Anal fissure    Aortic insufficiency    Arthritis    Atrial flutter (HCC)    Bicuspid aortic valve    with mild to moderate AR by echo 08/2019   BPH (benign prostatic hyperplasia)    CAD (coronary artery disease)    s/p CABG [employer]  1998   Cataract    bilateral-removed   CKD (chronic kidney disease), stage III ([place])    Depression    Dilated aortic root (HCC)    History of blood transfusion 1998   "due to cardiac bypass surgery"   Hyperlipidemia    Hypertension    Hypogonadism in male    Kidney stones    Migraine    Mild carotid artery disease ([place]) 2009   Myocardial infarction ([place])    PVC's (premature ventricular contractions)    Retinal hemorrhage 11/04/2014   Sleep apnea with use of continuous positive airway pressure (CPAP)    Thoracic aortic aneurysm (TAA)     Family History  Problem Relation Age of Onset   Arthritis Mother        deceased   Hyperlipidemia Mother    Diabetes Mother    Arthritis Father    Hyperlipidemia Father    Heart disease Father 67   Stroke Father 46       deceased   Hypertension Father    Diabetes Father    Kidney disease Paternal Grandfather    Colon cancer Neg Hx    Colon polyps Neg Hx    Esophageal cancer Neg Hx    Stomach cancer Neg Hx    Rectal cancer Neg Hx     Past Surgical History:  Procedure Laterality Date   A-FLUTTER ABLATION N/A 09/29/2019   Procedure: A-FLUTTER ABLATION;  Surgeon: Constance Haw, MD;  Location: [place];  Service: Cardiovascular;  Laterality: N/A;   CARDIOVERSION N/A 08/10/2019   Procedure: CARDIOVERSION;   Surgeon: Elouise Munroe, MD;  Location: [facility] ENDOSCOPY;  Service: Cardiovascular;  Laterality: N/A;   cataracts     COLONOSCOPY     CORONARY ARTERY BYPASS GRAFT  1998   LEFT HEART CATH AND CORONARY ANGIOGRAPHY N/A 08/24/2018   Procedure: LEFT HEART CATH AND CORONARY ANGIOGRAPHY;  Surgeon: Lorretta Harp, MD;  Location: [place];  Service: Cardiovascular;  Laterality: N/A;   TEE WITHOUT CARDIOVERSION N/A 01/19/2015   Procedure: TRANSESOPHAGEAL ECHOCARDIOGRAM (TEE);  Surgeon: Sueanne Margarita, MD;  Location: [facility] ENDOSCOPY;  Service: Cardiovascular;  Laterality: N/A;   [place]   pt denies   Social History   Occupational History   Occupation: retired  Tobacco Use   Smoking status: Former    Years: 20.00    Types: Cigarettes    Quit date: 07/22/1986    Years since quitting: 34.8   Smokeless tobacco: Never  Vaping Use   Vaping Use: Never used  Substance and Sexual Activity   Alcohol use: Yes    Alcohol/week: 1.0 standard drink    Types: 1 Glasses of wine per week    Comment: 0-1 daily   Drug use: No   Sexual activity: Not on file

## 2021-05-09 ENCOUNTER — Other Ambulatory Visit: Payer: Self-pay

## 2021-05-09 ENCOUNTER — Ambulatory Visit (INDEPENDENT_AMBULATORY_CARE_PROVIDER_SITE_OTHER): Payer: Medicare Other | Admitting: Ophthalmology

## 2021-05-09 ENCOUNTER — Encounter (HOSPITAL_COMMUNITY): Payer: Self-pay | Admitting: Physician Assistant

## 2021-05-09 ENCOUNTER — Ambulatory Visit (HOSPITAL_COMMUNITY)
Admission: RE | Admit: 2021-05-09 | Discharge: 2021-05-09 | Disposition: A | Discharge status: Home / Self Care | Payer: Medicare Other | Source: Ambulatory Visit | Attending: Physician Assistant | Admitting: Physician Assistant

## 2021-05-09 ENCOUNTER — Encounter (INDEPENDENT_AMBULATORY_CARE_PROVIDER_SITE_OTHER): Payer: Self-pay | Admitting: Ophthalmology

## 2021-05-09 VITALS — BP 120/80 | HR 116 | Ht 67.0 in | Wt 152.6 lb

## 2021-05-09 DIAGNOSIS — D6869 Other thrombophilia: Secondary | ICD-10-CM | POA: Diagnosis not present

## 2021-05-09 DIAGNOSIS — H35073 Retinal telangiectasis, bilateral: Secondary | ICD-10-CM | POA: Diagnosis not present

## 2021-05-09 DIAGNOSIS — Z7901 Long term (current) use of anticoagulants: Secondary | ICD-10-CM | POA: Diagnosis not present

## 2021-05-09 DIAGNOSIS — Z951 Presence of aortocoronary bypass graft: Secondary | ICD-10-CM | POA: Insufficient documentation

## 2021-05-09 DIAGNOSIS — I4819 Other persistent atrial fibrillation: Secondary | ICD-10-CM | POA: Diagnosis present

## 2021-05-09 DIAGNOSIS — I712 Thoracic aortic aneurysm, without rupture, unspecified: Secondary | ICD-10-CM | POA: Diagnosis not present

## 2021-05-09 DIAGNOSIS — I4892 Unspecified atrial flutter: Secondary | ICD-10-CM | POA: Diagnosis not present

## 2021-05-09 DIAGNOSIS — I252 Old myocardial infarction: Secondary | ICD-10-CM | POA: Insufficient documentation

## 2021-05-09 DIAGNOSIS — I2510 Atherosclerotic heart disease of native coronary artery without angina pectoris: Secondary | ICD-10-CM | POA: Insufficient documentation

## 2021-05-09 DIAGNOSIS — G4733 Obstructive sleep apnea (adult) (pediatric): Secondary | ICD-10-CM | POA: Insufficient documentation

## 2021-05-09 DIAGNOSIS — Z79899 Other long term (current) drug therapy: Secondary | ICD-10-CM | POA: Diagnosis not present

## 2021-05-09 DIAGNOSIS — I129 Hypertensive chronic kidney disease with stage 1 through stage 4 chronic kidney disease, or unspecified chronic kidney disease: Secondary | ICD-10-CM | POA: Diagnosis not present

## 2021-05-09 DIAGNOSIS — N183 Chronic kidney disease, stage 3 unspecified: Secondary | ICD-10-CM | POA: Diagnosis not present

## 2021-05-09 DIAGNOSIS — E785 Hyperlipidemia, unspecified: Secondary | ICD-10-CM | POA: Diagnosis not present

## 2021-05-09 DIAGNOSIS — H353132 Nonexudative age-related macular degeneration, bilateral, intermediate dry stage: Secondary | ICD-10-CM | POA: Diagnosis not present

## 2021-05-09 NOTE — H&P (View-Only) (Signed)
Primary Care Physician: Burman Freestone, MD Primary Cardiologist: Dr Radford Pax Primary Electrophysiologist: Dr Curt Bears Referring Physician: Dr Jules Husbands is a 75 y.o. male with a history of CAD with CABG in 1998, bicuspid AV with moderate to severe AI and dilated aortic root followed by Dr. Clementeen Graham at [facility], dyslipidemia, HTN, OSA, atrial flutter who presents for follow up in the [facility].  The patient was initially diagnosed with atrial flutter after presented to [facility] ER with symptoms of palpitations. He underwent successful DCCV. He is on Eliquis for a CHADS2VASC score of 3. Patient saw Dr Radford Pax in follow up on 06/22/19 and reported that he was having increased palpitations. A 30 day event monitor was placed. Which did show an episode of atrial flutter with HR 120. Patient reports that he did have symptoms of palpitations for about 1 hour which correspond to the event. He denies any significant alcohol use. There were no specific triggers that he could identify. He is s/p atrial flutter ablation on 09/29/19 with Dr Curt Bears.  On follow up today, he was seen as an add on to Dr Antionette Char schedule on 04/27/21 and was in new onset afib. He had felt some palpitations and his home monitor showed an irregular heart beat. No other associated symptoms. He was started on Eliquis and his BB was increased. He is very disappointed today that he is in afib. He tries very hard to take care of his health.   Today, he denies symptoms of chest pain, shortness of breath, orthopnea, PND, lower extremity edema, dizziness, presyncope, syncope, snoring, daytime somnolence, bleeding, or neurologic sequela. The patient is tolerating medications without difficulties and is otherwise without complaint today.    Atrial Fibrillation Risk Factors:  he does have symptoms or diagnosis of sleep apnea. he is compliant with CPAP therapy. he does not have a history of rheumatic  fever. he does not have a history of alcohol use. The patient does not have a history of early familial atrial fibrillation or other arrhythmias.  he has a BMI of Body mass index is 23.9 kg/m.Marland Kitchen Filed Weights   05/09/21 1417  Weight: 69.2 kg     Family History  Problem Relation Age of Onset   Arthritis Mother        deceased   Hyperlipidemia Mother    Diabetes Mother    Arthritis Father    Hyperlipidemia Father    Heart disease Father 32   Stroke Father 56       deceased   Hypertension Father    Diabetes Father    Kidney disease Paternal Grandfather    Colon cancer Neg Hx    Colon polyps Neg Hx    Esophageal cancer Neg Hx    Stomach cancer Neg Hx    Rectal cancer Neg Hx      Atrial Fibrillation Management history:  Previous antiarrhythmic drugs: amiodarone Previous cardioversions: 04/2019 [facility]) Previous ablations: 09/29/19 CHADS2VASC score: 3 Anticoagulation history: Eliquis   Past Medical History:  Diagnosis Date   Anal fissure    Aortic insufficiency    Arthritis    Atrial flutter (HCC)    Bicuspid aortic valve    with mild to moderate AR by echo 08/2019   BPH (benign prostatic hyperplasia)    CAD (coronary artery disease)    s/p CABG [employer] 1998   Cataract    bilateral-removed   CKD (chronic kidney disease), stage III ([place])  Depression    Dilated aortic root (HCC)    History of blood transfusion 1998   "due to cardiac bypass surgery"   Hyperlipidemia    Hypertension    Hypogonadism in male    Kidney stones    Migraine    Mild carotid artery disease ([place]) 2009   Myocardial infarction ([place])    PVC's (premature ventricular contractions)    Retinal hemorrhage 11/04/2014   Sleep apnea with use of continuous positive airway pressure (CPAP)    Thoracic aortic aneurysm (TAA)    Past Surgical History:  Procedure Laterality Date   A-FLUTTER ABLATION N/A 09/29/2019   Procedure: A-FLUTTER ABLATION;  Surgeon: Constance Haw, MD;   Location: [place] [facility];  Service: Cardiovascular;  Laterality: N/A;   CARDIOVERSION N/A 08/10/2019   Procedure: CARDIOVERSION;  Surgeon: Elouise Munroe, MD;  Location: [facility] ENDOSCOPY;  Service: Cardiovascular;  Laterality: N/A;   cataracts     COLONOSCOPY     CORONARY ARTERY BYPASS GRAFT  1998   LEFT HEART CATH AND CORONARY ANGIOGRAPHY N/A 08/24/2018   Procedure: LEFT HEART CATH AND CORONARY ANGIOGRAPHY;  Surgeon: Lorretta Harp, MD;  Location: [place] [facility];  Service: Cardiovascular;  Laterality: N/A;   TEE WITHOUT CARDIOVERSION N/A 01/19/2015   Procedure: TRANSESOPHAGEAL ECHOCARDIOGRAM (TEE);  Surgeon: Sueanne Margarita, MD;  Location: [facility] ENDOSCOPY;  Service: Cardiovascular;  Laterality: N/A;   [place]   pt denies    Current Outpatient Medications  Medication Sig Dispense Refill   acetaminophen (TYLENOL) 325 MG tablet Take 650 mg by mouth every 6 (six) hours as needed for mild pain or moderate pain.     ALPRAZOLAM XR 1 MG 24 hr tablet TAKE ONE TABLET BY MOUTH DAILY 90 tablet 1   amLODipine (NORVASC) 10 MG tablet Take 1 tablet (10 mg total) by mouth daily. 90 tablet 3   apixaban (ELIQUIS) 5 MG TABS tablet Take 1 tablet (5 mg total) by mouth 2 (two) times daily. 60 tablet 11   Ascorbic Acid (VITAMIN C) 1000 MG tablet Take 1,000 mg by mouth 2 (two) times daily.     aspirin-acetaminophen-caffeine (EXCEDRIN MIGRAINE) 250-250-65 MG tablet Take 2 tablets by mouth daily as needed for headache.      butalbital-aspirin-caffeine-codeine (FIORINAL WITH CODEINE) 50-325-40-30 MG capsule Prn use for break through migraine 30 capsule 5   diazepam (VALIUM) 5 MG tablet Take 2.5-5 mg by mouth 2 (two) times daily as needed for anxiety.      ibuprofen (ADVIL,MOTRIN) 200 MG tablet Take 200-400 mg by mouth 2 (two) times daily as needed (for arthritis in the feet).      losartan (COZAAR) 25 MG tablet Take 1 tablet (25 mg total) by mouth daily. 90 tablet 3   Nebivolol HCl (BYSTOLIC)  20 MG TABS Take 1 tablet (20 mg total) by mouth daily. 90 tablet 3   niacin 500 MG tablet Take 500 mg by mouth at bedtime.     rosuvastatin (CRESTOR) 20 MG tablet Take 1 tablet (20 mg total) by mouth daily. 90 tablet 3   Specialty Vitamins Products (CENTRUM SPECIALIST ENERGY) TABS Take 1 tablet by mouth daily with breakfast.     Testosterone 40.5 MG/2.5GM (1.62%) GEL Place 1 application onto the skin daily. Apply 2 pumps to each shoulder every morning     traZODone (DESYREL) 50 MG tablet Take 1 tablet (50 mg total) by mouth at bedtime. 30 tablet 5   Turmeric 500 MG CAPS Take  1,000 mg by mouth daily.      vitamin E 400 UNIT capsule Take 400 Units by mouth at bedtime.      Current Facility-Administered Medications  Medication Dose Route Frequency Provider Last Rate Last Admin   0.9 %  sodium chloride infusion  500 mL Intravenous Once [place], MD       0.9 %  sodium chloride infusion  500 mL Intravenous Once Armbruster, Carlota Raspberry, MD        Allergies  Allergen Reactions   Dog Epithelium Allergy Skin Test Itching, Anxiety, Palpitations and Other (See Comments)    Other reaction(s): Respiratory Distress (ALLERGY/intolerance), Wheezing (ALLERGY/intolerance)   Mushroom Extract Complex Nausea Only and Other (See Comments)    Severe vertigo and headaches    Social History   Socioeconomic History   Marital status: Divorced    Spouse name: Not on file   Number of children: 0   Years of education: Not on file   Highest education level: Not on file  Occupational History   Occupation: retired  Tobacco Use   Smoking status: Former    Years: 20.00    Types: Cigarettes    Quit date: 07/22/1986    Years since quitting: 34.8   Smokeless tobacco: Never   Tobacco comments:    Former smoker 05/09/2021  Vaping Use   Vaping Use: Never used  Substance and Sexual Activity   Alcohol use: Yes    Alcohol/week: 1.0 standard drink    Types: 1 Glasses of wine per week    Comment: 0-1 daily    Drug use: No   Sexual activity: Not on file  Other Topics Concern   Not on file  Social History Narrative   Divorced   Secondary school teacher- retired   No children   Has a Neurosurgeon named [name]   Enjoys outdoor activities- hiking, swimming, Control and instrumentation engineer, baseball, writing, reading, cooking   Social Determinants of Radio broadcast assistant Strain: Not on file  Food Insecurity: Not on file  Transportation Needs: Not on file  Physical Activity: Not on file  Stress: Not on file  Social Connections: Not on file  Intimate Partner Violence: Not on file     ROS- All systems are reviewed and negative except as per the HPI above.  Physical Exam: Vitals:   05/09/21 1417  BP: 120/80  Pulse: (!) 116  Weight: 69.2 kg  Height: 5\' 7"  (1.702 m)    GEN- The patient is a well appearing male, alert and oriented x 3 today.   HEENT-head normocephalic, atraumatic, sclera clear, conjunctiva pink, hearing intact, trachea midline. Lungs- Clear to ausculation bilaterally, normal work of breathing Heart- irregular rate and rhythm, no murmurs, rubs or gallops  GI- soft, NT, ND, + BS Extremities- no clubbing, cyanosis, or edema MS- no significant deformity or atrophy Skin- no rash or lesion Psych- euthymic mood, full affect Neuro- strength and sensation are intact   Wt Readings from Last 3 Encounters:  05/09/21 69.2 kg  04/27/21 66.5 kg  12/28/20 67.6 kg    EKG today demonstrates  Afib Vent. rate 116 BPM PR interval * ms QRS duration 76 ms QT/QTcB 294/408 ms  Echo 08/28/20 demonstrated  1. Aorta measurements are stable compared with prior study 09/07/2019. Aortic dilatation noted. Aneurysm of the aortic root, measuring 51 mm. 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   2. Tricuspid aortic valve (clearly tricuspid on TEE 2016). Mild to  moderate eccentric AI related to incomplete leaflet  closure from aortic  root aneurysm. The aortic valve is tricuspid. Aortic valve regurgitation  is mild to  moderate. No aortic stenosis  is present.   3. Left ventricular ejection fraction, by estimation, is 60 to 65%. The  left ventricle has normal function. The left ventricle has no regional  wall motion abnormalities. There is mild concentric left ventricular  hypertrophy. Left ventricular diastolic parameters are indeterminate.   4. Right ventricular systolic function is normal. The right ventricular  size is normal. There is normal pulmonary artery systolic pressure. The estimated right ventricular systolic pressure is 06.2 mmHg.   5. The mitral valve is grossly normal. No evidence of mitral valve  regurgitation. No evidence of mitral stenosis.   6. The inferior vena cava is normal in size with greater than 50%  respiratory variability, suggesting right atrial pressure of 3 mmHg.   Comparison(s): No significant change from prior study.   Epic records are reviewed at length today  CHA2DS2-VASc Score = 3  The patient's score is based upon: CHF History: 0 HTN History: 1 Diabetes History: 0 Stroke History: 0 Vascular Disease History: 1 Age Score: 1 Gender Score: 0       ASSESSMENT AND PLAN: 1. Persistent Atrial Fibrillation/atrial flutter The patient's CHA2DS2-VASc score is 3, indicating a 3.2% annual risk of stroke.   S/p flutter ablation 09/29/19 Now with afib. We discussed rhythm control options. Will plan for DCCV after 3 weeks of uninterrupted anticoagulation. Check bmet/cbc today.  If he has significant recurrence of his afib, he would likely be a good candidate for afib ablation.  Continue Eliquis 5 mg BID Continue nebivolol 20 mg daily  2. Secondary Hypercoagulable State (ICD10:  D68.69) The patient is at significant risk for stroke/thromboembolism based upon his CHA2DS2-VASc Score of 3.  Continue Apixaban (Eliquis).   3. Obstructive sleep apnea Patient reports compliance with CPAP therapy.   4. HTN Stable, no changes today.  5. CAD No anginal  symptoms.    Follow up in the AF clinic post DCCV.    [facility] [address] [place], [place] [address] [phone] 05/09/2021 4:33 PM

## 2021-05-09 NOTE — Progress Notes (Signed)
05/09/2021     CHIEF COMPLAINT Patient presents for  Chief Complaint  Patient presents with   Retina Follow Up      HISTORY OF PRESENT ILLNESS: [name] is a 75 y.o. male who presents to the clinic today for:   HPI     Retina Follow Up   Patient presents with  Other.  In both eyes.  This started 3 weeks ago.  Severity is mild.  Duration of 3 weeks.  Since onset it is stable.        Comments   3 week fp/ ffa R/L oct. Patient states vision is stable and unchanged since last visit. Denies any new floaters or FOL. Pt is unhappy due to wait time. He was checked in at 10:40 for his 10:45 appt and did not get taken back until 11:25 due to being behind on the patients scheduled before him. Pt is also not happy, as stated "I am not looking forward to that either," when I went over what he is here for today. Patient declined rescheduling.       Last edited by Hurman Horn, MD on 05/09/2021 11:44 AM.      Referring physician: Burman Freestone, MD [address],  [place] [address]  HISTORICAL INFORMATION:   Selected notes from the MEDICAL RECORD NUMBER    Lab Results  Component Value Date   HGBA1C 5.9 (H) 08/22/2018     CURRENT MEDICATIONS: No current outpatient medications on file. (Ophthalmic Drugs)   No current facility-administered medications for this visit. (Ophthalmic Drugs)   Current Outpatient Medications (Other)  Medication Sig   acetaminophen (TYLENOL) 325 MG tablet Take 650 mg by mouth every 6 (six) hours as needed for mild pain or moderate pain.   ALPRAZOLAM XR 1 MG 24 hr tablet TAKE ONE TABLET BY MOUTH DAILY   amLODipine (NORVASC) 10 MG tablet Take 1 tablet (10 mg total) by mouth daily.   apixaban (ELIQUIS) 5 MG TABS tablet Take 1 tablet (5 mg total) by mouth 2 (two) times daily.   Ascorbic Acid (VITAMIN C) 1000 MG tablet Take 1,000 mg by mouth 2 (two) times daily.   aspirin-acetaminophen-caffeine (EXCEDRIN MIGRAINE) 250-250-65 MG  tablet Take 2 tablets by mouth daily as needed for headache.    butalbital-aspirin-caffeine-codeine (FIORINAL WITH CODEINE) 50-325-40-30 MG capsule Prn use for break through migraine   diazepam (VALIUM) 5 MG tablet Take 2.5-5 mg by mouth 2 (two) times daily as needed for anxiety.    ibuprofen (ADVIL,MOTRIN) 200 MG tablet Take 200-400 mg by mouth 2 (two) times daily as needed (for arthritis in the feet).    losartan (COZAAR) 25 MG tablet Take 1 tablet (25 mg total) by mouth daily.   Nebivolol HCl (BYSTOLIC) 20 MG TABS Take 1 tablet (20 mg total) by mouth daily.   niacin 500 MG tablet Take 500 mg by mouth at bedtime.   rosuvastatin (CRESTOR) 20 MG tablet Take 1 tablet (20 mg total) by mouth daily.   Specialty Vitamins Products (CENTRUM SPECIALIST ENERGY) TABS Take 1 tablet by mouth daily with breakfast.   Testosterone 40.5 MG/2.5GM (1.62%) GEL Place 1 application onto the skin daily. Apply 2 pumps to each shoulder every morning   traZODone (DESYREL) 50 MG tablet Take 1 tablet (50 mg total) by mouth at bedtime.   Turmeric 500 MG CAPS Take 1,000 mg by mouth daily.    vitamin E 400 UNIT capsule Take 400 Units by mouth at bedtime.  Current Facility-Administered Medications (Other)  Medication Route   0.9 %  sodium chloride infusion Intravenous   0.9 %  sodium chloride infusion Intravenous      REVIEW OF SYSTEMS:    ALLERGIES Allergies  Allergen Reactions   Dog Epithelium Allergy Skin Test Itching, Anxiety, Palpitations and Other (See Comments)    Other reaction(s): Respiratory Distress (ALLERGY/intolerance), Wheezing (ALLERGY/intolerance)   Mushroom Extract Complex Nausea Only and Other (See Comments)    Severe vertigo and headaches    PAST MEDICAL HISTORY Past Medical History:  Diagnosis Date   Anal fissure    Aortic insufficiency    Arthritis    Atrial flutter (HCC)    Bicuspid aortic valve    with mild to moderate AR by echo 08/2019   BPH (benign prostatic hyperplasia)     CAD (coronary artery disease)    s/p CABG [facility] [place]   Cataract    bilateral-removed   CKD (chronic kidney disease), stage III ([place])    Depression    Dilated aortic root (HCC)    History of blood transfusion 1998   "due to cardiac bypass surgery"   Hyperlipidemia    Hypertension    Hypogonadism in male    Kidney stones    Migraine    Mild carotid artery disease ([place]) 2009   Myocardial infarction ([place])    PVC's (premature ventricular contractions)    Retinal hemorrhage 11/04/2014   Sleep apnea with use of continuous positive airway pressure (CPAP)    Thoracic aortic aneurysm (TAA)    Past Surgical History:  Procedure Laterality Date   A-FLUTTER ABLATION N/A 09/29/2019   Procedure: A-FLUTTER ABLATION;  Surgeon: Constance Haw, MD;  Location: [facility];  Service: Cardiovascular;  Laterality: N/A;   CARDIOVERSION N/A 08/10/2019   Procedure: CARDIOVERSION;  Surgeon: Elouise Munroe, MD;  Location: [facility] ENDOSCOPY;  Service: Cardiovascular;  Laterality: N/A;   cataracts     COLONOSCOPY     CORONARY ARTERY BYPASS GRAFT  1998   LEFT HEART CATH AND CORONARY ANGIOGRAPHY N/A 08/24/2018   Procedure: LEFT HEART CATH AND CORONARY ANGIOGRAPHY;  Surgeon: Lorretta Harp, MD;  Location: [place] [facility];  Service: Cardiovascular;  Laterality: N/A;   TEE WITHOUT CARDIOVERSION N/A 01/19/2015   Procedure: TRANSESOPHAGEAL ECHOCARDIOGRAM (TEE);  Surgeon: Sueanne Margarita, MD;  Location: [facility] ENDOSCOPY;  Service: Cardiovascular;  Laterality: N/A;   [place]   pt denies    FAMILY HISTORY Family History  Problem Relation Age of Onset   Arthritis Mother        deceased   Hyperlipidemia Mother    Diabetes Mother    Arthritis Father    Hyperlipidemia Father    Heart disease Father 67   Stroke Father 67       deceased   Hypertension Father    Diabetes Father    Kidney disease Paternal Grandfather    Colon cancer Neg Hx    Colon polyps Neg Hx     Esophageal cancer Neg Hx    Stomach cancer Neg Hx    Rectal cancer Neg Hx     SOCIAL HISTORY Social History   Tobacco Use   Smoking status: Former    Years: 20.00    Types: Cigarettes    Quit date: 07/22/1986    Years since quitting: 34.8   Smokeless tobacco: Never  Vaping Use   Vaping Use: Never used  Substance Use Topics   Alcohol use: Yes  Alcohol/week: 1.0 standard drink    Types: 1 Glasses of wine per week    Comment: 0-1 daily   Drug use: No         OPHTHALMIC EXAM:  Base Eye Exam     Visual Acuity (ETDRS)       Right Left   Dist [place] 20/50 -2 20/40 -1   Dist ph [place] 20/30 -1          Tonometry (Tonopen, 11:33 AM)       Right Left   Pressure 14 18         Pupils       Pupils Dark Light Shape React APD   Right PERRL 4 3 Round Brisk None   Left PERRL 4 3 Round Brisk None         Extraocular Movement       Right Left    Full Full         Neuro/Psych     Oriented x3: Yes   Mood/Affect: Normal         Dilation     Both eyes: 1.0% Mydriacyl, 2.5% Phenylephrine @ 11:33 AM           Slit Lamp and Fundus Exam     External Exam       Right Left   External Normal Normal         Slit Lamp Exam       Right Left   Lids/Lashes Normal Normal   Conjunctiva/Sclera White and quiet White and quiet   Cornea Clear Clear   Anterior Chamber Deep and quiet Deep and quiet   Iris Round and reactive Round and reactive   Lens Centered posterior chamber intraocular lens Centered posterior chamber intraocular lens   Anterior Vitreous Normal Normal            IMAGING AND PROCEDURES  Imaging and Procedures for 05/09/21  OCT, Retina - OU - Both Eyes       Right Eye Quality was good. Scan locations included subfoveal. Central Foveal Thickness: 239. Progression has been stable. Findings include subretinal hyper-reflective material, vitreomacular adhesion .   Left Eye Quality was good. Scan locations included subfoveal. Central  Foveal Thickness: 157. Progression has improved. Findings include subretinal hyper-reflective material, outer retinal atrophy, vitreomacular adhesion .   Notes OD with progressive cavitary atrophy temporal portion of FAZ yet intact outer retina  OS with outer retinal distortion and now collapse of the outer retinal cavitary area or serous detachment noted some 6 years previous.     Color Fundus Photography Optos - OU - Both Eyes       Right Eye Progression has been stable. Disc findings include normal observations. Macula : retinal pigment epithelium abnormalities. Vessels : normal observations.   Left Eye Progression has been stable. Macula : retinal pigment epithelium abnormalities. Vessels : normal observations. Periphery : normal observations.   Notes History of MAC-TEL with RPE abnormalities and old exudates temporal left eye and RPE changesbTemporally OD             ASSESSMENT/PLAN:  No problem-specific Assessment & Plan notes found for this encounter.      ICD-10-CM   1. Retinal telangiectasia of both eyes  H35.073 OCT, Retina - OU - Both Eyes    Color Fundus Photography Optos - OU - Both Eyes    CANCELED: Fluorescein Angiography Optos (Transit OD)    2. Intermediate stage nonexudative age-related macular degeneration of both eyes  H35.3132 OCT, Retina - OU - Both Eyes    Color Fundus Photography Optos - OU - Both Eyes    CANCELED: Fluorescein Angiography Optos (Transit OD)      1.  As we began to commence with fundus fluorescein angiography and prior to the injection of the fluorescein, patient became emotionally upset with some personal matters ongoing at his home.  For this reason the the examination was halted as well as no further examination undertaken.  2.  Taken to a private room for words of encouragement and support.  Patient expressed regret for not being able to complete the examination and the testing.  Nonetheless I fully support his rescheduling  at a better time that personal matters or not pressing upon him with this intensity  3.  Ophthalmic Meds Ordered this visit:  No orders of the defined types were placed in this encounter.      No follow-ups on file.  There are no Patient Instructions on file for this visit.   Explained the diagnoses, plan, and follow up with the patient and they expressed understanding.  Patient expressed understanding of the importance of proper follow up care.   Clent Demark Orva Gwaltney M.D. Diseases & Surgery of the Retina and Vitreous Retina & Diabetic [place] 05/09/21     Abbreviations: M myopia (nearsighted); A astigmatism; H hyperopia (farsighted); P presbyopia; Mrx spectacle prescription;  CTL contact lenses; OD right eye; OS left eye; OU both eyes  XT exotropia; ET esotropia; PEK punctate epithelial keratitis; PEE punctate epithelial erosions; DES dry eye syndrome; MGD meibomian gland dysfunction; ATs artificial tears; PFAT's preservative free artificial tears; [place] nuclear sclerotic cataract; PSC posterior subcapsular cataract; ERM epi-retinal membrane; PVD posterior vitreous detachment; RD retinal detachment; DM diabetes mellitus; DR diabetic retinopathy; NPDR non-proliferative diabetic retinopathy; PDR proliferative diabetic retinopathy; CSME clinically significant macular edema; DME diabetic macular edema; dbh dot blot hemorrhages; CWS cotton wool spot; POAG primary open angle glaucoma; C/D cup-to-disc ratio; HVF humphrey visual field; GVF goldmann visual field; OCT optical coherence tomography; IOP intraocular pressure; BRVO Branch retinal vein occlusion; CRVO central retinal vein occlusion; CRAO central retinal artery occlusion; BRAO branch retinal artery occlusion; RT retinal tear; SB scleral buckle; PPV pars plana vitrectomy; VH Vitreous hemorrhage; PRP panretinal laser photocoagulation; IVK intravitreal kenalog; VMT vitreomacular traction; MH Macular hole;  NVD neovascularization of the disc; NVE  neovascularization elsewhere; AREDS age related eye disease study; ARMD age related macular degeneration; POAG primary open angle glaucoma; EBMD epithelial/anterior basement membrane dystrophy; ACIOL anterior chamber intraocular lens; IOL intraocular lens; PCIOL posterior chamber intraocular lens; Phaco/IOL phacoemulsification with intraocular lens placement; [place] photorefractive keratectomy; LASIK laser assisted in situ keratomileusis; HTN hypertension; DM diabetes mellitus; COPD chronic obstructive pulmonary disease

## 2021-05-09 NOTE — Progress Notes (Signed)
Primary Care Physician: Burman Freestone, MD Primary Cardiologist: Dr Radford Pax Primary Electrophysiologist: Dr Curt Bears Referring Physician: Dr Jules Husbands is a 75 y.o. male with a history of CAD with CABG in 1998, bicuspid AV with moderate to severe AI and dilated aortic root followed by Dr. Clementeen Graham at [facility], dyslipidemia, HTN, OSA, atrial flutter who presents for follow up in the [facility].  The patient was initially diagnosed with atrial flutter after presented to [facility] ER with symptoms of palpitations. He underwent successful DCCV. He is on Eliquis for a CHADS2VASC score of 3. Patient saw Dr Radford Pax in follow up on 06/22/19 and reported that he was having increased palpitations. A 30 day event monitor was placed. Which did show an episode of atrial flutter with HR 120. Patient reports that he did have symptoms of palpitations for about 1 hour which correspond to the event. He denies any significant alcohol use. There were no specific triggers that he could identify. He is s/p atrial flutter ablation on 09/29/19 with Dr Curt Bears.  On follow up today, he was seen as an add on to Dr Antionette Char schedule on 04/27/21 and was in new onset afib. He had felt some palpitations and his home monitor showed an irregular heart beat. No other associated symptoms. He was started on Eliquis and his BB was increased. He is very disappointed today that he is in afib. He tries very hard to take care of his health.   Today, he denies symptoms of chest pain, shortness of breath, orthopnea, PND, lower extremity edema, dizziness, presyncope, syncope, snoring, daytime somnolence, bleeding, or neurologic sequela. The patient is tolerating medications without difficulties and is otherwise without complaint today.    Atrial Fibrillation Risk Factors:  he does have symptoms or diagnosis of sleep apnea. he is compliant with CPAP therapy. he does not have a history of rheumatic  fever. he does not have a history of alcohol use. The patient does not have a history of early familial atrial fibrillation or other arrhythmias.  he has a BMI of Body mass index is 23.9 kg/m.Marland Kitchen Filed Weights   05/09/21 1417  Weight: 69.2 kg     Family History  Problem Relation Age of Onset   Arthritis Mother        deceased   Hyperlipidemia Mother    Diabetes Mother    Arthritis Father    Hyperlipidemia Father    Heart disease Father 105   Stroke Father 61       deceased   Hypertension Father    Diabetes Father    Kidney disease Paternal Grandfather    Colon cancer Neg Hx    Colon polyps Neg Hx    Esophageal cancer Neg Hx    Stomach cancer Neg Hx    Rectal cancer Neg Hx      Atrial Fibrillation Management history:  Previous antiarrhythmic drugs: amiodarone Previous cardioversions: 04/2019 [facility]) Previous ablations: 09/29/19 CHADS2VASC score: 3 Anticoagulation history: Eliquis   Past Medical History:  Diagnosis Date   Anal fissure    Aortic insufficiency    Arthritis    Atrial flutter (HCC)    Bicuspid aortic valve    with mild to moderate AR by echo 08/2019   BPH (benign prostatic hyperplasia)    CAD (coronary artery disease)    s/p CABG [facility] [place] 1998   Cataract    bilateral-removed   CKD (chronic kidney disease), stage III ([place])  Depression    Dilated aortic root (HCC)    History of blood transfusion 1998   "due to cardiac bypass surgery"   Hyperlipidemia    Hypertension    Hypogonadism in male    Kidney stones    Migraine    Mild carotid artery disease ([place]) 2009   Myocardial infarction ([place])    PVC's (premature ventricular contractions)    Retinal hemorrhage 11/04/2014   Sleep apnea with use of continuous positive airway pressure (CPAP)    Thoracic aortic aneurysm (TAA)    Past Surgical History:  Procedure Laterality Date   A-FLUTTER ABLATION N/A 09/29/2019   Procedure: A-FLUTTER ABLATION;  Surgeon: Constance Haw, MD;   Location: [place] [facility];  Service: Cardiovascular;  Laterality: N/A;   CARDIOVERSION N/A 08/10/2019   Procedure: CARDIOVERSION;  Surgeon: Elouise Munroe, MD;  Location: [facility] ENDOSCOPY;  Service: Cardiovascular;  Laterality: N/A;   cataracts     COLONOSCOPY     CORONARY ARTERY BYPASS GRAFT  1998   LEFT HEART CATH AND CORONARY ANGIOGRAPHY N/A 08/24/2018   Procedure: LEFT HEART CATH AND CORONARY ANGIOGRAPHY;  Surgeon: Lorretta Harp, MD;  Location: [facility];  Service: Cardiovascular;  Laterality: N/A;   TEE WITHOUT CARDIOVERSION N/A 01/19/2015   Procedure: TRANSESOPHAGEAL ECHOCARDIOGRAM (TEE);  Surgeon: Sueanne Margarita, MD;  Location: [facility] ENDOSCOPY;  Service: Cardiovascular;  Laterality: N/A;   [place]   pt denies    Current Outpatient Medications  Medication Sig Dispense Refill   acetaminophen (TYLENOL) 325 MG tablet Take 650 mg by mouth every 6 (six) hours as needed for mild pain or moderate pain.     ALPRAZOLAM XR 1 MG 24 hr tablet TAKE ONE TABLET BY MOUTH DAILY 90 tablet 1   amLODipine (NORVASC) 10 MG tablet Take 1 tablet (10 mg total) by mouth daily. 90 tablet 3   apixaban (ELIQUIS) 5 MG TABS tablet Take 1 tablet (5 mg total) by mouth 2 (two) times daily. 60 tablet 11   Ascorbic Acid (VITAMIN C) 1000 MG tablet Take 1,000 mg by mouth 2 (two) times daily.     aspirin-acetaminophen-caffeine (EXCEDRIN MIGRAINE) 250-250-65 MG tablet Take 2 tablets by mouth daily as needed for headache.      butalbital-aspirin-caffeine-codeine (FIORINAL WITH CODEINE) 50-325-40-30 MG capsule Prn use for break through migraine 30 capsule 5   diazepam (VALIUM) 5 MG tablet Take 2.5-5 mg by mouth 2 (two) times daily as needed for anxiety.      ibuprofen (ADVIL,MOTRIN) 200 MG tablet Take 200-400 mg by mouth 2 (two) times daily as needed (for arthritis in the feet).      losartan (COZAAR) 25 MG tablet Take 1 tablet (25 mg total) by mouth daily. 90 tablet 3   Nebivolol HCl (BYSTOLIC)  20 MG TABS Take 1 tablet (20 mg total) by mouth daily. 90 tablet 3   niacin 500 MG tablet Take 500 mg by mouth at bedtime.     rosuvastatin (CRESTOR) 20 MG tablet Take 1 tablet (20 mg total) by mouth daily. 90 tablet 3   Specialty Vitamins Products (CENTRUM SPECIALIST ENERGY) TABS Take 1 tablet by mouth daily with breakfast.     Testosterone 40.5 MG/2.5GM (1.62%) GEL Place 1 application onto the skin daily. Apply 2 pumps to each shoulder every morning     traZODone (DESYREL) 50 MG tablet Take 1 tablet (50 mg total) by mouth at bedtime. 30 tablet 5   Turmeric 500 MG CAPS Take  1,000 mg by mouth daily.      vitamin E 400 UNIT capsule Take 400 Units by mouth at bedtime.      Current Facility-Administered Medications  Medication Dose Route Frequency Provider Last Rate Last Admin   0.9 %  sodium chloride infusion  500 mL Intravenous Once [place], MD       0.9 %  sodium chloride infusion  500 mL Intravenous Once Armbruster, Carlota Raspberry, MD        Allergies  Allergen Reactions   Dog Epithelium Allergy Skin Test Itching, Anxiety, Palpitations and Other (See Comments)    Other reaction(s): Respiratory Distress (ALLERGY/intolerance), Wheezing (ALLERGY/intolerance)   Mushroom Extract Complex Nausea Only and Other (See Comments)    Severe vertigo and headaches    Social History   Socioeconomic History   Marital status: Divorced    Spouse name: Not on file   Number of children: 0   Years of education: Not on file   Highest education level: Not on file  Occupational History   Occupation: retired  Tobacco Use   Smoking status: Former    Years: 20.00    Types: Cigarettes    Quit date: 07/22/1986    Years since quitting: 34.8   Smokeless tobacco: Never   Tobacco comments:    Former smoker 05/09/2021  Vaping Use   Vaping Use: Never used  Substance and Sexual Activity   Alcohol use: Yes    Alcohol/week: 1.0 standard drink    Types: 1 Glasses of wine per week    Comment: 0-1 daily    Drug use: No   Sexual activity: Not on file  Other Topics Concern   Not on file  Social History Narrative   Divorced   Secondary school teacher- retired   No children   Has a Neurosurgeon named [name]   Enjoys outdoor activities- hiking, swimming, Control and instrumentation engineer, baseball, writing, reading, cooking   Social Determinants of Radio broadcast assistant Strain: Not on file  Food Insecurity: Not on file  Transportation Needs: Not on file  Physical Activity: Not on file  Stress: Not on file  Social Connections: Not on file  Intimate Partner Violence: Not on file     ROS- All systems are reviewed and negative except as per the HPI above.  Physical Exam: Vitals:   05/09/21 1417  BP: 120/80  Pulse: (!) 116  Weight: 69.2 kg  Height: 5\' 7"  (1.702 m)    GEN- The patient is a well appearing male, alert and oriented x 3 today.   HEENT-head normocephalic, atraumatic, sclera clear, conjunctiva pink, hearing intact, trachea midline. Lungs- Clear to ausculation bilaterally, normal work of breathing Heart- irregular rate and rhythm, no murmurs, rubs or gallops  GI- soft, NT, ND, + BS Extremities- no clubbing, cyanosis, or edema MS- no significant deformity or atrophy Skin- no rash or lesion Psych- euthymic mood, full affect Neuro- strength and sensation are intact   Wt Readings from Last 3 Encounters:  05/09/21 69.2 kg  04/27/21 66.5 kg  12/28/20 67.6 kg    EKG today demonstrates  Afib Vent. rate 116 BPM PR interval * ms QRS duration 76 ms QT/QTcB 294/408 ms  Echo 08/28/20 demonstrated  1. Aorta measurements are stable compared with prior study 09/07/2019. Aortic dilatation noted. Aneurysm of the aortic root, measuring 51 mm. 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   2. Tricuspid aortic valve (clearly tricuspid on TEE 2016). Mild to  moderate eccentric AI related to incomplete leaflet  closure from aortic  root aneurysm. The aortic valve is tricuspid. Aortic valve regurgitation  is mild to  moderate. No aortic stenosis  is present.   3. Left ventricular ejection fraction, by estimation, is 60 to 65%. The  left ventricle has normal function. The left ventricle has no regional  wall motion abnormalities. There is mild concentric left ventricular  hypertrophy. Left ventricular diastolic parameters are indeterminate.   4. Right ventricular systolic function is normal. The right ventricular  size is normal. There is normal pulmonary artery systolic pressure. The estimated right ventricular systolic pressure is 47.8 mmHg.   5. The mitral valve is grossly normal. No evidence of mitral valve  regurgitation. No evidence of mitral stenosis.   6. The inferior vena cava is normal in size with greater than 50%  respiratory variability, suggesting right atrial pressure of 3 mmHg.   Comparison(s): No significant change from prior study.   Epic records are reviewed at length today  CHA2DS2-VASc Score = 3  The patient's score is based upon: CHF History: 0 HTN History: 1 Diabetes History: 0 Stroke History: 0 Vascular Disease History: 1 Age Score: 1 Gender Score: 0       ASSESSMENT AND PLAN: 1. Persistent Atrial Fibrillation/atrial flutter The patient's CHA2DS2-VASc score is 3, indicating a 3.2% annual risk of stroke.   S/p flutter ablation 09/29/19 Now with afib. We discussed rhythm control options. Will plan for DCCV after 3 weeks of uninterrupted anticoagulation. Check bmet/cbc today.  If he has significant recurrence of his afib, he would likely be a good candidate for afib ablation.  Continue Eliquis 5 mg BID Continue nebivolol 20 mg daily  2. Secondary Hypercoagulable State (ICD10:  D68.69) The patient is at significant risk for stroke/thromboembolism based upon his CHA2DS2-VASc Score of 3.  Continue Apixaban (Eliquis).   3. Obstructive sleep apnea Patient reports compliance with CPAP therapy.   4. HTN Stable, no changes today.  5. CAD No anginal  symptoms.    Follow up in the AF clinic post DCCV.    [facility] [address] [place], [place] [address] [phone] 05/09/2021 4:33 PM

## 2021-05-09 NOTE — Patient Instructions (Signed)
Cardioversion scheduled for Monday October 31  - Arrive at the [facility] and go to admitting at 7:30am  -Do not eat or drink anything after midnight the night prior to your procedure.  - Take all your morning medication with a sip of water prior to arrival.  - You will not be able to drive home after your procedure.  Patients will be asked to: to mask in public and hand hygiene (no longer quarantine) in the 3 days prior to surgery, to report if any COVID-19-like illness or household contacts to COVID-19 to determine need for testing

## 2021-05-10 ENCOUNTER — Other Ambulatory Visit (HOSPITAL_COMMUNITY): Payer: Self-pay | Admitting: *Deleted

## 2021-05-10 ENCOUNTER — Encounter (HOSPITAL_COMMUNITY): Payer: Self-pay | Admitting: Cardiovascular Disease

## 2021-05-11 NOTE — Progress Notes (Signed)
Attempted to obtain medical history via telephone, unable to reach at this time. I left a voicemail to return pre surgical testing department's phone call.  

## 2021-05-20 NOTE — Anesthesia Preprocedure Evaluation (Addendum)
Anesthesia Evaluation  Patient identified by MRN, date of birth, ID band Patient awake    Reviewed: Allergy & Precautions, NPO status , Patient's Chart, lab work & pertinent test results  History of Anesthesia Complications Negative for: history of anesthetic complications  Airway Mallampati: II  TM Distance: >3 FB Neck ROM: Full    Dental  (+) Teeth Intact   Pulmonary sleep apnea and Continuous Positive Airway Pressure Ventilation , former smoker,    Pulmonary exam normal        Cardiovascular hypertension, Pt. on medications and Pt. on home beta blockers + CAD, + Past MI and + CABG (1998)  + dysrhythmias Atrial Fibrillation + Valvular Problems/Murmurs AI  Rhythm:Irregular Rate:Normal     Neuro/Psych  Headaches, Anxiety Depression Mild carotid stenosis    GI/Hepatic negative GI ROS, Neg liver ROS,   Endo/Other  negative endocrine ROS  Renal/GU CRFRenal disease  negative genitourinary   Musculoskeletal negative musculoskeletal ROS (+)   Abdominal   Peds  Hematology negative hematology ROS (+) Eliquis   Anesthesia Other Findings   Reproductive/Obstetrics                            Anesthesia Physical Anesthesia Plan  ASA: 3  Anesthesia Plan: General   Post-op Pain Management:    Induction: Intravenous  PONV Risk Score and Plan: TIVA and Treatment may vary due to age or medical condition  Airway Management Planned: Mask  Additional Equipment: None  Intra-op Plan:   Post-operative Plan:   Informed Consent: I have reviewed the patients History and Physical, chart, labs and discussed the procedure including the risks, benefits and alternatives for the proposed anesthesia with the patient or authorized representative who has indicated his/her understanding and acceptance.       Plan Discussed with:   Anesthesia Plan Comments:        Anesthesia Quick Evaluation

## 2021-05-21 ENCOUNTER — Ambulatory Visit (HOSPITAL_COMMUNITY)
Admission: RE | Admit: 2021-05-21 | Discharge: 2021-05-21 | Disposition: A | Discharge status: Home / Self Care | Payer: Medicare Other | Attending: Cardiovascular Disease | Admitting: Cardiovascular Disease

## 2021-05-21 ENCOUNTER — Encounter (HOSPITAL_COMMUNITY)
Admission: RE | Disposition: A | Discharge status: Home / Self Care | Payer: Self-pay | Source: Home / Self Care | Attending: Cardiovascular Disease

## 2021-05-21 ENCOUNTER — Encounter (HOSPITAL_COMMUNITY): Payer: Self-pay | Admitting: Cardiovascular Disease

## 2021-05-21 ENCOUNTER — Ambulatory Visit (HOSPITAL_COMMUNITY): Payer: Medicare Other | Admitting: Anesthesiology

## 2021-05-21 ENCOUNTER — Other Ambulatory Visit: Payer: Self-pay

## 2021-05-21 DIAGNOSIS — G4733 Obstructive sleep apnea (adult) (pediatric): Secondary | ICD-10-CM | POA: Insufficient documentation

## 2021-05-21 DIAGNOSIS — I10 Essential (primary) hypertension: Secondary | ICD-10-CM | POA: Diagnosis not present

## 2021-05-21 DIAGNOSIS — I4891 Unspecified atrial fibrillation: Secondary | ICD-10-CM | POA: Diagnosis not present

## 2021-05-21 DIAGNOSIS — I4892 Unspecified atrial flutter: Secondary | ICD-10-CM | POA: Diagnosis not present

## 2021-05-21 DIAGNOSIS — Z79899 Other long term (current) drug therapy: Secondary | ICD-10-CM | POA: Diagnosis not present

## 2021-05-21 DIAGNOSIS — D6869 Other thrombophilia: Secondary | ICD-10-CM | POA: Diagnosis not present

## 2021-05-21 DIAGNOSIS — Z7901 Long term (current) use of anticoagulants: Secondary | ICD-10-CM | POA: Insufficient documentation

## 2021-05-21 DIAGNOSIS — E785 Hyperlipidemia, unspecified: Secondary | ICD-10-CM | POA: Diagnosis not present

## 2021-05-21 DIAGNOSIS — I2510 Atherosclerotic heart disease of native coronary artery without angina pectoris: Secondary | ICD-10-CM | POA: Insufficient documentation

## 2021-05-21 DIAGNOSIS — Z87891 Personal history of nicotine dependence: Secondary | ICD-10-CM | POA: Diagnosis not present

## 2021-05-21 DIAGNOSIS — I4819 Other persistent atrial fibrillation: Secondary | ICD-10-CM | POA: Insufficient documentation

## 2021-05-21 DIAGNOSIS — Z951 Presence of aortocoronary bypass graft: Secondary | ICD-10-CM | POA: Insufficient documentation

## 2021-05-21 HISTORY — PX: CARDIOVERSION: SHX1299

## 2021-05-21 SURGERY — CARDIOVERSION
Anesthesia: General

## 2021-05-21 MED ORDER — LIDOCAINE 2% (20 MG/ML) 5 ML SYRINGE
INTRAMUSCULAR | Status: DC | PRN
  Administered 2021-05-21: 60 mg via INTRAVENOUS

## 2021-05-21 MED ORDER — SODIUM CHLORIDE 0.9 % IV SOLN
INTRAVENOUS | Status: AC | PRN
  Administered 2021-05-21: 500 mL via INTRAVENOUS

## 2021-05-21 MED ORDER — SODIUM CHLORIDE 0.9 % IV SOLN: INTRAVENOUS | Status: DC | PRN

## 2021-05-21 MED ORDER — PROPOFOL 10 MG/ML IV BOLUS
INTRAVENOUS | Status: DC | PRN
  Administered 2021-05-21: 60 mg via INTRAVENOUS

## 2021-05-21 NOTE — Discharge Instructions (Signed)

## 2021-05-21 NOTE — Interval H&P Note (Signed)
History and Physical Interval Note:  05/21/2021 8:19 AM  [name]  has presented today for surgery, with the diagnosis of AFIB.  The various methods of treatment have been discussed with the patient and family. After consideration of risks, benefits and other options for treatment, the patient has consented to  Procedure(s): CARDIOVERSION (N/A) as a surgical intervention.  The patient's history has been reviewed, patient examined, no change in status, stable for surgery.  I have reviewed the patient's chart and labs.  Questions were answered to the patient's satisfaction.     Jenkins Rouge

## 2021-05-21 NOTE — Anesthesia Procedure Notes (Signed)
Procedure Name: General with mask airway Date/Time: 05/21/2021 8:26 AM Performed by: Dorthea Cove, CRNA Pre-anesthesia Checklist: Patient identified, Emergency Drugs available, Suction available, Patient being monitored and Timeout performed Patient Re-evaluated:Patient Re-evaluated prior to induction Oxygen Delivery Method: Non-rebreather mask Preoxygenation: Pre-oxygenation with 100% oxygen Induction Type: IV induction Placement Confirmation: positive ETCO2 and CO2 detector Dental Injury: Teeth and Oropharynx as per pre-operative assessment

## 2021-05-21 NOTE — Anesthesia Postprocedure Evaluation (Signed)
Anesthesia Post Note  Patient: [name]  Procedure(s) Performed: CARDIOVERSION     Patient location during evaluation: Endoscopy Anesthesia Type: General Level of consciousness: awake and alert Pain management: pain level controlled Vital Signs Assessment: post-procedure vital signs reviewed and stable Respiratory status: spontaneous breathing, nonlabored ventilation and respiratory function stable Cardiovascular status: blood pressure returned to baseline and stable Postop Assessment: no apparent nausea or vomiting Anesthetic complications: no   No notable events documented.  Last Vitals:  Vitals:   05/21/21 0848 05/21/21 0858  BP: (!) 98/50 (!) 111/54  Pulse: (!) 57 (!) 54  Resp: 15 11  Temp:    SpO2: 100% 97%    Last Pain:  Vitals:   05/21/21 0858  TempSrc:   PainSc: 0-No pain                 Lidia Collum

## 2021-05-21 NOTE — Transfer of Care (Signed)
Immediate Anesthesia Transfer of Care Note  Patient: [name]  Procedure(s) Performed: CARDIOVERSION  Patient Location: Endoscopy Unit  Anesthesia Type:General  Level of Consciousness: drowsy  Airway & Oxygen Therapy: Patient Spontanous Breathing and Patient connected to face mask oxygen  Post-op Assessment: Report given to RN and Post -op Vital signs reviewed and stable  Post vital signs: Reviewed and stable  Last Vitals:  Vitals Value Taken Time  BP 99/51   Temp    Pulse 58   Resp 12   SpO2 100     Last Pain:  Vitals:   05/21/21 0741  TempSrc: Oral  PainSc: 0-No pain         Complications: No notable events documented.

## 2021-05-21 NOTE — CV Procedure (Signed)
[place]: Anesthesia: Propofol  DCC x 2 150 J biphasic then 200 J biphasic Converted from afib rate 88 to NSR rate 68 bpm  No immediate neurologic sequelae On Rx eliquis with no missed doses  Jenkins Rouge MD [facility]

## 2021-05-22 ENCOUNTER — Other Ambulatory Visit: Payer: Self-pay | Admitting: Neurology

## 2021-05-22 LAB — POCT I-STAT, CHEM 8
BUN: 19 mg/dL (ref 8–23)
Calcium, Ion: 1.02 mmol/L — ABNORMAL LOW (ref 1.15–1.40)
Chloride: 109 mmol/L (ref 98–111)
Creatinine, Ser: 1.6 mg/dL — ABNORMAL HIGH (ref 0.61–1.24)
Glucose, Bld: 105 mg/dL — ABNORMAL HIGH (ref 70–99)
HCT: 40 % (ref 39.0–52.0)
Hemoglobin: 13.6 g/dL (ref 13.0–17.0)
Potassium: 4.9 mmol/L (ref 3.5–5.1)
Sodium: 141 mmol/L (ref 135–145)
TCO2: 25 mmol/L (ref 22–32)

## 2021-05-23 ENCOUNTER — Encounter (HOSPITAL_COMMUNITY): Payer: Self-pay | Admitting: Cardiovascular Disease

## 2021-05-30 ENCOUNTER — Other Ambulatory Visit: Payer: Self-pay | Admitting: Neurology

## 2021-05-30 ENCOUNTER — Ambulatory Visit (HOSPITAL_COMMUNITY)
Admission: RE | Admit: 2021-05-30 | Discharge: 2021-05-30 | Disposition: A | Discharge status: Home / Self Care | Payer: Medicare Other | Source: Ambulatory Visit | Attending: Physician Assistant | Admitting: Physician Assistant

## 2021-05-30 VITALS — BP 138/74 | HR 92 | Ht 67.0 in | Wt 148.8 lb

## 2021-05-30 DIAGNOSIS — G4733 Obstructive sleep apnea (adult) (pediatric): Secondary | ICD-10-CM | POA: Insufficient documentation

## 2021-05-30 DIAGNOSIS — Z8249 Family history of ischemic heart disease and other diseases of the circulatory system: Secondary | ICD-10-CM | POA: Insufficient documentation

## 2021-05-30 DIAGNOSIS — I4892 Unspecified atrial flutter: Secondary | ICD-10-CM | POA: Diagnosis not present

## 2021-05-30 DIAGNOSIS — D6869 Other thrombophilia: Secondary | ICD-10-CM | POA: Diagnosis not present

## 2021-05-30 DIAGNOSIS — I4819 Other persistent atrial fibrillation: Secondary | ICD-10-CM | POA: Insufficient documentation

## 2021-05-30 DIAGNOSIS — I2510 Atherosclerotic heart disease of native coronary artery without angina pectoris: Secondary | ICD-10-CM | POA: Diagnosis not present

## 2021-05-30 DIAGNOSIS — Z7901 Long term (current) use of anticoagulants: Secondary | ICD-10-CM | POA: Diagnosis not present

## 2021-05-30 DIAGNOSIS — Z87891 Personal history of nicotine dependence: Secondary | ICD-10-CM | POA: Insufficient documentation

## 2021-05-30 DIAGNOSIS — I110 Hypertensive heart disease with heart failure: Secondary | ICD-10-CM | POA: Diagnosis not present

## 2021-05-30 MED ORDER — TRAZODONE HCL 50 MG PO TABS: 50.0000 mg | ORAL_TABLET | Freq: Every day | ORAL | 0 refills | Status: DC

## 2021-05-30 NOTE — Progress Notes (Signed)
Primary Care Physician: Burman Freestone, MD Primary Cardiologist: Dr Radford Pax Primary Electrophysiologist: Dr Curt Bears Referring Physician: Dr Jules Husbands is a 75 y.o. male with a history of CAD with CABG in 1998, bicuspid AV with moderate to severe AI and dilated aortic root followed by Dr. Clementeen Graham at [facility], dyslipidemia, HTN, OSA, atrial flutter who presents for follow up in the [facility].  The patient was initially diagnosed with atrial flutter after presented to [facility] ER with symptoms of palpitations. He underwent successful DCCV. He is on Eliquis for a CHADS2VASC score of 3. Patient saw Dr Radford Pax in follow up on 06/22/19 and reported that he was having increased palpitations. A 30 day event monitor was placed. Which did show an episode of atrial flutter with HR 120. Patient reports that he did have symptoms of palpitations for about 1 hour which correspond to the event. He denies any significant alcohol use. There were no specific triggers that he could identify. He is s/p atrial flutter ablation on 09/29/19 with Dr Curt Bears.  He was seen as an add on to Dr Antionette Char schedule on 04/27/21 and was in new onset afib. He had felt some palpitations and his home monitor showed an irregular heart beat. He was started on Eliquis and his BB was increased.   On follow up today, patient is s/p DCCV on 05/21/21 but is unfortunately back in afib today. He reports he went into afib on 11/6. There were no specific triggers that he could identify.   Today, he denies symptoms of chest pain, shortness of breath, orthopnea, PND, lower extremity edema, dizziness, presyncope, syncope, snoring, daytime somnolence, bleeding, or neurologic sequela. The patient is tolerating medications without difficulties and is otherwise without complaint today.    Atrial Fibrillation Risk Factors:  he does have symptoms or diagnosis of sleep apnea. he is compliant with CPAP  therapy. he does not have a history of rheumatic fever. he does not have a history of alcohol use. The patient does not have a history of early familial atrial fibrillation or other arrhythmias.  he has a BMI of Body mass index is 23.31 kg/m.Marland Kitchen Filed Weights   05/30/21 1421  Weight: 67.5 kg     Family History  Problem Relation Age of Onset   Arthritis Mother        deceased   Hyperlipidemia Mother    Diabetes Mother    Arthritis Father    Hyperlipidemia Father    Heart disease Father 66   Stroke Father 56       deceased   Hypertension Father    Diabetes Father    Kidney disease Paternal Grandfather    Colon cancer Neg Hx    Colon polyps Neg Hx    Esophageal cancer Neg Hx    Stomach cancer Neg Hx    Rectal cancer Neg Hx      Atrial Fibrillation Management history:  Previous antiarrhythmic drugs: amiodarone Previous cardioversions: 04/2019 [facility]), 05/21/21 Previous ablations: 09/29/19 CHADS2VASC score: 3 Anticoagulation history: Eliquis   Past Medical History:  Diagnosis Date   Anal fissure    Aortic insufficiency    Arthritis    Atrial flutter (HCC)    Bicuspid aortic valve    with mild to moderate AR by echo 08/2019   BPH (benign prostatic hyperplasia)    CAD (coronary artery disease)    s/p CABG [facility]   Cataract    bilateral-removed  CKD (chronic kidney disease), stage III (HCC)    Depression    Dilated aortic root (HCC)    History of blood transfusion 1998   "due to cardiac bypass surgery"   Hyperlipidemia    Hypertension    Hypogonadism in male    Kidney stones    Migraine    Mild carotid artery disease ([place]) 2009   Myocardial infarction ([place])    PVC's (premature ventricular contractions)    Retinal hemorrhage 11/04/2014   Sleep apnea with use of continuous positive airway pressure (CPAP)    Thoracic aortic aneurysm (TAA)    Past Surgical History:  Procedure Laterality Date   A-FLUTTER ABLATION N/A 09/29/2019   Procedure:  A-FLUTTER ABLATION;  Surgeon: Constance Haw, MD;  Location: [place] CV LAB;  Service: Cardiovascular;  Laterality: N/A;   CARDIOVERSION N/A 08/10/2019   Procedure: CARDIOVERSION;  Surgeon: Elouise Munroe, MD;  Location: [facility] ENDOSCOPY;  Service: Cardiovascular;  Laterality: N/A;   CARDIOVERSION N/A 05/21/2021   Procedure: CARDIOVERSION;  Surgeon: Josue Hector, MD;  Location: [facility] ENDOSCOPY;  Service: Cardiovascular;  Laterality: N/A;   cataracts     COLONOSCOPY     CORONARY ARTERY BYPASS GRAFT  1998   LEFT HEART CATH AND CORONARY ANGIOGRAPHY N/A 08/24/2018   Procedure: LEFT HEART CATH AND CORONARY ANGIOGRAPHY;  Surgeon: Lorretta Harp, MD;  Location: [place] CV LAB;  Service: Cardiovascular;  Laterality: N/A;   TEE WITHOUT CARDIOVERSION N/A 01/19/2015   Procedure: TRANSESOPHAGEAL ECHOCARDIOGRAM (TEE);  Surgeon: Sueanne Margarita, MD;  Location: [facility] ENDOSCOPY;  Service: Cardiovascular;  Laterality: N/A;   [place]   pt denies    Current Outpatient Medications  Medication Sig Dispense Refill   acetaminophen (TYLENOL) 325 MG tablet Take 650 mg by mouth every 6 (six) hours as needed for mild pain or moderate pain.     ALPRAZOLAM XR 1 MG 24 hr tablet TAKE ONE TABLET BY MOUTH DAILY (Patient taking differently: Take 1 mg by mouth every evening.) 90 tablet 1   amLODipine (NORVASC) 10 MG tablet Take 1 tablet (10 mg total) by mouth daily. 90 tablet 3   apixaban (ELIQUIS) 5 MG TABS tablet Take 1 tablet (5 mg total) by mouth 2 (two) times daily. 60 tablet 11   Ascorbic Acid (VITAMIN C) 1000 MG tablet Take 1,000 mg by mouth 2 (two) times daily.     aspirin-acetaminophen-caffeine (EXCEDRIN MIGRAINE) 250-250-65 MG tablet Take 2 tablets by mouth daily as needed for headache.      butalbital-aspirin-caffeine-codeine (FIORINAL WITH CODEINE) 50-325-40-30 MG capsule Prn use for break through migraine 30 capsule 5   diazepam (VALIUM) 5 MG tablet Take 2.5-5 mg by mouth 2 (two)  times daily as needed for anxiety.      ibuprofen (ADVIL,MOTRIN) 200 MG tablet Take 200-400 mg by mouth as needed (for arthritis in the feet).     losartan (COZAAR) 25 MG tablet Take 1 tablet (25 mg total) by mouth daily. 90 tablet 3   nebivolol (BYSTOLIC) 10 MG tablet Take 10 mg by mouth in the morning and at bedtime.     niacin 500 MG tablet Take 500 mg by mouth at bedtime.     rosuvastatin (CRESTOR) 20 MG tablet Take 1 tablet (20 mg total) by mouth daily. (Patient taking differently: Take 20 mg by mouth every evening.) 90 tablet 3   Specialty Vitamins Products (CENTRUM SPECIALIST ENERGY) TABS Take 1 tablet by mouth daily with breakfast.  tadalafil (CIALIS) 5 MG tablet Take 5 mg by mouth every evening.     Testosterone 40.5 MG/2.5GM (1.62%) GEL Place 4 Pump onto the skin daily. Apply 4 pumps to each shoulder every morning     [START ON 06/20/2021] traZODone (DESYREL) 50 MG tablet Take 1 tablet (50 mg total) by mouth at bedtime. 30 tablet 0   Turmeric 500 MG CAPS Take 500 mg by mouth daily.     vitamin E 400 UNIT capsule Take 400 Units by mouth at bedtime.      Nebivolol HCl (BYSTOLIC) 20 MG TABS Take 1 tablet (20 mg total) by mouth daily. (Patient not taking: Reported on 05/30/2021) 90 tablet 3   Current Facility-Administered Medications  Medication Dose Route Frequency Provider Last Rate Last Admin   0.9 %  sodium chloride infusion  500 mL Intravenous Once Thornton Park, MD       0.9 %  sodium chloride infusion  500 mL Intravenous Once Armbruster, Carlota Raspberry, MD        Allergies  Allergen Reactions   Dog Epithelium Allergy Skin Test Itching, Anxiety, Palpitations and Other (See Comments)    Other reaction(s): Respiratory Distress (ALLERGY/intolerance), Wheezing (ALLERGY/intolerance)   Mushroom Extract Complex Nausea Only and Other (See Comments)    Severe vertigo and headaches    Social History   Socioeconomic History   Marital status: Divorced    Spouse name: Not on file    Number of children: 0   Years of education: Not on file   Highest education level: Not on file  Occupational History   Occupation: retired  Tobacco Use   Smoking status: Former    Years: 20.00    Types: Cigarettes    Quit date: 07/22/1986    Years since quitting: 34.8   Smokeless tobacco: Never   Tobacco comments:    Former smoker 05/09/2021  Vaping Use   Vaping Use: Never used  Substance and Sexual Activity   Alcohol use: Yes    Alcohol/week: 1.0 standard drink    Types: 1 Glasses of wine per week    Comment: 0-1 daily   Drug use: No   Sexual activity: Not on file  Other Topics Concern   Not on file  Social History Narrative   Divorced   Secondary school teacher- retired   No children   Has a Neurosurgeon named [name]   Enjoys outdoor activities- hiking, swimming, Control and instrumentation engineer, baseball, writing, reading, cooking   Social Determinants of Radio broadcast assistant Strain: Not on file  Food Insecurity: Not on file  Transportation Needs: Not on file  Physical Activity: Not on file  Stress: Not on file  Social Connections: Not on file  Intimate Partner Violence: Not on file     ROS- All systems are reviewed and negative except as per the HPI above.  Physical Exam: Vitals:   05/30/21 1421  BP: 138/74  Pulse: 92  Weight: 67.5 kg  Height: 5\' 7"  (1.702 m)    GEN- The patient is a well appearing male, alert and oriented x 3 today.   HEENT-head normocephalic, atraumatic, sclera clear, conjunctiva pink, hearing intact, trachea midline. Lungs- Clear to ausculation bilaterally, normal work of breathing Heart- irregular rate and rhythm, no murmurs, rubs or gallops  GI- soft, NT, ND, + BS Extremities- no clubbing, cyanosis, or edema MS- no significant deformity or atrophy Skin- no rash or lesion Psych- euthymic mood, full affect Neuro- strength and sensation are intact   Wt Readings from Last  3 Encounters:  05/30/21 67.5 kg  05/21/21 69.2 kg  05/09/21 69.2 kg    EKG today  demonstrates  Afib, PVCs Vent. rate 92 BPM PR interval * ms QRS duration 80 ms QT/QTcB 358/442 ms  Echo 08/28/20 demonstrated  1. Aorta measurements are stable compared with prior study 09/07/2019. Aortic dilatation noted. Aneurysm of the aortic root, measuring 51 mm. 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   2. Tricuspid aortic valve (clearly tricuspid on TEE 2016). Mild to  moderate eccentric AI related to incomplete leaflet closure from aortic  root aneurysm. The aortic valve is tricuspid. Aortic valve regurgitation  is mild to moderate. No aortic stenosis  is present.   3. Left ventricular ejection fraction, by estimation, is 60 to 65%. The  left ventricle has normal function. The left ventricle has no regional  wall motion abnormalities. There is mild concentric left ventricular  hypertrophy. Left ventricular diastolic parameters are indeterminate.   4. Right ventricular systolic function is normal. The right ventricular  size is normal. There is normal pulmonary artery systolic pressure. The estimated right ventricular systolic pressure is 95.6 mmHg.   5. The mitral valve is grossly normal. No evidence of mitral valve  regurgitation. No evidence of mitral stenosis.   6. The inferior vena cava is normal in size with greater than 50%  respiratory variability, suggesting right atrial pressure of 3 mmHg.   Comparison(s): No significant change from prior study.   Epic records are reviewed at length today  CHA2DS2-VASc Score = 3  The patient's score is based upon: CHF History: 0 HTN History: 1 Diabetes History: 0 Stroke History: 0 Vascular Disease History: 1 Age Score: 1 Gender Score: 0       ASSESSMENT AND PLAN: 1. Persistent Atrial Fibrillation/atrial flutter The patient's CHA2DS2-VASc score is 3, indicating a 3.2% annual risk of stroke.   S/p flutter ablation 09/29/19 S/p DCCV on 05/21/21 with early return of afib. We discussed rhythm control options today  including AAD and ablation. Avoid class IC with h/o CAD and would prefer to avoid long term amiodarone give his young age. Patient agreeable to discussing afib ablation with Dr Curt Bears.  Continue Eliquis 5 mg BID Continue nebivolol 20 mg daily  2. Secondary Hypercoagulable State (ICD10:  D68.69) The patient is at significant risk for stroke/thromboembolism based upon his CHA2DS2-VASc Score of 3.  Continue Apixaban (Eliquis).   3. Obstructive sleep apnea Patient reports compliance with CPAP therapy.  4. HTN Stable, no changes today.  5. CAD No anginal symptoms.    Follow up with Dr Curt Bears to discuss possible ablation.    [facility] [address] [place], [place] [address] [phone] 05/30/2021 2:52 PM

## 2021-06-05 ENCOUNTER — Encounter (INDEPENDENT_AMBULATORY_CARE_PROVIDER_SITE_OTHER): Payer: Self-pay | Admitting: Ophthalmology

## 2021-06-05 ENCOUNTER — Ambulatory Visit (INDEPENDENT_AMBULATORY_CARE_PROVIDER_SITE_OTHER): Payer: Medicare Other | Admitting: Ophthalmology

## 2021-06-05 ENCOUNTER — Other Ambulatory Visit: Payer: Self-pay

## 2021-06-05 DIAGNOSIS — I2510 Atherosclerotic heart disease of native coronary artery without angina pectoris: Secondary | ICD-10-CM | POA: Diagnosis not present

## 2021-06-05 DIAGNOSIS — H35073 Retinal telangiectasis, bilateral: Secondary | ICD-10-CM

## 2021-06-05 DIAGNOSIS — H353132 Nonexudative age-related macular degeneration, bilateral, intermediate dry stage: Secondary | ICD-10-CM

## 2021-06-05 MED ORDER — FLUORESCEIN SODIUM 10 % IV SOLN
500.0000 mg | INTRAVENOUS | Status: AC | PRN
Start: 2021-06-05 — End: 2021-06-05
  Administered 2021-06-05: 500 mg via INTRAVENOUS

## 2021-06-05 NOTE — Assessment & Plan Note (Signed)
No sign of wet AMD

## 2021-06-05 NOTE — Progress Notes (Signed)
06/05/2021     CHIEF COMPLAINT Patient presents for  Chief Complaint  Patient presents with   Retina Follow Up      HISTORY OF PRESENT ILLNESS: [name] is a 75 y.o. male who presents to the clinic today for:   HPI     Retina Follow Up   Patient presents with  Other.  In both eyes.  This started 4 weeks ago.  Severity is mild.  Duration of 4 weeks.  Since onset it is stable.        Comments   4 weeks FP/ FFA r/l OCT. Patient states vision is stable and unchanged since last visit. Denies any new floaters or FOL.        Last edited by Laurin Coder on 06/05/2021 10:42 AM.      Referring physician: Burman Freestone, MD [address],  [place] [address]  HISTORICAL INFORMATION:   Selected notes from the MEDICAL RECORD NUMBER    Lab Results  Component Value Date   HGBA1C 5.9 (H) 08/22/2018     CURRENT MEDICATIONS: No current outpatient medications on file. (Ophthalmic Drugs)   No current facility-administered medications for this visit. (Ophthalmic Drugs)   Current Outpatient Medications (Other)  Medication Sig   acetaminophen (TYLENOL) 325 MG tablet Take 650 mg by mouth every 6 (six) hours as needed for mild pain or moderate pain.   ALPRAZOLAM XR 1 MG 24 hr tablet TAKE ONE TABLET BY MOUTH DAILY (Patient taking differently: Take 1 mg by mouth every evening.)   amLODipine (NORVASC) 10 MG tablet Take 1 tablet (10 mg total) by mouth daily.   apixaban (ELIQUIS) 5 MG TABS tablet Take 1 tablet (5 mg total) by mouth 2 (two) times daily.   Ascorbic Acid (VITAMIN C) 1000 MG tablet Take 1,000 mg by mouth 2 (two) times daily.   aspirin-acetaminophen-caffeine (EXCEDRIN MIGRAINE) 250-250-65 MG tablet Take 2 tablets by mouth daily as needed for headache.    butalbital-aspirin-caffeine-codeine (FIORINAL WITH CODEINE) 50-325-40-30 MG capsule Prn use for break through migraine   diazepam (VALIUM) 5 MG tablet Take 2.5-5 mg by mouth 2 (two) times daily as  needed for anxiety.    ibuprofen (ADVIL,MOTRIN) 200 MG tablet Take 200-400 mg by mouth as needed (for arthritis in the feet).   losartan (COZAAR) 25 MG tablet Take 1 tablet (25 mg total) by mouth daily.   nebivolol (BYSTOLIC) 10 MG tablet Take 10 mg by mouth in the morning and at bedtime.   Nebivolol HCl (BYSTOLIC) 20 MG TABS Take 1 tablet (20 mg total) by mouth daily. (Patient not taking: Reported on 05/30/2021)   niacin 500 MG tablet Take 500 mg by mouth at bedtime.   rosuvastatin (CRESTOR) 20 MG tablet Take 1 tablet (20 mg total) by mouth daily. (Patient taking differently: Take 20 mg by mouth every evening.)   Specialty Vitamins Products (CENTRUM SPECIALIST ENERGY) TABS Take 1 tablet by mouth daily with breakfast.   tadalafil (CIALIS) 5 MG tablet Take 5 mg by mouth every evening.   Testosterone 40.5 MG/2.5GM (1.62%) GEL Place 4 Pump onto the skin daily. Apply 4 pumps to each shoulder every morning   [START ON 06/20/2021] traZODone (DESYREL) 50 MG tablet Take 1 tablet (50 mg total) by mouth at bedtime.   Turmeric 500 MG CAPS Take 500 mg by mouth daily.   vitamin E 400 UNIT capsule Take 400 Units by mouth at bedtime.    Current Facility-Administered Medications (Other)  Medication Route   0.9 %  sodium chloride infusion Intravenous   0.9 %  sodium chloride infusion Intravenous      REVIEW OF SYSTEMS:    ALLERGIES Allergies  Allergen Reactions   Dog Epithelium Allergy Skin Test Itching, Anxiety, Palpitations and Other (See Comments)    Other reaction(s): Respiratory Distress (ALLERGY/intolerance), Wheezing (ALLERGY/intolerance)   Mushroom Extract Complex Nausea Only and Other (See Comments)    Severe vertigo and headaches    PAST MEDICAL HISTORY Past Medical History:  Diagnosis Date   Anal fissure    Aortic insufficiency    Arthritis    Atrial flutter (HCC)    Bicuspid aortic valve    with mild to moderate AR by echo 08/2019   BPH (benign prostatic hyperplasia)    CAD  (coronary artery disease)    s/p CABG [facility] [place] [facility]   Cataract    bilateral-removed   CKD (chronic kidney disease), stage III ([place])    Depression    Dilated aortic root (HCC)    History of blood transfusion 1998   "due to cardiac bypass surgery"   Hyperlipidemia    Hypertension    Hypogonadism in male    Kidney stones    Migraine    Mild carotid artery disease ([place]) 2009   Myocardial infarction ([place])    PVC's (premature ventricular contractions)    Retinal hemorrhage 11/04/2014   Sleep apnea with use of continuous positive airway pressure (CPAP)    Thoracic aortic aneurysm (TAA)    Past Surgical History:  Procedure Laterality Date   A-FLUTTER ABLATION N/A 09/29/2019   Procedure: A-FLUTTER ABLATION;  Surgeon: Constance Haw, MD;  Location: [facility];  Service: Cardiovascular;  Laterality: N/A;   CARDIOVERSION N/A 08/10/2019   Procedure: CARDIOVERSION;  Surgeon: Elouise Munroe, MD;  Location: [facility];  Service: Cardiovascular;  Laterality: N/A;   CARDIOVERSION N/A 05/21/2021   Procedure: CARDIOVERSION;  Surgeon: Josue Hector, MD;  Location: [facility] ENDOSCOPY;  Service: Cardiovascular;  Laterality: N/A;   cataracts     COLONOSCOPY     CORONARY ARTERY BYPASS GRAFT  1998   LEFT HEART CATH AND CORONARY ANGIOGRAPHY N/A 08/24/2018   Procedure: LEFT HEART CATH AND CORONARY ANGIOGRAPHY;  Surgeon: Lorretta Harp, MD;  Location: [facility];  Service: Cardiovascular;  Laterality: N/A;   TEE WITHOUT CARDIOVERSION N/A 01/19/2015   Procedure: TRANSESOPHAGEAL ECHOCARDIOGRAM (TEE);  Surgeon: Sueanne Margarita, MD;  Location: [facility] ENDOSCOPY;  Service: Cardiovascular;  Laterality: N/A;   [place]   pt denies    FAMILY HISTORY Family History  Problem Relation Age of Onset   Arthritis Mother        deceased   Hyperlipidemia Mother    Diabetes Mother    Arthritis Father    Hyperlipidemia Father    Heart disease Father 66   Stroke Father 65        deceased   Hypertension Father    Diabetes Father    Kidney disease Paternal Grandfather    Colon cancer Neg Hx    Colon polyps Neg Hx    Esophageal cancer Neg Hx    Stomach cancer Neg Hx    Rectal cancer Neg Hx     SOCIAL HISTORY Social History   Tobacco Use   Smoking status: Former    Years: 20.00    Types: Cigarettes    Quit date: 07/22/1986    Years since quitting: 34.8   Smokeless  tobacco: Never   Tobacco comments:    Former smoker 05/09/2021  Vaping Use   Vaping Use: Never used  Substance Use Topics   Alcohol use: Yes    Alcohol/week: 1.0 standard drink    Types: 1 Glasses of wine per week    Comment: 0-1 daily   Drug use: No         OPHTHALMIC EXAM:  Base Eye Exam     Visual Acuity (ETDRS)       Right Left   Dist [place] 20/60 -2+2 20/40 -1   Dist ph [place] 20/30 -2 NI         Tonometry (Tonopen, 10:47 AM)       Right Left   Pressure 15 14         Pupils       Pupils Dark Light Shape React APD   Right PERRL 4 3 Round Brisk None   Left PERRL 4 3 Round Brisk None         Extraocular Movement       Right Left    Full Full         Neuro/Psych     Oriented x3: Yes   Mood/Affect: Normal         Dilation     Both eyes: 1.0% Mydriacyl, 2.5% Phenylephrine @ 10:47 AM           Slit Lamp and Fundus Exam     External Exam       Right Left   External Normal Normal         Slit Lamp Exam       Right Left   Lids/Lashes Normal Normal   Conjunctiva/Sclera White and quiet White and quiet   Cornea Clear Clear   Anterior Chamber Deep and quiet Deep and quiet   Iris Round and reactive Round and reactive   Lens Centered posterior chamber intraocular lens Centered posterior chamber intraocular lens   Anterior Vitreous Normal Normal            IMAGING AND PROCEDURES  Imaging and Procedures for 06/05/21  OCT, Retina - OU - Both Eyes       Right Eye Quality was good. Scan locations included subfoveal. Central Foveal  Thickness: 242. Progression has been stable. Findings include subretinal hyper-reflective material, vitreomacular adhesion .   Left Eye Quality was good. Scan locations included subfoveal. Central Foveal Thickness: 161. Progression has improved. Findings include subretinal hyper-reflective material, outer retinal atrophy, vitreomacular adhesion .   Notes OD with progressive cavitary atrophy temporal portion of FAZ yet intact outer retina, cavitary changes OD may be chronic see fluorescein angiography where there is no leakage ongoing perifoveal OD  OS with outer retinal distortion and now collapse of the outer retinal cavitary area or serous detachment noted some 6 years previous.     Color Fundus Photography Optos - OU - Both Eyes       Right Eye Progression has been stable. Disc findings include normal observations. Macula : retinal pigment epithelium abnormalities, microaneurysms. Vessels : normal observations.   Left Eye Progression has been stable. Macula : retinal pigment epithelium abnormalities, microaneurysms. Vessels : normal observations. Periphery : normal observations.   Notes History of MAC-TEL with RPE abnormalities and old exudates temporal left eye and RPE changes Temporally OD     Fluorescein Angiography Optos (Transit OD)       Injection: 500 mg Fluorescein Sodium 10 %   Route: Intravenous   NDC:  [phone]   Right Eye   Progression has been stable. Early phase findings include leakage, microaneurysm. Mid/Late phase findings include microaneurysm.   Left Eye   Progression has been stable. Early phase findings include microaneurysm. Choroidal neovascularization is not present.   Notes Classic type II MAC-TEL with temporal microaneurysms and microvascular leakage but very minor and almost discrete areas in the left eye and very minor old damage in the right eye vascular remodeling but no accumulation angiographically of CME OD, thus correlating with OCT  the old cleft seen on OCT perifoveal OD  are chronic and not Active leakage.             ASSESSMENT/PLAN:  Type 2 macular telangiectasis, bilateral OU, no active leakage OD to correspond with a chronic cavitary lesion seen by OCT but no active FFA leakage, thus CPAP is still functionally beneficial preventing MAC-TEL progression OD  OS FA discloses vascular remodeling yet this is the area without accumulated thickening and in resolution completely of outer retinal serous elevation noted in 2016 prior to diagnosis of sleep apnea and prior to use of CPAP therapy  Since that time since 2019, no progression of disease OS no active leakage only old previously vascular remodeling without leakage seen on FFA  Intermediate stage nonexudative age-related macular degeneration of both eyes No sign of wet AMD     ICD-10-CM   1. Retinal telangiectasia of both eyes  H35.073 OCT, Retina - OU - Both Eyes    2. Intermediate stage nonexudative age-related macular degeneration of both eyes  H35.3132 OCT, Retina - OU - Both Eyes    Color Fundus Photography Optos - OU - Both Eyes    Fluorescein Angiography Optos (Transit OD)    Fluorescein Sodium 10 % injection 500 mg    3. Type 2 macular telangiectasis, bilateral  H35.073       1.  OU, stable maculopathy on successful sleep apnea therapy with CPAP.  No progression since onset of therapy and his macular condition  2.  3.  Ophthalmic Meds Ordered this visit:  Meds ordered this encounter  Medications   Fluorescein Sodium 10 % injection 500 mg       Return in about 1 year (around 06/05/2022) for DILATE OU, COLOR FP, OCT.  There are no Patient Instructions on file for this visit.   Explained the diagnoses, plan, and follow up with the patient and they expressed understanding.  Patient expressed understanding of the importance of proper follow up care.   Clent Demark Shravan Salahuddin M.D. Diseases & Surgery of the Retina and Vitreous Retina [facility] Di[facility]  [place] 06/05/21     Abbreviations: M myopia (nearsighted); A astigmatism; H hyperopia (farsighted); P presbyopia; Mrx spectacle prescription;  CTL contact lenses; OD right eye; OS left eye; OU both eyes  XT exotropia; ET esotropia; PEK punctate epithelial keratitis; PEE punctate epithelial erosions; DES dry eye syndrome; MGD meibomian gland dysfunction; ATs artificial tears; PFAT's preservative free artificial tears; [place] nuclear sclerotic cataract; PSC posterior subcapsular cataract; ERM epi-retinal membrane; PVD posterior vitreous detachment; RD retinal detachment; DM diabetes mellitus; DR diabetic retinopathy; NPDR non-proliferative diabetic retinopathy; PDR proliferative diabetic retinopathy; CSME clinically significant macular edema; DME diabetic macular edema; dbh dot blot hemorrhages; CWS cotton wool spot; POAG primary open angle glaucoma; C/D cup-to-disc ratio; HVF humphrey visual field; GVF goldmann visual field; OCT optical coherence tomography; IOP intraocular pressure; BRVO Branch retinal vein occlusion; CRVO central retinal vein occlusion; CRAO central retinal artery occlusion; BRAO branch retinal  artery occlusion; RT retinal tear; SB scleral buckle; PPV pars plana vitrectomy; VH Vitreous hemorrhage; PRP panretinal laser photocoagulation; IVK intravitreal kenalog; VMT vitreomacular traction; MH Macular hole;  NVD neovascularization of the disc; NVE neovascularization elsewhere; AREDS age related eye disease study; ARMD age related macular degeneration; POAG primary open angle glaucoma; EBMD epithelial/anterior basement membrane dystrophy; ACIOL anterior chamber intraocular lens; IOL intraocular lens; PCIOL posterior chamber intraocular lens; Phaco/IOL phacoemulsification with intraocular lens placement; [place] photorefractive keratectomy; LASIK laser assisted in situ keratomileusis; HTN hypertension; DM diabetes mellitus; COPD chronic obstructive pulmonary disease

## 2021-06-05 NOTE — Assessment & Plan Note (Signed)
OU, no active leakage OD to correspond with a chronic cavitary lesion seen by OCT but no active FFA leakage, thus CPAP is still functionally beneficial preventing MAC-TEL progression OD  OS FA discloses vascular remodeling yet this is the area without accumulated thickening and in resolution completely of outer retinal serous elevation noted in 2016 prior to diagnosis of sleep apnea and prior to use of CPAP therapy  Since that time since 2019, no progression of disease OS no active leakage only old previously vascular remodeling without leakage seen on FFA

## 2021-06-26 ENCOUNTER — Encounter: Payer: Self-pay | Admitting: Cardiology

## 2021-06-26 ENCOUNTER — Ambulatory Visit (INDEPENDENT_AMBULATORY_CARE_PROVIDER_SITE_OTHER): Payer: Medicare Other | Admitting: Cardiology

## 2021-06-26 ENCOUNTER — Other Ambulatory Visit: Payer: Self-pay

## 2021-06-26 VITALS — BP 120/82 | HR 114 | Ht 66.5 in | Wt 148.8 lb

## 2021-06-26 DIAGNOSIS — I2510 Atherosclerotic heart disease of native coronary artery without angina pectoris: Secondary | ICD-10-CM

## 2021-06-26 DIAGNOSIS — I4819 Other persistent atrial fibrillation: Secondary | ICD-10-CM | POA: Diagnosis not present

## 2021-06-26 MED ORDER — AMIODARONE HCL 200 MG PO TABS: ORAL_TABLET | ORAL | 3 refills | Status: DC

## 2021-06-26 NOTE — H&P (View-Only) (Signed)
Electrophysiology Office Note   Date:  06/27/2021   ID:  [name], DOB Oct 12, 1945, MRN [id number]  PCP:  Burman Freestone, MD  Cardiologist: Radford Pax Primary Electrophysiologist:  Emelly Wurtz Meredith Leeds, MD    Chief Complaint: Atrial flutter   History of Present Illness: [name] is a 75 y.o. male who is being seen today for the evaluation of atrial flutter at the request of Burman Freestone, MD. Presenting today for electrophysiology evaluation.  He has a history significant coronary artery disease status post CABG 1998, bicuspid aortic valve with moderate to severe AI, dilated aortic root, hypertension, hyperlipidemia, OSA, atrial flutter.  He was having increasing palpitations.  30-day monitor showed episodes of atrial flutter with heart rates in the 120s.  He is now status post atrial flutter ablation on 09/29/2019.  Unfortunately, he came back to cardiology clinic in atrial fibrillation.  He had a cardioversion 05/21/2021 but unfortunately went back into atrial fibrillation on 05/27/2021.  He cannot identify any specific triggers.  Today, denies symptoms of palpitations, chest pain, orthopnea, PND, lower extremity edema, claudication, dizziness, presyncope, syncope, bleeding, or neurologic sequela. The patient is tolerating medications without difficulties.  Unfortunately, when he went into atrial fibrillation he has symptoms of fatigue and shortness of breath.  After his atrial flutter ablation he was doing quite well up until this past October.  He went into atrial fibrillation.  He has been feeling quite poorly since going into atrial fibrillation.   Past Medical History:  Diagnosis Date   Anal fissure    Aortic insufficiency    Arthritis    Atrial flutter (HCC)    Bicuspid aortic valve    with mild to moderate AR by echo 08/2019   BPH (benign prostatic hyperplasia)    CAD (coronary artery disease)    s/p CABG [employer] [place]   Cataract    bilateral-removed   CKD  (chronic kidney disease), stage III ([place])    Depression    Dilated aortic root (HCC)    History of blood transfusion 1998   "due to cardiac bypass surgery"   Hyperlipidemia    Hypertension    Hypogonadism in male    Kidney stones    Migraine    Mild carotid artery disease ([place]) 2009   Myocardial infarction ([place])    PVC's (premature ventricular contractions)    Retinal hemorrhage 11/04/2014   Sleep apnea with use of continuous positive airway pressure (CPAP)    Thoracic aortic aneurysm (TAA)    Past Surgical History:  Procedure Laterality Date   A-FLUTTER ABLATION N/A 09/29/2019   Procedure: A-FLUTTER ABLATION;  Surgeon: Constance Haw, MD;  Location: [place] [facility];  Service: Cardiovascular;  Laterality: N/A;   CARDIOVERSION N/A 08/10/2019   Procedure: CARDIOVERSION;  Surgeon: Elouise Munroe, MD;  Location: [facility] ENDOSCOPY;  Service: Cardiovascular;  Laterality: N/A;   CARDIOVERSION N/A 05/21/2021   Procedure: CARDIOVERSION;  Surgeon: Josue Hector, MD;  Location: [facility] ENDOSCOPY;  Service: Cardiovascular;  Laterality: N/A;   cataracts     COLONOSCOPY     CORONARY ARTERY BYPASS GRAFT  1998   LEFT HEART CATH AND CORONARY ANGIOGRAPHY N/A 08/24/2018   Procedure: LEFT HEART CATH AND CORONARY ANGIOGRAPHY;  Surgeon: Lorretta Harp, MD;  Location: [place] [facility];  Service: Cardiovascular;  Laterality: N/A;   TEE WITHOUT CARDIOVERSION N/A 01/19/2015   Procedure: TRANSESOPHAGEAL ECHOCARDIOGRAM (TEE);  Surgeon: Sueanne Margarita, MD;  Location: [place];  Service: Cardiovascular;  Laterality: N/A;   [place]   pt denies     Current Outpatient Medications  Medication Sig Dispense Refill   acetaminophen (TYLENOL) 325 MG tablet Take 650 mg by mouth every 6 (six) hours as needed for mild pain or moderate pain.     ALPRAZOLAM XR 1 MG 24 hr tablet TAKE ONE TABLET BY MOUTH DAILY (Patient taking differently: Take 1 mg by mouth every evening.) 90 tablet 1    amiodarone (PACERONE) 200 MG tablet Take 2 tablets (400 mg total) TWICE daily for two weeks, then reduce and take 1 tablet (200 mg total) ONCE a day 60 tablet 3   amLODipine (NORVASC) 10 MG tablet Take 1 tablet (10 mg total) by mouth daily. 90 tablet 3   apixaban (ELIQUIS) 5 MG TABS tablet Take 1 tablet (5 mg total) by mouth 2 (two) times daily. 60 tablet 11   Ascorbic Acid (VITAMIN C) 1000 MG tablet Take 1,000 mg by mouth 2 (two) times daily.     aspirin-acetaminophen-caffeine (EXCEDRIN MIGRAINE) 250-250-65 MG tablet Take 2 tablets by mouth daily as needed for headache.      butalbital-aspirin-caffeine-codeine (FIORINAL WITH CODEINE) 50-325-40-30 MG capsule Prn use for break through migraine 30 capsule 5   diazepam (VALIUM) 5 MG tablet Take 2.5-5 mg by mouth 2 (two) times daily as needed for anxiety.      ibuprofen (ADVIL,MOTRIN) 200 MG tablet Take 200-400 mg by mouth as needed (for arthritis in the feet).     losartan (COZAAR) 25 MG tablet Take 1 tablet (25 mg total) by mouth daily. 90 tablet 3   Nebivolol HCl (BYSTOLIC) 20 MG TABS Take 1 tablet (20 mg total) by mouth daily. 90 tablet 3   niacin 500 MG tablet Take 500 mg by mouth at bedtime.     rosuvastatin (CRESTOR) 20 MG tablet Take 1 tablet (20 mg total) by mouth daily. (Patient taking differently: Take 20 mg by mouth every evening.) 90 tablet 3   Specialty Vitamins Products (CENTRUM SPECIALIST ENERGY) TABS Take 1 tablet by mouth daily with breakfast.     tadalafil (CIALIS) 5 MG tablet Take 5 mg by mouth every evening.     Testosterone 40.5 MG/2.5GM (1.62%) GEL Place 4 Pump onto the skin daily. Apply 4 pumps to each shoulder every morning     traZODone (DESYREL) 50 MG tablet Take 1 tablet (50 mg total) by mouth at bedtime. 30 tablet 0   Turmeric 500 MG CAPS Take 500 mg by mouth daily.     vitamin E 400 UNIT capsule Take 400 Units by mouth at bedtime.      Current Facility-Administered Medications  Medication Dose Route Frequency Provider  Last Rate Last Admin   0.9 %  sodium chloride infusion  500 mL Intravenous Once Thornton Park, MD       0.9 %  sodium chloride infusion  500 mL Intravenous Once Armbruster, Carlota Raspberry, MD        Allergies:   Dog epithelium allergy skin test and Mushroom extract complex   Social History:  The patient  reports that he quit smoking about 34 years ago. His smoking use included cigarettes. He has never used smokeless tobacco. He reports current alcohol use of about 1.0 standard drink per week. He reports that he does not use drugs.   Family History:  The patient's family history includes Arthritis in his father and mother; Diabetes in his father and mother; Heart disease (age of onset: 69) in  his father; Hyperlipidemia in his father and mother; Hypertension in his father; Kidney disease in his paternal grandfather; Stroke (age of onset: 29) in his father.   ROS:  Please see the history of present illness.   Otherwise, review of systems is positive for none.   All other systems are reviewed and negative.   PHYSICAL EXAM: VS:  BP 120/82    Pulse (!) 114    Ht 5' 6.5" (1.689 m)    Wt 148 lb 12.8 oz (67.5 kg)    SpO2 96%    BMI 23.66 kg/m  , BMI Body mass index is 23.66 kg/m. GEN: Well nourished, well developed, in no acute distress  HEENT: normal  Neck: no JVD, carotid bruits, or masses Cardiac: Irregular, tachycardic; no murmurs, rubs, or gallops,no edema  Respiratory:  clear to auscultation bilaterally, normal work of breathing GI: soft, nontender, nondistended, + BS MS: no deformity or atrophy  Skin: warm and dry Neuro:  Strength and sensation are intact Psych: euthymic mood, full affect  EKG:  EKG is ordered today. Personal review of the ekg ordered shows atrial fibrillation  Recent Labs: 07/27/2020: ALT 25 04/27/2021: Platelets 246; TSH 2.760 05/21/2021: BUN 19; Creatinine, Ser 1.60; Hemoglobin 13.6; Potassium 4.9; Sodium 141    Lipid Panel     Component Value Date/Time   CHOL 73  (L) 07/27/2020 0954   TRIG 59 07/27/2020 0954   HDL 40 07/27/2020 0954   CHOLHDL 1.8 07/27/2020 0954   CHOLHDL 2.8 08/22/2018 0645   VLDL 12 08/22/2018 0645   LDLCALC 19 07/27/2020 0954     Wt Readings from Last 3 Encounters:  06/26/21 148 lb 12.8 oz (67.5 kg)  05/30/21 148 lb 12.8 oz (67.5 kg)  05/21/21 152 lb 8.9 oz (69.2 kg)      Other studies Reviewed: Additional studies/ records that were reviewed today include: TTE 08/22/18  Review of the above records today demonstrates:   1. The left ventricle has normal systolic function of 66-44%. The cavity size is normal. There is mild concentric left ventricular hypertrophy. Echo evidence of normal diastolic filling patterns. Normal left ventricular filling pressures.  2. Normal left atrial size.  3. Normal right atrial size.  4. The mitral valve normal in structure. There is mild mitral annular calcification present. Regurgitation is trivial by color flow Doppler . No evidence of mitral valve stenosis.  5. Normal tricuspid valve.  6. The aortic valve possibly bicuspid. Aortic valve regurgitation is mild to moderate by color flow Doppler. The jet is eccentric posteriorly directed.  7. Moderate dilatation of the aortic root.  8. No atrial level shunt detected by color flow Doppler.  9. Consider further evaluation of the ascending aorta with CT angiography.  LHC 08/24/18  Ost LAD lesion is 100% stenosed. Ost RCA to Prox RCA lesion is 100% stenosed. Origin lesion is 100% stenosed. Prox Cx lesion is 50% stenosed.  Monitor 08/04/18 personally reviewed Sinus bradycardia, normal sinus rhythm and sinus tachycardia. The average heart rate was 67bpm. The heart rate ranged from 43 to 135bpm. Frequent PVCs single, bigeminal and trigeminal and nonsustained ventricular tachycardia up to 4 beats. Paroxysmal atrial flutter < 1% burden.  ASSESSMENT AND PLAN:  1.  Typical atrial flutter: CHA2DS2-VASc of 3.  Status post ablation 09/29/2019.  Amiodarone  and Eliquis were stopped at the last visit.  He has had no further episodes.  2.  Coronary artery disease: No current angina  3.  Hypertension: Currently well controlled  4.  Obstructive sleep apnea: CPAP compliance encouraged  5.  Persistent atrial fibrillation: Currently on Eliquis 5 mg twice daily.  His atrial fibrillation is rapid.  He is also on nebivolol 20 mg daily.  I do think he would benefit from rhythm control.  He would like to avoid long-term medications.  Due to that, we Guthrie Lemme plan for ablation.  He is feeling quite poorly now.  We Suellyn Meenan load him on amiodarone and plan for cardioversion prior to ablation.  Risk, benefits, and alternatives to EP study and radiofrequency ablation for afib were also discussed in detail today. These risks include but are not limited to stroke, bleeding, vascular damage, tamponade, perforation, damage to the esophagus, lungs, and other structures, pulmonary vein stenosis, worsening renal function, and death. The patient understands these risk and wishes to proceed.  We Jyron Turman therefore proceed with catheter ablation at the next available time.  Carto, ICE, anesthesia are requested for the procedure.  Freedom Peddy also obtain CT PV protocol prior to the procedure to exclude LAA thrombus and further evaluate atrial anatomy.   Case discussed with primary cardiology  Current medicines are reviewed at length with the patient today.   The patient does not have concerns regarding his medicines.  The following changes were made today: Amiodarone  Labs/ tests ordered today include:  Orders Placed This Encounter  Procedures   EKG 12-Lead      Disposition:   FU with Aowyn Rozeboom 3 months  Signed, Eisley Barber Meredith Leeds, MD  06/27/2021 8:17 AM     [address] [place] [address] [phone] (office) [phone] (fax)

## 2021-06-26 NOTE — Progress Notes (Signed)
Electrophysiology Office Note   Date:  06/27/2021   ID:  [name], DOB 1946-03-03, MRN [id number]  PCP:  Burman Freestone, MD  Cardiologist: Radford Pax Primary Electrophysiologist:  Peggy Loge Meredith Leeds, MD    Chief Complaint: Atrial flutter   History of Present Illness: [name] is a 75 y.o. male who is being seen today for the evaluation of atrial flutter at the request of Burman Freestone, MD. Presenting today for electrophysiology evaluation.  He has a history significant coronary artery disease status post CABG 1998, bicuspid aortic valve with moderate to severe AI, dilated aortic root, hypertension, hyperlipidemia, OSA, atrial flutter.  He was having increasing palpitations.  30-day monitor showed episodes of atrial flutter with heart rates in the 120s.  He is now status post atrial flutter ablation on 09/29/2019.  Unfortunately, he came back to cardiology clinic in atrial fibrillation.  He had a cardioversion 05/21/2021 but unfortunately went back into atrial fibrillation on 05/27/2021.  He cannot identify any specific triggers.  Today, denies symptoms of palpitations, chest pain, orthopnea, PND, lower extremity edema, claudication, dizziness, presyncope, syncope, bleeding, or neurologic sequela. The patient is tolerating medications without difficulties.  Unfortunately, when he went into atrial fibrillation he has symptoms of fatigue and shortness of breath.  After his atrial flutter ablation he was doing quite well up until this past October.  He went into atrial fibrillation.  He has been feeling quite poorly since going into atrial fibrillation.   Past Medical History:  Diagnosis Date   Anal fissure    Aortic insufficiency    Arthritis    Atrial flutter (HCC)    Bicuspid aortic valve    with mild to moderate AR by echo 08/2019   BPH (benign prostatic hyperplasia)    CAD (coronary artery disease)    s/p CABG [employer] [place]   Cataract    bilateral-removed   CKD  (chronic kidney disease), stage III ([place])    Depression    Dilated aortic root (HCC)    History of blood transfusion 1998   "due to cardiac bypass surgery"   Hyperlipidemia    Hypertension    Hypogonadism in male    Kidney stones    Migraine    Mild carotid artery disease ([place]) 2009   Myocardial infarction ([place])    PVC's (premature ventricular contractions)    Retinal hemorrhage 11/04/2014   Sleep apnea with use of continuous positive airway pressure (CPAP)    Thoracic aortic aneurysm (TAA)    Past Surgical History:  Procedure Laterality Date   A-FLUTTER ABLATION N/A 09/29/2019   Procedure: A-FLUTTER ABLATION;  Surgeon: Constance Haw, MD;  Location: [place] [facility];  Service: Cardiovascular;  Laterality: N/A;   CARDIOVERSION N/A 08/10/2019   Procedure: CARDIOVERSION;  Surgeon: Elouise Munroe, MD;  Location: [facility] ENDOSCOPY;  Service: Cardiovascular;  Laterality: N/A;   CARDIOVERSION N/A 05/21/2021   Procedure: CARDIOVERSION;  Surgeon: Josue Hector, MD;  Location: [facility] ENDOSCOPY;  Service: Cardiovascular;  Laterality: N/A;   cataracts     COLONOSCOPY     CORONARY ARTERY BYPASS GRAFT  1998   LEFT HEART CATH AND CORONARY ANGIOGRAPHY N/A 08/24/2018   Procedure: LEFT HEART CATH AND CORONARY ANGIOGRAPHY;  Surgeon: Lorretta Harp, MD;  Location: [place] [facility];  Service: Cardiovascular;  Laterality: N/A;   TEE WITHOUT CARDIOVERSION N/A 01/19/2015   Procedure: TRANSESOPHAGEAL ECHOCARDIOGRAM (TEE);  Surgeon: Sueanne Margarita, MD;  Location: [place];  Service: Cardiovascular;  Laterality: N/A;   [place]   pt denies     Current Outpatient Medications  Medication Sig Dispense Refill   acetaminophen (TYLENOL) 325 MG tablet Take 650 mg by mouth every 6 (six) hours as needed for mild pain or moderate pain.     ALPRAZOLAM XR 1 MG 24 hr tablet TAKE ONE TABLET BY MOUTH DAILY (Patient taking differently: Take 1 mg by mouth every evening.) 90 tablet 1    amiodarone (PACERONE) 200 MG tablet Take 2 tablets (400 mg total) TWICE daily for two weeks, then reduce and take 1 tablet (200 mg total) ONCE a day 60 tablet 3   amLODipine (NORVASC) 10 MG tablet Take 1 tablet (10 mg total) by mouth daily. 90 tablet 3   apixaban (ELIQUIS) 5 MG TABS tablet Take 1 tablet (5 mg total) by mouth 2 (two) times daily. 60 tablet 11   Ascorbic Acid (VITAMIN C) 1000 MG tablet Take 1,000 mg by mouth 2 (two) times daily.     aspirin-acetaminophen-caffeine (EXCEDRIN MIGRAINE) 250-250-65 MG tablet Take 2 tablets by mouth daily as needed for headache.      butalbital-aspirin-caffeine-codeine (FIORINAL WITH CODEINE) 50-325-40-30 MG capsule Prn use for break through migraine 30 capsule 5   diazepam (VALIUM) 5 MG tablet Take 2.5-5 mg by mouth 2 (two) times daily as needed for anxiety.      ibuprofen (ADVIL,MOTRIN) 200 MG tablet Take 200-400 mg by mouth as needed (for arthritis in the feet).     losartan (COZAAR) 25 MG tablet Take 1 tablet (25 mg total) by mouth daily. 90 tablet 3   Nebivolol HCl (BYSTOLIC) 20 MG TABS Take 1 tablet (20 mg total) by mouth daily. 90 tablet 3   niacin 500 MG tablet Take 500 mg by mouth at bedtime.     rosuvastatin (CRESTOR) 20 MG tablet Take 1 tablet (20 mg total) by mouth daily. (Patient taking differently: Take 20 mg by mouth every evening.) 90 tablet 3   Specialty Vitamins Products (CENTRUM SPECIALIST ENERGY) TABS Take 1 tablet by mouth daily with breakfast.     tadalafil (CIALIS) 5 MG tablet Take 5 mg by mouth every evening.     Testosterone 40.5 MG/2.5GM (1.62%) GEL Place 4 Pump onto the skin daily. Apply 4 pumps to each shoulder every morning     traZODone (DESYREL) 50 MG tablet Take 1 tablet (50 mg total) by mouth at bedtime. 30 tablet 0   Turmeric 500 MG CAPS Take 500 mg by mouth daily.     vitamin E 400 UNIT capsule Take 400 Units by mouth at bedtime.      Current Facility-Administered Medications  Medication Dose Route Frequency Provider  Last Rate Last Admin   0.9 %  sodium chloride infusion  500 mL Intravenous Once Thornton Park, MD       0.9 %  sodium chloride infusion  500 mL Intravenous Once Armbruster, Carlota Raspberry, MD        Allergies:   Dog epithelium allergy skin test and Mushroom extract complex   Social History:  The patient  reports that he quit smoking about 34 years ago. His smoking use included cigarettes. He has never used smokeless tobacco. He reports current alcohol use of about 1.0 standard drink per week. He reports that he does not use drugs.   Family History:  The patient's family history includes Arthritis in his father and mother; Diabetes in his father and mother; Heart disease (age of onset: 84) in  his father; Hyperlipidemia in his father and mother; Hypertension in his father; Kidney disease in his paternal grandfather; Stroke (age of onset: 72) in his father.   ROS:  Please see the history of present illness.   Otherwise, review of systems is positive for none.   All other systems are reviewed and negative.   PHYSICAL EXAM: VS:  BP 120/82   Pulse (!) 114   Ht 5' 6.5" (1.689 m)   Wt 148 lb 12.8 oz (67.5 kg)   SpO2 96%   BMI 23.66 kg/m  , BMI Body mass index is 23.66 kg/m. GEN: Well nourished, well developed, in no acute distress  HEENT: normal  Neck: no JVD, carotid bruits, or masses Cardiac: Irregular, tachycardic; no murmurs, rubs, or gallops,no edema  Respiratory:  clear to auscultation bilaterally, normal work of breathing GI: soft, nontender, nondistended, + BS MS: no deformity or atrophy  Skin: warm and dry Neuro:  Strength and sensation are intact Psych: euthymic mood, full affect  EKG:  EKG is ordered today. Personal review of the ekg ordered shows atrial fibrillation  Recent Labs: 07/27/2020: ALT 25 04/27/2021: Platelets 246; TSH 2.760 05/21/2021: BUN 19; Creatinine, Ser 1.60; Hemoglobin 13.6; Potassium 4.9; Sodium 141    Lipid Panel     Component Value Date/Time   CHOL 73  (L) 07/27/2020 0954   TRIG 59 07/27/2020 0954   HDL 40 07/27/2020 0954   CHOLHDL 1.8 07/27/2020 0954   CHOLHDL 2.8 08/22/2018 0645   VLDL 12 08/22/2018 0645   LDLCALC 19 07/27/2020 0954     Wt Readings from Last 3 Encounters:  06/26/21 148 lb 12.8 oz (67.5 kg)  05/30/21 148 lb 12.8 oz (67.5 kg)  05/21/21 152 lb 8.9 oz (69.2 kg)      Other studies Reviewed: Additional studies/ records that were reviewed today include: TTE 08/22/18  Review of the above records today demonstrates:   1. The left ventricle has normal systolic function of 76-22%. The cavity size is normal. There is mild concentric left ventricular hypertrophy. Echo evidence of normal diastolic filling patterns. Normal left ventricular filling pressures.  2. Normal left atrial size.  3. Normal right atrial size.  4. The mitral valve normal in structure. There is mild mitral annular calcification present. Regurgitation is trivial by color flow Doppler . No evidence of mitral valve stenosis.  5. Normal tricuspid valve.  6. The aortic valve possibly bicuspid. Aortic valve regurgitation is mild to moderate by color flow Doppler. The jet is eccentric posteriorly directed.  7. Moderate dilatation of the aortic root.  8. No atrial level shunt detected by color flow Doppler.  9. Consider further evaluation of the ascending aorta with CT angiography.  LHC 08/24/18  Ost LAD lesion is 100% stenosed. Ost RCA to Prox RCA lesion is 100% stenosed. Origin lesion is 100% stenosed. Prox Cx lesion is 50% stenosed.  Monitor 08/04/18 personally reviewed Sinus bradycardia, normal sinus rhythm and sinus tachycardia. The average heart rate was 67bpm. The heart rate ranged from 43 to 135bpm. Frequent PVCs single, bigeminal and trigeminal and nonsustained ventricular tachycardia up to 4 beats. Paroxysmal atrial flutter < 1% burden.  ASSESSMENT AND PLAN:  1.  Typical atrial flutter: CHA2DS2-VASc of 3.  Status post ablation 09/29/2019.  Amiodarone  and Eliquis were stopped at the last visit.  He has had no further episodes.  2.  Coronary artery disease: No current angina  3.  Hypertension: Currently well controlled  4.  Obstructive sleep apnea: CPAP compliance  encouraged  5.  Persistent atrial fibrillation: Currently on Eliquis 5 mg twice daily.  His atrial fibrillation is rapid.  He is also on nebivolol 20 mg daily.  I do think he would benefit from rhythm control.  He would like to avoid long-term medications.  Due to that, we Melanie Openshaw plan for ablation.  He is feeling quite poorly now.  We Taneshia Lorence load him on amiodarone and plan for cardioversion prior to ablation.  Risk, benefits, and alternatives to EP study and radiofrequency ablation for afib were also discussed in detail today. These risks include but are not limited to stroke, bleeding, vascular damage, tamponade, perforation, damage to the esophagus, lungs, and other structures, pulmonary vein stenosis, worsening renal function, and death. The patient understands these risk and wishes to proceed.  We Marquasha Brutus therefore proceed with catheter ablation at the next available time.  Carto, ICE, anesthesia are requested for the procedure.  Qasim Diveley also obtain CT PV protocol prior to the procedure to exclude LAA thrombus and further evaluate atrial anatomy.   Case discussed with primary cardiology  Current medicines are reviewed at length with the patient today.   The patient does not have concerns regarding his medicines.  The following changes were made today: Amiodarone  Labs/ tests ordered today include:  Orders Placed This Encounter  Procedures   EKG 12-Lead      Disposition:   FU with Lanecia Sliva 3 months  Signed, Zay Yeargan Meredith Leeds, MD  06/27/2021 8:17 AM     [place] [address] [place] [address] [phone] (office) [phone] (fax)

## 2021-06-26 NOTE — Patient Instructions (Signed)
Medication Instructions:  Your physician has recommended you make the following change in your medication:  START Amiodarone - take 2 tablets (400 mg total) TWICE daily for 2 weeks, then reduce and  - take 1 tablet (200 mg total) ONCE daily  *If you need a refill on your cardiac medications before your next appointment, please call your pharmacy*   Lab Work: Pre procedure lab work:  ______________ If you have labs (blood work) drawn today and your tests are completely normal, you will receive your results only by: [employer] (if you have MyChart) OR A paper copy in the mail If you have any lab test that is abnormal or we need to change your treatment, we will call you to review the results.   Testing/Procedures: Your physician has recommended that you have a Cardioversion (DCCV). Electrical Cardioversion uses a jolt of electricity to your heart either through paddles or wired patches attached to your chest. This is a controlled, usually prescheduled, procedure. Defibrillation is done under light anesthesia in the hospital, and you usually go home the day of the procedure. This is done to get your heart back into a normal rhythm. You are not awake for the procedure.    The nurse will send instructions via mychart   Your physician has requested that you have cardiac CT. Cardiac computed tomography (CT) is a painless test that uses an x-ray machine to take clear, detailed pictures of your heart.  The hospital will call to arrange this testing prior to ablation.   Your physician has recommended that you have an ablation. Catheter ablation is a medical procedure used to treat some cardiac arrhythmias (irregular heartbeats). During catheter ablation, a long, thin, flexible tube is put into a blood vessel in your groin (upper thigh), or neck. This tube is called an ablation catheter. It is then guided to your heart through the blood vessel. Radio frequency waves destroy small areas of heart  tissue where abnormal heartbeats may cause an arrhythmia to start.   The nurse will send instructions via mychart     Follow-Up: At [facility], you and your health needs are our priority.  As part of our continuing mission to provide you with exceptional heart care, we have created designated Provider Care Teams.  These Care Teams include your primary Cardiologist (physician) and Advanced Practice Providers (APPs -  Physician Assistants and Nurse Practitioners) who all work together to provide you with the care you need, when you need it.  Your next appointment:   3 month(s) after ablation  The format for your next appointment:   In Person  Provider:   Allegra Lai, MD    Thank you for choosing [facility]!!   Trinidad Curet, RN [phone]   Other Instructions  Amiodarone Tablets What is this medication? AMIODARONE (a MEE oh da rone) prevents and treats a fast or irregular heartbeat (arrhythmia). It works by slowing down overactive electric signals in the heart, which stabilizes your heart rhythm. It belongs to a group of medications called antiarrhythmics. This medicine may be used for other purposes; ask your health care provider or pharmacist if you have questions. COMMON BRAND NAME(S): Cordarone, Pacerone What should I tell my care team before I take this medication? They need to know if you have any of these conditions: Liver disease Lung disease Other heart problems Thyroid disease An unusual or allergic reaction to amiodarone, iodine, other medications, foods, dyes, or preservatives Pregnant or trying to get pregnant Breast-feeding How  should I use this medication? Take this medication by mouth with a glass of water. Follow the directions on the prescription label. You can take this medication with or without food. However, you should always take it the same way each time. Take your doses at regular intervals. Do not take your medication more often than  directed. Do not stop taking except on the advice of your care team. A special MedGuide will be given to you by the pharmacist with each prescription and refill. Be sure to read this information carefully each time. Talk to your care team regarding the use of this medication in children. Special care may be needed. Overdosage: If you think you have taken too much of this medicine contact a poison control center or emergency room at once. NOTE: This medicine is only for you. Do not share this medicine with others. What if I miss a dose? If you miss a dose, take it as soon as you can. If it is almost time for your next dose, take only that dose. Do not take double or extra doses. What may interact with this medication? Do not take this medication with any of the following: Abarelix Apomorphine Arsenic trioxide Certain antibiotics like erythromycin, gemifloxacin, levofloxacin, pentamidine Certain medications for depression like amoxapine, tricyclic antidepressants Certain medications for fungal infections like fluconazole, itraconazole, ketoconazole, posaconazole, voriconazole Certain medications for irregular heartbeat like disopyramide, dronedarone, ibutilide, propafenone, sotalol Certain medications for malaria like chloroquine, halofantrine Cisapride Droperidol Haloperidol [place] Maprotiline Methadone Phenothiazines like chlorpromazine, mesoridazine, thioridazine Pimozide Ranolazine Red yeast rice Vardenafil This medication may also interact with the following: Antiviral medications for HIV or AIDS Certain medications for blood pressure, heart disease, irregular heart beat Certain medications for cholesterol like atorvastatin, cerivastatin, lovastatin, simvastatin Certain medications for hepatitis C like sofosbuvir and ledipasvir; sofosbuvir Certain medications for seizures like phenytoin Certain medications for thyroid problems Certain medications that treat or prevent blood  clots like warfarin Cholestyramine Cimetidine Clopidogrel Cyclosporine Dextromethorphan Diuretics Dofetilide Fentanyl General anesthetics Grapefruit juice Lidocaine Loratadine Methotrexate Other medications that prolong the QT interval (cause an abnormal heart rhythm) Procainamide Quinidine Rifabutin, rifampin, or rifapentine [place]. Chadric's Wort Trazodone Ziprasidone This list may not describe all possible interactions. Give your health care provider a list of all the medicines, herbs, non-prescription drugs, or dietary supplements you use. Also tell them if you smoke, drink alcohol, or use illegal drugs. Some items may interact with your medicine. What should I watch for while using this medication? Your condition will be monitored closely when you first begin therapy. Often, this medication is first started in a hospital or other monitored health care setting. Once you are on maintenance therapy, visit your care team for regular checks on your progress. Because your condition and use of this medication carry some risk, it is a good idea to carry an identification card, necklace or bracelet with details of your condition, medications, and care team. You may get drowsy or dizzy. Do not drive, use machinery, or do anything that needs mental alertness until you know how this medication affects you. Do not stand or sit up quickly, especially if you are an older patient. This reduces the risk of dizzy or fainting spells. This medication can make you more sensitive to the sun. Keep out of the sun. If you cannot avoid being in the sun, wear protective clothing and use sunscreen. Do not use sun lamps or tanning beds/booths. You should have regular eye exams before and during treatment. Call your care  team if you have blurred vision, see halos, or your eyes become sensitive to light. Your eyes may get dry. It may be helpful to use a lubricating eye solution or artificial tears solution. If you are  going to have surgery or a procedure that requires contrast dyes, tell your care team that you are taking this medication. What side effects may I notice from receiving this medication? Side effects that you should report to your care team as soon as possible: Allergic reactions--skin rash, itching, hives, swelling of the face, lips, tongue, or throat Bluish-gray skin Change in vision such as blurry vision, seeing halos around lights, vision loss Heart failure--shortness of breath, swelling of the ankles, feet, or hands, sudden weight gain, unusual weakness or fatigue Heart rhythm changes--fast or irregular heartbeat, dizziness, feeling faint or lightheaded, chest pain, trouble breathing High thyroid levels (hyperthyroidism)--fast or irregular heartbeat, weight loss, excessive sweating or sensitivity to heat, tremors or shaking, anxiety, nervousness, irregular menstrual cycle or spotting Liver injury--right upper belly pain, loss of appetite, nausea, light-colored stool, dark yellow or brown urine, yellowing skin or eyes, unusual weakness or fatigue Low thyroid levels (hypothyroidism)--unusual weakness or fatigue, sensitivity to cold, constipation, hair loss, dry skin, weight gain, feelings of depression Lung injury--shortness of breath or trouble breathing, cough, spitting up blood, chest pain, fever Pain, tingling, or numbness in the hands or feet, muscle weakness, trouble walking, loss of balance or coordination Side effects that usually do not require medical attention (report to your care team if they continue or are bothersome): Nausea Vomiting This list may not describe all possible side effects. Call your doctor for medical advice about side effects. You may report side effects to FDA at 1-800-FDA-1088. Where should I keep my medication? Keep out of the reach of children and pets. Store at room temperature between 20 and 25 degrees C (68 and 77 degrees F). Protect from light. Keep  container tightly closed. Throw away any unused medication after the expiration date. NOTE: This sheet is a summary. It may not cover all possible information. If you have questions about this medicine, talk to your doctor, pharmacist, or health care provider.  2022 Elsevier/Gold Standard (2020-09-01 00:00:00)   Cardiac Ablation Cardiac ablation is a procedure to destroy (ablate) some heart tissue that is sending bad signals. These bad signals cause problems in heart rhythm. The heart has many areas that make these signals. If there are problems in these areas, they can make the heart beat in a way that is not normal. Destroying some tissues can help make the heart rhythm normal. Tell your doctor about: Any allergies you have. All medicines you are taking. These include vitamins, herbs, eye drops, creams, and over-the-counter medicines. Any problems you or family members have had with medicines that make you fall asleep (anesthetics). Any blood disorders you have. Any surgeries you have had. Any medical conditions you have, such as kidney failure. Whether you are pregnant or may be pregnant. What are the risks? This is a safe procedure. But problems may occur, including: Infection. Bruising and bleeding. Bleeding into the chest. Stroke or blood clots. Damage to nearby areas of your body. Allergies to medicines or dyes. The need for a pacemaker if the normal system is damaged. Failure of the procedure to treat the problem. What happens before the procedure? Medicines Ask your doctor about: Changing or stopping your normal medicines. This is important. Taking aspirin and ibuprofen. Do not take these medicines unless your doctor tells you to  take them. Taking other medicines, vitamins, herbs, and supplements. General instructions Follow instructions from your doctor about what you cannot eat or drink. Plan to have someone take you home from the hospital or clinic. If you will be  going home right after the procedure, plan to have someone with you for 24 hours. Ask your doctor what steps will be taken to prevent infection. What happens during the procedure?  An IV tube will be put into one of your veins. You will be given a medicine to help you relax. The skin on your neck or groin will be numbed. A cut (incision) will be made in your neck or groin. A needle will be put through your cut and into a large vein. A tube (catheter) will be put into the needle. The tube will be moved to your heart. Dye may be put through the tube. This helps your doctor see your heart. Small devices (electrodes) on the tube will send out signals. A type of energy will be used to destroy some heart tissue. The tube will be taken out. Pressure will be held on your cut. This helps stop bleeding. A bandage will be put over your cut. The exact procedure may vary among doctors and hospitals. What happens after the procedure? You will be watched until you leave the hospital or clinic. This includes checking your heart rate, breathing rate, oxygen, and blood pressure. Your cut will be watched for bleeding. You will need to lie still for a few hours. Do not drive for 24 hours or as long as your doctor tells you. Summary Cardiac ablation is a procedure to destroy some heart tissue. This is done to treat heart rhythm problems. Tell your doctor about any medical conditions you may have. Tell him or her about all medicines you are taking to treat them. This is a safe procedure. But problems may occur. These include infection, bruising, bleeding, and damage to nearby areas of your body. Follow what your doctor tells you about food and drink. You may also be told to change or stop some of your medicines. After the procedure, do not drive for 24 hours or as long as your doctor tells you. This information is not intended to replace advice given to you by your health care provider. Make sure you discuss any  questions you have with your health care provider. Document Revised: 06/10/2019 Document Reviewed: 06/10/2019 Elsevier Patient Education  2022 [employer].

## 2021-06-27 ENCOUNTER — Encounter: Payer: Self-pay | Admitting: Cardiology

## 2021-06-27 DIAGNOSIS — I4819 Other persistent atrial fibrillation: Secondary | ICD-10-CM

## 2021-07-02 ENCOUNTER — Encounter (HOSPITAL_COMMUNITY): Payer: Self-pay | Admitting: Cardiology

## 2021-07-04 ENCOUNTER — Ambulatory Visit: Payer: Medicare Other | Admitting: Adult Health

## 2021-07-09 ENCOUNTER — Ambulatory Visit (INDEPENDENT_AMBULATORY_CARE_PROVIDER_SITE_OTHER): Payer: Medicare Other | Admitting: Adult Health

## 2021-07-09 ENCOUNTER — Encounter: Payer: Self-pay | Admitting: Adult Health

## 2021-07-09 VITALS — BP 118/64 | HR 60 | Ht 67.0 in | Wt 157.4 lb

## 2021-07-09 DIAGNOSIS — F5104 Psychophysiologic insomnia: Secondary | ICD-10-CM | POA: Diagnosis not present

## 2021-07-09 DIAGNOSIS — Z9989 Dependence on other enabling machines and devices: Secondary | ICD-10-CM

## 2021-07-09 DIAGNOSIS — G4733 Obstructive sleep apnea (adult) (pediatric): Secondary | ICD-10-CM

## 2021-07-09 DIAGNOSIS — G43109 Migraine with aura, not intractable, without status migrainosus: Secondary | ICD-10-CM

## 2021-07-09 DIAGNOSIS — I2510 Atherosclerotic heart disease of native coronary artery without angina pectoris: Secondary | ICD-10-CM

## 2021-07-09 MED ORDER — TRAZODONE HCL 50 MG PO TABS: 75.0000 mg | ORAL_TABLET | Freq: Every day | ORAL | 11 refills | Status: DC

## 2021-07-09 NOTE — Progress Notes (Signed)
PATIENT: [name] DOB: 1945-08-18  REASON FOR VISIT: follow up HISTORY FROM: patient PRIMARY NEUROLOGIST: Dr. Brett Fairy  HISTORY OF PRESENT ILLNESS: Today 07/09/21:  [name] is a 75 year old male with a history of migraines, insomnia, obstructive sleep apnea on CPAP.  He returns today for follow-up.  Migraines: Controlled. Using Fioricet with codeine  6-8 times a month.   Insomnia: xanax nightly, trazodone 50 mg helping but feels like an increased dose would be helpful.   OSA on CPAP: Download below. Sometimes mask will leak but he adjust it.   Patient has A.fib. has several tests coming up: cardioversion scheduled for 12/22, Cardiac scan on 1/4 and ablation 1/12 and Echo 2/7.    REVIEW OF SYSTEMS: Out of a complete 14 system review of symptoms, the patient complains only of the following symptoms, and all other reviewed systems are negative.   ALLERGIES: Allergies  Allergen Reactions   Dog Epithelium Allergy Skin Test Itching, Anxiety, Palpitations and Other (See Comments)    Other reaction(s): Respiratory Distress (ALLERGY/intolerance), Wheezing (ALLERGY/intolerance)   Mushroom Extract Complex Nausea Only and Other (See Comments)    Severe vertigo and headaches    HOME MEDICATIONS: Outpatient Medications Prior to Visit  Medication Sig Dispense Refill   acetaminophen (TYLENOL) 325 MG tablet Take 650 mg by mouth every 6 (six) hours as needed for mild pain or moderate pain.     ALPRAZOLAM XR 1 MG 24 hr tablet TAKE ONE TABLET BY MOUTH DAILY (Patient taking differently: Take 1 mg by mouth every evening.) 90 tablet 1   amiodarone (PACERONE) 200 MG tablet Take 2 tablets (400 mg total) TWICE daily for two weeks, then reduce and take 1 tablet (200 mg total) ONCE a day (Patient taking differently: Take 200 mg by mouth daily.) 60 tablet 3   amLODipine (NORVASC) 10 MG tablet Take 1 tablet (10 mg total) by mouth daily. 90 tablet 3   apixaban (ELIQUIS) 5 MG TABS tablet Take  1 tablet (5 mg total) by mouth 2 (two) times daily. 60 tablet 11   Ascorbic Acid (VITAMIN C) 1000 MG tablet Take 1,000 mg by mouth 2 (two) times daily.     aspirin-acetaminophen-caffeine (EXCEDRIN MIGRAINE) 250-250-65 MG tablet Take 2 tablets by mouth daily as needed for headache.      butalbital-aspirin-caffeine-codeine ([place]) 50-325-40-30 MG capsule Prn use for break through migraine (Patient taking differently: Take 1 capsule by mouth every 8 (eight) hours as needed for migraine. Prn use for break through migraine) 30 capsule 5   diazepam (VALIUM) 5 MG tablet Take 2.5-5 mg by mouth 2 (two) times daily as needed for anxiety.      ibuprofen (ADVIL,MOTRIN) 200 MG tablet Take 200-400 mg by mouth every 8 (eight) hours as needed (for arthritis in the feet).     losartan (COZAAR) 25 MG tablet Take 1 tablet (25 mg total) by mouth daily. 90 tablet 3   Nebivolol HCl (BYSTOLIC) 20 MG TABS Take 1 tablet (20 mg total) by mouth daily. 90 tablet 3   niacin 500 MG tablet Take 500 mg by mouth at bedtime.     rosuvastatin (CRESTOR) 20 MG tablet Take 1 tablet (20 mg total) by mouth daily. (Patient taking differently: Take 20 mg by mouth every evening.) 90 tablet 3   Specialty Vitamins Products (CENTRUM SPECIALIST ENERGY) TABS Take 1 tablet by mouth daily with breakfast.     tadalafil (CIALIS) 5 MG tablet Take 5 mg by mouth every evening.  Testosterone 40.5 MG/2.5GM (1.62%) GEL Place 4 Pump onto the skin daily. Apply 4 pumps to each shoulder every morning     traZODone (DESYREL) 50 MG tablet Take 1 tablet (50 mg total) by mouth at bedtime. 30 tablet 0   Turmeric 500 MG CAPS Take 500 mg by mouth daily.     vitamin E 400 UNIT capsule Take 400 Units by mouth at bedtime.      Facility-Administered Medications Prior to Visit  Medication Dose Route Frequency Provider Last Rate Last Admin   0.9 %  sodium chloride infusion  500 mL Intravenous Once [place], MD       0.9 %  sodium chloride  infusion  500 mL Intravenous Once Armbruster, Carlota Raspberry, MD        PAST MEDICAL HISTORY: Past Medical History:  Diagnosis Date   Anal fissure    Aortic insufficiency    Arthritis    Atrial flutter (HCC)    Bicuspid aortic valve    with mild to moderate AR by echo 08/2019   BPH (benign prostatic hyperplasia)    CAD (coronary artery disease)    s/p CABG [place]   Cataract    bilateral-removed   CKD (chronic kidney disease), stage III ([place])    Depression    Dilated aortic root (HCC)    History of blood transfusion 1998   "due to cardiac bypass surgery"   Hyperlipidemia    Hypertension    Hypogonadism in male    Kidney stones    Migraine    Mild carotid artery disease ([place]) 2009   Myocardial infarction ([place])    PVC's (premature ventricular contractions)    Retinal hemorrhage 11/04/2014   Sleep apnea with use of continuous positive airway pressure (CPAP)    Thoracic aortic aneurysm (TAA)     PAST SURGICAL HISTORY: Past Surgical History:  Procedure Laterality Date   A-FLUTTER ABLATION N/A 09/29/2019   Procedure: A-FLUTTER ABLATION;  Surgeon: Constance Haw, MD;  Location: [place];  Service: Cardiovascular;  Laterality: N/A;   CARDIOVERSION N/A 08/10/2019   Procedure: CARDIOVERSION;  Surgeon: Elouise Munroe, MD;  Location: [facility] ENDOSCOPY;  Service: Cardiovascular;  Laterality: N/A;   CARDIOVERSION N/A 05/21/2021   Procedure: CARDIOVERSION;  Surgeon: Josue Hector, MD;  Location: [facility] ENDOSCOPY;  Service: Cardiovascular;  Laterality: N/A;   cataracts     COLONOSCOPY     CORONARY ARTERY BYPASS GRAFT  1998   LEFT HEART CATH AND CORONARY ANGIOGRAPHY N/A 08/24/2018   Procedure: LEFT HEART CATH AND CORONARY ANGIOGRAPHY;  Surgeon: Lorretta Harp, MD;  Location: [place] [facility];  Service: Cardiovascular;  Laterality: N/A;   TEE WITHOUT CARDIOVERSION N/A 01/19/2015   Procedure: TRANSESOPHAGEAL ECHOCARDIOGRAM (TEE);  Surgeon: Sueanne Margarita, MD;   Location: [facility] ENDOSCOPY;  Service: Cardiovascular;  Laterality: N/A;   [place]   pt denies    FAMILY HISTORY: Family History  Problem Relation Age of Onset   Arthritis Mother        deceased   Hyperlipidemia Mother    Diabetes Mother    Arthritis Father    Hyperlipidemia Father    Heart disease Father 52   Stroke Father 26       deceased   Hypertension Father    Diabetes Father    Kidney disease Paternal Grandfather    Colon cancer Neg Hx    Colon polyps Neg Hx    Esophageal cancer Neg Hx  Stomach cancer Neg Hx    Rectal cancer Neg Hx    Sleep apnea Neg Hx     SOCIAL HISTORY: Social History   Socioeconomic History   Marital status: Divorced    Spouse name: Not on file   Number of children: 0   Years of education: Not on file   Highest education level: Not on file  Occupational History   Occupation: retired  Tobacco Use   Smoking status: Former    Years: 20.00    Types: Cigarettes    Quit date: 07/22/1986    Years since quitting: 34.9   Smokeless tobacco: Never   Tobacco comments:    Former smoker 05/09/2021  Vaping Use   Vaping Use: Never used  Substance and Sexual Activity   Alcohol use: Yes    Alcohol/week: 1.0 standard drink    Types: 1 Glasses of wine per week    Comment: 0-1 daily   Drug use: No   Sexual activity: Not on file  Other Topics Concern   Not on file  Social History Narrative   Divorced   Secondary school teacher- retired   No children   Has a Neurosurgeon named [name]   Enjoys outdoor activities- hiking, swimming, Control and instrumentation engineer, baseball, writing, reading, cooking   Social Determinants of Radio broadcast assistant Strain: Not on file  Food Insecurity: Not on file  Transportation Needs: Not on file  Physical Activity: Not on file  Stress: Not on file  Social Connections: Not on file  Intimate Partner Violence: Not on file      PHYSICAL EXAM  Vitals:   07/09/21 1302  BP: 118/64  Pulse: 60  Weight: 157 lb 6.4 oz (71.4 kg)   Height: 5\' 7"  (1.702 m)   Body mass index is 24.65 kg/m.  Generalized: Well developed, in no acute distress  Chest: Lungs clear to auscultation bilaterally  Neurological examination  Mentation: Alert oriented to time, place, history taking. Follows all commands speech and language fluent Cranial nerve II-XII: Extraocular movements were full, visual field were full on confrontational test Head turning and shoulder shrug  were normal and symmetric. Motor: The motor testing reveals 5 over 5 strength of all 4 extremities. Good symmetric motor tone is noted throughout.  Sensory: Sensory testing is intact to soft touch on all 4 extremities. No evidence of extinction is noted.  Gait and station: Gait is normal.    DIAGNOSTIC DATA (LABS, IMAGING, TESTING) - I reviewed patient records, labs, notes, testing and imaging myself where available.  Lab Results  Component Value Date   WBC 8.9 04/27/2021   HGB 13.6 05/21/2021   HCT 40.0 05/21/2021   MCV 101 (H) 04/27/2021   PLT 246 04/27/2021      Component Value Date/Time   NA 141 05/21/2021 0809   NA 145 (H) 04/27/2021 1420   K 4.9 05/21/2021 0809   CL 109 05/21/2021 0809   CO2 22 04/27/2021 1420   GLUCOSE 105 (H) 05/21/2021 0809   BUN 19 05/21/2021 0809   BUN 15 04/27/2021 1420   CREATININE 1.60 (H) 05/21/2021 0809   CALCIUM 8.7 04/27/2021 1420   PROT 6.1 07/27/2020 0954   ALBUMIN 4.1 07/27/2020 0954   AST 33 07/27/2020 0954   ALT 25 07/27/2020 0954   ALKPHOS 152 (H) 07/27/2020 0954   BILITOT 0.4 07/27/2020 0954   GFRNONAA 56 (L) 07/27/2020 0954   GFRAA 64 07/27/2020 0954   Lab Results  Component Value Date   CHOL 73 (L)  07/27/2020   HDL 40 07/27/2020   LDLCALC 19 07/27/2020   TRIG 59 07/27/2020   CHOLHDL 1.8 07/27/2020   Lab Results  Component Value Date   HGBA1C 5.9 (H) 08/22/2018   Lab Results  Component Value Date   [id number] 557 09/24/2018   Lab Results  Component Value Date   TSH 2.760 04/27/2021       ASSESSMENT AND PLAN 75 y.o. year old male  has a past medical history of Anal fissure, Aortic insufficiency, Arthritis, Atrial flutter ([place]), Bicuspid aortic valve, BPH (benign prostatic hyperplasia), CAD (coronary artery disease), Cataract, CKD (chronic kidney disease), stage III ([place]), Depression, Dilated aortic root ([place]), History of blood transfusion (1998), Hyperlipidemia, Hypertension, Hypogonadism in male, Kidney stones, Migraine, Mild carotid artery disease ([place]) (2009), Myocardial infarction ([place]), PVC's (premature ventricular contractions), Retinal hemorrhage (11/04/2014), Sleep apnea with use of continuous positive airway pressure (CPAP), and Thoracic aortic aneurysm (TAA). here with:  OSA on CPAP  - CPAP compliance excellent - Good treatment of AHI  - Encourage patient to use CPAP nightly and > 4 hours each night -Order sent for new machine   2.  Migraine headaches  -Controlled -Continue Fioricet-cautioned about rebound headaches  3.  Insomnia  -Continue Xanax XR 1 mg at bedtime -Increase trazodone to 75 mg at bedtime    [name], MSN, NP-C 07/09/2021, 1:18 PM [facility] [address], [place],  [address] [phone]

## 2021-07-11 ENCOUNTER — Telehealth: Payer: Self-pay | Admitting: Cardiology

## 2021-07-11 NOTE — Telephone Encounter (Signed)
Pt c/o medication issue:  1. Name of Medication:  amiodarone (PACERONE) 200 MG tablet   2. How are you currently taking this medication (dosage and times per day)?  As prescribed  3. Are you having a reaction (difficulty breathing--STAT)?  No   4. What is your medication issue?   Patient is requesting to speak with Venida Jarvis, RN. He states since starting Amiodarone he has been feeling a lot better. He states since he is doing well on the medication he would like to discuss postponing or cancelling the ablation.

## 2021-07-11 NOTE — Telephone Encounter (Signed)
Called to inform pt that Sherri, RN is not in the office today.  Pt expresses  amiodarone has helped HR.  Reports going up and down stairs with HR in the 60's very happy with the results from amiodarone.  Pt would like to discuss using medication instead of having an ablation.  Pt reports ablation is scheduled for 08/02/21 also wants to know if Cardiac CT will need to be canceled.  I advised pt that I would route message to Venida Jarvis, RN to address upon her return.

## 2021-07-12 ENCOUNTER — Telehealth: Payer: Self-pay | Admitting: Cardiology

## 2021-07-12 ENCOUNTER — Encounter (HOSPITAL_COMMUNITY): Payer: Self-pay | Admitting: Cardiology

## 2021-07-12 ENCOUNTER — Ambulatory Visit (HOSPITAL_COMMUNITY): Payer: Medicare Other | Admitting: Certified Registered Nurse Anesthetist

## 2021-07-12 ENCOUNTER — Ambulatory Visit (HOSPITAL_COMMUNITY)
Admission: RE | Admit: 2021-07-12 | Discharge: 2021-07-12 | Disposition: A | Discharge status: Home / Self Care | Payer: Medicare Other | Attending: Cardiology | Admitting: Cardiology

## 2021-07-12 ENCOUNTER — Encounter (HOSPITAL_COMMUNITY)
Admission: RE | Disposition: A | Discharge status: Home / Self Care | Payer: Self-pay | Source: Home / Self Care | Attending: Cardiology

## 2021-07-12 ENCOUNTER — Other Ambulatory Visit: Payer: Self-pay

## 2021-07-12 DIAGNOSIS — I4819 Other persistent atrial fibrillation: Secondary | ICD-10-CM | POA: Insufficient documentation

## 2021-07-12 DIAGNOSIS — I2510 Atherosclerotic heart disease of native coronary artery without angina pectoris: Secondary | ICD-10-CM | POA: Insufficient documentation

## 2021-07-12 DIAGNOSIS — G4733 Obstructive sleep apnea (adult) (pediatric): Secondary | ICD-10-CM | POA: Diagnosis not present

## 2021-07-12 DIAGNOSIS — Z7901 Long term (current) use of anticoagulants: Secondary | ICD-10-CM | POA: Insufficient documentation

## 2021-07-12 DIAGNOSIS — I129 Hypertensive chronic kidney disease with stage 1 through stage 4 chronic kidney disease, or unspecified chronic kidney disease: Secondary | ICD-10-CM | POA: Diagnosis not present

## 2021-07-12 DIAGNOSIS — I483 Typical atrial flutter: Secondary | ICD-10-CM | POA: Insufficient documentation

## 2021-07-12 DIAGNOSIS — N183 Chronic kidney disease, stage 3 unspecified: Secondary | ICD-10-CM | POA: Insufficient documentation

## 2021-07-12 DIAGNOSIS — Z79899 Other long term (current) drug therapy: Secondary | ICD-10-CM | POA: Insufficient documentation

## 2021-07-12 HISTORY — PX: CARDIOVERSION: SHX1299

## 2021-07-12 LAB — POCT I-STAT, CHEM 8
BUN: 20 mg/dL (ref 8–23)
Calcium, Ion: 1.14 mmol/L — ABNORMAL LOW (ref 1.15–1.40)
Chloride: 107 mmol/L (ref 98–111)
Creatinine, Ser: 1.7 mg/dL — ABNORMAL HIGH (ref 0.61–1.24)
Glucose, Bld: 117 mg/dL — ABNORMAL HIGH (ref 70–99)
HCT: 45 % (ref 39.0–52.0)
Hemoglobin: 15.3 g/dL (ref 13.0–17.0)
Potassium: 4.3 mmol/L (ref 3.5–5.1)
Sodium: 141 mmol/L (ref 135–145)
TCO2: 23 mmol/L (ref 22–32)

## 2021-07-12 SURGERY — CARDIOVERSION
Anesthesia: General

## 2021-07-12 MED ORDER — PROPOFOL 10 MG/ML IV BOLUS
INTRAVENOUS | Status: DC | PRN
  Administered 2021-07-12: 70 mg via INTRAVENOUS

## 2021-07-12 MED ORDER — SODIUM CHLORIDE 0.9 % IV SOLN
INTRAVENOUS | Status: DC | PRN
Start: 2021-07-12 — End: 2021-07-12

## 2021-07-12 MED ORDER — SODIUM CHLORIDE 0.9 % IV SOLN: INTRAVENOUS | Status: DC

## 2021-07-12 MED ORDER — LIDOCAINE 2% (20 MG/ML) 5 ML SYRINGE
INTRAMUSCULAR | Status: DC | PRN
  Administered 2021-07-12: 100 mg via INTRAVENOUS

## 2021-07-12 MED ORDER — SODIUM CHLORIDE 0.9 % IV SOLN
INTRAVENOUS | Status: AC | PRN
  Administered 2021-07-12: 500 mL via INTRAVENOUS

## 2021-07-12 NOTE — Telephone Encounter (Signed)
Patient calling in regards to his mychart message. He says he was wavering about whether to proceed with the ablation 1/12 or just use medication. He says he had the cardioversion this morning and Dr. Marlou Porch recommended to continue with the ablation. He say he would like to continue with the plan and get the ablation. He says he does not need a call back.

## 2021-07-12 NOTE — Interval H&P Note (Signed)
History and Physical Interval Note:  07/12/2021 12:08 PM  [name]  has presented today for surgery, with the diagnosis of A-FIB.  The various methods of treatment have been discussed with the patient and family. After consideration of risks, benefits and other options for treatment, the patient has consented to  Procedure(s): CARDIOVERSION (N/A) as a surgical intervention.  The patient's history has been reviewed, patient examined, no change in status, stable for surgery.  I have reviewed the patient's chart and labs.  Questions were answered to the patient's satisfaction.     [employer]

## 2021-07-12 NOTE — Discharge Instructions (Signed)

## 2021-07-12 NOTE — Transfer of Care (Signed)
Immediate Anesthesia Transfer of Care Note  Patient: [name]  Procedure(s) Performed: CARDIOVERSION  Patient Location: Endoscopy Unit  Anesthesia Type:General  Level of Consciousness: drowsy, patient cooperative and responds to stimulation  Airway & Oxygen Therapy: Patient Spontanous Breathing and Patient connected to nasal cannula oxygen  Post-op Assessment: Report given to RN and Post -op Vital signs reviewed and stable  Post vital signs: Reviewed and stable  Last Vitals:  Vitals Value Taken Time  BP 105/56   Temp    Pulse 54   Resp 16   SpO2 95     Last Pain:  Vitals:   07/12/21 1110  TempSrc: Temporal  PainSc: 0-No pain         Complications: No notable events documented.

## 2021-07-12 NOTE — Anesthesia Preprocedure Evaluation (Addendum)
Anesthesia Evaluation  Patient identified by MRN, date of birth, ID band Patient awake    Reviewed: Allergy & Precautions, NPO status , Patient's Chart, lab work & pertinent test results  History of Anesthesia Complications Negative for: history of anesthetic complications  Airway Mallampati: II  TM Distance: >3 FB Neck ROM: Full    Dental  (+) Teeth Intact   Pulmonary sleep apnea and Continuous Positive Airway Pressure Ventilation , former smoker,    Pulmonary exam normal        Cardiovascular hypertension, Pt. on medications and Pt. on home beta blockers + CAD, + Past MI and + CABG (1998)  + dysrhythmias Atrial Fibrillation + Valvular Problems/Murmurs AI  Rhythm:Irregular Rate:Normal     Neuro/Psych  Headaches, Anxiety Depression Mild carotid stenosis    GI/Hepatic negative GI ROS, Neg liver ROS,   Endo/Other  negative endocrine ROS  Renal/GU CRFRenal disease  negative genitourinary   Musculoskeletal  (+) Arthritis , Osteoarthritis,    Abdominal   Peds  Hematology negative hematology ROS (+) Eliquis   Anesthesia Other Findings   Reproductive/Obstetrics                             Anesthesia Physical  Anesthesia Plan  ASA: 3  Anesthesia Plan: General   Post-op Pain Management:    Induction: Intravenous  PONV Risk Score and Plan: 2 and TIVA and Treatment may vary due to age or medical condition  Airway Management Planned: Mask  Additional Equipment: None  Intra-op Plan:   Post-operative Plan:   Informed Consent: I have reviewed the patients History and Physical, chart, labs and discussed the procedure including the risks, benefits and alternatives for the proposed anesthesia with the patient or authorized representative who has indicated his/her understanding and acceptance.     Dental advisory given  Plan Discussed with: Anesthesiologist, CRNA and  Surgeon  Anesthesia Plan Comments:        Anesthesia Quick Evaluation

## 2021-07-12 NOTE — Anesthesia Postprocedure Evaluation (Signed)
Anesthesia Post Note  Patient: [name]  Procedure(s) Performed: CARDIOVERSION     Patient location during evaluation: PACU Anesthesia Type: General Level of consciousness: sedated Pain management: pain level controlled Vital Signs Assessment: post-procedure vital signs reviewed and stable Respiratory status: spontaneous breathing and respiratory function stable Cardiovascular status: stable Postop Assessment: no apparent nausea or vomiting Anesthetic complications: no   No notable events documented.  Last Vitals:  Vitals:   07/12/21 1247 07/12/21 1254  BP: (!) 113/56 116/62  Pulse: (!) 56 (!) 57  Resp: 12 16  Temp:    SpO2: 98% 98%    Last Pain:  Vitals:   07/12/21 1254  TempSrc:   PainSc: 0-No pain                 Islah Eve DANIEL

## 2021-07-12 NOTE — CV Procedure (Signed)
° ° °  Electrical Cardioversion Procedure Note [name] [id number] [birth year]  Procedure: Electrical Cardioversion Indications:  Atrial Fibrillation  Time Out: Verified patient identification, verified procedure,medications/allergies/relevent history reviewed, required imaging and test results available.  Performed  Procedure Details  The patient was NPO after midnight. Anesthesia was administered at the beside  by Dr.Singer with 80mg  of propofol.  Cardioversion was performed with synchronized biphasic defibrillation via AP pads with 200 joules.  1 attempt(s) were performed.  The patient converted to normal sinus rhythm. The patient tolerated the procedure well   IMPRESSION:  Successful cardioversion of atrial fibrillation on amiodarone.  Encouraged second ablation (to hopefully avoid long term amiodarone)    [name] 07/12/2021, 12:17 PM

## 2021-07-16 ENCOUNTER — Encounter (HOSPITAL_COMMUNITY): Payer: Self-pay | Admitting: Cardiology

## 2021-07-17 ENCOUNTER — Encounter: Payer: Self-pay | Admitting: Cardiology

## 2021-07-17 ENCOUNTER — Other Ambulatory Visit: Payer: Self-pay

## 2021-07-17 ENCOUNTER — Ambulatory Visit (INDEPENDENT_AMBULATORY_CARE_PROVIDER_SITE_OTHER): Payer: Medicare Other | Admitting: Cardiology

## 2021-07-17 ENCOUNTER — Telehealth: Payer: Self-pay | Admitting: Cardiology

## 2021-07-17 VITALS — BP 120/76 | HR 56 | Ht 66.0 in | Wt 163.6 lb

## 2021-07-17 DIAGNOSIS — I5031 Acute diastolic (congestive) heart failure: Secondary | ICD-10-CM

## 2021-07-17 DIAGNOSIS — I10 Essential (primary) hypertension: Secondary | ICD-10-CM

## 2021-07-17 DIAGNOSIS — Z79899 Other long term (current) drug therapy: Secondary | ICD-10-CM | POA: Diagnosis not present

## 2021-07-17 MED ORDER — FUROSEMIDE 40 MG PO TABS: 40.0000 mg | ORAL_TABLET | Freq: Two times a day (BID) | ORAL | 3 refills | Status: DC

## 2021-07-17 MED ORDER — METOLAZONE 5 MG PO TABS: 5.0000 mg | ORAL_TABLET | Freq: Every day | ORAL | 0 refills | Status: DC

## 2021-07-17 NOTE — Telephone Encounter (Addendum)
Called patient back about his message. Patient complaining of edema from the waist down that is non-pitting and very tight. Minimal shortness of breath BP 112/68 and HR 60's. Patient had cardioversion on 07/12/21, 5 days ago and has been feeling good until yesterday. Patient had echo in February that showed EF 60 to 65 % and thoracic aortic aneurysm that is followed by Dr. Clementeen Graham at [facility]. Patient denies any chest pain or any other symptoms. Discussed with DOD, Dr. Rayann Heman, he said to get him in this week to be evaluated. Patient is very concerned and worried, made patient an appointment today with DOD, Dr. Harriet Masson at [place] office this afternoon. Encouraged patient in the mean time to elevate his legs and be on a sodium restriction diet.

## 2021-07-17 NOTE — Patient Instructions (Addendum)
Medication Instructions:  Your physician has recommended you make the following change in your medication:  START: Furosemide (Lasix) 40 mg twice daily ONCE: Metolazone 5 mg once - 30 minutes before first Lasix dose  Cut Amlodipine in half for 5 mg Tomorrow and Thursday.  *If you need a refill on your cardiac medications before your next appointment, please call your pharmacy*   Lab Work: Your physician recommends that you return for lab work in:  TODAY: BMET, Mag, BNP If you have labs (blood work) drawn today and your tests are completely normal, you will receive your results only by: MyChart Message (if you have [place]) OR A paper copy in the mail If you have any lab test that is abnormal or we need to change your treatment, we will call you to review the results.   Testing/Procedures: None   Follow-Up: At [facility], you and your health needs are our priority.  As part of our continuing mission to provide you with exceptional heart care, we have created designated Provider Care Teams.  These Care Teams include your primary Cardiologist (physician) and Advanced Practice Providers (APPs -  Physician Assistants and Nurse Practitioners) who all work together to provide you with the care you need, when you need it.  We recommend signing up for the patient portal called "MyChart".  Sign up information is provided on this After Visit Summary.  MyChart is used to connect with patients for Virtual Visits (Telemedicine).  Patients are able to view lab/test results, encounter notes, upcoming appointments, etc.  Non-urgent messages can be sent to your provider as well.   To learn more about what you can do with MyChart, go to [email or web address].    Your next appointment:    Thursday 12/29 at 3:30 pm  The format for your next appointment:   In Person  Provider:   Berniece Salines, DO  Other Instructions

## 2021-07-17 NOTE — Progress Notes (Signed)
Cardiology Office Note:    Date:  07/19/2021   ID:  [name], DOB 01-25-1946, MRN [id number]  PCP:  Drew Freestone, MD  Cardiologist:  Drew Him, MD  Electrophysiologist:  Drew Haw, MD   Referring MD: Drew Freestone, MD   " I am having leg swelling"  History of Present Illness:    [name] is a 75 y.o. male with a hx of atrial flutter that is post cardioversion pending ablation in January 2023, coronary artery disease status post CABG back in 1998, bicuspid aortic valve with moderate to severe aortic regurgitation, dilated aortic root, hypertension, hyperlipidemia, OSA.  The patient normally follows with Dr. Golden [name].  He recently underwent DC cardioversion which converted to sinus rhythm.  He is here today because in the last 48 hours he is experienced significant leg swelling tells me.  He feels that his legs are heavy and he is gaining at least close to 7 pounds.  No other complaints at the times.  He denies any orthopnea or PND.  Past Medical History:  Diagnosis Date   Anal fissure    Aortic insufficiency    Arthritis    Atrial flutter (HCC)    Bicuspid aortic valve    with mild to moderate AR by echo 08/2019   BPH (benign prostatic hyperplasia)    CAD (coronary artery disease)    s/p CABG [place]   Cataract    bilateral-removed   CKD (chronic kidney disease), stage III ([place])    Depression    Dilated aortic root (HCC)    History of blood transfusion 1998   "due to cardiac bypass surgery"   Hyperlipidemia    Hypertension    Hypogonadism in male    Kidney stones    Migraine    Mild carotid artery disease ([place]) 2009   Myocardial infarction ([place])    PVC's (premature ventricular contractions)    Retinal hemorrhage 11/04/2014   Sleep apnea with use of continuous positive airway pressure (CPAP)    Thoracic aortic aneurysm (TAA)     Past Surgical History:  Procedure Laterality Date   A-FLUTTER ABLATION N/A 09/29/2019    Procedure: A-FLUTTER ABLATION;  Surgeon: Drew Haw, MD;  Location: [facility];  Service: Cardiovascular;  Laterality: N/A;   CARDIOVERSION N/A 08/10/2019   Procedure: CARDIOVERSION;  Surgeon: Drew Munroe, MD;  Location: [facility] ENDOSCOPY;  Service: Cardiovascular;  Laterality: N/A;   CARDIOVERSION N/A 05/21/2021   Procedure: CARDIOVERSION;  Surgeon: Drew Hector, MD;  Location: [facility] ENDOSCOPY;  Service: Cardiovascular;  Laterality: N/A;   CARDIOVERSION N/A 07/12/2021   Procedure: CARDIOVERSION;  Surgeon: Drew Pain, MD;  Location: [facility] ENDOSCOPY;  Service: Cardiovascular;  Laterality: N/A;   cataracts     COLONOSCOPY     CORONARY ARTERY BYPASS GRAFT  1998   LEFT HEART CATH AND CORONARY ANGIOGRAPHY N/A 08/24/2018   Procedure: LEFT HEART CATH AND CORONARY ANGIOGRAPHY;  Surgeon: Drew Harp, MD;  Location: [facility];  Service: Cardiovascular;  Laterality: N/A;   TEE WITHOUT CARDIOVERSION N/A 01/19/2015   Procedure: TRANSESOPHAGEAL ECHOCARDIOGRAM (TEE);  Surgeon: Drew Margarita, MD;  Location: [facility] ENDOSCOPY;  Service: Cardiovascular;  Laterality: N/A;   [name]   pt denies    Current Medications: Current Meds  Medication Sig   acetaminophen (TYLENOL) 325 MG tablet Take 650 mg by mouth every 6 (six) hours as needed for mild [name] or moderate  [name].   ALPRAZOLAM XR 1 MG 24 hr tablet TAKE ONE TABLET BY MOUTH DAILY (Patient taking differently: Take 1 mg by mouth every evening.)   amiodarone (PACERONE) 200 MG tablet Take 2 tablets (400 mg total) TWICE daily for two weeks, then reduce and take 1 tablet (200 mg total) ONCE a day (Patient taking differently: Take 200 mg by mouth daily.)   amLODipine (NORVASC) 10 MG tablet Take 1 tablet (10 mg total) by mouth daily.   apixaban (ELIQUIS) 5 MG TABS tablet Take 1 tablet (5 mg total) by mouth 2 (two) times daily.   Ascorbic Acid (VITAMIN C) 1000 MG tablet Take 1,000 mg by mouth 2 (two) times daily.    aspirin-acetaminophen-caffeine (EXCEDRIN MIGRAINE) 250-250-65 MG tablet Take 2 tablets by mouth daily as needed for headache.    butalbital-aspirin-caffeine-codeine ([place]) 50-325-40-30 MG capsule Prn use for break through migraine (Patient taking differently: Take 1 capsule by mouth every 8 (eight) hours as needed for migraine. Prn use for break through migraine)   diazepam (VALIUM) 5 MG tablet Take 2.5-5 mg by mouth 2 (two) times daily as needed for anxiety.    furosemide (LASIX) 40 MG tablet Take 1 tablet (40 mg total) by mouth 2 (two) times daily.   ibuprofen (ADVIL,MOTRIN) 200 MG tablet Take 200-400 mg by mouth every 8 (eight) hours as needed (for arthritis in the feet).   losartan (COZAAR) 25 MG tablet Take 1 tablet (25 mg total) by mouth daily.   metolazone (ZAROXOLYN) 5 MG tablet Take 1 tablet (5 mg total) by mouth daily.   Nebivolol HCl (BYSTOLIC) 20 MG TABS Take 1 tablet (20 mg total) by mouth daily.   niacin 500 MG tablet Take 500 mg by mouth at bedtime.   rosuvastatin (CRESTOR) 20 MG tablet Take 1 tablet (20 mg total) by mouth daily. (Patient taking differently: Take 20 mg by mouth every evening.)   Specialty Vitamins Products (CENTRUM SPECIALIST ENERGY) TABS Take 1 tablet by mouth daily with breakfast.   tadalafil (CIALIS) 5 MG tablet Take 5 mg by mouth every evening.   Testosterone 40.5 MG/2.5GM (1.62%) GEL Place 4 Pump onto the skin daily. Apply 4 pumps to each shoulder every morning   traZODone (DESYREL) 50 MG tablet Take 1.5 tablets (75 mg total) by mouth at bedtime.   Turmeric 500 MG CAPS Take 500 mg by mouth daily.   vitamin E 400 UNIT capsule Take 400 Units by mouth at bedtime.    Current Facility-Administered Medications for the 07/17/21 encounter (Office Visit) with Berniece Salines, DO  Medication   0.9 %  sodium chloride infusion   0.9 %  sodium chloride infusion     Allergies:   Dog epithelium allergy skin test and Mushroom extract complex   Social  History   Socioeconomic History   Marital status: Divorced    Spouse name: Not on file   Number of children: 0   Years of education: Not on file   Highest education level: Not on file  Occupational History   Occupation: retired  Tobacco Use   Smoking status: Former    Years: 20.00    Types: Cigarettes    Quit date: 07/22/1986    Years since quitting: 35.0   Smokeless tobacco: Never   Tobacco comments:    Former smoker 05/09/2021  Vaping Use   Vaping Use: Never used  Substance and Sexual Activity   Alcohol use: Yes    Alcohol/week: 1.0 standard drink    Types:  1 Glasses of wine per week    Comment: 0-1 daily   Drug use: No   Sexual activity: Not on file  Other Topics Concern   Not on file  Social History Narrative   Divorced   Secondary school teacher- retired   No children   Has a Neurosurgeon named [name]   Enjoys outdoor activities- hiking, swimming, Control and instrumentation engineer, baseball, writing, reading, cooking   Social Determinants of Radio broadcast assistant Strain: Not on Art therapist Insecurity: Not on file  Transportation Needs: Not on file  Physical Activity: Not on file  Stress: Not on file  Social Connections: Not on file     Family History: The patient's family history includes Arthritis in his father and mother; Diabetes in his father and mother; Heart disease (age of onset: 48) in his father; Hyperlipidemia in his father and mother; Hypertension in his father; Kidney disease in his paternal grandfather; Stroke (age of onset: 21) in his father. There is no history of Colon cancer, Colon polyps, Esophageal cancer, Stomach cancer, Rectal cancer, or Sleep apnea.  ROS:   Review of Systems  Constitution: Negative for decreased appetite, fever and weight gain.  HENT: Negative for congestion, ear discharge, hoarse voice and sore throat.   Eyes: Negative for discharge, redness, vision loss in right eye and visual halos.  Cardiovascular: Reports significant leg swelling.  Negative for chest  [name], dyspnea on exertion,  orthopnea and palpitations.  Respiratory: Negative for cough, hemoptysis, shortness of breath and snoring.   Endocrine: Negative for heat intolerance and polyphagia.  Hematologic/Lymphatic: Negative for bleeding problem. Does not bruise/bleed easily.  Skin: Negative for flushing, nail changes, rash and suspicious lesions.  Musculoskeletal: Negative for arthritis, joint [name], muscle cramps, myalgias, neck [name] and stiffness.  Gastrointestinal: Negative for abdominal [name], bowel incontinence, diarrhea and excessive appetite.  Genitourinary: Negative for decreased libido, genital sores and incomplete emptying.  Neurological: Negative for brief paralysis, focal weakness, headaches and loss of balance.  Psychiatric/Behavioral: Negative for altered mental status, depression and suicidal ideas.  Allergic/Immunologic: Negative for HIV exposure and persistent infections.    EKGs/Labs/Other Studies Reviewed:    The following studies were reviewed today:   EKG: None today  TTE 08/28/2020 IMPRESSIONS   1. Aorta measurements are stable compared with prior study 09/07/2019.  Aortic dilatation noted. Aneurysm of the aortic root, measuring 51 mm. 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   2. Tricuspid aortic valve (clearly tricuspid on TEE 2016). Mild to moderate eccentric AI related to incomplete leaflet closure from aortic  root aneurysm. The aortic valve is tricuspid. Aortic valve regurgitation is mild to moderate. No aortic stenosis  is present.   3. Left ventricular ejection fraction, by estimation, is 60 to 65%. The left ventricle has normal function. The left ventricle has no regional  wall motion abnormalities. There is mild concentric left ventricular hypertrophy. Left ventricular diastolic parameters are indeterminate.   4. Right ventricular systolic function is normal. The right ventricular size is normal. There is normal pulmonary artery systolic pressure.  The estimated right ventricular systolic pressure is 44.8 mmHg.   5. The mitral valve is grossly normal. No evidence of mitral valve regurgitation. No evidence of mitral stenosis.   6. The inferior vena cava is normal in size with greater than 50% respiratory variability, suggesting right atrial pressure of 3 mmHg.   Comparison(s): No significant change from prior study.   FINDINGS   Left Ventricle: Left ventricular ejection fraction, by estimation, is  60 to 65%. The left ventricle has normal function. The left ventricle has no regional wall motion abnormalities. The left ventricular internal cavity size was normal in size. There is  mild concentric left ventricular hypertrophy. Left ventricular diastolic parameters are indeterminate.   Right Ventricle: The right ventricular size is normal. No increase in right ventricular wall thickness. Right ventricular systolic function is  normal. There is normal pulmonary artery systolic pressure. The tricuspid regurgitant velocity is 2.70 m/s, and   with an assumed right atrial pressure of 3 mmHg, the estimated right ventricular systolic pressure is 73.4 mmHg.   Left Atrium: Left atrial size was normal in size.   Right Atrium: Right atrial size was normal in size.   Pericardium: Trivial pericardial effusion is present.   Mitral Valve: The mitral valve is grossly normal. Mild mitral annular calcification. No evidence of mitral valve regurgitation. No evidence of  mitral valve stenosis.   Tricuspid Valve: The tricuspid valve is grossly normal. Tricuspid valve regurgitation is mild . No evidence of tricuspid stenosis.   Aortic Valve: Tricuspid aortic valve (clearly tricuspid on TEE 2016). Mild to moderate eccentric AI related to incomplete leaflet closure from aortic root aneurysm. The aortic valve is tricuspid. Aortic valve regurgitation is mild to moderate. Aortic  regurgitation PHT measures 438 msec. No aortic stenosis is present.   Pulmonic Valve:  The pulmonic valve was grossly normal. Pulmonic valve regurgitation is not visualized. No evidence of pulmonic stenosis.   Aorta: Aorta measurements are stable compared with prior study 2/16/202. Aortic dilatation noted. There is an aneurysm involving the aortic root measuring 51 mm. There is an aneurysm involving the ascending aorta measuring 49 mm.   Venous: The inferior vena cava is normal in size with greater than 50% respiratory variability, suggesting right atrial pressure of 3 mmHg.   IAS/Shunts: The atrial septum is grossly normal.      Recent Labs: 07/27/2020: ALT 25 04/27/2021: Platelets 246; TSH 2.760 07/12/2021: Hemoglobin 15.3 07/17/2021: BUN 21; Creatinine, Ser 1.69; Magnesium 2.4; NT-Pro BNP 949; Potassium 4.6; Sodium 145  Recent Lipid Panel    Component Value Date/Time   CHOL 73 (L) 07/27/2020 0954   TRIG 59 07/27/2020 0954   HDL 40 07/27/2020 0954   CHOLHDL 1.8 07/27/2020 0954   CHOLHDL 2.8 08/22/2018 0645   VLDL 12 08/22/2018 0645   LDLCALC 19 07/27/2020 0954    Physical Exam:    VS:  BP 120/76    Pulse (!) 56    Ht 5\' 6"  (1.676 m)    Wt 163 lb 9.6 oz (74.2 kg)    SpO2 98%    BMI 26.41 kg/m     Wt Readings from Last 3 Encounters:  07/17/21 163 lb 9.6 oz (74.2 kg)  07/12/21 157 lb 6.4 oz (71.4 kg)  07/09/21 157 lb 6.4 oz (71.4 kg)     GEN: Well nourished, well developed in no acute distress HEENT: Normal NECK: No JVD; No carotid bruits LYMPHATICS: No lymphadenopathy CARDIAC: S1S2 noted,RRR, no murmurs, rubs, gallops RESPIRATORY:  Clear to auscultation without rales, wheezing or rhonchi  ABDOMEN: Soft, non-tender, non-distended, +bowel sounds, no guarding. EXTREMITIES: +2 bilateral lower extremity edema, No cyanosis, no clubbing MUSCULOSKELETAL:  No deformity  SKIN: Warm and dry NEUROLOGIC:  Alert and oriented x 3, non-focal PSYCHIATRIC:  Normal affect, good insight  ASSESSMENT:    1. Medication management   2. Acute diastolic congestive heart failure  ([place])   3. Essential hypertension    PLAN:  Significant lower extremity edema thankfully no PND orthopnea.  Ideally the patient should be admitted in the hospital for IV diuretics.  But he prefers not to at this time.  Some planing still give the patient Lasix 40 mg twice daily starting tonight.  Tomorrow he is currently 1 dose of metolazone.  I plan to see the patient back on Thursday should he not improve it will be recommended again at that time for emergency department evaluation for IV diuretics.   The patient is in agreement with the above plan. The patient left the office in stable condition.  The patient will follow up in 2 days.   Medication Adjustments/Labs and Tests Ordered: Current medicines are reviewed at length with the patient today.  Concerns regarding medicines are outlined above.  Orders Placed This Encounter  Procedures   Basic Metabolic Panel (BMET)   Magnesium   Pro b natriuretic peptide (BNP)   Meds ordered this encounter  Medications   furosemide (LASIX) 40 MG tablet    Sig: Take 1 tablet (40 mg total) by mouth 2 (two) times daily.    Dispense:  180 tablet    Refill:  3   metolazone (ZAROXOLYN) 5 MG tablet    Sig: Take 1 tablet (5 mg total) by mouth daily.    Dispense:  1 tablet    Refill:  0    Patient Instructions  Medication Instructions:  Your physician has recommended you make the following change in your medication:  START: Furosemide (Lasix) 40 mg twice daily ONCE: Metolazone 5 mg once - 30 minutes before first Lasix dose  Cut Amlodipine in half for 5 mg Tomorrow and Thursday.  *If you need a refill on your cardiac medications before your next appointment, please call your pharmacy*   Lab Work: Your physician recommends that you return for lab work in:  TODAY: BMET, Mag, BNP If you have labs (blood work) drawn today and your tests are completely normal, you will receive your results only by: MyChart Message (if you have [place]) OR A paper  copy in the mail If you have any lab test that is abnormal or we need to change your treatment, we will call you to review the results.   Testing/Procedures: None   Follow-Up: At [facility], you and your health needs are our priority.  As part of our continuing mission to provide you with exceptional heart care, we have created designated Provider Care Teams.  These Care Teams include your primary Cardiologist (physician) and Advanced Practice Providers (APPs -  Physician Assistants and Nurse Practitioners) who all work together to provide you with the care you need, when you need it.  We recommend signing up for the patient portal called "MyChart".  Sign up information is provided on this After Visit Summary.  MyChart is used to connect with patients for Virtual Visits (Telemedicine).  Patients are able to view lab/test results, encounter notes, upcoming appointments, etc.  Non-urgent messages can be sent to your provider as well.   To learn more about what you can do with MyChart, go to [email or web address].    Your next appointment:    Thursday 12/29 at 3:30 pm  The format for your next appointment:   In Person  Provider:   Berniece Salines, DO  Other Instructions     Adopting a Healthy Lifestyle.  Know what a healthy weight is for you (roughly BMI <25) and aim to maintain this   Aim for 7+ servings of fruits and  vegetables daily   65-80+ fluid ounces of water or unsweet tea for healthy kidneys   Limit to max 1 drink of alcohol per day; avoid smoking/tobacco   Limit animal fats in diet for cholesterol and heart health - choose grass fed whenever available   Avoid highly processed foods, and foods high in saturated/trans fats   Aim for low stress - take time to unwind and care for your mental health   Aim for 150 min of moderate intensity exercise weekly for heart health, and weights twice weekly for bone health   Aim for 7-9 hours of sleep daily   When it comes to  diets, agreement about the perfect plan isnt easy to find, even among the experts. Experts at the [place] developed an idea known as the Healthy Eating Plate. Just imagine a plate divided into logical, healthy portions.   The emphasis is on diet quality:   Load up on vegetables and fruits - one-half of your plate: Aim for color and variety, and remember that potatoes dont count.   Go for whole grains - one-quarter of your plate: Whole wheat, barley, wheat berries, quinoa, oats, brown rice, and foods made with them. If you want pasta, go with whole wheat pasta.   Protein power - one-quarter of your plate: Fish, chicken, beans, and nuts are all healthy, versatile protein sources. Limit red meat.   The diet, however, does go beyond the plate, offering a few other suggestions.   Use healthy plant oils, such as olive, canola, soy, corn, sunflower and peanut. Check the labels, and avoid partially hydrogenated oil, which have unhealthy trans fats.   If youre thirsty, drink water. Coffee and tea are good in moderation, but skip sugary drinks and limit milk and dairy products to one or two daily servings.   The type of carbohydrate in the diet is more important than the amount. Some sources of carbohydrates, such as vegetables, fruits, whole grains, and beans-are healthier than others.   Finally, stay active  Signed, Berniece Salines, DO  07/19/2021 10:49 AM    [place]

## 2021-07-17 NOTE — Telephone Encounter (Signed)
Pt c/o swelling: STAT is pt has developed SOB within 24 hours  If swelling, where is the swelling located? Feet to the waist  How much weight have you gained and in what time span? Not sure but believes he has gained weight  Have you gained 3 pounds in a day or 5 pounds in a week? Not sure  Do you have a log of your daily weights (if so, list)? no  Are you currently taking a fluid pill? no  Are you currently SOB? no  Have you traveled recently? no  Patient states he is very concerned, because he has swelling from his feet all the way up to his waist. He says he even has swelling in his genitals. Patient states he did not go too overboard with eating over the holiday and is concerned the swelling is cardiac related.

## 2021-07-18 LAB — BASIC METABOLIC PANEL
BUN/Creatinine Ratio: 12 (ref 10–24)
BUN: 21 mg/dL (ref 8–27)
CO2: 22 mmol/L (ref 20–29)
Calcium: 8.7 mg/dL (ref 8.6–10.2)
Chloride: 109 mmol/L — ABNORMAL HIGH (ref 96–106)
Creatinine, Ser: 1.69 mg/dL — ABNORMAL HIGH (ref 0.76–1.27)
Glucose: 105 mg/dL — ABNORMAL HIGH (ref 70–99)
Potassium: 4.6 mmol/L (ref 3.5–5.2)
Sodium: 145 mmol/L — ABNORMAL HIGH (ref 134–144)
eGFR: 42 mL/min/{1.73_m2} — ABNORMAL LOW (ref >59)

## 2021-07-18 LAB — PRO B NATRIURETIC PEPTIDE: NT-Pro BNP: 949 pg/mL — ABNORMAL HIGH (ref 0–376)

## 2021-07-18 LAB — MAGNESIUM: Magnesium: 2.4 mg/dL — ABNORMAL HIGH (ref 1.6–2.3)

## 2021-07-19 ENCOUNTER — Encounter: Payer: Self-pay | Admitting: Cardiology

## 2021-07-19 ENCOUNTER — Other Ambulatory Visit: Payer: Self-pay

## 2021-07-19 ENCOUNTER — Ambulatory Visit (INDEPENDENT_AMBULATORY_CARE_PROVIDER_SITE_OTHER): Payer: Medicare Other | Admitting: Cardiology

## 2021-07-19 VITALS — BP 100/62 | HR 82 | Ht 66.0 in | Wt 148.0 lb

## 2021-07-19 DIAGNOSIS — I5031 Acute diastolic (congestive) heart failure: Secondary | ICD-10-CM | POA: Insufficient documentation

## 2021-07-19 DIAGNOSIS — I10 Essential (primary) hypertension: Secondary | ICD-10-CM

## 2021-07-19 DIAGNOSIS — I2510 Atherosclerotic heart disease of native coronary artery without angina pectoris: Secondary | ICD-10-CM

## 2021-07-19 DIAGNOSIS — I483 Typical atrial flutter: Secondary | ICD-10-CM | POA: Diagnosis not present

## 2021-07-19 DIAGNOSIS — Z79899 Other long term (current) drug therapy: Secondary | ICD-10-CM | POA: Insufficient documentation

## 2021-07-19 NOTE — Patient Instructions (Signed)
Medication Instructions:  Your physician recommends that you continue on your current medications as directed. Please refer to the Current Medication list given to you today.  Continue: Lasix 40 mg twice daily until Sunday then start once daily Continue: Amlodipine 5 mg (half tablet) until Sunday then start Amlodipine 10 mg (whole tablet).   *If you need a refill on your cardiac medications before your next appointment, please call your pharmacy*   Lab Work: None If you have labs (blood work) drawn today and your tests are completely normal, you will receive your results only by: [place] (if you have MyChart) OR A paper copy in the mail If you have any lab test that is abnormal or we need to change your treatment, we will call you to review the results.   Testing/Procedures: None   Follow-Up: At [facility], you and your health needs are our priority.  As part of our continuing mission to provide you with exceptional heart care, we have created designated Provider Care Teams.  These Care Teams include your primary Cardiologist (physician) and Advanced Practice Providers (APPs -  Physician Assistants and Nurse Practitioners) who all work together to provide you with the care you need, when you need it.  We recommend signing up for the patient portal called "MyChart".  Sign up information is provided on this After Visit Summary.  MyChart is used to connect with patients for Virtual Visits (Telemedicine).  Patients are able to view lab/test results, encounter notes, upcoming appointments, etc.  Non-urgent messages can be sent to your provider as well.   To learn more about what you can do with MyChart, go to [email or web address].    Your next appointment:   Jan 7th @ 8:20am   The format for your next appointment:   In Person  Provider:   Fransico Him, MD     Other Instructions

## 2021-07-19 NOTE — Progress Notes (Signed)
Cardiology Office Note:    Date:  07/19/2021   ID:  [name], DOB Jun 07, 1946, MRN [id number]  PCP:  Burman Freestone, MD  Cardiologist:  Fransico Him, MD  Electrophysiologist:  Constance Haw, MD   Referring MD: Burman Freestone, MD   " I feel a lot better"  History of Present Illness:    [name] is a 75 y.o. male with a hx of atrial flutter that is post cardioversion pending ablation in January 2023, coronary artery disease status post CABG back in 1998, bicuspid aortic valve with moderate to severe aortic regurgitation, dilated aortic root, hypertension, hyperlipidemia, OSA.  The patient normally follows with Dr. Golden Hurter.  I saw the patient on July 18, 2019 at that time he presented as he was experiencing significant leg swelling as well as shortness of breath.  He had gained close to 7 pounds.  He was just recently post cardioversion.  During that visit I started the patient on Lasix 40 mg twice a day and gave him a one-time dose of metolazone.  He is here today for follow-up.  He is very happy as he has lost a tremendous amount of weight. today he is 148- was 163.   Past Medical History:  Diagnosis Date   Anal fissure    Aortic insufficiency    Arthritis    Atrial flutter (HCC)    Bicuspid aortic valve    with mild to moderate AR by echo 08/2019   BPH (benign prostatic hyperplasia)    CAD (coronary artery disease)    s/p CABG [facility] [place]   Cataract    bilateral-removed   CKD (chronic kidney disease), stage III ([place])    Depression    Dilated aortic root (HCC)    History of blood transfusion 1998   "due to cardiac bypass surgery"   Hyperlipidemia    Hypertension    Hypogonadism in male    Kidney stones    Migraine    Mild carotid artery disease ([place]) 2009   Myocardial infarction ([place])    PVC's (premature ventricular contractions)    Retinal hemorrhage 11/04/2014   Sleep apnea with use of continuous positive airway pressure (CPAP)     Thoracic aortic aneurysm (TAA)     Past Surgical History:  Procedure Laterality Date   A-FLUTTER ABLATION N/A 09/29/2019   Procedure: A-FLUTTER ABLATION;  Surgeon: Constance Haw, MD;  Location: [place] [facility];  Service: Cardiovascular;  Laterality: N/A;   CARDIOVERSION N/A 08/10/2019   Procedure: CARDIOVERSION;  Surgeon: Elouise Munroe, MD;  Location: [facility] ENDOSCOPY;  Service: Cardiovascular;  Laterality: N/A;   CARDIOVERSION N/A 05/21/2021   Procedure: CARDIOVERSION;  Surgeon: Josue Hector, MD;  Location: [facility] ENDOSCOPY;  Service: Cardiovascular;  Laterality: N/A;   CARDIOVERSION N/A 07/12/2021   Procedure: CARDIOVERSION;  Surgeon: Jerline Pain, MD;  Location: [facility] ENDOSCOPY;  Service: Cardiovascular;  Laterality: N/A;   cataracts     COLONOSCOPY     CORONARY ARTERY BYPASS GRAFT  1998   LEFT HEART CATH AND CORONARY ANGIOGRAPHY N/A 08/24/2018   Procedure: LEFT HEART CATH AND CORONARY ANGIOGRAPHY;  Surgeon: Lorretta Harp, MD;  Location: [place] [facility];  Service: Cardiovascular;  Laterality: N/A;   TEE WITHOUT CARDIOVERSION N/A 01/19/2015   Procedure: TRANSESOPHAGEAL ECHOCARDIOGRAM (TEE);  Surgeon: Sueanne Margarita, MD;  Location: [facility] ENDOSCOPY;  Service: Cardiovascular;  Laterality: N/A;   [place]   pt denies  Current Medications: Current Meds  Medication Sig   acetaminophen (TYLENOL) 325 MG tablet Take 650 mg by mouth every 6 (six) hours as needed for mild pain or moderate pain.   ALPRAZOLAM XR 1 MG 24 hr tablet TAKE ONE TABLET BY MOUTH DAILY (Patient taking differently: Take 1 mg by mouth every evening.)   amiodarone (PACERONE) 200 MG tablet Take 2 tablets (400 mg total) TWICE daily for two weeks, then reduce and take 1 tablet (200 mg total) ONCE a day (Patient taking differently: Take 200 mg by mouth daily.)   amLODipine (NORVASC) 10 MG tablet Take 1 tablet (10 mg total) by mouth daily.   apixaban (ELIQUIS) 5 MG TABS tablet Take 1 tablet (5 mg  total) by mouth 2 (two) times daily.   Ascorbic Acid (VITAMIN C) 1000 MG tablet Take 1,000 mg by mouth 2 (two) times daily.   aspirin-acetaminophen-caffeine (EXCEDRIN MIGRAINE) 250-250-65 MG tablet Take 2 tablets by mouth daily as needed for headache.    butalbital-aspirin-caffeine-codeine ([place]) 50-325-40-30 MG capsule Prn use for break through migraine (Patient taking differently: Take 1 capsule by mouth every 8 (eight) hours as needed for migraine. Prn use for break through migraine)   diazepam (VALIUM) 5 MG tablet Take 2.5-5 mg by mouth 2 (two) times daily as needed for anxiety.    furosemide (LASIX) 40 MG tablet Take 1 tablet (40 mg total) by mouth 2 (two) times daily.   ibuprofen (ADVIL,MOTRIN) 200 MG tablet Take 200-400 mg by mouth every 8 (eight) hours as needed (for arthritis in the feet).   losartan (COZAAR) 25 MG tablet Take 1 tablet (25 mg total) by mouth daily.   metolazone (ZAROXOLYN) 5 MG tablet Take 1 tablet (5 mg total) by mouth daily.   Nebivolol HCl (BYSTOLIC) 20 MG TABS Take 1 tablet (20 mg total) by mouth daily.   niacin 500 MG tablet Take 500 mg by mouth at bedtime.   rosuvastatin (CRESTOR) 20 MG tablet Take 1 tablet (20 mg total) by mouth daily. (Patient taking differently: Take 20 mg by mouth every evening.)   Specialty Vitamins Products (CENTRUM SPECIALIST ENERGY) TABS Take 1 tablet by mouth daily with breakfast.   tadalafil (CIALIS) 5 MG tablet Take 5 mg by mouth every evening.   Testosterone 40.5 MG/2.5GM (1.62%) GEL Place 4 Pump onto the skin daily. Apply 4 pumps to each shoulder every morning   traZODone (DESYREL) 50 MG tablet Take 1.5 tablets (75 mg total) by mouth at bedtime.   Turmeric 500 MG CAPS Take 500 mg by mouth daily.   vitamin E 400 UNIT capsule Take 400 Units by mouth at bedtime.      Allergies:   Dog epithelium allergy skin test and Mushroom extract complex   Social History   Socioeconomic History   Marital status: Divorced     Spouse name: Not on file   Number of children: 0   Years of education: Not on file   Highest education level: Not on file  Occupational History   Occupation: retired  Tobacco Use   Smoking status: Former    Years: 20.00    Types: Cigarettes    Quit date: 07/22/1986    Years since quitting: 35.0   Smokeless tobacco: Never   Tobacco comments:    Former smoker 05/09/2021  Vaping Use   Vaping Use: Never used  Substance and Sexual Activity   Alcohol use: Yes    Alcohol/week: 1.0 standard drink    Types: 1 Glasses of  wine per week    Comment: 0-1 daily   Drug use: No   Sexual activity: Not on file  Other Topics Concern   Not on file  Social History Narrative   Divorced   Secondary school teacher- retired   No children   Has a Neurosurgeon named [name]   Enjoys outdoor activities- hiking, swimming, Control and instrumentation engineer, baseball, writing, reading, cooking   Social Determinants of Radio broadcast assistant Strain: Not on Art therapist Insecurity: Not on file  Transportation Needs: Not on file  Physical Activity: Not on file  Stress: Not on file  Social Connections: Not on file     Family History: The patient's family history includes Arthritis in his father and mother; Diabetes in his father and mother; Heart disease (age of onset: 58) in his father; Hyperlipidemia in his father and mother; Hypertension in his father; Kidney disease in his paternal grandfather; Stroke (age of onset: 5) in his father. There is no history of Colon cancer, Colon polyps, Esophageal cancer, Stomach cancer, Rectal cancer, or Sleep apnea.  ROS:   Review of Systems  Constitution: Negative for decreased appetite, fever and weight gain.  HENT: Negative for congestion, ear discharge, hoarse voice and sore throat.   Eyes: Negative for discharge, redness, vision loss in right eye and visual halos.  Cardiovascular: Negative for chest pain, dyspnea on exertion, leg swelling, orthopnea and palpitations.  Respiratory: Negative for cough,  hemoptysis, shortness of breath and snoring.   Endocrine: Negative for heat intolerance and polyphagia.  Hematologic/Lymphatic: Negative for bleeding problem. Does not bruise/bleed easily.  Skin: Negative for flushing, nail changes, rash and suspicious lesions.  Musculoskeletal: Negative for arthritis, joint pain, muscle cramps, myalgias, neck pain and stiffness.  Gastrointestinal: Negative for abdominal pain, bowel incontinence, diarrhea and excessive appetite.  Genitourinary: Negative for decreased libido, genital sores and incomplete emptying.  Neurological: Negative for brief paralysis, focal weakness, headaches and loss of balance.  Psychiatric/Behavioral: Negative for altered mental status, depression and suicidal ideas.  Allergic/Immunologic: Negative for HIV exposure and persistent infections.    EKGs/Labs/Other Studies Reviewed:    The following studies were reviewed today:   EKG: none today  TTE 08/28/2020 IMPRESSIONS   1. Aorta measurements are stable compared with prior study 09/07/2019.  Aortic dilatation noted. Aneurysm of the aortic root, measuring 51 mm. 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   2. Tricuspid aortic valve (clearly tricuspid on TEE 2016). Mild to moderate eccentric AI related to incomplete leaflet closure from aortic  root aneurysm. The aortic valve is tricuspid. Aortic valve regurgitation is mild to moderate. No aortic stenosis  is present.   3. Left ventricular ejection fraction, by estimation, is 60 to 65%. The left ventricle has normal function. The left ventricle has no regional  wall motion abnormalities. There is mild concentric left ventricular hypertrophy. Left ventricular diastolic parameters are indeterminate.   4. Right ventricular systolic function is normal. The right ventricular size is normal. There is normal pulmonary artery systolic pressure. The estimated right ventricular systolic pressure is 38.7 mmHg.   5. The mitral valve is  grossly normal. No evidence of mitral valve regurgitation. No evidence of mitral stenosis.   6. The inferior vena cava is normal in size with greater than 50% respiratory variability, suggesting right atrial pressure of 3 mmHg.   Comparison(s): No significant change from prior study.   FINDINGS   Left Ventricle: Left ventricular ejection fraction, by estimation, is 60 to 65%. The left ventricle has  normal function. The left ventricle has no regional wall motion abnormalities. The left ventricular internal cavity size was normal in size. There is  mild concentric left ventricular hypertrophy. Left ventricular diastolic parameters are indeterminate.   Right Ventricle: The right ventricular size is normal. No increase in right ventricular wall thickness. Right ventricular systolic function is  normal. There is normal pulmonary artery systolic pressure. The tricuspid regurgitant velocity is 2.70 m/s, and   with an assumed right atrial pressure of 3 mmHg, the estimated right ventricular systolic pressure is 29.4 mmHg.   Left Atrium: Left atrial size was normal in size.   Right Atrium: Right atrial size was normal in size.   Pericardium: Trivial pericardial effusion is present.   Mitral Valve: The mitral valve is grossly normal. Mild mitral annular calcification. No evidence of mitral valve regurgitation. No evidence of  mitral valve stenosis.   Tricuspid Valve: The tricuspid valve is grossly normal. Tricuspid valve regurgitation is mild . No evidence of tricuspid stenosis.   Aortic Valve: Tricuspid aortic valve (clearly tricuspid on TEE 2016). Mild to moderate eccentric AI related to incomplete leaflet closure from aortic root aneurysm. The aortic valve is tricuspid. Aortic valve regurgitation is mild to moderate. Aortic  regurgitation PHT measures 438 msec. No aortic stenosis is present.   Pulmonic Valve: The pulmonic valve was grossly normal. Pulmonic valve regurgitation is not visualized. No  evidence of pulmonic stenosis.   Aorta: Aorta measurements are stable compared with prior study 2/16/202. Aortic dilatation noted. There is an aneurysm involving the aortic root measuring 51 mm. There is an aneurysm involving the ascending aorta measuring 49 mm.   Venous: The inferior vena cava is normal in size with greater than 50% respiratory variability, suggesting right atrial pressure of 3 mmHg.   IAS/Shunts: The atrial septum is grossly normal.   Recent Labs: 07/27/2020: ALT 25 04/27/2021: Platelets 246; TSH 2.760 07/12/2021: Hemoglobin 15.3 07/17/2021: BUN 21; Creatinine, Ser 1.69; Magnesium 2.4; NT-Pro BNP 949; Potassium 4.6; Sodium 145  Recent Lipid Panel    Component Value Date/Time   CHOL 73 (L) 07/27/2020 0954   TRIG 59 07/27/2020 0954   HDL 40 07/27/2020 0954   CHOLHDL 1.8 07/27/2020 0954   CHOLHDL 2.8 08/22/2018 0645   VLDL 12 08/22/2018 0645   LDLCALC 19 07/27/2020 0954    Physical Exam:    VS:  BP 100/62 (BP Location: Left Arm)    Pulse 82    Ht 5\' 6"  (1.676 m)    Wt 148 lb (67.1 kg)    SpO2 98%    BMI 23.89 kg/m     Wt Readings from Last 3 Encounters:  07/19/21 148 lb (67.1 kg)  07/17/21 163 lb 9.6 oz (74.2 kg)  07/12/21 157 lb 6.4 oz (71.4 kg)     GEN: Well nourished, well developed in no acute distress HEENT: Normal NECK: No JVD; No carotid bruits LYMPHATICS: No lymphadenopathy CARDIAC: S1S2 noted,RRR, no murmurs, rubs, gallops RESPIRATORY:  Clear to auscultation without rales, wheezing or rhonchi  ABDOMEN: Soft, non-tender, non-distended, +bowel sounds, no guarding. EXTREMITIES: No edema, No cyanosis, no clubbing MUSCULOSKELETAL:  No deformity  SKIN: Warm and dry NEUROLOGIC:  Alert and oriented x 3, non-focal PSYCHIATRIC:  Normal affect, good insight  ASSESSMENT:    1. Acute diastolic congestive heart failure ([place])   2. Essential hypertension   3. Typical atrial flutter ([place])   4. Coronary artery disease involving native coronary artery of  native heart without angina pectoris  PLAN:    Thankfully has improved significantly with start of diuretics.  We will keep him on twice a day up until Saturday and he was started once daily Lasix 40 mg on Sunday.  He will stay on this until his visit with Dr. Radford Pax and adjustments can be made at that time.  He does have his plain flutter ablation coming up in January with Dr. Curt Bears.  No anginal symptoms.  I reviewed his testing results with him.  Kidney function appears to be stable.  BNP was elevated 949.  The patient is in agreement with the above plan. The patient left the office in stable condition.  The patient will follow up in January as scheduled with Dr. Radford Pax.   Medication Adjustments/Labs and Tests Ordered: Current medicines are reviewed at length with the patient today.  Concerns regarding medicines are outlined above.  No orders of the defined types were placed in this encounter.  No orders of the defined types were placed in this encounter.   Patient Instructions  Medication Instructions:  Your physician recommends that you continue on your current medications as directed. Please refer to the Current Medication list given to you today.  Continue: Lasix 40 mg twice daily until Sunday then start once daily Continue: Amlodipine 5 mg (half tablet) until Sunday then start Amlodipine 10 mg (whole tablet).   *If you need a refill on your cardiac medications before your next appointment, please call your pharmacy*   Lab Work: None If you have labs (blood work) drawn today and your tests are completely normal, you will receive your results only by: [place] (if you have MyChart) OR A paper copy in the mail If you have any lab test that is abnormal or we need to change your treatment, we will call you to review the results.   Testing/Procedures: None   Follow-Up: At [facility], you and your health needs are our priority.  As part of our continuing  mission to provide you with exceptional heart care, we have created designated Provider Care Teams.  These Care Teams include your primary Cardiologist (physician) and Advanced Practice Providers (APPs -  Physician Assistants and Nurse Practitioners) who all work together to provide you with the care you need, when you need it.  We recommend signing up for the patient portal called "MyChart".  Sign up information is provided on this After Visit Summary.  MyChart is used to connect with patients for Virtual Visits (Telemedicine).  Patients are able to view lab/test results, encounter notes, upcoming appointments, etc.  Non-urgent messages can be sent to your provider as well.   To learn more about what you can do with MyChart, go to [email or web address].    Your next appointment:   Jan 7th @ 8:20am   The format for your next appointment:   In Person  Provider:   Fransico Him, MD     Other Instructions     Adopting a Healthy Lifestyle.  Know what a healthy weight is for you (roughly BMI <25) and aim to maintain this   Aim for 7+ servings of fruits and vegetables daily   65-80+ fluid ounces of water or unsweet tea for healthy kidneys   Limit to max 1 drink of alcohol per day; avoid smoking/tobacco   Limit animal fats in diet for cholesterol and heart health - choose grass fed whenever available   Avoid highly processed foods, and foods high in saturated/trans fats   Aim for low stress -  take time to unwind and care for your mental health   Aim for 150 min of moderate intensity exercise weekly for heart health, and weights twice weekly for bone health   Aim for 7-9 hours of sleep daily   When it comes to diets, agreement about the perfect plan isnt easy to find, even among the experts. Experts at the [employer] developed an idea known as the Healthy Eating Plate. Just imagine a plate divided into logical, healthy portions.   The emphasis is on diet  quality:   Load up on vegetables and fruits - one-half of your plate: Aim for color and variety, and remember that potatoes dont count.   Go for whole grains - one-quarter of your plate: Whole wheat, barley, wheat berries, quinoa, oats, brown rice, and foods made with them. If you want pasta, go with whole wheat pasta.   Protein power - one-quarter of your plate: Fish, chicken, beans, and nuts are all healthy, versatile protein sources. Limit red meat.   The diet, however, does go beyond the plate, offering a few other suggestions.   Use healthy plant oils, such as olive, canola, soy, corn, sunflower and peanut. Check the labels, and avoid partially hydrogenated oil, which have unhealthy trans fats.   If youre thirsty, drink water. Coffee and tea are good in moderation, but skip sugary drinks and limit milk and dairy products to one or two daily servings.   The type of carbohydrate in the diet is more important than the amount. Some sources of carbohydrates, such as vegetables, fruits, whole grains, and beans-are healthier than others.   Finally, stay active  Signed, Berniece Salines, DO  07/19/2021 4:36 PM    [facility]

## 2021-07-24 ENCOUNTER — Encounter: Payer: Self-pay | Admitting: Cardiology

## 2021-07-24 ENCOUNTER — Telehealth (HOSPITAL_COMMUNITY): Payer: Self-pay | Admitting: Emergency Medicine

## 2021-07-24 ENCOUNTER — Encounter (HOSPITAL_COMMUNITY): Payer: Self-pay | Admitting: Cardiology

## 2021-07-24 ENCOUNTER — Other Ambulatory Visit: Payer: Self-pay | Admitting: Dermatology

## 2021-07-24 NOTE — Telephone Encounter (Signed)
Reaching out to patient to offer assistance regarding upcoming cardiac imaging study; pt verbalizes understanding of appt date/time, parking situation and where to check in, pre-test NPO status and medications ordered, and verified current allergies; name and call back number provided for further questions should they arise Marchia Bond RN Navigator Cardiac Imaging [facility] [phone] office [phone] cell  Denies iv issues Holding lasix, losartan Taking nebivolol 2 hr prior to scan Arrival 1:00p

## 2021-07-24 NOTE — Telephone Encounter (Signed)
Attempted to call patient regarding upcoming cardiac CT appointment. °Left message on voicemail with name and callback number °Nayellie Sanseverino RN Navigator Cardiac Imaging °[facility] °[phone] Office °[phone] Cell ° °

## 2021-07-25 ENCOUNTER — Other Ambulatory Visit: Payer: Self-pay

## 2021-07-25 ENCOUNTER — Ambulatory Visit (HOSPITAL_COMMUNITY)
Admission: RE | Admit: 2021-07-25 | Discharge: 2021-07-25 | Disposition: A | Discharge status: Home / Self Care | Payer: Medicare Other | Source: Ambulatory Visit | Attending: Cardiology | Admitting: Cardiology

## 2021-07-25 DIAGNOSIS — I4819 Other persistent atrial fibrillation: Secondary | ICD-10-CM

## 2021-07-25 LAB — DERMPATH, SPECIMEN A

## 2021-07-25 LAB — DERMATOPATHOLOGY REPORT

## 2021-07-25 MED ORDER — IOHEXOL 350 MG/ML SOLN
95.0000 mL | Freq: Once | INTRAVENOUS | Status: AC | PRN
  Administered 2021-07-25: 95 mL via INTRAVENOUS

## 2021-08-01 NOTE — Pre-Procedure Instructions (Signed)
Instructed patient on the following items: Arrival time 0530 Nothing to eat or drink after midnight No meds AM of procedure Responsible person to drive you home and stay with you for 24 hrs  Have you missed any doses of anti-coagulant Eliquis- hasn't missed any doses    

## 2021-08-01 NOTE — Anesthesia Preprocedure Evaluation (Addendum)
Anesthesia Evaluation  Patient identified by MRN, date of birth, ID band Patient awake    Reviewed: Allergy & Precautions, H&P , NPO status , Patient's Chart, lab work & pertinent test results  Airway Mallampati: II  TM Distance: >3 FB Neck ROM: Full    Dental  (+) Teeth Intact, Dental Advisory Given, Missing, Partial Upper, Caps   Pulmonary sleep apnea and Continuous Positive Airway Pressure Ventilation , former smoker,    Pulmonary exam normal breath sounds clear to auscultation       Cardiovascular hypertension, Pt. on medications and Pt. on home beta blockers + CAD and + Past MI  + dysrhythmias Atrial Fibrillation + Valvular Problems/Murmurs  Rhythm:Regular Rate:Normal  08/28/20 ECHO:  1. Aorta measurements are stable compared with prior study 09/07/2019.  Aortic dilatation noted. Aneurysm of the aortic root, measuring 51 mm.  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  2. Tricuspid aortic valve (clearly tricuspid on TEE 2016). Mild to  moderate eccentric AI related to incomplete leaflet closure from aortic  root aneurysm. The aortic valve is tricuspid. Aortic valve regurgitation  is mild to moderate. No aortic stenosis  is present.  3. Left ventricular ejection fraction, by estimation, is 60 to 65%. The  left ventricle has normal function. The left ventricle has no regional  wall motion abnormalities. There is mild concentric left ventricular  hypertrophy. Left ventricular diastolic  parameters are indeterminate.  4. Right ventricular systolic function is normal. The right ventricular  size is normal. There is normal pulmonary artery systolic pressure. The  estimated right ventricular systolic pressure is 40.3 mmHg.  5. The mitral valve is grossly normal. No evidence of mitral valve  regurgitation. No evidence of mitral stenosis.  6. The inferior vena cava is normal in size with greater than 50%  respiratory  variability, suggesting right atrial pressure of 3 mmHg.   Comparison(s): No significant change from prior study.    Neuro/Psych  Headaches, PSYCHIATRIC DISORDERS Anxiety Depression Bilateral aids    GI/Hepatic negative GI ROS, Neg liver ROS,   Endo/Other  negative endocrine ROS  Renal/GU Renal disease  negative genitourinary   Musculoskeletal  (+) Arthritis , Osteoarthritis,    Abdominal   Peds  Hematology negative hematology ROS (+)   Anesthesia Other Findings   Reproductive/Obstetrics                          Anesthesia Physical Anesthesia Plan  ASA: 3  Anesthesia Plan: General   Post-op Pain Management: Minimal or no pain anticipated   Induction: Intravenous  PONV Risk Score and Plan: 2 and Ondansetron, Dexamethasone and Treatment may vary due to age or medical condition  Airway Management Planned: Oral ETT  Additional Equipment: None  Intra-op Plan:   Post-operative Plan: Extubation in OR  Informed Consent: I have reviewed the patients History and Physical, chart, labs and discussed the procedure including the risks, benefits and alternatives for the proposed anesthesia with the patient or authorized representative who has indicated his/her understanding and acceptance.     Dental advisory given  Plan Discussed with: Anesthesiologist and CRNA  Anesthesia Plan Comments:        Anesthesia Quick Evaluation

## 2021-08-02 ENCOUNTER — Ambulatory Visit (HOSPITAL_COMMUNITY): Payer: Medicare Other | Admitting: Anesthesiology

## 2021-08-02 ENCOUNTER — Ambulatory Visit (HOSPITAL_COMMUNITY)
Admission: RE | Admit: 2021-08-02 | Discharge: 2021-08-02 | Disposition: A | Discharge status: Home / Self Care | Payer: Medicare Other | Attending: Cardiology | Admitting: Cardiology

## 2021-08-02 ENCOUNTER — Encounter (HOSPITAL_COMMUNITY)
Admission: RE | Disposition: A | Discharge status: Home / Self Care | Payer: Self-pay | Source: Home / Self Care | Attending: Cardiology

## 2021-08-02 ENCOUNTER — Other Ambulatory Visit: Payer: Self-pay

## 2021-08-02 ENCOUNTER — Ambulatory Visit: Payer: Medicare Other | Admitting: Adult Health

## 2021-08-02 DIAGNOSIS — N183 Chronic kidney disease, stage 3 unspecified: Secondary | ICD-10-CM | POA: Diagnosis not present

## 2021-08-02 DIAGNOSIS — Z951 Presence of aortocoronary bypass graft: Secondary | ICD-10-CM | POA: Diagnosis not present

## 2021-08-02 DIAGNOSIS — Z7901 Long term (current) use of anticoagulants: Secondary | ICD-10-CM | POA: Insufficient documentation

## 2021-08-02 DIAGNOSIS — I4819 Other persistent atrial fibrillation: Secondary | ICD-10-CM | POA: Diagnosis present

## 2021-08-02 DIAGNOSIS — I483 Typical atrial flutter: Secondary | ICD-10-CM | POA: Diagnosis not present

## 2021-08-02 DIAGNOSIS — I129 Hypertensive chronic kidney disease with stage 1 through stage 4 chronic kidney disease, or unspecified chronic kidney disease: Secondary | ICD-10-CM | POA: Diagnosis not present

## 2021-08-02 DIAGNOSIS — G4733 Obstructive sleep apnea (adult) (pediatric): Secondary | ICD-10-CM | POA: Diagnosis not present

## 2021-08-02 DIAGNOSIS — I2510 Atherosclerotic heart disease of native coronary artery without angina pectoris: Secondary | ICD-10-CM | POA: Insufficient documentation

## 2021-08-02 HISTORY — PX: ATRIAL FIBRILLATION ABLATION: EP1191

## 2021-08-02 LAB — CBC
HCT: 40.5 % (ref 39.0–52.0)
Hemoglobin: 13.4 g/dL (ref 13.0–17.0)
MCH: 34.6 pg — ABNORMAL HIGH (ref 26.0–34.0)
MCHC: 33.1 g/dL (ref 30.0–36.0)
MCV: 104.7 fL — ABNORMAL HIGH (ref 80.0–100.0)
Platelets: 197 10*3/uL (ref 150–400)
RBC: 3.87 MIL/uL — ABNORMAL LOW (ref 4.22–5.81)
RDW: 12.3 % (ref 11.5–15.5)
WBC: 9.7 10*3/uL (ref 4.0–10.5)
nRBC: 0 % (ref 0.0–0.2)

## 2021-08-02 LAB — POCT ACTIVATED CLOTTING TIME
Activated Clotting Time: 293 seconds
Activated Clotting Time: 323 seconds
Activated Clotting Time: 329 seconds

## 2021-08-02 SURGERY — ATRIAL FIBRILLATION ABLATION
Anesthesia: General

## 2021-08-02 MED ORDER — FENTANYL CITRATE (PF) 250 MCG/5ML IJ SOLN
INTRAMUSCULAR | Status: DC | PRN
  Administered 2021-08-02: 100 ug via INTRAVENOUS

## 2021-08-02 MED ORDER — DEXAMETHASONE SODIUM PHOSPHATE 10 MG/ML IJ SOLN
INTRAMUSCULAR | Status: DC | PRN
  Administered 2021-08-02: 10 mg via INTRAVENOUS

## 2021-08-02 MED ORDER — PHENYLEPHRINE HCL-NACL 20-0.9 MG/250ML-% IV SOLN
INTRAVENOUS | Status: DC | PRN
  Administered 2021-08-02: 35 ug/min via INTRAVENOUS

## 2021-08-02 MED ORDER — SUGAMMADEX SODIUM 200 MG/2ML IV SOLN
INTRAVENOUS | Status: DC | PRN
  Administered 2021-08-02: 200 mg via INTRAVENOUS

## 2021-08-02 MED ORDER — PROTAMINE SULFATE 10 MG/ML IV SOLN
INTRAVENOUS | Status: DC | PRN
  Administered 2021-08-02: 10 mg via INTRAVENOUS
  Administered 2021-08-02: 30 mg via INTRAVENOUS

## 2021-08-02 MED ORDER — SODIUM CHLORIDE 0.9 % IV SOLN: INTRAVENOUS | Status: DC

## 2021-08-02 MED ORDER — LIDOCAINE 2% (20 MG/ML) 5 ML SYRINGE
INTRAMUSCULAR | Status: DC | PRN
  Administered 2021-08-02: 40 mg via INTRAVENOUS

## 2021-08-02 MED ORDER — HEPARIN SODIUM (PORCINE) 1000 UNIT/ML IJ SOLN
INTRAMUSCULAR | Status: DC | PRN
  Administered 2021-08-02: 1000 [IU] via INTRAVENOUS

## 2021-08-02 MED ORDER — HEPARIN SODIUM (PORCINE) 1000 UNIT/ML IJ SOLN
INTRAMUSCULAR | Status: AC
  Filled 2021-08-02: qty 10

## 2021-08-02 MED ORDER — SODIUM CHLORIDE 0.9% FLUSH: 3.0000 mL | Freq: Two times a day (BID) | INTRAVENOUS | Status: DC

## 2021-08-02 MED ORDER — SODIUM CHLORIDE 0.9% FLUSH: 3.0000 mL | INTRAVENOUS | Status: DC | PRN

## 2021-08-02 MED ORDER — ACETAMINOPHEN 325 MG PO TABS
650.0000 mg | ORAL_TABLET | ORAL | Status: DC | PRN
  Filled 2021-08-02: qty 2

## 2021-08-02 MED ORDER — HEPARIN (PORCINE) IN NACL 1000-0.9 UT/500ML-% IV SOLN
INTRAVENOUS | Status: DC | PRN
  Administered 2021-08-02 (×5): 500 mL

## 2021-08-02 MED ORDER — ONDANSETRON HCL 4 MG/2ML IJ SOLN: 4.0000 mg | Freq: Four times a day (QID) | INTRAMUSCULAR | Status: DC | PRN

## 2021-08-02 MED ORDER — MIDAZOLAM HCL 2 MG/2ML IJ SOLN
INTRAMUSCULAR | Status: DC | PRN
  Administered 2021-08-02: 2 mg via INTRAVENOUS

## 2021-08-02 MED ORDER — DOBUTAMINE INFUSION FOR EP/ECHO/NUC (1000 MCG/ML)
INTRAVENOUS | Status: DC | PRN
  Administered 2021-08-02: 20 ug/kg/min via INTRAVENOUS

## 2021-08-02 MED ORDER — ROCURONIUM BROMIDE 10 MG/ML (PF) SYRINGE
PREFILLED_SYRINGE | INTRAVENOUS | Status: DC | PRN
  Administered 2021-08-02: 70 mg via INTRAVENOUS

## 2021-08-02 MED ORDER — HEPARIN SODIUM (PORCINE) 1000 UNIT/ML IJ SOLN
INTRAMUSCULAR | Status: DC | PRN
  Administered 2021-08-02: 4000 [IU] via INTRAVENOUS
  Administered 2021-08-02: 14000 [IU] via INTRAVENOUS
  Administered 2021-08-02: 1000 [IU] via INTRAVENOUS

## 2021-08-02 MED ORDER — PROPOFOL 10 MG/ML IV BOLUS
INTRAVENOUS | Status: DC | PRN
Start: 2021-08-02 — End: 2021-08-02
  Administered 2021-08-02: 170 mg via INTRAVENOUS

## 2021-08-02 MED ORDER — SODIUM CHLORIDE 0.9 % IV SOLN: 250.0000 mL | INTRAVENOUS | Status: DC | PRN

## 2021-08-02 MED ORDER — ONDANSETRON HCL 4 MG/2ML IJ SOLN
INTRAMUSCULAR | Status: DC | PRN
  Administered 2021-08-02: 4 mg via INTRAVENOUS

## 2021-08-02 SURGICAL SUPPLY — 18 items
CATH OCTARAY 2.0 F 3-3-3-3-3 (CATHETERS) ×1 IMPLANT
CATH S CIRCA THERM PROBE 10F (CATHETERS) ×1 IMPLANT
CATH SMTCH THERMOCOOL SF DF (CATHETERS) ×1 IMPLANT
CATH SOUNDSTAR ECO 8FR (CATHETERS) ×1 IMPLANT
CATH WEB BI DIR CSDF CRV REPRO (CATHETERS) ×1 IMPLANT
CLOSURE PERCLOSE PROSTYLE (VASCULAR PRODUCTS) ×4 IMPLANT
COVER SWIFTLINK CONNECTOR (BAG) ×2 IMPLANT
KIT CATH VERSACROSS STEERABLE (CATHETERS) ×1 IMPLANT
PACK EP LATEX FREE (CUSTOM PROCEDURE TRAY) ×2
PACK EP LF (CUSTOM PROCEDURE TRAY) ×1 IMPLANT
PAD DEFIB RADIO PHYSIO CONN (PAD) ×2 IMPLANT
PATCH CARTO3 (PAD) ×1 IMPLANT
SHEATH CARTO VIZIGO SM CVD (SHEATH) ×1 IMPLANT
SHEATH PINNACLE 7F 10CM (SHEATH) ×1 IMPLANT
SHEATH PINNACLE 8F 10CM (SHEATH) ×2 IMPLANT
SHEATH PINNACLE 9F 10CM (SHEATH) ×1 IMPLANT
SHEATH PROBE COVER 6X72 (BAG) ×1 IMPLANT
TUBING SMART ABLATE COOLFLOW (TUBING) ×1 IMPLANT

## 2021-08-02 NOTE — Anesthesia Procedure Notes (Signed)
Procedure Name: Intubation Date/Time: 08/02/2021 7:45 AM Performed by: Lavell Luster, CRNA Pre-anesthesia Checklist: Patient identified, Emergency Drugs available, Suction available, Patient being monitored and Timeout performed Patient Re-evaluated:Patient Re-evaluated prior to induction Oxygen Delivery Method: Circle system utilized Preoxygenation: Pre-oxygenation with 100% oxygen Induction Type: IV induction Ventilation: Mask ventilation without difficulty Laryngoscope Size: Mac and 3 Grade View: Grade I Tube type: Oral Tube size: 7.5 mm Number of attempts: 1 Airway Equipment and Method: Stylet Placement Confirmation: ETT inserted through vocal cords under direct vision, positive ETCO2 and breath sounds checked- equal and bilateral Secured at: 22 cm Tube secured with: Tape Dental Injury: Teeth and Oropharynx as per pre-operative assessment

## 2021-08-02 NOTE — H&P (Signed)
Electrophysiology Office Note   Date:  08/02/2021   ID:  [name], DOB November 05, 1945, MRN [id number]  PCP:  Burman Freestone, MD  Cardiologist: Radford Pax Primary Electrophysiologist:  Dekker Verga Meredith Leeds, MD    Chief Complaint: Atrial flutter   History of Present Illness: [name] is a 76 y.o. male who is being seen today for the evaluation of atrial flutter at the request of No ref. provider found. Presenting today for electrophysiology evaluation.  He has a history significant coronary artery disease status post CABG 1998, bicuspid aortic valve with moderate to severe AI, dilated aortic root, hypertension, hyperlipidemia, OSA, atrial flutter.  He was having increasing palpitations.  30-day monitor showed episodes of atrial flutter with heart rates in the 120s.  He is now status post atrial flutter ablation on 09/29/2019.  Unfortunately, he came back to cardiology clinic in atrial fibrillation.  He had a cardioversion 05/21/2021 but unfortunately went back into atrial fibrillation on 05/27/2021.  He cannot identify any specific triggers.  Today, denies symptoms of palpitations, chest pain, shortness of breath, orthopnea, PND, lower extremity edema, claudication, dizziness, presyncope, syncope, bleeding, or neurologic sequela. The patient is tolerating medications without difficulties. Plan ablation today for AF.    Past Medical History:  Diagnosis Date   Anal fissure    Aortic insufficiency    Arthritis    Atrial flutter (HCC)    Bicuspid aortic valve    with mild to moderate AR by echo 08/2019   BPH (benign prostatic hyperplasia)    CAD (coronary artery disease)    s/p CABG [place] [place]   Cataract    bilateral-removed   CKD (chronic kidney disease), stage III ([place])    Depression    Dilated aortic root (HCC)    History of blood transfusion 1998   "due to cardiac bypass surgery"   Hyperlipidemia    Hypertension    Hypogonadism in male    Kidney stones     Migraine    Mild carotid artery disease ([place]) 2009   Myocardial infarction ([place])    PVC's (premature ventricular contractions)    Retinal hemorrhage 11/04/2014   Sleep apnea with use of continuous positive airway pressure (CPAP)    Thoracic aortic aneurysm (TAA)    Past Surgical History:  Procedure Laterality Date   A-FLUTTER ABLATION N/A 09/29/2019   Procedure: A-FLUTTER ABLATION;  Surgeon: Constance Haw, MD;  Location: [facility];  Service: Cardiovascular;  Laterality: N/A;   CARDIOVERSION N/A 08/10/2019   Procedure: CARDIOVERSION;  Surgeon: Elouise Munroe, MD;  Location: [facility] ENDOSCOPY;  Service: Cardiovascular;  Laterality: N/A;   CARDIOVERSION N/A 05/21/2021   Procedure: CARDIOVERSION;  Surgeon: Josue Hector, MD;  Location: [facility] ENDOSCOPY;  Service: Cardiovascular;  Laterality: N/A;   CARDIOVERSION N/A 07/12/2021   Procedure: CARDIOVERSION;  Surgeon: Jerline Pain, MD;  Location: [facility] ENDOSCOPY;  Service: Cardiovascular;  Laterality: N/A;   cataracts     COLONOSCOPY     CORONARY ARTERY BYPASS GRAFT  1998   LEFT HEART CATH AND CORONARY ANGIOGRAPHY N/A 08/24/2018   Procedure: LEFT HEART CATH AND CORONARY ANGIOGRAPHY;  Surgeon: Lorretta Harp, MD;  Location: [facility];  Service: Cardiovascular;  Laterality: N/A;   TEE WITHOUT CARDIOVERSION N/A 01/19/2015   Procedure: TRANSESOPHAGEAL ECHOCARDIOGRAM (TEE);  Surgeon: Sueanne Margarita, MD;  Location: [facility] ENDOSCOPY;  Service: Cardiovascular;  Laterality: N/A;   [place]   pt denies  Current Facility-Administered Medications  Medication Dose Route Frequency Provider Last Rate Last Admin   0.9 %  sodium chloride infusion   Intravenous Continuous Constance Haw, MD 50 mL/hr at 08/02/21 0628 New Bag at 08/02/21 0628    Allergies:   Dog epithelium allergy skin test and Mushroom extract complex   Social History:  The patient  reports that he quit smoking about 35 years ago. His smoking use  included cigarettes. He has never used smokeless tobacco. He reports current alcohol use of about 1.0 standard drink per week. He reports that he does not use drugs.   Family History:  The patient's family history includes Arthritis in his father and mother; Diabetes in his father and mother; Heart disease (age of onset: 82) in his father; Hyperlipidemia in his father and mother; Hypertension in his father; Kidney disease in his paternal grandfather; Stroke (age of onset: 82) in his father.   ROS:  Please see the history of present illness.   Otherwise, review of systems is positive for none.   All other systems are reviewed and negative.   PHYSICAL EXAM: VS:  BP 121/65    Pulse (!) 54    Temp 98.1 F (36.7 C) (Oral)    Ht 5\' 7"  (1.702 m)    Wt 67.1 kg    SpO2 99%    BMI 23.18 kg/m  , BMI Body mass index is 23.18 kg/m. GEN: Well nourished, well developed, in no acute distress  HEENT: normal  Neck: no JVD, carotid bruits, or masses Cardiac: irregular; no murmurs, rubs, or gallops,no edema  Respiratory:  clear to auscultation bilaterally, normal work of breathing GI: soft, nontender, nondistended, + BS MS: no deformity or atrophy  Skin: warm and dry Neuro:  Strength and sensation are intact Psych: euthymic mood, full affect   Recent Labs: 04/27/2021: TSH 2.760 07/17/2021: BUN 21; Creatinine, Ser 1.69; Magnesium 2.4; NT-Pro BNP 949; Potassium 4.6; Sodium 145 08/02/2021: Hemoglobin 13.4; Platelets 197    Lipid Panel     Component Value Date/Time   CHOL 73 (L) 07/27/2020 0954   TRIG 59 07/27/2020 0954   HDL 40 07/27/2020 0954   CHOLHDL 1.8 07/27/2020 0954   CHOLHDL 2.8 08/22/2018 0645   VLDL 12 08/22/2018 0645   LDLCALC 19 07/27/2020 0954     Wt Readings from Last 3 Encounters:  08/02/21 67.1 kg  07/19/21 67.1 kg  07/17/21 74.2 kg      Other studies Reviewed: Additional studies/ records that were reviewed today include: TTE 08/22/18  Review of the above records today  demonstrates:   1. The left ventricle has normal systolic function of 59-93%. The cavity size is normal. There is mild concentric left ventricular hypertrophy. Echo evidence of normal diastolic filling patterns. Normal left ventricular filling pressures.  2. Normal left atrial size.  3. Normal right atrial size.  4. The mitral valve normal in structure. There is mild mitral annular calcification present. Regurgitation is trivial by color flow Doppler . No evidence of mitral valve stenosis.  5. Normal tricuspid valve.  6. The aortic valve possibly bicuspid. Aortic valve regurgitation is mild to moderate by color flow Doppler. The jet is eccentric posteriorly directed.  7. Moderate dilatation of the aortic root.  8. No atrial level shunt detected by color flow Doppler.  9. Consider further evaluation of the ascending aorta with CT angiography.  LHC 08/24/18  Ost LAD lesion is 100% stenosed. Ost RCA to Prox RCA lesion is 100% stenosed. Origin  lesion is 100% stenosed. Prox Cx lesion is 50% stenosed.  Monitor 08/04/18 personally reviewed Sinus bradycardia, normal sinus rhythm and sinus tachycardia. The average heart rate was 67bpm. The heart rate ranged from 43 to 135bpm. Frequent PVCs single, bigeminal and trigeminal and nonsustained ventricular tachycardia up to 4 beats. Paroxysmal atrial flutter < 1% burden.  ASSESSMENT AND PLAN:  1.  Typical atrial flutter: CHA2DS2-VASc of 3.  Status post ablation 09/29/2019.  Amiodarone and Eliquis were stopped at the last visit.  He has had no further episodes.  2.  Coronary artery disease: No current angina  3.  Hypertension: Currently well controlled  4.  Obstructive sleep apnea: CPAP compliance encouraged  5.  Persistent atrial fibrillation: [name] has presented today for surgery, with the diagnosis of atrial fibrillation.  The various methods of treatment have been discussed with the patient and family. After consideration of risks, benefits  and other options for treatment, the patient has consented to  Procedure(s): Catheter ablation as a surgical intervention .  Risks include but not limited to complete heart block, stroke, esophageal damage, nerve damage, bleeding, vascular damage, tamponade, perforation, MI, and death. The patient's history has been reviewed, patient examined, no change in status, stable for surgery.  I have reviewed the patient's chart and labs.  Questions were answered to the patient's satisfaction.    Drew Rohe Curt Bears, MD 08/02/2021 7:11 AM

## 2021-08-02 NOTE — Anesthesia Postprocedure Evaluation (Signed)
Anesthesia Post Note  Patient: [name]  Procedure(s) Performed: ATRIAL FIBRILLATION ABLATION     Patient location during evaluation: PACU Anesthesia Type: General Level of consciousness: awake Pain management: pain level controlled Vital Signs Assessment: post-procedure vital signs reviewed and stable Respiratory status: spontaneous breathing and respiratory function stable Cardiovascular status: stable Postop Assessment: no apparent nausea or vomiting Anesthetic complications: no   There were no known notable events for this encounter.  Last Vitals:  Vitals:   08/02/21 1115 08/02/21 1130  BP: (!) 113/45 (!) 114/49  Pulse: (!) 58 (!) 58  Resp: 12 15  Temp:    SpO2: 96% 96%    Last Pain:  Vitals:   08/02/21 1040  TempSrc:   PainSc: 0-No pain                 Merlinda Frederick

## 2021-08-02 NOTE — Transfer of Care (Signed)
Immediate Anesthesia Transfer of Care Note  Patient: [name]  Procedure(s) Performed: ATRIAL FIBRILLATION ABLATION  Patient Location: Cath Lab  Anesthesia Type:General  Level of Consciousness: awake, alert  and oriented  Airway & Oxygen Therapy: Patient connected to face mask oxygen  Post-op Assessment: Post -op Vital signs reviewed and stable  Post vital signs: stable  Last Vitals:  Vitals Value Taken Time  BP 110/52 08/02/21 1001  Temp    Pulse 58 08/02/21 1002  Resp 13 08/02/21 1002  SpO2 95 % 08/02/21 1002  Vitals shown include unvalidated device data.  Last Pain:  Vitals:   08/02/21 0623  TempSrc:   PainSc: 0-No pain         Complications: There were no known notable events for this encounter.

## 2021-08-02 NOTE — Discharge Instructions (Signed)

## 2021-08-03 ENCOUNTER — Encounter (HOSPITAL_COMMUNITY): Payer: Self-pay | Admitting: Cardiology

## 2021-08-08 ENCOUNTER — Encounter: Payer: Self-pay | Admitting: Cardiology

## 2021-08-08 ENCOUNTER — Encounter: Payer: Self-pay | Admitting: Adult Health

## 2021-08-13 ENCOUNTER — Encounter: Payer: Self-pay | Admitting: Adult Health

## 2021-08-13 MED ORDER — TRAZODONE HCL 50 MG PO TABS: 75.0000 mg | ORAL_TABLET | Freq: Every day | ORAL | 3 refills | Status: DC

## 2021-08-13 NOTE — Telephone Encounter (Signed)
90 day refill is ok

## 2021-08-20 ENCOUNTER — Encounter: Payer: Self-pay | Admitting: Cardiology

## 2021-08-23 ENCOUNTER — Other Ambulatory Visit: Payer: Self-pay

## 2021-08-23 ENCOUNTER — Ambulatory Visit (HOSPITAL_COMMUNITY): Payer: Medicare Other | Attending: Cardiology

## 2021-08-23 DIAGNOSIS — I7121 Aneurysm of the ascending aorta, without rupture: Secondary | ICD-10-CM | POA: Diagnosis present

## 2021-08-23 DIAGNOSIS — Q231 Congenital insufficiency of aortic valve: Secondary | ICD-10-CM | POA: Diagnosis present

## 2021-08-23 LAB — ECHOCARDIOGRAM COMPLETE
Area-P 1/2: 4.29 cm2
P 1/2 time: 797 msec
S' Lateral: 2.2 cm

## 2021-08-28 ENCOUNTER — Encounter: Payer: Self-pay | Admitting: Cardiology

## 2021-08-28 ENCOUNTER — Ambulatory Visit (INDEPENDENT_AMBULATORY_CARE_PROVIDER_SITE_OTHER): Payer: Medicare Other | Admitting: Cardiology

## 2021-08-28 ENCOUNTER — Other Ambulatory Visit: Payer: Self-pay

## 2021-08-28 VITALS — BP 104/52 | HR 67 | Ht 67.0 in | Wt 139.8 lb

## 2021-08-28 DIAGNOSIS — E7800 Pure hypercholesterolemia, unspecified: Secondary | ICD-10-CM

## 2021-08-28 DIAGNOSIS — I483 Typical atrial flutter: Secondary | ICD-10-CM

## 2021-08-28 DIAGNOSIS — I2510 Atherosclerotic heart disease of native coronary artery without angina pectoris: Secondary | ICD-10-CM

## 2021-08-28 DIAGNOSIS — I10 Essential (primary) hypertension: Secondary | ICD-10-CM

## 2021-08-28 DIAGNOSIS — I7121 Aneurysm of the ascending aorta, without rupture: Secondary | ICD-10-CM | POA: Diagnosis not present

## 2021-08-28 DIAGNOSIS — I351 Nonrheumatic aortic (valve) insufficiency: Secondary | ICD-10-CM | POA: Diagnosis not present

## 2021-08-28 DIAGNOSIS — I5032 Chronic diastolic (congestive) heart failure: Secondary | ICD-10-CM

## 2021-08-28 LAB — LIPID PANEL
Chol/HDL Ratio: 1.9 ratio (ref 0.0–5.0)
Cholesterol, Total: 86 mg/dL — ABNORMAL LOW (ref 100–199)
HDL: 46 mg/dL (ref >39)
LDL Chol Calc (NIH): 23 mg/dL (ref 0–99)
Triglycerides: 83 mg/dL (ref 0–149)
VLDL Cholesterol Cal: 17 mg/dL (ref 5–40)

## 2021-08-28 LAB — COMPREHENSIVE METABOLIC PANEL
ALT: 25 IU/L (ref 0–44)
AST: 34 IU/L (ref 0–40)
Albumin/Globulin Ratio: 2 (ref 1.2–2.2)
Albumin: 4.1 g/dL (ref 3.7–4.7)
Alkaline Phosphatase: 123 IU/L — ABNORMAL HIGH (ref 44–121)
BUN/Creatinine Ratio: 14 (ref 10–24)
BUN: 26 mg/dL (ref 8–27)
Bilirubin Total: 0.2 mg/dL (ref 0.0–1.2)
CO2: 27 mmol/L (ref 20–29)
Calcium: 8.9 mg/dL (ref 8.6–10.2)
Chloride: 103 mmol/L (ref 96–106)
Creatinine, Ser: 1.85 mg/dL — ABNORMAL HIGH (ref 0.76–1.27)
Globulin, Total: 2.1 g/dL (ref 1.5–4.5)
Glucose: 125 mg/dL — ABNORMAL HIGH (ref 70–99)
Potassium: 4.2 mmol/L (ref 3.5–5.2)
Sodium: 140 mmol/L (ref 134–144)
Total Protein: 6.2 g/dL (ref 6.0–8.5)
eGFR: 38 mL/min/{1.73_m2} — ABNORMAL LOW (ref >59)

## 2021-08-28 MED ORDER — NEBIVOLOL HCL 20 MG PO TABS: 20.0000 mg | ORAL_TABLET | Freq: Every day | ORAL | 3 refills | Status: DC

## 2021-08-28 MED ORDER — AMLODIPINE BESYLATE 5 MG PO TABS: 7.5000 mg | ORAL_TABLET | Freq: Every day | ORAL | 3 refills | Status: DC

## 2021-08-28 NOTE — Patient Instructions (Signed)
CHECK YOUR BLOOD PRESSURE DAILY FOR ONE WEEK AND CALL us WITH A LIST OF YOUR READINGS  Medication Instructions:  Your physician has recommended you make the following change in your medication:  1) DECREASE amlodipine (Norvasc) to 7.5 mg daily   *If you need a refill on your cardiac medications before your next appointment, please call your pharmacy*   Lab Work: TODAY: CMET and FLP If you have labs (blood work) drawn today and your tests are completely normal, you will receive your results only by: [place] (if you have MyChart) OR A paper copy in the mail If you have any lab test that is abnormal or we need to change your treatment, we will call you to review the results.   Testing/Procedures: Your physician has requested that you have an echocardiogram in February 2024. Echocardiography is a painless test that uses sound waves to create images of your heart. It provides your doctor with information about the size and shape of your heart and how well your hearts chambers and valves are working. This procedure takes approximately one hour. There are no restrictions for this procedure.  Follow-Up: At [facility], you and your health needs are our priority.  As part of our continuing mission to provide you with exceptional heart care, we have created designated Provider Care Teams.  These Care Teams include your primary Cardiologist (physician) and Advanced Practice Providers (APPs -  Physician Assistants and Nurse Practitioners) who all work together to provide you with the care you need, when you need it.  Your next appointment:   6 month(s)  The format for your next appointment:   In Person  Provider:   Fransico Him, MD    Other Instructions You have been referred to see Dr. Cyndia Bent

## 2021-08-28 NOTE — Progress Notes (Signed)
Cardiology Office Note    Date:  08/28/2021   ID:  [name], DOB July 21, 1946, MRN [id number]  PCP:  Burman Freestone, MD  Cardiologist:  Fransico Him, MD  Electrophysiologist:  Constance Haw, MD   Chief Complaint: CAD, atrial flutter, AI, aortic aneurysm, HLD, HTN  History of Present Illness:   [name] is a 76 y.o. male with history of CAD s/p CABG 1998, bicuspid aortic valve with aortic insufficiency, dilated ascending aorta and aortic root (4.7cm TAA on CT chest 05/2019), hyperlipidemia, hypertension, OSA (followed by neurology), mild carotid artery disease (by duplex 2009), CKD stage III reportedly due to hypertensive nephrosclerosis, atrial flutter, PVCs, hypogonadism, retinal hemorrhage who presents for follow-up.   He originally had bypass surgery in [place]. He has a known bicuspid AV and dilated aortic root followed at [facility]. He was admitted to [facility] in early 2020 with NSTEMI. Cath 08/2018 showed an occluded left radial graft to the RCA with patent LIMA-LAD with L-R collaterals. Medical therapy was recommended.   He was having increased palpitations December 2020 with 30-day monitor showing episodes of atrial flutter in the 120s. He was started on anticoagulation. Repeat echo 08/2019 showed EF 55-60%, mild asymmetric LVH, normal PASP, mild MR, mild-moderate AI, ascending aortic aneurysm of 43mm with aneurysm of aortic root measuring 65mm. He was seen by atrial fib clinic and placed on amiodarone and underwent cardioversion. He subsequently saw Dr. Curt Bears and underwent ablation of his atrial flutter in 09/2019.  CT chest in 05/2019 also demonstrated nonspecific interstitial lung disease.   He was recently seen 12/22 by one of my colleagues for acute CHF exacerbation.  He complained of lower extremity edema and shortness of breath and gained 7 pounds.  He was started on Lasix 40 mg daily which he responded well to.  On 08/02/2021 he underwent atrial flutter  ablation by Dr. Curt Bears.  He is here today for followup and is doing well.  He denies any chest pain or pressure, SOB, DOE, PND, orthopnea, LE edema, dizziness, palpitations or syncope.  He is compliant with his meds and is tolerating meds with no SE.     Past Medical History:  Diagnosis Date   Anal fissure    Aortic insufficiency    Arthritis    Atrial flutter (HCC)    Bicuspid aortic valve    with mild to moderate AR by echo 08/2019   BPH (benign prostatic hyperplasia)    CAD (coronary artery disease)    s/p CABG [place]   Cataract    bilateral-removed   CKD (chronic kidney disease), stage III ([place])    Depression    Dilated aortic root (HCC)    History of blood transfusion 1998   "due to cardiac bypass surgery"   Hyperlipidemia    Hypertension    Hypogonadism in male    Kidney stones    Migraine    Mild carotid artery disease ([place]) 2009   Myocardial infarction ([place])    PVC's (premature ventricular contractions)    Retinal hemorrhage 11/04/2014   Sleep apnea with use of continuous positive airway pressure (CPAP)    Thoracic aortic aneurysm (TAA)     Past Surgical History:  Procedure Laterality Date   A-FLUTTER ABLATION N/A 09/29/2019   Procedure: A-FLUTTER ABLATION;  Surgeon: Constance Haw, MD;  Location: [place] CV [facility];  Service: Cardiovascular;  Laterality: N/A;   ATRIAL FIBRILLATION ABLATION N/A 08/02/2021   Procedure: ATRIAL FIBRILLATION ABLATION;  Surgeon: Constance Haw, MD;  Location: [place] [facility];  Service: Cardiovascular;  Laterality: N/A;   CARDIOVERSION N/A 08/10/2019   Procedure: CARDIOVERSION;  Surgeon: Elouise Munroe, MD;  Location: [facility] ENDOSCOPY;  Service: Cardiovascular;  Laterality: N/A;   CARDIOVERSION N/A 05/21/2021   Procedure: CARDIOVERSION;  Surgeon: Josue Hector, MD;  Location: [facility] ENDOSCOPY;  Service: Cardiovascular;  Laterality: N/A;   CARDIOVERSION N/A 07/12/2021   Procedure: CARDIOVERSION;  Surgeon: Jerline Pain, MD;  Location: [facility] ENDOSCOPY;  Service: Cardiovascular;  Laterality: N/A;   cataracts     COLONOSCOPY     CORONARY ARTERY BYPASS GRAFT  1998   LEFT HEART CATH AND CORONARY ANGIOGRAPHY N/A 08/24/2018   Procedure: LEFT HEART CATH AND CORONARY ANGIOGRAPHY;  Surgeon: Lorretta Harp, MD;  Location: [place] [facility];  Service: Cardiovascular;  Laterality: N/A;   TEE WITHOUT CARDIOVERSION N/A 01/19/2015   Procedure: TRANSESOPHAGEAL ECHOCARDIOGRAM (TEE);  Surgeon: Sueanne Margarita, MD;  Location: [facility] ENDOSCOPY;  Service: Cardiovascular;  Laterality: N/A;   [place]   pt denies    Current Medications: Current Meds  Medication Sig   acetaminophen (TYLENOL) 325 MG tablet Take 650 mg by mouth every 6 (six) hours as needed for mild pain or moderate pain.   ALPRAZolam (ALPRAZOLAM XR) 1 MG 24 hr tablet Take 1 tablet (1 mg total) by mouth daily.   amLODipine (NORVASC) 10 MG tablet Take 1 tablet (10 mg total) by mouth daily.   amoxicillin (AMOXIL) 500 MG capsule Take 4 tablets, 2 grams, 1 hour before each of your three dental procedures.   Ascorbic Acid (VITAMIN C) 1000 MG tablet Take 1,000 mg by mouth 2 (two) times daily.   aspirin EC 81 MG tablet Take 1 tablet (81 mg total) by mouth daily.   aspirin-acetaminophen-caffeine (EXCEDRIN EXTRA STRENGTH) 250-250-65 MG tablet Take 2 tablets by mouth daily as needed for headache.    butalbital-aspirin-caffeine-codeine (FIORINAL WITH CODEINE) 50-325-40-30 MG capsule TAKE ONE CAPSULE BY MOUTH EVERY 4 HOURS AS NEEDED FOR MIGRAINE   diazepam (VALIUM) 5 MG tablet Take 2.5-5 mg by mouth 2 (two) times daily as needed for anxiety.    ibuprofen (ADVIL,MOTRIN) 200 MG tablet Take 200-400 mg by mouth 2 (two) times daily as needed (for arthritis in the feet).    losartan (COZAAR) 25 MG tablet Take 1 tablet (25 mg total) by mouth daily.   metoprolol succinate (TOPROL-XL) 100 MG 24 hr tablet Take 1 tablet (100 mg total) by mouth daily. Take with or  immediately following a meal.   metoprolol tartrate (LOPRESSOR) 25 MG tablet Take 1 tablet (25 mg total) by mouth every 6 (six) hours as needed (Heart Rate >100).   niacin 500 MG tablet Take 500 mg by mouth at bedtime.   rosuvastatin (CRESTOR) 20 MG tablet TAKE ONE TABLET BY MOUTH DAILY   Specialty Vitamins Products (CENTRUM SPECIALIST ENERGY) TABS Take 1 tablet by mouth daily with breakfast.   tadalafil (CIALIS) 5 MG tablet Take 5 mg by mouth daily.   Testosterone (ANDROGEL) 40.5 MG/2.5GM (1.62%) GEL Place 1 application onto the skin daily. Apply 1 pump to each shoulder every morning   Turmeric 500 MG CAPS Take 1,000 mg by mouth daily.    vitamin E 400 UNIT capsule Take 400 Units by mouth at bedtime.    zonisamide (ZONEGRAN) 25 MG capsule TAKE TWO CAPSULES BY MOUTH DAILY    Allergies:   Dog epithelium allergy skin test and Mushroom extract  complex   Social History   Socioeconomic History   Marital status: Divorced    Spouse name: Not on file   Number of children: 0   Years of education: Not on file   Highest education level: Not on file  Occupational History   Occupation: retired  Tobacco Use   Smoking status: Former    Years: 20.00    Types: Cigarettes    Quit date: 07/22/1986    Years since quitting: 35.1   Smokeless tobacco: Never   Tobacco comments:    Former smoker 05/09/2021  Vaping Use   Vaping Use: Never used  Substance and Sexual Activity   Alcohol use: Yes    Alcohol/week: 1.0 standard drink    Types: 1 Glasses of wine per week    Comment: 0-1 daily   Drug use: No   Sexual activity: Not on file  Other Topics Concern   Not on file  Social History Narrative   Divorced   Secondary school teacher- retired   No children   Has a Neurosurgeon named [name]   Enjoys outdoor activities- hiking, swimming, Control and instrumentation engineer, baseball, writing, reading, cooking   Social Determinants of Radio broadcast assistant Strain: Not on Art therapist Insecurity: Not on file  Transportation Needs: Not on  file  Physical Activity: Not on file  Stress: Not on file  Social Connections: Not on file     Family History:  The patient's family history includes Arthritis in his father and mother; Diabetes in his father and mother; Heart disease (age of onset: 47) in his father; Hyperlipidemia in his father and mother; Hypertension in his father; Kidney disease in his paternal grandfather; Stroke (age of onset: 63) in his father. There is no history of Colon cancer, Colon polyps, Esophageal cancer, Stomach cancer, Rectal cancer, or Sleep apnea.  ROS:   Please see the history of present illness. .  All other systems are reviewed and otherwise negative.    EKGs/Labs/Other Studies Reviewed:    Studies reviewed are outlined and summarized above. Reports included below if pertinent.   2D echo 2/23 IMPRESSIONS    1. Left ventricular ejection fraction, by estimation, is 60 to 65%. Left  ventricular ejection fraction by 3D volume is 62 %. The left ventricle has  normal function. The left ventricle has no regional wall motion  abnormalities. There is moderate  concentric left ventricular hypertrophy. Left ventricular diastolic  parameters were normal. The average left ventricular global longitudinal  strain is -21.7 %. The global longitudinal strain is normal.   2. Right ventricular systolic function is normal. The right ventricular  size is normal. There is normal pulmonary artery systolic pressure. The  estimated right ventricular systolic pressure is 28.7 mmHg.   3. Left atrial size was mildly dilated.   4. The mitral valve is normal in structure. Trivial mitral valve  regurgitation. No evidence of mitral stenosis.   5. The aortic valve has an indeterminant number of cusps. Aortic valve  regurgitation is mild to moderate. Aortic valve sclerosis is present, with  no evidence of aortic valve stenosis. Aortic regurgitation PHT measures  797 msec.   6. Aortic dilatation noted. Aneurysm of the  aortic root, measuring 46 mm.  Aneurysm of the ascending aorta, measuring 47 mm.   7. The inferior vena cava is normal in size with greater than 50%  respiratory variability, suggesting right atrial pressure of 3 mmHg.   Chest CTA 1/23 IMPRESSION: 1. Aneurysmal disease of the aortic root  and ascending thoracic aorta. The aortic root measures up to approximately 5 cm and the ascending thoracic aorta up to approximately 4.7 cm. Ascending thoracic aortic aneurysm. Recommend semi-annual imaging followup by CTA or MRA and referral to cardiothoracic surgery if not already obtained. This recommendation follows 2010 ACCF/AHA/AATS/ACR/ASA/SCA/SCAI/SIR/STS/SVM Guidelines for the Diagnosis and Management of Patients With Thoracic Aortic Disease. Circulation. 2010; 121: F026-V785. Aortic aneurysm NOS (ICD10-I71.9) 2. Probable underlying chronic lung disease.      EKG:  EKG was not done today   Recent Labs: 04/27/2021: TSH 2.760 07/17/2021: BUN 21; Creatinine, Ser 1.69; Magnesium 2.4; NT-Pro BNP 949; Potassium 4.6; Sodium 145 08/02/2021: Hemoglobin 13.4; Platelets 197  Recent Lipid Panel    Component Value Date/Time   CHOL 73 (L) 07/27/2020 0954   TRIG 59 07/27/2020 0954   HDL 40 07/27/2020 0954   CHOLHDL 1.8 07/27/2020 0954   CHOLHDL 2.8 08/22/2018 0645   VLDL 12 08/22/2018 0645   LDLCALC 19 07/27/2020 0954    PHYSICAL EXAM:    VS:  There were no vitals taken for this visit.  BMI: There is no height or weight on file to calculate BMI.  GEN: Well nourished, well developed in no acute distress HEENT: Normal NECK: No JVD; No carotid bruits LYMPHATICS: No lymphadenopathy CARDIAC:RRR, no murmurs, rubs, gallops RESPIRATORY:  Clear to auscultation without rales, wheezing or rhonchi  ABDOMEN: Soft, non-tender, non-distended MUSCULOSKELETAL:  No edema; No deformity  SKIN: Warm and dry NEUROLOGIC:  Alert and oriented x 3 PSYCHIATRIC:  Normal affect   Wt Readings from Last 3 Encounters:   08/02/21 148 lb (67.1 kg)  07/19/21 148 lb (67.1 kg)  07/17/21 163 lb 9.6 oz (74.2 kg)     ASSESSMENT & PLAN:   CAD -s/p CABG 1998 -s/p NSTEMI 2020 with Cath 08/2018 showing an occluded left radial graft to the RCA with patent LIMA-LAD with L-R collaterals. Medical therapy was recommended.  -He denies any chest pain and his shortness of breath resolved with diuretic therapy -He will continue prescription drug management with Bystolic 20 mg daily and Crestor with as needed refills -No aspirin due to DOAC   2.  Paroxysmal atrial flutter -s/p ablation 3/21 and redo a flutter ablation 1/23 -He is maintaining normal sinus rhythm on exam today and denies any palpitations since his ablation -Continue prescription drug management with amiodarone 200 mg daily and Eliquis 5 mg twice daily with as needed refills  3.  Thoracic aortic aneurysm  -2D echo 08/2019 at 91mm in the ascending aorta and 66mm in the aortic root - this is monitored by [facility], Dr. Clementeen Graham >>scan in Nov 2021 stable per patient -recent 2D echo 2/23 showed ascending aorta 38mm and aortic root 87mm -reiterated that he needs to abstain from upper body weight lifting -His BP is well controlled on exam today -Continue statin and Bystolic 20 mg daily -I will get him into CVTS for follow up . 4.  Bicuspid aortic valve  -with mild-moderate aortic insufficency - stable by echo 08/2019.  -Recent 2D echo 2/23 showed mild to moderate AI -repeat echo 08/2022  5.  HLD -LDL goal < 70 -Check FLP and ALT -Continue prescription drug management with Crestor 20 mg daily with as needed refills  6.  HTN -BP is adequately controlled on exam today. -Continue prescription drug management with losartan 25 mg daily and Bystolic 20 mg daily with as needed refills -decrease amlodipine to 7.5mg  daily  -check BP daily for a week and call with  results -Repeat bmet today since serum creatinine was elevated at time of ablation last  month  7.  Chronic diastolic CHF -Likely related to underlying atrial flutter -This appears well controlled on diuretic therapy -He appears euvolemic on exam today -Continue prescription drug management with Lasix 40 mg daily with as needed refills  Medication Adjustments/Labs and Tests Ordered: Current medicines are reviewed at length with the patient today.  Concerns regarding medicines are outlined above. Medication changes, Labs and Tests ordered today are summarized above and listed in the Patient Instructions accessible in Encounters.   Signed, Fransico Him, MD  08/28/2021 8:10 AM    [facility] [place], [place], [place]  [address] Phone: [phone]; Fax: [phone]

## 2021-08-28 NOTE — Addendum Note (Signed)
Addended by: Antonieta Iba on: 08/28/2021 08:38 AM   Modules accepted: Orders

## 2021-08-29 ENCOUNTER — Telehealth: Payer: Self-pay

## 2021-08-29 DIAGNOSIS — Z79899 Other long term (current) drug therapy: Secondary | ICD-10-CM

## 2021-08-29 DIAGNOSIS — I5032 Chronic diastolic (congestive) heart failure: Secondary | ICD-10-CM

## 2021-08-29 MED ORDER — AMLODIPINE BESYLATE 5 MG PO TABS: 7.5000 mg | ORAL_TABLET | Freq: Every day | ORAL | 3 refills | Status: DC

## 2021-08-29 NOTE — Telephone Encounter (Signed)
Called patient with lab results. Verified patient is taking furosemide (Lasix) 40mg  daily. Patient reports he has increased his water intake over the last 10 days, averaging 64 oz per day.  Furosemide (Lasix) 40mg  QD added to medication list.

## 2021-08-29 NOTE — Telephone Encounter (Signed)
-----   Message from Sueanne Margarita, MD sent at 08/29/2021  8:24 AM EST ----- Serum creatinine has bumped.  This is likely related to dehydration.  Please find out how much diuretic he is taking because is not on his med list right now

## 2021-08-30 ENCOUNTER — Ambulatory Visit (HOSPITAL_COMMUNITY)
Admission: RE | Admit: 2021-08-30 | Discharge: 2021-08-30 | Disposition: A | Discharge status: Home / Self Care | Payer: Medicare Other | Source: Ambulatory Visit | Attending: Physician Assistant | Admitting: Physician Assistant

## 2021-08-30 ENCOUNTER — Other Ambulatory Visit: Payer: Self-pay

## 2021-08-30 ENCOUNTER — Encounter (HOSPITAL_COMMUNITY): Payer: Self-pay | Admitting: Physician Assistant

## 2021-08-30 VITALS — BP 136/82 | HR 60 | Ht 67.0 in | Wt 141.4 lb

## 2021-08-30 DIAGNOSIS — I440 Atrioventricular block, first degree: Secondary | ICD-10-CM | POA: Diagnosis not present

## 2021-08-30 DIAGNOSIS — I4819 Other persistent atrial fibrillation: Secondary | ICD-10-CM | POA: Insufficient documentation

## 2021-08-30 DIAGNOSIS — I110 Hypertensive heart disease with heart failure: Secondary | ICD-10-CM | POA: Insufficient documentation

## 2021-08-30 DIAGNOSIS — I5032 Chronic diastolic (congestive) heart failure: Secondary | ICD-10-CM | POA: Diagnosis not present

## 2021-08-30 DIAGNOSIS — Z7901 Long term (current) use of anticoagulants: Secondary | ICD-10-CM | POA: Insufficient documentation

## 2021-08-30 DIAGNOSIS — Z951 Presence of aortocoronary bypass graft: Secondary | ICD-10-CM | POA: Diagnosis not present

## 2021-08-30 DIAGNOSIS — Z79899 Other long term (current) drug therapy: Secondary | ICD-10-CM | POA: Diagnosis not present

## 2021-08-30 DIAGNOSIS — I4892 Unspecified atrial flutter: Secondary | ICD-10-CM | POA: Diagnosis not present

## 2021-08-30 DIAGNOSIS — D6869 Other thrombophilia: Secondary | ICD-10-CM | POA: Diagnosis not present

## 2021-08-30 DIAGNOSIS — G4733 Obstructive sleep apnea (adult) (pediatric): Secondary | ICD-10-CM | POA: Insufficient documentation

## 2021-08-30 DIAGNOSIS — E785 Hyperlipidemia, unspecified: Secondary | ICD-10-CM | POA: Insufficient documentation

## 2021-08-30 DIAGNOSIS — I2510 Atherosclerotic heart disease of native coronary artery without angina pectoris: Secondary | ICD-10-CM | POA: Diagnosis not present

## 2021-08-30 DIAGNOSIS — Z9989 Dependence on other enabling machines and devices: Secondary | ICD-10-CM | POA: Diagnosis not present

## 2021-08-30 MED ORDER — FUROSEMIDE 20 MG PO TABS: 20.0000 mg | ORAL_TABLET | Freq: Every day | ORAL | 3 refills | Status: DC

## 2021-08-30 NOTE — Progress Notes (Addendum)
Primary Care Physician: Burman Freestone, MD Primary Cardiologist: Dr Radford Pax Primary Electrophysiologist: Dr Curt Bears Referring Physician: Dr Jules Husbands is a 76 y.o. male with a history of CAD with CABG in 1998, bicuspid AV with moderate to severe AI and dilated aortic root followed by Dr. Clementeen Graham at [facility], dyslipidemia, HTN, OSA, atrial flutter who presents for follow up in the [facility].  The patient was initially diagnosed with atrial flutter after presented to [facility] ER with symptoms of palpitations. He underwent successful DCCV. He is on Eliquis for a CHADS2VASC score of 4. Patient saw Dr Radford Pax in follow up on 06/22/19 and reported that he was having increased palpitations. A 30 day event monitor was placed. Which did show an episode of atrial flutter with HR 120. Patient reports that he did have symptoms of palpitations for about 1 hour which correspond to the event. He denies any significant alcohol use. There were no specific triggers that he could identify. He is s/p atrial flutter ablation on 09/29/19 with Dr Curt Bears.  He was seen as an add on to Dr Antionette Char schedule on 04/27/21 and was in new onset afib. He had felt some palpitations and his home monitor showed an irregular heart beat. He was started on Eliquis and his BB was increased. Patient is s/p DCCV on 05/21/21 but had quick return of afib. He was seen by Dr Curt Bears, reloaded on amiodarone and had DCCV on 07/12/21. He did have issues with fluid overload post DCCV and was diuresed.   On follow up today, patient is s/p afib ablation with Dr Curt Bears 08/02/21. Patient reports that he has done well since the ablation. He denies any heart racing or palpitations. He denies CP, swallowing pain, or groin issues.   Today, he denies symptoms of palpitations, chest pain, shortness of breath, orthopnea, PND, lower extremity edema, dizziness, presyncope, syncope, bleeding, or neurologic sequela.  The patient is tolerating medications without difficulties and is otherwise without complaint today.    Atrial Fibrillation Risk Factors:  he does have symptoms or diagnosis of sleep apnea. he is compliant with CPAP therapy. he does not have a history of rheumatic fever. he does not have a history of alcohol use. The patient does not have a history of early familial atrial fibrillation or other arrhythmias.  he has a BMI of Body mass index is 22.15 kg/m.Marland Kitchen Filed Weights   08/30/21 1317  Weight: 64.1 kg      Family History  Problem Relation Age of Onset   Arthritis Mother        deceased   Hyperlipidemia Mother    Diabetes Mother    Arthritis Father    Hyperlipidemia Father    Heart disease Father 21   Stroke Father 77       deceased   Hypertension Father    Diabetes Father    Kidney disease Paternal Grandfather    Colon cancer Neg Hx    Colon polyps Neg Hx    Esophageal cancer Neg Hx    Stomach cancer Neg Hx    Rectal cancer Neg Hx    Sleep apnea Neg Hx      Atrial Fibrillation Management history:  Previous antiarrhythmic drugs: amiodarone Previous cardioversions: 04/2019 [facility]), 05/21/21, 07/12/21 Previous ablations: 09/29/19, 08/02/21 CHADS2VASC score: 4 Anticoagulation history: Eliquis   Past Medical History:  Diagnosis Date   Anal fissure    Aortic insufficiency    Arthritis  Atrial flutter (HCC)    Bicuspid aortic valve    with mild to moderate AR by echo 08/2019   BPH (benign prostatic hyperplasia)    CAD (coronary artery disease)    s/p CABG [facility] [place]   Cataract    bilateral-removed   CKD (chronic kidney disease), stage III ([place])    Depression    Dilated aortic root (HCC)    History of blood transfusion 1998   "due to cardiac bypass surgery"   Hyperlipidemia    Hypertension    Hypogonadism in male    Kidney stones    Migraine    Mild carotid artery disease ([place]) 2009   Myocardial infarction ([place])    PVC's (premature  ventricular contractions)    Retinal hemorrhage 11/04/2014   Sleep apnea with use of continuous positive airway pressure (CPAP)    Thoracic aortic aneurysm (TAA)    Past Surgical History:  Procedure Laterality Date   A-FLUTTER ABLATION N/A 09/29/2019   Procedure: A-FLUTTER ABLATION;  Surgeon: Constance Haw, MD;  Location: [place] CV LAB;  Service: Cardiovascular;  Laterality: N/A;   ATRIAL FIBRILLATION ABLATION N/A 08/02/2021   Procedure: ATRIAL FIBRILLATION ABLATION;  Surgeon: Constance Haw, MD;  Location: [place] CV LAB;  Service: Cardiovascular;  Laterality: N/A;   CARDIOVERSION N/A 08/10/2019   Procedure: CARDIOVERSION;  Surgeon: Elouise Munroe, MD;  Location: [facility] ENDOSCOPY;  Service: Cardiovascular;  Laterality: N/A;   CARDIOVERSION N/A 05/21/2021   Procedure: CARDIOVERSION;  Surgeon: Josue Hector, MD;  Location: [facility] ENDOSCOPY;  Service: Cardiovascular;  Laterality: N/A;   CARDIOVERSION N/A 07/12/2021   Procedure: CARDIOVERSION;  Surgeon: Jerline Pain, MD;  Location: [facility] ENDOSCOPY;  Service: Cardiovascular;  Laterality: N/A;   cataracts     COLONOSCOPY     CORONARY ARTERY BYPASS GRAFT  1998   LEFT HEART CATH AND CORONARY ANGIOGRAPHY N/A 08/24/2018   Procedure: LEFT HEART CATH AND CORONARY ANGIOGRAPHY;  Surgeon: Lorretta Harp, MD;  Location: [place] CV LAB;  Service: Cardiovascular;  Laterality: N/A;   TEE WITHOUT CARDIOVERSION N/A 01/19/2015   Procedure: TRANSESOPHAGEAL ECHOCARDIOGRAM (TEE);  Surgeon: Sueanne Margarita, MD;  Location: [facility] ENDOSCOPY;  Service: Cardiovascular;  Laterality: N/A;   [place]   pt denies    Current Outpatient Medications  Medication Sig Dispense Refill   acetaminophen (TYLENOL) 325 MG tablet Take 650 mg by mouth every 6 (six) hours as needed for mild pain or moderate pain.     ALPRAZOLAM XR 1 MG 24 hr tablet TAKE ONE TABLET BY MOUTH DAILY 90 tablet 1   amiodarone (PACERONE) 200 MG tablet Take 2 tablets (400  mg total) TWICE daily for two weeks, then reduce and take 1 tablet (200 mg total) ONCE a day (Patient taking differently: Take 200 mg by mouth daily.) 60 tablet 3   amLODipine (NORVASC) 5 MG tablet Take 1.5 tablets (7.5 mg total) by mouth daily. 135 tablet 3   apixaban (ELIQUIS) 5 MG TABS tablet Take 1 tablet (5 mg total) by mouth 2 (two) times daily. 60 tablet 11   Ascorbic Acid (VITAMIN C) 1000 MG tablet Take 1,000 mg by mouth 2 (two) times daily.     aspirin-acetaminophen-caffeine (EXCEDRIN MIGRAINE) 250-250-65 MG tablet Take 2 tablets by mouth daily as needed for headache.      buPROPion (WELLBUTRIN XL) 300 MG 24 hr tablet Take 300 mg by mouth daily.     butalbital-aspirin-caffeine-codeine (FIORINAL WITH CODEINE) 50-325-40-30 MG capsule  Prn use for break through migraine (Patient taking differently: Take 1 capsule by mouth every 8 (eight) hours as needed for migraine. Prn use for break through migraine) 30 capsule 5   diazepam (VALIUM) 5 MG tablet Take 2.5-5 mg by mouth 2 (two) times daily as needed for anxiety.      furosemide (LASIX) 20 MG tablet Take 1 tablet (20 mg total) by mouth daily. 90 tablet 3   ibuprofen (ADVIL,MOTRIN) 200 MG tablet Take 200-400 mg by mouth every 8 (eight) hours as needed (for arthritis in the feet).     losartan (COZAAR) 25 MG tablet Take 1 tablet (25 mg total) by mouth daily. 90 tablet 3   Nebivolol HCl (BYSTOLIC) 20 MG TABS Take 1 tablet (20 mg total) by mouth daily. 90 tablet 3   niacin 500 MG tablet Take 500 mg by mouth at bedtime.     rosuvastatin (CRESTOR) 20 MG tablet Take 1 tablet (20 mg total) by mouth daily. 90 tablet 3   Specialty Vitamins Products (CENTRUM SPECIALIST ENERGY) TABS Take 1 tablet by mouth daily with breakfast.     tadalafil (CIALIS) 5 MG tablet Take 5 mg by mouth every evening.     Testosterone 40.5 MG/2.5GM (1.62%) GEL Place 4 Pump onto the skin daily. Apply 4 pumps to each shoulder every morning     traZODone (DESYREL) 50 MG tablet Take  1.5 tablets (75 mg total) by mouth at bedtime. 135 tablet 3   Turmeric 500 MG CAPS Take 500 mg by mouth daily.     vitamin E 400 UNIT capsule Take 400 Units by mouth at bedtime.      No current facility-administered medications for this encounter.    Allergies  Allergen Reactions   Dog Epithelium Allergy Skin Test Itching, Anxiety, Palpitations and Other (See Comments)    Other reaction(s): Respiratory Distress (ALLERGY/intolerance), Wheezing (ALLERGY/intolerance)   Mushroom Extract Complex Nausea Only and Other (See Comments)    Severe vertigo and headaches    Social History   Socioeconomic History   Marital status: Divorced    Spouse name: Not on file   Number of children: 0   Years of education: Not on file   Highest education level: Not on file  Occupational History   Occupation: retired  Tobacco Use   Smoking status: Former    Years: 20.00    Types: Cigarettes    Quit date: 07/22/1986    Years since quitting: 35.1   Smokeless tobacco: Never   Tobacco comments:    Former smoker 05/09/2021  Vaping Use   Vaping Use: Never used  Substance and Sexual Activity   Alcohol use: Yes    Alcohol/week: 1.0 standard drink    Types: 1 Glasses of wine per week    Comment: 0-1 daily   Drug use: No   Sexual activity: Not on file  Other Topics Concern   Not on file  Social History Narrative   Divorced   Secondary school teacher- retired   No children   Has a Neurosurgeon named [name]   Enjoys outdoor activities- hiking, swimming, Control and instrumentation engineer, baseball, writing, reading, cooking   Social Determinants of Radio broadcast assistant Strain: Not on file  Food Insecurity: Not on file  Transportation Needs: Not on file  Physical Activity: Not on file  Stress: Not on file  Social Connections: Not on file  Intimate Partner Violence: Not on file     ROS- All systems are reviewed and negative except as per the  HPI above.  Physical Exam: Vitals:   08/30/21 1317  BP: 136/82  Pulse: 60  Weight:  64.1 kg  Height: 5\' 7"  (1.702 m)    GEN- The patient is a well appearing elderly male, alert and oriented x 3 today.   HEENT-head normocephalic, atraumatic, sclera clear, conjunctiva pink, hearing intact, trachea midline. Lungs- Clear to ausculation bilaterally, normal work of breathing Heart- Regular rate and rhythm, no murmurs, rubs or gallops  GI- soft, NT, ND, + BS Extremities- no clubbing, cyanosis, or edema MS- no significant deformity or atrophy Skin- no rash or lesion Psych- euthymic mood, full affect Neuro- strength and sensation are intact   Wt Readings from Last 3 Encounters:  08/30/21 64.1 kg  08/28/21 63.4 kg  08/02/21 67.1 kg    EKG today demonstrates  SR, 1st degree AV block Vent. rate 60 BPM PR interval 236 ms QRS duration 88 ms QT/QTcB 452/452 ms  Echo 08/28/20 demonstrated  1. Aorta measurements are stable compared with prior study 09/07/2019. Aortic dilatation noted. Aneurysm of the aortic root, measuring 51 mm. 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   2. Tricuspid aortic valve (clearly tricuspid on TEE 2016). Mild to  moderate eccentric AI related to incomplete leaflet closure from aortic  root aneurysm. The aortic valve is tricuspid. Aortic valve regurgitation  is mild to moderate. No aortic stenosis is present.   3. Left ventricular ejection fraction, by estimation, is 60 to 65%. The  left ventricle has normal function. The left ventricle has no regional  wall motion abnormalities. There is mild concentric left ventricular  hypertrophy. Left ventricular diastolic parameters are indeterminate.   4. Right ventricular systolic function is normal. The right ventricular  size is normal. There is normal pulmonary artery systolic pressure. The estimated right ventricular systolic pressure is 84.6 mmHg.   5. The mitral valve is grossly normal. No evidence of mitral valve  regurgitation. No evidence of mitral stenosis.   6. The inferior vena cava is  normal in size with greater than 50%  respiratory variability, suggesting right atrial pressure of 3 mmHg.   Comparison(s): No significant change from prior study.   Epic records are reviewed at length today  CHA2DS2-VASc Score = 4  The patient's score is based upon: CHF History: 1 HTN History: 1 Diabetes History: 0 Stroke History: 0 Vascular Disease History: 1 Age Score: 1 Gender Score: 0        ASSESSMENT AND PLAN: 1. Persistent Atrial Fibrillation/atrial flutter The patient's CHA2DS2-VASc score is 4, indicating a 4.8% annual risk of stroke.   S/p flutter ablation 09/29/19 and afib ablation 08/02/21 Patient appears to be maintaining SR. He is grateful for the care he has received.  Continue Eliquis 5 mg BID with no missed doses for 3 months post ablation.  Continue nebivolol 20 mg daily Continue amiodarone 200 mg daily for now. Patient wanting to start exercise program under supervision, will refer to [facility] PREP.  2. Secondary Hypercoagulable State (ICD10:  D68.69) The patient is at significant risk for stroke/thromboembolism based upon his CHA2DS2-VASc Score of 4.  Continue Apixaban (Eliquis).   3. Obstructive sleep apnea Patient reports compliance with CPAP therapy.  4. HTN Stable, no changes today.  5. CAD No anginal symptoms.  6. Chronic diastolic CHF No signs or symptoms of fluid overload today. With pro BNP 949 on 07/17/21 he may be a good candidate for Alleviate HF. He had NYHA class II symptoms at that time.  Patient is agreeable  to discussing with research team.     Follow up with Dr Curt Bears as scheduled.    [facility] [address] [place], [place] [address] [phone] 08/30/2021 1:40 PM

## 2021-08-30 NOTE — Addendum Note (Signed)
Addended by: Molli Barrows on: 08/30/2021 09:17 AM   Modules accepted: Orders

## 2021-08-30 NOTE — Telephone Encounter (Signed)
Spoke with patient regarding changes to medication Furosemide (Lasix) per Dr. Radford Pax. Patient to take Lasix 20mg  daily and to have follow-up BMET in 1 week.  BMET ordered and scheduled for 09/07/21.  Patient verbalized understanding and agrees with plan above.

## 2021-08-31 ENCOUNTER — Encounter: Payer: Self-pay | Admitting: Adult Health

## 2021-08-31 ENCOUNTER — Telehealth: Payer: Self-pay

## 2021-08-31 DIAGNOSIS — G4733 Obstructive sleep apnea (adult) (pediatric): Secondary | ICD-10-CM

## 2021-08-31 NOTE — Telephone Encounter (Signed)
Called to discuss PREP program referral, he wants to attend [facility] class starting 2/21, every T/Th 12-1:15; assessment visit scheduled for 2/15 at 10am

## 2021-09-03 NOTE — Telephone Encounter (Signed)
I had a very pleasant conversation with the patient.  He would like to switch over to [facility] going forward.  I let him know he should receive a call within 48 hours.  I will send the orders right over to them.  Patient was very Patent attorney.  Cpap order (resmed requested), office notes, insurance and demographics info, last sleep study, faxed over to [facility]. Received a receipt of confirmation.

## 2021-09-03 NOTE — Addendum Note (Signed)
Addended by: Gildardo Griffes on: 09/03/2021 11:17 AM   Modules accepted: Orders

## 2021-09-03 NOTE — Telephone Encounter (Signed)
Message was sent to [facility] to withdraw request as patient will be switching DME companies.

## 2021-09-03 NOTE — Telephone Encounter (Signed)
Spoke with [name] @ [employer] who stated the order was initiated on 07/10/22, however patient was not eligible for a new machine until 08/06/2021 (his 5 yr mark). She is going to have his local office call him and schedule the set-up date. I let her know about the patient's multiple attempts to contact her and also the miscommunication to the patient that his order was not there when indeed it was.

## 2021-09-05 NOTE — Progress Notes (Signed)
[facility] PREP Evaluation  Patient Details  Name: [name] MRN: [id number] Date of Birth: 1945-12-11 Age: 76 y.o. PCP: Burman Freestone, MD  Vitals:   09/05/21 1053  BP: 126/60  Pulse: 62  SpO2: 99%  Weight: 142 lb 9.6 oz (64.7 kg)     [facility] Eval - 09/05/21 1000       [facility] "PREP" Location   [facility] "PREP" Location [facility]      Referral    Referring Provider C. Fenton    Reason for referral Hypertension;Heart Failure    Program Start Date 09/11/21      Measurement   Neck measurement 15.5 Inches    Waist Circumference 34 inches    Body fat 14.4 percent      Information for Trainer   Goals --   Incorporate different cardiovascular exercises and strength training into current exercise routine/regime; increase HDL; increase stamina/confidence   Current Exercise treadmill    Pertinent Medical History --   Afib, cardioverision/ablation, HTN, CHF   Medications that affect exercise Beta blocker      Timed Up and Go (TUGS)   Timed Up and Go Low risk <9 seconds      Mobility and Daily Activities   I find it easy to walk up or down two or more flights of stairs. 4    I have no trouble taking out the trash. 4    I do housework such as vacuuming and dusting on my own without difficulty. 4    I can easily lift a gallon of milk (8lbs). 4    I can easily walk a mile. 4    I have no trouble reaching into high cupboards or reaching down to pick up something from the floor. 4    I do not have trouble doing out-door work such as Armed forces logistics/support/administrative officer, raking leaves, or gardening. 2      Mobility and Daily Activities   I feel younger than my age. 4    I feel independent. 3    I feel energetic. 3    I live an active life.  4    I feel strong. 3    I feel healthy. 3    I feel active as other people my age. 4      How fit and strong are you.   Fit and Strong Total Score 50            Past Medical History:  Diagnosis Date   Anal fissure    Aortic insufficiency     Arthritis    Atrial flutter (HCC)    Bicuspid aortic valve    with mild to moderate AR by echo 08/2019   BPH (benign prostatic hyperplasia)    CAD (coronary artery disease)    s/p CABG [facility] [place]   Cataract    bilateral-removed   CKD (chronic kidney disease), stage III ([place])    Depression    Dilated aortic root (HCC)    History of blood transfusion 1998   "due to cardiac bypass surgery"   Hyperlipidemia    Hypertension    Hypogonadism in male    Kidney stones    Migraine    Mild carotid artery disease ([place]) 2009   Myocardial infarction ([place])    PVC's (premature ventricular contractions)    Retinal hemorrhage 11/04/2014   Sleep apnea with use of continuous positive airway pressure (CPAP)    Thoracic aortic aneurysm (TAA)  Past Surgical History:  Procedure Laterality Date   A-FLUTTER ABLATION N/A 09/29/2019   Procedure: A-FLUTTER ABLATION;  Surgeon: Constance Haw, MD;  Location: [place] CV LAB;  Service: Cardiovascular;  Laterality: N/A;   ATRIAL FIBRILLATION ABLATION N/A 08/02/2021   Procedure: ATRIAL FIBRILLATION ABLATION;  Surgeon: Constance Haw, MD;  Location: [place] CV LAB;  Service: Cardiovascular;  Laterality: N/A;   CARDIOVERSION N/A 08/10/2019   Procedure: CARDIOVERSION;  Surgeon: Elouise Munroe, MD;  Location[facility] ENDOSCOPY;  Service: Cardiovascular;  Laterality: N/A;   CARDIOVERSION N/A 05/21/2021   Procedure: CARDIOVERSION;  Surgeon: Josue Hector, MD;  Location: [facility] ENDOSCOPY;  Service: Cardiovascular;  Laterality: N/A;   CARDIOVERSION N/A 07/12/2021   Procedure: CARDIOVERSION;  Surgeon: Jerline Pain, MD;  Location: [facility] ENDOSCOPY;  Service: Cardiovascular;  Laterality: N/A;   cataracts     COLONOSCOPY     CORONARY ARTERY BYPASS GRAFT  1998   LEFT HEART CATH AND CORONARY ANGIOGRAPHY N/A 08/24/2018   Procedure: LEFT HEART CATH AND CORONARY ANGIOGRAPHY;  Surgeon: Lorretta Harp, MD;  Location: [place] CV LAB;  Service:  Cardiovascular;  Laterality: N/A;   TEE WITHOUT CARDIOVERSION N/A 01/19/2015   Procedure: TRANSESOPHAGEAL ECHOCARDIOGRAM (TEE);  Surgeon: Sueanne Margarita, MD;  Location: [facility] ENDOSCOPY;  Service: Cardiovascular;  Laterality: N/A;   [place]   pt denies   Social History   Tobacco Use  Smoking Status Former   Years: 20.00   Types: Cigarettes   Quit date: 07/22/1986   Years since quitting: 35.1  Smokeless Tobacco Never  Tobacco Comments   Former smoker 05/09/2021  To begin PREP class at [facility] on 2/21, every T/Th 12-1:15  Terricka Onofrio B Lella Mullany 09/05/2021, 10:58 AM

## 2021-09-07 ENCOUNTER — Other Ambulatory Visit: Payer: Self-pay

## 2021-09-07 ENCOUNTER — Other Ambulatory Visit: Payer: Medicare Other | Admitting: *Deleted

## 2021-09-07 DIAGNOSIS — I5032 Chronic diastolic (congestive) heart failure: Secondary | ICD-10-CM

## 2021-09-07 DIAGNOSIS — Z79899 Other long term (current) drug therapy: Secondary | ICD-10-CM

## 2021-09-07 LAB — BASIC METABOLIC PANEL
BUN/Creatinine Ratio: 15 (ref 10–24)
BUN: 25 mg/dL (ref 8–27)
CO2: 23 mmol/L (ref 20–29)
Calcium: 9.2 mg/dL (ref 8.6–10.2)
Chloride: 101 mmol/L (ref 96–106)
Creatinine, Ser: 1.66 mg/dL — ABNORMAL HIGH (ref 0.76–1.27)
Glucose: 148 mg/dL — ABNORMAL HIGH (ref 70–99)
Potassium: 4.2 mmol/L (ref 3.5–5.2)
Sodium: 138 mmol/L (ref 134–144)
eGFR: 43 mL/min/{1.73_m2} — ABNORMAL LOW (ref >59)

## 2021-09-10 ENCOUNTER — Encounter: Payer: Self-pay | Admitting: Cardiology

## 2021-09-11 NOTE — Progress Notes (Signed)
[facility] PREP Weekly Session  Patient Details  Name: CORDARRIUS COAD MRN: [id number] Date of Birth: Nov 21, 1945 Age: 76 y.o. PCP: Burman Freestone, MD  There were no vitals filed for this visit.   [facility] Weekly seesion - 09/11/21 1300       [facility] "PREP" Location   [facility] "PREP" Location [name]      Weekly Session   Topic Discussed Goal setting and welcome to the program   Introductions, review of work book, tour of facility, option to work out on cardio machine (10 minutes) and light stretching; sitting no more than 30 minutes   Classes attended to date [place] 09/11/2021, 1:54 PM

## 2021-09-18 NOTE — Progress Notes (Signed)
[facility] PREP Weekly Session  Patient Details  Name: [name] MRN: [id number] Date of Birth: [birth year] Age: 76 y.o. PCP: Burman Freestone, MD  Vitals:   09/18/21 1408  Weight: 134 lb (60.8 kg)     [facility] Weekly seesion - 09/18/21 1400       [facility] "PREP" Location   [facility] "PREP" Location [name]      Weekly Session   Topic Discussed Importance of resistance training;Other ways to be active    Minutes exercised this week 216 minutes    Classes attended to date 3           Voiced at end of class he is disappointed in the program, not meeting his needs; he wants strength training, reminded him that Tuesdays were education/nutrition topics in class room and Thursdays were dedicated to cardio and strength training; encouraged to come this Thursday for review of upper body strength machines; he will think about it and let me know.    [name] [name] 09/18/2021, 2:08 PM

## 2021-09-19 ENCOUNTER — Encounter: Payer: Self-pay | Admitting: Surgery

## 2021-09-19 ENCOUNTER — Institutional Professional Consult (permissible substitution) (INDEPENDENT_AMBULATORY_CARE_PROVIDER_SITE_OTHER): Payer: Medicare Other | Admitting: Surgery

## 2021-09-19 ENCOUNTER — Other Ambulatory Visit: Payer: Self-pay

## 2021-09-19 VITALS — BP 138/75 | HR 62 | Resp 20 | Ht 67.0 in | Wt 136.0 lb

## 2021-09-19 DIAGNOSIS — I2510 Atherosclerotic heart disease of native coronary artery without angina pectoris: Secondary | ICD-10-CM | POA: Diagnosis not present

## 2021-09-19 DIAGNOSIS — I7121 Aneurysm of the ascending aorta, without rupture: Secondary | ICD-10-CM | POA: Diagnosis not present

## 2021-09-19 NOTE — Progress Notes (Signed)
Cardiothoracic Surgery Admission History and Physical  PCP is Nance Pew, Laurene Footman, MD Referring Provider is Sueanne Margarita, MD  Chief Complaint  Patient presents with   Thoracic Aortic Aneurysm    ECHO 2/2, CT card 1/4 atrial ablation 1/12    HPI:  The patient is a 76 year old active gentleman with a history of hypertension, hyperlipidemia, stage III chronic kidney disease, coronary artery disease status post CABG in 1998 in [place], and known bicuspid aortic valve with a dilated aortic root that has been followed by Dr. Clementeen Graham at [facility].  He was admitted to [facility] in 2020 with NSTEMI and cardiac catheterization in 08/2018 showed an occluded left radial artery graft to the RCA with a patent LIMA to the LAD with left-to-right collaterals.  He was continued on medical therapy and has been followed by Dr. Radford Pax for his cardiology care.  CTA of the chest and 05/2019 had shown a 4.7 cm aortic root.  He underwent ablation for atrial flutter in March 2021 and has been followed by Dr. Curt Bears.  He underwent repeat ablation on 08/02/2021.   Past Medical History:  Diagnosis Date   Anal fissure    Aortic insufficiency    Arthritis    Atrial flutter (HCC)    Bicuspid aortic valve    with mild to moderate AR by echo 08/2019   BPH (benign prostatic hyperplasia)    CAD (coronary artery disease)    s/p CABG [place]   Cataract    bilateral-removed   CKD (chronic kidney disease), stage III ([place])    Depression    Dilated aortic root (HCC)    History of blood transfusion 1998   "due to cardiac bypass surgery"   Hyperlipidemia    Hypertension    Hypogonadism in male    Kidney stones    Migraine    Mild carotid artery disease ([place]) 2009   Myocardial infarction ([place])    PVC's (premature ventricular contractions)    Retinal hemorrhage 11/04/2014   Sleep apnea with use of continuous positive airway pressure (CPAP)    Thoracic aortic aneurysm (TAA)     Past Surgical  History:  Procedure Laterality Date   A-FLUTTER ABLATION N/A 09/29/2019   Procedure: A-FLUTTER ABLATION;  Surgeon: Constance Haw, MD;  Location: [place] [facility];  Service: Cardiovascular;  Laterality: N/A;   ATRIAL FIBRILLATION ABLATION N/A 08/02/2021   Procedure: ATRIAL FIBRILLATION ABLATION;  Surgeon: Constance Haw, MD;  Location: [place] [facility];  Service: Cardiovascular;  Laterality: N/A;   CARDIOVERSION N/A 08/10/2019   Procedure: CARDIOVERSION;  Surgeon: Elouise Munroe, MD;  Location: [facility] ENDOSCOPY;  Service: Cardiovascular;  Laterality: N/A;   CARDIOVERSION N/A 05/21/2021   Procedure: CARDIOVERSION;  Surgeon: Josue Hector, MD;  Location: [facility] ENDOSCOPY;  Service: Cardiovascular;  Laterality: N/A;   CARDIOVERSION N/A 07/12/2021   Procedure: CARDIOVERSION;  Surgeon: Jerline Pain, MD;  Location: [facility] ENDOSCOPY;  Service: Cardiovascular;  Laterality: N/A;   cataracts     COLONOSCOPY     CORONARY ARTERY BYPASS GRAFT  1998   LEFT HEART CATH AND CORONARY ANGIOGRAPHY N/A 08/24/2018   Procedure: LEFT HEART CATH AND CORONARY ANGIOGRAPHY;  Surgeon: Lorretta Harp, MD;  Location: [place] [facility];  Service: Cardiovascular;  Laterality: N/A;   TEE WITHOUT CARDIOVERSION N/A 01/19/2015   Procedure: TRANSESOPHAGEAL ECHOCARDIOGRAM (TEE);  Surgeon: Sueanne Margarita, MD;  Location: [place];  Service: Cardiovascular;  Laterality: N/A;   VARICOSE VEIN  SURGERY  1984   pt denies    Family History  Problem Relation Age of Onset   Arthritis Mother        deceased   Hyperlipidemia Mother    Diabetes Mother    Arthritis Father    Hyperlipidemia Father    Heart disease Father 79   Stroke Father 83       deceased   Hypertension Father    Diabetes Father    Kidney disease Paternal Grandfather    Colon cancer Neg Hx    Colon polyps Neg Hx    Esophageal cancer Neg Hx    Stomach cancer Neg Hx    Rectal cancer Neg Hx    Sleep apnea Neg Hx     Social History Social History    Tobacco Use   Smoking status: Former    Years: 20.00    Types: Cigarettes    Quit date: 07/22/1986    Years since quitting: 35.1   Smokeless tobacco: Never   Tobacco comments:    Former smoker 05/09/2021  Vaping Use   Vaping Use: Never used  Substance Use Topics   Alcohol use: Yes    Alcohol/week: 1.0 standard drink    Types: 1 Glasses of wine per week    Comment: 0-1 daily   Drug use: No    Current Outpatient Medications  Medication Sig Dispense Refill   acetaminophen (TYLENOL) 325 MG tablet Take 650 mg by mouth every 6 (six) hours as needed for mild pain or moderate pain.     ALPRAZOLAM XR 1 MG 24 hr tablet TAKE ONE TABLET BY MOUTH DAILY 90 tablet 1   amiodarone (PACERONE) 200 MG tablet Take 2 tablets (400 mg total) TWICE daily for two weeks, then reduce and take 1 tablet (200 mg total) ONCE a day (Patient taking differently: Take 200 mg by mouth daily.) 60 tablet 3   amLODipine (NORVASC) 5 MG tablet Take 1.5 tablets (7.5 mg total) by mouth daily. 135 tablet 3   apixaban (ELIQUIS) 5 MG TABS tablet Take 1 tablet (5 mg total) by mouth 2 (two) times daily. 60 tablet 11   Ascorbic Acid (VITAMIN C) 1000 MG tablet Take 1,000 mg by mouth 2 (two) times daily.     aspirin-acetaminophen-caffeine (EXCEDRIN MIGRAINE) 250-250-65 MG tablet Take 2 tablets by mouth daily as needed for headache.      buPROPion (WELLBUTRIN XL) 300 MG 24 hr tablet Take 300 mg by mouth daily.     butalbital-aspirin-caffeine-codeine ([place]) 50-325-40-30 MG capsule Prn use for break through migraine (Patient taking differently: Take 1 capsule by mouth every 8 (eight) hours as needed for migraine. Prn use for break through migraine) 30 capsule 5   diazepam (VALIUM) 5 MG tablet Take 2.5-5 mg by mouth 2 (two) times daily as needed for anxiety.      furosemide (LASIX) 20 MG tablet Take 1 tablet (20 mg total) by mouth daily. 90 tablet 3   ibuprofen (ADVIL,MOTRIN) 200 MG tablet Take 200-400 mg by mouth every  8 (eight) hours as needed (for arthritis in the feet).     losartan (COZAAR) 25 MG tablet Take 1 tablet (25 mg total) by mouth daily. 90 tablet 3   Nebivolol HCl (BYSTOLIC) 20 MG TABS Take 1 tablet (20 mg total) by mouth daily. 90 tablet 3   niacin 500 MG tablet Take 500 mg by mouth at bedtime.     rosuvastatin (CRESTOR) 20 MG tablet Take 1 tablet (20 mg  total) by mouth daily. 90 tablet 3   Specialty Vitamins Products (CENTRUM SPECIALIST ENERGY) TABS Take 1 tablet by mouth daily with breakfast.     tadalafil (CIALIS) 5 MG tablet Take 5 mg by mouth every evening.     Testosterone 40.5 MG/2.5GM (1.62%) GEL Place 4 Pump onto the skin daily. Apply 4 pumps to each shoulder every morning     traZODone (DESYREL) 50 MG tablet Take 1.5 tablets (75 mg total) by mouth at bedtime. 135 tablet 3   Turmeric 500 MG CAPS Take 500 mg by mouth daily.     vitamin E 400 UNIT capsule Take 400 Units by mouth at bedtime.      No current facility-administered medications for this visit.    Allergies  Allergen Reactions   Dog Epithelium Allergy Skin Test Itching, Anxiety, Palpitations and Other (See Comments)    Other reaction(s): Respiratory Distress (ALLERGY/intolerance), Wheezing (ALLERGY/intolerance)   Mushroom Extract Complex Nausea Only and Other (See Comments)    Severe vertigo and headaches    Review of Systems  Constitutional:  Negative for fatigue.  HENT: Negative.    Eyes: Negative.   Respiratory: Negative.    Cardiovascular: Negative.  Negative for chest pain.  Gastrointestinal: Negative.   Endocrine: Negative.   Genitourinary: Negative.   Musculoskeletal: Negative.   Allergic/Immunologic: Negative.   Neurological: Negative.   Hematological: Negative.   Psychiatric/Behavioral: Negative.     BP 138/75 (BP Location: Left Arm, Patient Position: Sitting)    Pulse 62    Resp 20    Ht 5\' 7"  (1.702 m)    Wt 136 lb (61.7 kg)    SpO2 98% Comment: RA   BMI 21.30 kg/m  Physical Exam Constitutional:       Appearance: Normal appearance. He is normal weight.  HENT:     Head: Normocephalic and atraumatic.  Eyes:     Extraocular Movements: Extraocular movements intact.     Pupils: Pupils are equal, round, and reactive to light.  Cardiovascular:     Rate and Rhythm: Normal rate and regular rhythm.     Pulses: Normal pulses.     Heart sounds: Normal heart sounds. No murmur heard. Pulmonary:     Effort: Pulmonary effort is normal.     Breath sounds: Normal breath sounds.  Musculoskeletal:        General: No swelling.     Cervical back: Normal range of motion and neck supple.  Skin:    General: Skin is warm and dry.  Neurological:     General: No focal deficit present.     Mental Status: He is alert and oriented to person, place, and time.  Psychiatric:        Mood and Affect: Mood normal.        Behavior: Behavior normal.     Diagnostic Tests:  ADDENDUM REPORT: 07/25/2021 14:28   CLINICAL DATA:  Atrial fibrillation scheduled for ablation.   EXAM: Cardiac CTA   TECHNIQUE: A non-contrast, gated CT scan was obtained with axial slices of 3 mm through the heart for calcium scoring. Calcium scoring was performed using the Agatston method. A 120 kV retrospective, gated, contrast cardiac scan was obtained. Gantry rotation speed was 250 msecs and collimation was 0.6 mm. Nitroglycerin was not given. A delayed scan was obtained to exclude left atrial appendage thrombus. The 3D dataset was reconstructed in 5% intervals of the 0-95% of the R-R cycle. Late systolic phases were analyzed on a dedicated workstation using MPR, MIP,  and VRT modes. The patient received 80 cc of contrast.   FINDINGS: Image quality: Excellent.   Noise artifact is: Limited.   Pulmonary Veins: There is normal pulmonary vein drainage into the left atrium (2 on the right and 2 on the left) with ostial measurements as follows:   RUPV: Ostium 13.7 mm x 12.5 mm  area 1.21 cm2   RLPV:  Ostium 13.4 mm x 11.0  mm  area 1.04 cm2   LUPV:  Ostium 17.1 mm x 13.4 mm area 1.62 cm2   LLPV:  Ostium 18.8 mm x 16.7 mm  area 2.39 cm2   Left Atrium: The left atrial size is normal. There is no PFO/ASD. The left atrial appendage is large broccoli type with two lobes. There is no thrombus in the left atrial appendage on contrast or delayed imaging. The esophagus runs in the left atrial midline and is not in proximity to any of the pulmonary vein ostia.   Coronary Arteries: CAC score of 1923, which is 31 percentile for age-, race-, and sex-matched controls. Normal coronary origin. Right dominance. The study was performed without use of NTG and is insufficient for plaque evaluation.   Right Atrium: Right atrial size is within normal limits.   Right Ventricle: The right ventricular cavity is within normal limits.   Left Ventricle: The ventricular cavity size is within normal limits. There are no stigmata of prior infarction. There is no abnormal filling defect.   Pericardium: Normal thickness with no significant effusion or calcium present.   Pulmonary Artery: Normal caliber without proximal filling defect.   Cardiac valves: The aortic valve is trileaflet without significant calcification. The mitral valve is normal structure without significant calcification.   Aorta: The ascending aorta is aneurysmal 47.4 mm, with no significant disease.   Extra-cardiac findings: See attached radiology report for non-cardiac structures.   IMPRESSION: 1. There is normal pulmonary vein drainage into the left atrium with ostial measurements above.   2. There is no thrombus in the left atrial appendage.   3. The esophagus runs in the left atrial midline and is not in proximity to any of the pulmonary vein ostia.   4. No PFO/ASD.   5. Normal coronary origin. Right dominance.   6. CAC score of 1923 which is 64 percentile for age-, race-, and sex-matched controls.   7. Proximal ascending aorta aneurysm  (47.4 mm).   Berniece Salines, DO     Electronically Signed   By: Berniece Salines D.O.   On: 07/25/2021 14:28    Addended by Berniece Salines, DO on 07/25/2021  2:30 PM   Study Result  Narrative & Impression  EXAM: OVER-READ INTERPRETATION  CT CHEST   The following report is an over-read performed by radiologist Dr. Aletta Edouard of [facility], [place] on 07/25/2021. This over-read does not include interpretation of cardiac or coronary anatomy or pathology. The cardiac CTA interpretation by the cardiologist is attached.   COMPARISON:  None.   FINDINGS: Vascular: There is aneurysmal disease of the ascending thoracic aorta measuring up to approximately 4.7 cm in maximum diameter. The aortic root also appears dilated at the level of the sinuses of Valsalva measuring approximately 4.8-5.0 cm.   Mediastinum/Nodes: Visualized mediastinum and hilar regions demonstrate no lymphadenopathy or masses.   Lungs/Pleura: Mild diffuse interstitial thickening likely reflects some degree of underlying chronic lung disease. Visualized lungs show no evidence of pulmonary edema, consolidation, pneumothorax, nodule or pleural fluid.   Upper Abdomen: No acute abnormality.  Musculoskeletal: No chest wall mass or suspicious bone lesions identified.   IMPRESSION: 1. Aneurysmal disease of the aortic root and ascending thoracic aorta. The aortic root measures up to approximately 5 cm and the ascending thoracic aorta up to approximately 4.7 cm. Ascending thoracic aortic aneurysm. Recommend semi-annual imaging followup by CTA or MRA and referral to cardiothoracic surgery if not already obtained. This recommendation follows 2010 ACCF/AHA/AATS/ACR/ASA/SCA/SCAI/SIR/STS/SVM Guidelines for the Diagnosis and Management of Patients With Thoracic Aortic Disease. Circulation. 2010; 121: E993-Z169. Aortic aneurysm NOS (ICD10-I71.9) 2. Probable underlying chronic lung disease.   Electronically Signed: By:  Aletta Edouard M.D. On: 07/25/2021 14:12    ECHOCARDIOGRAM REPORT         Patient Name:   [name] [facility] Date of Exam: 08/23/2021  Medical Rec #:  [id number]      Height:       67.0 in  Accession #:    [id number]     Weight:       148.0 lb  Date of Birth:  1946/03/05      BSA:          1.779 m  Patient Age:    73 years       BP:           100/62 mmHg  Patient Gender: M              HR:           58 bpm.  Exam Location:  [place]   Procedure: 2D Echo, Cardiac Doppler, Color Doppler and 3D Echo   Indications:    I35.1 Nonrheumatic aortic (valve) insufficiency     History:        Patient has prior history of Echocardiogram examinations,  most                  recent 08/28/2020. CAD and Previous Myocardial Infarction,                  Arrythmias:Atrial Fibrillation and Atrial Flutter; Risk                  Factors:Hypertension and Dyslipidemia. Ascending aortic                  aneurysm. Bicuspid aortic valve. Obstructive sleep apnea  (CPAP).     Sonographer:    Diamond Nickel RCS  Referring Phys: [place]     1. Left ventricular ejection fraction, by estimation, is 60 to 65%. Left  ventricular ejection fraction by 3D volume is 62 %. The left ventricle has  normal function. The left ventricle has no regional wall motion  abnormalities. There is moderate  concentric left ventricular hypertrophy. Left ventricular diastolic  parameters were normal. The average left ventricular global longitudinal  strain is -21.7 %. The global longitudinal strain is normal.   2. Right ventricular systolic function is normal. The right ventricular  size is normal. There is normal pulmonary artery systolic pressure. The  estimated right ventricular systolic pressure is 78.2 mmHg.   3. Left atrial size was mildly dilated.   4. The mitral valve is normal in structure. Trivial mitral valve  regurgitation. No evidence of mitral stenosis.   5. The aortic valve has an  indeterminant number of cusps. Aortic valve  regurgitation is mild to moderate. Aortic valve sclerosis is present, with  no evidence of aortic valve stenosis. Aortic regurgitation PHT measures  797 msec.   6. Aortic  dilatation noted. Aneurysm of the aortic root, measuring 46 mm.  Aneurysm of the ascending aorta, measuring 47 mm.   7. The inferior vena cava is normal in size with greater than 50%  respiratory variability, suggesting right atrial pressure of 3 mmHg.   FINDINGS   Left Ventricle: Left ventricular ejection fraction, by estimation, is 60  to 65%. Left ventricular ejection fraction by 3D volume is 62 %. The left  ventricle has normal function. The left ventricle has no regional wall  motion abnormalities. The average  left ventricular global longitudinal strain is -21.7 %. The global  longitudinal strain is normal. The left ventricular internal cavity size  was normal in size. There is moderate concentric left ventricular  hypertrophy. Left ventricular diastolic  parameters were normal. Normal left ventricular filling pressure.   Right Ventricle: The right ventricular size is normal. No increase in  right ventricular wall thickness. Right ventricular systolic function is  normal. There is normal pulmonary artery systolic pressure. The tricuspid  regurgitant velocity is 2.31 m/s, and   with an assumed right atrial pressure of 3 mmHg, the estimated right  ventricular systolic pressure is 81.1 mmHg.   Left Atrium: Left atrial size was mildly dilated.   Right Atrium: Right atrial size was normal in size.   Pericardium: There is no evidence of pericardial effusion.   Mitral Valve: The mitral valve is normal in structure. Mild to moderate  mitral annular calcification. Trivial mitral valve regurgitation. No  evidence of mitral valve stenosis.   Tricuspid Valve: The tricuspid valve is normal in structure. Tricuspid  valve regurgitation is mild . No evidence of tricuspid  stenosis.   Aortic Valve: The aortic valve has an indeterminant number of cusps.  Aortic valve regurgitation is mild to moderate. Aortic regurgitation PHT  measures 797 msec. Aortic valve sclerosis is present, with no evidence of  aortic valve stenosis.   Pulmonic Valve: The pulmonic valve was normal in structure. Pulmonic valve  regurgitation is not visualized. No evidence of pulmonic stenosis.   Aorta: Aortic dilatation noted. There is an aneurysm involving the aortic  root measuring 46 mm. There is an aneurysm involving the ascending aorta  measuring 47 mm.   Venous: The inferior vena cava is normal in size with greater than 50%  respiratory variability, suggesting right atrial pressure of 3 mmHg.   IAS/Shunts: No atrial level shunt detected by color flow Doppler.      LEFT VENTRICLE  PLAX 2D  LVIDd:         3.90 cm         Diastology  LVIDs:         2.20 cm         LV e' medial:    10.70 cm/s  LV PW:         1.30 cm         LV E/e' medial:  8.0  LV IVS:        1.30 cm         LV e' lateral:   13.10 cm/s  LVOT diam:     2.00 cm         LV E/e' lateral: 6.5  LV SV:         87  LV SV Index:   49              2D  LVOT Area:     3.14 cm        Longitudinal  Strain                                 2D Strain GLS  -20.5 %                                 (A2C):                                 2D Strain GLS  -25.5 %                                 (A3C):                                 2D Strain GLS  -19.1 %                                 (A4C):                                 2D Strain GLS  -21.7 %                                 Avg:                                    3D Volume EF                                 LV 3D EF:    Left                                              ventricul                                              ar                                              ejection                                              fraction                                               by 3D  volume is                                              62 %.                                    3D Volume EF:                                 3D EF:        62 %                                 LV EDV:       164 ml                                 LV ESV:       62 ml                                 LV SV:        102 ml   RIGHT VENTRICLE  RV Basal diam:  2.10 cm  RV S prime:     10.80 cm/s  TAPSE (M-mode): 2.5 cm  RVSP:           24.3 mmHg   LEFT ATRIUM             Index        RIGHT ATRIUM           Index  LA diam:        3.20 cm 1.80 cm/m   RA Pressure: 3.00 mmHg  LA Vol (A2C):   78.9 ml 44.34 ml/m  RA Area:     19.40 cm  LA Vol (A4C):   61.4 ml 34.51 ml/m  RA Volume:   46.80 ml  26.30 ml/m  LA Biplane Vol: 70.8 ml 39.79 ml/m   AORTIC VALVE  LVOT Vmax:   109.00 cm/s  LVOT Vmean:  71.600 cm/s  LVOT VTI:    0.276 m  AI PHT:      797 msec     AORTA  Ao Root diam: 4.60 cm  Ao Asc diam:  4.70 cm   MITRAL VALVE               TRICUSPID VALVE  MV Area (PHT): 4.29 cm    TR Peak grad:   21.3 mmHg  MV Decel Time: 177 msec    TR Vmax:        231.00 cm/s  MV E velocity: 85.40 cm/s  Estimated RAP:  3.00 mmHg  MV A velocity: 39.80 cm/s  RVSP:           24.3 mmHg  MV E/A ratio:  2.15                             SHUNTS  Systemic VTI:  0.28 m                             Systemic Diam: 2.00 cm   Fransico Him MD  Electronically signed by Fransico Him MD  Signature Date/Time: 08/23/2021/1:27:38 PM         Final     Impression:  This 76 year old gentleman has a 4.8 to 5 cm aortic root and 4.7 cm fusiform ascending aortic aneurysm which appears stable dating back to at least 2020.  His echocardiogram shows an indeterminate number of aortic valve cusps with mild aortic insufficiency and normal LV dimensions and systolic function.  The aortic root was measured at 4.6 cm  at the sinus level on echo and the ascending aorta was measured at 4.7 cm.  His aorta is still below the surgical threshold of 5.5 cm.  I reviewed the CTA and echo images with the patient and answered his questions.  I stressed the importance of continued good blood pressure control in preventing further enlargement and acute aortic dissection.  I advised him against doing any heavy lifting that may require a Valsalva maneuver and could suddenly raise his blood pressure to high levels.  He has been followed by Dr. Clementeen Graham at [facility] for his aortic aneurysm but would like to consolidate his care to [facility].  I have recommended that he have a follow-up CT of the chest in 1 year.   Plan:  He will continue to follow-up with Dr. Radford Pax for his cardiology care and echocardiograms as needed.  I will see him back in 1 year with a CTA of the chest to follow-up on his aortic root and ascending aortic aneurysm.  I spent 40 minutes performing this consultation and > 50% of this time was spent face to face counseling and coordinating the care of this patient's aortic root and ascending aortic aneurysm with mild aortic insufficiency.   Gaye Pollack, MD [facility] [phone]

## 2021-09-24 ENCOUNTER — Other Ambulatory Visit: Payer: Self-pay | Admitting: Neurology

## 2021-09-25 NOTE — Progress Notes (Signed)
[employer] PREP Weekly Session ? ?Patient Details  ?Name: [name] ?MRN: [id number] ?Date of Birth: [birth year] ?Age: 76 y.o. ?PCP: Burman Freestone, MD ? ?Vitals:  ? 09/25/21 1327  ?Weight: 136 lb (61.7 kg)  ? ? ? [employer] Weekly seesion - 09/25/21 1300   ? ?  ? [employer] "PREP" Location  ? [facility]PREP[name]   ?  ? Weekly Session  ? Topic Discussed Healthy eating tips   2 cups fruits/veggies a day; Na intake 1500-'2300mg'$ ; Sugar 24 gm women, 36 gm men  ? Minutes exercised this week 157 minutes   ? Classes attended to date 5   ? ?  ?  ? ?  ? ? ?[place] ?09/25/2021, 1:28 PM ? ? ?

## 2021-09-26 ENCOUNTER — Encounter: Payer: Self-pay | Admitting: Adult Health

## 2021-09-27 ENCOUNTER — Other Ambulatory Visit: Payer: Self-pay | Admitting: Adult Health

## 2021-09-27 NOTE — Telephone Encounter (Signed)
[place] Ailene Ravel) need clarification on the instruction for butalbital-aspirin-caffeine-codeine ([place]) 50-325-40-30 MG capsule. How often and how many tablets to take. Would like a call back ?

## 2021-09-27 NOTE — Telephone Encounter (Signed)
Last OV was on 07/09/21.  ?Next OV is scheduled for 11/20/21 .  ?Last RX was written on 03/16/21 for 30 tabs.  ? ?Pharmacy called and verified. Ok to fill ?

## 2021-09-30 ENCOUNTER — Other Ambulatory Visit: Payer: Self-pay | Admitting: Neurology

## 2021-10-01 ENCOUNTER — Ambulatory Visit: Payer: Medicare Other | Admitting: Adult Health

## 2021-10-01 MED ORDER — BUTALBITAL-ASA-CAFF-CODEINE 50-325-40-30 MG PO CAPS: ORAL_CAPSULE | ORAL | 1 refills | Status: DC

## 2021-10-01 NOTE — Telephone Encounter (Signed)
Last OV was on 07/09/21.  ?Next OV is scheduled for 11/20/21 .  ?Last RX was written on 07/04/21 for 90 tabs.  ? ?[facility] has been reviewed.  ?

## 2021-10-02 NOTE — Progress Notes (Signed)
[facility] PRE[facility] Weekly Session ? ?Patient Details  ?Name: [name] ?MRN: [id number] ?Date of Birth: [birth year] ?Age: 76 y.o. ?PCP: Burman Freestone, MD ? ?Vitals:  ? 10/02/21 1420  ?Weight: 138 lb (62.6 kg)  ? ? ? [facility] seesion - 10/02/21 1400   ? ?  ? [facility]  ? [name]   ?  ? Weekly Session  ? Topic Discussed Health habits   Reviewed week 4 upper and lower body machines today in anticipation of instructor LOA for next 2 weeks; encouraged to keep attending Y at least twice a week for cardio/strength training with [facility] staff assistance.  ? Minutes exercised this week 155 minutes   ? Classes attended to date 7   ? ?  ?  ? ?  ?Reviewed week 4 upper and lower body machines today in anticipation of instructor LOA for next 2 weeks; encouraged to keep attending Y at least twice a week for cardio/strength training with [facility] staff assistance. ? ?Yevonne Aline ?10/02/2021, 2:20 PM ? ? ?

## 2021-10-23 NOTE — Progress Notes (Signed)
[facility] PRE[facility] Weekly Session ? ?Patient Details  ?Name: DREON PINEDA ?MRN: [id number] ?Date of Birth: Oct 24, 1945 ?Age: 76 y.o. ?PCP: Burman Freestone, MD ? ?Vitals:  ? 10/23/21 1454  ?Weight: 135 lb (61.2 kg)  ? ? ? [facility] Weekly seesion - 10/23/21 1400   ? ?  ? [facility]  ? [name]   ?  ? Weekly Session  ? Topic Discussed Restaurant Eating   Salt demo, low sodium flavoring tips; nutrition label review  ? Minutes exercised this week 300 minutes   ? Classes attended to date 46   ? ?  ?  ? ?  ? ? ?[place] ?10/23/2021, 2:58 PM ? ? ?

## 2021-10-30 ENCOUNTER — Encounter: Payer: Self-pay | Admitting: Cardiology

## 2021-10-30 ENCOUNTER — Ambulatory Visit (INDEPENDENT_AMBULATORY_CARE_PROVIDER_SITE_OTHER): Payer: Medicare Other | Admitting: Cardiology

## 2021-10-30 VITALS — BP 126/68 | HR 61 | Ht 67.0 in | Wt 140.8 lb

## 2021-10-30 DIAGNOSIS — I10 Essential (primary) hypertension: Secondary | ICD-10-CM | POA: Diagnosis not present

## 2021-10-30 DIAGNOSIS — G4733 Obstructive sleep apnea (adult) (pediatric): Secondary | ICD-10-CM

## 2021-10-30 DIAGNOSIS — I4819 Other persistent atrial fibrillation: Secondary | ICD-10-CM

## 2021-10-30 DIAGNOSIS — I2510 Atherosclerotic heart disease of native coronary artery without angina pectoris: Secondary | ICD-10-CM

## 2021-10-30 DIAGNOSIS — I483 Typical atrial flutter: Secondary | ICD-10-CM

## 2021-10-30 NOTE — Progress Notes (Signed)
? ?Electrophysiology Office Note ? ? ?Date:  10/30/2021  ? ?ID:  [name], DOB 15-Jun-1946, MRN [id number] ? ?PCP:  Burman Freestone, MD  ?Cardiologist: Radford Pax ?Primary Electrophysiologist:  [place] Meredith Leeds, MD   ? ?Chief Complaint: Atrial flutter ?  ?History of Present Illness: ?[name] is a 76 y.o. male who is being seen today for the evaluation of atrial flutter at the request of Burman Freestone, MD. Presenting today for electrophysiology evaluation. ? ?He has a history significant for coronary artery disease status post CABG in 1998, bicuspid aortic valve with moderate to severe AI, dilated aortic root, hypertension, hyperlipidemia, OSA, atrial flutter.  He is status post ablation for atrial flutter on 09/29/2019.  He presented back to cardiology clinic in atrial fibrillation.  He is now status post atrial fibrillation ablation 08/02/2021. ? ?Today, denies symptoms of palpitations, chest pain, shortness of breath, orthopnea, PND, lower extremity edema, claudication, dizziness, presyncope, syncope, bleeding, or neurologic sequela. The patient is tolerating medications without difficulties.  His ablation he has done well.  He has had no further episodes of atrial fibrillation.  He is overall happy with his control.  He is ready to get off of amiodarone. ? ? ?Past Medical History:  ?Diagnosis Date  ? Anal fissure   ? Aortic insufficiency   ? Arthritis   ? Atrial flutter ([place])   ? Bicuspid aortic valve   ? with mild to moderate AR by echo 08/2019  ? BPH (benign prostatic hyperplasia)   ? CAD (coronary artery disease)   ? s/p CABG [place]  ? Cataract   ? bilateral-removed  ? CKD (chronic kidney disease), stage III ([place])   ? Depression   ? Dilated aortic root ([place])   ? History of blood transfusion 1998  ? "due to cardiac bypass surgery"  ? Hyperlipidemia   ? Hypertension   ? Hypogonadism in male   ? Kidney stones   ? Migraine   ? Mild carotid artery disease ([place]) 2009  ? Myocardial  infarction [facility])   ? PVC's (premature ventricular contractions)   ? Retinal hemorrhage 11/04/2014  ? Sleep apnea with use of continuous positive airway pressure (CPAP)   ? Thoracic aortic aneurysm (TAA) ([place])   ? ?Past Surgical History:  ?Procedure Laterality Date  ? A-FLUTTER ABLATION N/A 09/29/2019  ? Procedure: A-FLUTTER ABLATION;  Surgeon: Constance Haw, MD;  Location: [place] [facility];  Service: Cardiovascular;  Laterality: N/A;  ? ATRIAL FIBRILLATION ABLATION N/A 08/02/2021  ? Procedure: ATRIAL FIBRILLATION ABLATION;  Surgeon: Constance Haw, MD;  Location: [place] [facility];  Service: Cardiovascular;  Laterality: N/A;  ? CARDIOVERSION N/A 08/10/2019  ? Procedure: CARDIOVERSION;  Surgeon: Elouise Munroe, MD;  Location: [place];  Service: Cardiovascular;  Laterality: N/A;  ? CARDIOVERSION N/A 05/21/2021  ? Procedure: CARDIOVERSION;  Surgeon: Josue Hector, MD;  Location: [facility] ENDOSCOPY;  Service: Cardiovascular;  Laterality: N/A;  ? CARDIOVERSION N/A 07/12/2021  ? Procedure: CARDIOVERSION;  Surgeon: Jerline Pain, MD;  Location: [facility] ENDOSCOPY;  Service: Cardiovascular;  Laterality: N/A;  ? cataracts    ? COLONOSCOPY    ? CORONARY ARTERY BYPASS GRAFT  1998  ? LEFT HEART CATH AND CORONARY ANGIOGRAPHY N/A 08/24/2018  ? Procedure: LEFT HEART CATH AND CORONARY ANGIOGRAPHY;  Surgeon: Lorretta Harp, MD;  Location: [place] [facility];  Service: Cardiovascular;  Laterality: N/A;  ? TEE WITHOUT CARDIOVERSION N/A 01/19/2015  ? Procedure: TRANSESOPHAGEAL ECHOCARDIOGRAM (TEE);  Surgeon: Sueanne Margarita, MD;  Location: [place];  Service: Cardiovascular;  Laterality: N/A;  ? [place]  ? pt denies  ? ? ? ?Current Outpatient Medications  ?Medication Sig Dispense Refill  ? ALPRAZOLAM XR 1 MG 24 hr tablet TAKE ONE TABLET BY MOUTH DAILY 90 tablet 0  ? amLODipine (NORVASC) 5 MG tablet Take 1.5 tablets (7.5 mg total) by mouth daily. 135 tablet 3  ? apixaban (ELIQUIS) 5 MG TABS tablet Take  1 tablet (5 mg total) by mouth 2 (two) times daily. 60 tablet 11  ? Ascorbic Acid (VITAMIN C) 1000 MG tablet Take 1,000 mg by mouth 2 (two) times daily.    ? buPROPion (WELLBUTRIN XL) 300 MG 24 hr tablet Take 300 mg by mouth daily.    ? butalbital-aspirin-caffeine-codeine (FIORINAL WITH CODEINE) 50-325-40-30 MG capsule USE AS NEEDED FOR BREAK THROUGH MIGRAINE, may take max 2 capsules in 24 hours, max 15 in 30 days. 15 capsule 1  ? diazepam (VALIUM) 5 MG tablet Take 2.5-5 mg by mouth 2 (two) times daily as needed for anxiety.     ? furosemide (LASIX) 20 MG tablet Take 1 tablet (20 mg total) by mouth daily. 90 tablet 3  ? ibuprofen (ADVIL,MOTRIN) 200 MG tablet Take 200-400 mg by mouth every 8 (eight) hours as needed (for arthritis in the feet).    ? losartan (COZAAR) 25 MG tablet Take 1 tablet (25 mg total) by mouth daily. 90 tablet 3  ? Nebivolol HCl (BYSTOLIC) 20 MG TABS Take 1 tablet (20 mg total) by mouth daily. 90 tablet 3  ? niacin 500 MG tablet Take 500 mg by mouth at bedtime.    ? rosuvastatin (CRESTOR) 20 MG tablet Take 1 tablet (20 mg total) by mouth daily. 90 tablet 3  ? Specialty Vitamins Products (CENTRUM SPECIALIST ENERGY) TABS Take 1 tablet by mouth daily with breakfast.    ? tadalafil (CIALIS) 5 MG tablet Take 5 mg by mouth every evening.    ? Testosterone 40.5 MG/2.5GM (1.62%) GEL Place 4 Pump onto the skin daily. Apply 4 pumps to each shoulder every morning    ? traZODone (DESYREL) 50 MG tablet Take 1.5 tablets (75 mg total) by mouth at bedtime. 135 tablet 3  ? Turmeric 500 MG CAPS Take 500 mg by mouth daily.    ? vitamin E 400 UNIT capsule Take 400 Units by mouth at bedtime.     ? ?No current facility-administered medications for this visit.  ? ? ?Allergies:   Dog epithelium allergy skin test and Mushroom extract complex  ? ?Social History:  The patient  reports that he quit smoking about 35 years ago. His smoking use included cigarettes. He has never used smokeless tobacco. He reports current  alcohol use of about 1.0 standard drink per week. He reports that he does not use drugs.  ? ?Family History:  The patient's family history includes Arthritis in his father and mother; Diabetes in his father and mother; Heart disease (age of onset: 50) in his father; Hyperlipidemia in his father and mother; Hypertension in his father; Kidney disease in his paternal grandfather; Stroke (age of onset: 37) in his father.  ? ?ROS:  Please see the history of present illness.   Otherwise, review of systems is positive for none.   All other systems are reviewed and negative.  ? ?PHYSICAL EXAM: ?VS:  BP 126/68   Pulse 61   Ht '5\' 7"'$  (1.702 m)   Wt 140  lb 12.8 oz (63.9 kg)   SpO2 97%   BMI 22.05 kg/m?  , BMI Body mass index is 22.05 kg/m?. ?GEN: Well nourished, well developed, in no acute distress  ?HEENT: normal  ?Neck: no JVD, carotid bruits, or masses ?Cardiac: Irregular, tachycardic; no murmurs, rubs, or gallops,no edema  ?Respiratory:  clear to auscultation bilaterally, normal work of breathing ?GI: soft, nontender, nondistended, + BS ?MS: no deformity or atrophy  ?Skin: warm and dry ?Neuro:  Strength and sensation are intact ?Psych: euthymic mood, full affect ? ?EKG:  EKG is ordered today. ?Personal review of the ekg ordered shows atrial fibrillation ? ?Recent Labs: ?04/27/2021: TSH 2.760 ?07/17/2021: Magnesium 2.4; NT-Pro BNP 949 ?08/02/2021: Hemoglobin 13.4; Platelets 197 ?08/28/2021: ALT 25 ?09/07/2021: BUN 25; Creatinine, Ser 1.66; Potassium 4.2; Sodium 138  ? ? ?Lipid Panel  ?   ?Component Value Date/Time  ? CHOL 86 (L) 08/28/2021 0848  ? TRIG 83 08/28/2021 0848  ? HDL 46 08/28/2021 0848  ? CHOLHDL 1.9 08/28/2021 0848  ? CHOLHDL 2.8 08/22/2018 0645  ? VLDL 12 08/22/2018 0645  ? [place] 23 08/28/2021 0848  ? ? ? ?Wt Readings from Last 3 Encounters:  ?10/30/21 140 lb 12.8 oz (63.9 kg)  ?10/30/21 138 lb (62.6 kg)  ?10/23/21 135 lb (61.2 kg)  ?  ? ? ?Other studies Reviewed: ?Additional studies/ records that were  reviewed today include: TTE 08/23/2021 ?Review of the above records today demonstrates:  ? 1. Left ventricular ejection fraction, by estimation, is 60 to 65%. Left  ?ventricular ejection fraction by 3D volume is 62 %. The left

## 2021-10-30 NOTE — Progress Notes (Signed)
[facility] PREP Weekly Session ? ?Patient Details  ?Name: NIMESH RIOLO ?MRN: [id number] ?Date of Birth: 1945-08-19 ?Age: 76 y.o. ?PCP: Burman Freestone, MD ? ?Vitals:  ? 10/30/21 1329  ?Weight: 138 lb (62.6 kg)  ? ? ? [facility] Weekly seesion - 10/30/21 1300   ? ?  ? [facility] "PREP[facility]  ? [name]   ?  ? Weekly Session  ? Topic Discussed Stress management and problem solving   importance of sleep; finger tip breathwork mudra; shared healthy lifestyle meditation  ? Minutes exercised this week 230 minutes   ? Classes attended to date 100   ? ?  ?  ? ?  ? ? ?[place] ?10/30/2021, 1:29 PM ? ? ?

## 2021-10-30 NOTE — Patient Instructions (Addendum)
Medication Instructions:  ?Stop Amiodarone  ?Your physician recommends that you continue on your current medications as directed. Please refer to the Current Medication list given to you today. ?*If you need a refill on your cardiac medications before your next appointment, please call your pharmacy* ? ?Lab Work: ?None. ?If you have labs (blood work) drawn today and your tests are completely normal, you will receive your results only by: ?MyChart Message (if you have MyChart) OR ?A paper copy in the mail ?If you have any lab test that is abnormal or we need to change your treatment, we will call you to review the results. ? ?Testing/Procedures: ?None. ? ?Follow-Up: ?At [facility], you and your health needs are our priority.  As part of our continuing mission to provide you with exceptional heart care, we have created designated Provider Care Teams.  These Care Teams include your primary Cardiologist (physician) and Advanced Practice Providers (APPs -  Physician Assistants and Nurse Practitioners) who all work together to provide you with the care you need, when you need it. ? ?Your physician wants you to follow-up in: 3 months with one of the following Advanced Practice Providers on your designated Care Team:   ? ?Tommye Standard, PA-C ?Legrand Como "Jonni Sanger" [place], PA-C ? ?We recommend signing up for the patient portal called "MyChart".  Sign up information is provided on this After Visit Summary.  MyChart is used to connect with patients for Virtual Visits (Telemedicine).  Patients are able to view lab/test results, encounter notes, upcoming appointments, etc.  Non-urgent messages can be sent to your provider as well.   ?To learn more about what you can do with MyChart, go to [email or web address].   ? ?Any Other Special Instructions Will Be Listed Below (If Applicable). ? ? ? ? ?  ? ? ?

## 2021-11-06 NOTE — Progress Notes (Signed)
[facility] PREP Weekly Session ? ?Patient Details  ?Name: [name] ?MRN: [id number] ?Date of Birth: 1945-08-28 ?Age: 76 y.o. ?PCP: Burman Freestone, MD ? ?Vitals:  ? 11/06/21 1611  ?Weight: 134 lb (60.8 kg)  ? ? ? [facility] Weekly seesion - 11/06/21 1600   ? ?  ? [facility] "PREP[facility]  ? [name]   ?  ? Weekly Session  ? Topic Discussed Expectations and non-scale victories   Staying positive; 5 lb. muscle/fat comparison; Revisit/review/reset goals halfway through program  ? Minutes exercised this week 230 minutes   ? Classes attended to date 60   ? ?  ?  ? ?  ? ? ?[place] ?11/06/2021, 4:12 PM ? ? ?

## 2021-11-13 NOTE — Progress Notes (Signed)
[facility] PREP Weekly Session ? ?Patient Details  ?Name: [name] ?MRN: [id number] ?Date of Birth: 1945/10/13 ?Age: 76 y.o. ?PCP: Burman Freestone, MD ? ?Vitals:  ? 11/13/21 1317  ?Weight: 135 lb (61.2 kg)  ? ? ? [facility] Weekly seesion - 11/13/21 1300   ? ?  ? [facility] "PREP" Location  ? [name]   ?  ? Weekly Session  ? Topic Discussed Other   Portion Control; visualize your portion size demo; label review reduced sugar Craisins; homework: to bring in nutrition label from home next week to review with group.  ? Minutes exercised this week 225 minutes   ? Classes attended to date 59   ? ?  ?  ? ?  ? ? ?[place] ?11/13/2021, 1:18 PM ? ? ?

## 2021-11-20 ENCOUNTER — Telehealth: Payer: Self-pay | Admitting: Neurology

## 2021-11-20 ENCOUNTER — Encounter: Payer: Self-pay | Admitting: Neurology

## 2021-11-20 ENCOUNTER — Ambulatory Visit (INDEPENDENT_AMBULATORY_CARE_PROVIDER_SITE_OTHER): Payer: Medicare Other | Admitting: Neurology

## 2021-11-20 ENCOUNTER — Other Ambulatory Visit: Payer: Self-pay | Admitting: Neurology

## 2021-11-20 VITALS — BP 135/67 | HR 62 | Ht 67.0 in | Wt 143.5 lb

## 2021-11-20 DIAGNOSIS — R519 Headache, unspecified: Secondary | ICD-10-CM | POA: Diagnosis not present

## 2021-11-20 DIAGNOSIS — G4700 Insomnia, unspecified: Secondary | ICD-10-CM | POA: Diagnosis not present

## 2021-11-20 DIAGNOSIS — I2510 Atherosclerotic heart disease of native coronary artery without angina pectoris: Secondary | ICD-10-CM | POA: Diagnosis not present

## 2021-11-20 DIAGNOSIS — G4731 Primary central sleep apnea: Secondary | ICD-10-CM | POA: Insufficient documentation

## 2021-11-20 DIAGNOSIS — Z9989 Dependence on other enabling machines and devices: Secondary | ICD-10-CM | POA: Diagnosis not present

## 2021-11-20 MED ORDER — SUVOREXANT 5 MG PO TABS: 5.0000 mg | ORAL_TABLET | Freq: Every day | ORAL | 5 refills | Status: DC

## 2021-11-20 MED ORDER — BUTALBITAL-APAP-CAFFEINE 50-325-40 MG PO TABS: 1.0000 | ORAL_TABLET | Freq: Every day | ORAL | 5 refills | Status: DC | PRN

## 2021-11-20 MED ORDER — BUTALBITAL-ASA-CAFF-CODEINE 50-325-40-30 MG PO CAPS: ORAL_CAPSULE | ORAL | 0 refills | Status: DC

## 2021-11-20 NOTE — Patient Instructions (Addendum)
Suvorexant Tablets ?What is this medication? ?SUVOREXANT (SOO voe REX ant) treats insomnia. It helps you go to sleep faster and stay asleep through the night. ?This medicine may be used for other purposes; ask your health care provider or pharmacist if you have questions. ?COMMON BRAND NAME(S): Belsomra ?What should I tell my care team before I take this medication? ?They need to know if you have any of these conditions: ?Depression ?History of substance abuse or addiction ?History of a sudden onset of muscle weakness (cataplexy) ?History of falling asleep often at unexpected times (narcolepsy) ?If you often drink alcohol ?Liver disease ?Lung or breathing disease ?Sleep apnea ?Sleep-walking, driving, eating or other activity while not fully awake after taking a sleep medication ?Suicidal thoughts, plans, or attempt; a previous suicide attempt by you or a family member ?An unusual or allergic reaction to suvorexant, other medications, foods, dyes, or preservatives ?Pregnant or trying to get pregnant ?Breast-feeding ?How should I use this medication? ?Take this medication by mouth with water 30 minutes before going to bed. Follow the directions on the prescription label. It is better to take this medication on an empty stomach. Do not take your medication more often than directed. ?A special MedGuide will be given to you by the pharmacist with each prescription and refill. Be sure to read this information carefully each time. ?Talk to your care team about the use of this medication in children. Special care may be needed. ?Overdosage: If you think you have taken too much of this medicine contact a poison control center or emergency room at once. ?NOTE: This medicine is only for you. Do not share this medicine with others. ?What if I miss a dose? ?This does not apply. This medication should only be taken as directed before going to sleep. Do not take double or extra doses. ?What may interact with this  medication? ?Alcohol ?Antihistamines for allergy, cough, or cold ?Certain antibiotics, such as erythromycin or clarithromycin ?Certain antivirals for HIV or hepatitis ?Certain medications for fungal infections, such as itraconazole, ketoconazole, posaconazole ?Certain medications for mental health conditions ?Certain medications for seizures, such as carbamazepine, phenytoin, phenobarbital ?Conivaptan ?Diltiazem ?General anesthetics, such as halothane, isoflurane, methoxyflurane, propofol ?Grapefruit juice ?Medications that relax muscles for surgery ?Opioid medications for pain ?Other medications for sleep ?Rifampin ?Viburnum ?Verapamil ?This list may not describe all possible interactions. Give your health care provider a list of all the medicines, herbs, non-prescription drugs, or dietary supplements you use. Also tell them if you smoke, drink alcohol, or use illegal drugs. Some items may interact with your medicine. ?What should I watch for while using this medication? ?Visit your care team for regular checks on your progress. Keep a regular sleep schedule by going to bed at about the same time each night. Avoid caffeine-containing drinks in the evening hours. Talk to your care team if your insomnia worsens or is not better within 7 to 10 days. ?After taking this medication, you may get up out of bed and do an activity that you do not know you are doing. The next morning, you may have no memory of this. Activities include driving a car ("sleep-driving"), making and eating food, talking on the phone, sexual activity, and sleep-walking. Serious injuries have occurred. Stop the medication and call your care team right away if you find out you have done any of these activities. Do not take this medication if you have used alcohol that evening. Do not take it if you have taken another  medication for sleep. The risk of doing these sleep-related activities is higher. ?Do not take this medication unless you are  able to stay in bed for a full night (7 to 8 hours) before you must be active again. Tell your care team if you will need to perform activities requiring full alertness, such as driving, the next day. You may have a decrease in mental alertness the day after use, even if you feel that you are fully awake. Do not stand or sit up quickly after taking this medication, especially if you are an older patient. This reduces the risk of dizzy or fainting spells. ?If you or your family notice any changes in your moods or behavior, such as new or worsening depression, thoughts of harming yourself, anxiety, other unusual or disturbing thoughts, or memory loss, call your care team right away. ?After you stop taking this medication, you may have trouble falling asleep. This is called rebound insomnia. This problem usually goes away on its own after 1 or 2 nights. ?What side effects may I notice from receiving this medication? ?Side effects that you should report to your care team as soon as possible: ?Allergic reactions--skin rash, itching, hives, swelling of the face, lips, tongue, or throat ?CNS depression--slow or shallow breathing, shortness of breath, feeling faint, dizziness, confusion, trouble staying awake ?Mood and behavior changes--anxiety, nervousness, confusion, hallucinations, irritability, hostility, thoughts of suicide or self-harm, worsening mood, feelings of depression ?Sudden and temporary muscle weakness ?Unable to move or speak for several minutes upon waking or going to sleep ?Unusual sleep behaviors or activities you do not remember, such as driving, eating, or sexual activity ?Side effects that usually do not require medical attention (report these to your care team if they continue or are bothersome): ?Drowsiness the day after use ?Vivid dreams or nightmares ?This list may not describe all possible side effects. Call your doctor for medical advice about side effects. You may report side effects to FDA at  1-800-FDA-1088. ?Where should I keep my medication? ?Keep out of the reach of children and pets. This medication can be abused. Keep it in a safe place to protect it from theft. Do not share it with anyone. It is only for you. Selling or giving away this medication is dangerous and against the law. ?Store between 20 and 25 degrees C (68 and 77 degrees F). Protect from light and moisture. Keep the container tightly closed. Get rid of any unused medication after the expiration date. ?This medication may cause harm and death if it is taken by other adults, children, or pets. It is important to get rid of the medication as soon as you no longer need it or it is expired. You can do this in two ways: ?Take the medication to a medication take-back program. Check with your pharmacy or law enforcement to find a location. ?If you cannot return the medication, check the label or package insert to see if the medication should be thrown out in the garbage or flushed down the toilet. If you are not sure, ask your care team. If it is safe to put it in the trash, take the medication out of the container. Mix the medication with cat litter, dirt, coffee grounds, or other unwanted substance. Seal the mixture in a bag or container. Put it in the trash. ?NOTE: This sheet is a summary. It may not cover all possible information. If you have questions about this medicine, talk to your doctor, pharmacist, or health care provider. ??  2023 Elsevier/Gold Standard (2021-03-01 00:00:00) ?Lemborexant Tablets ?What is this medication? ?[employer] (LEM boe REX ant) treats insomnia. It helps you go to sleep faster and stay asleep through the night. It is often used for a short period of time. ?This medicine may be used for other purposes; ask your health care provider or pharmacist if you have questions. ?COMMON BRAND NAME(S): DAYVIGO ?What should I tell my care team before I take this medication? ?They need to know if you have any of these  conditions: ?Depression ?Frequently drink alcohol ?History of falling asleep often at unexpected times (narcolepsy) ?History of substance use disorder ?History of a sudden onset of muscle weakness (cataplexy) ?Liver disease ?

## 2021-11-20 NOTE — Addendum Note (Signed)
Addended by: Gerline Legacy C on: 11/20/2021 11:10 AM ? ? Modules accepted: Orders ? ?

## 2021-11-20 NOTE — Progress Notes (Signed)
?SLEEP MEDICINE CLINIC ? ? ?Provider:  Carmen  Dohmeier, M D  ?Referring Provider: Woodyear, Wynne E, MD ?Primary Care Physician:  Woodyear, Wynne E, MD ? ?Chief Complaint  ?Patient presents with  ? Obstructive Sleep Apnea  ?  Rm 11, alone. Here for initial CPAP for new machine. Pt would like to discuss medication management.   ? ? ?HPI:  [name] is a 75 y.o. male  And is seen on 11-20-2021: ? ?I have the pleasure of seeing [name] again he underwent a home sleep test in June 2022 which confirmed the presence of sleep apnea.  He has been a very compliant user of CPAP and his home sleep test confirmed an baseline AHI of 44.1, minimal desaturation time of only 2.5 minutes, a trend towards bradycardia with heart rates between 46 and 83 bpm and a reduced amount of REM sleep about 4% at night.  His CPAP machine was over 5 years old and was replaced following this home sleep test with an autotitrator the settings between 6 and 16 cmH2O with 3 cm expiratory pressure relief and a mask of his choice and heated humidification.  [name] has continued to be a compliant CPAP user on his new AutoPap this is still an AirSense 10 AutoSet not an air sense 11 but it was set to 7 cm water pressure with 3 cm EPR.  100% compliant and the AHI is 2.1/h which is very good.  I am not sure that he did not get a new machine or if this is his new machine.  This machine actually was issued in January of this year.  So from his apnea treatment I am happy and I can see that his fatigue score was endorsed at only 15 points on his Epworth sleepiness score at one-point unchanged to prior evaluations.  In addition he has stated that his nocturia is much improved related to a daily dose of low-dose Cialis 5 mg.  This has treated his nocturia.  Nocturia was a major contributor to his fragmentation of sleep. ? ?He also would like to address his headaches today: random onset. Not diurnal- when we first met he had 15 /month. Now he has only  headaches 4-5 month of lesser severity on CPAP. ?Butalbutal used 30 tabs over 12 month- very reasonable.  ? ?Insomnia. Trazodone helps only limited with sleep induction and may cause heart rate slowing- interested in a medication that helps to go to sleep and stay asleep- and only 8-10 days a month.   ?Dr Woodyear has filled diazepam. He uses it sparingly as well. It was a started after 3 psychiatry visits with [facility] for evaluation of abuse potential.  ?We discussed the newer drug class of REXANTS. Lemborexant, Suvorexant . Will add that 5mg dose of suvorexant and  and drop Trazodone.  ? ? ? ? ? ? ?Last visit was in with Megan Millikan , NP  ?11-15-2020,  a RV;  ? [name] is a meanwhile 74-year-old Caucasian gentleman whom I have followed now for about 5 years.  ?[name] reports that his headache has been much better under control he is actually very happy about this aspect of his neurological health.  He has lost some weight since December 2021 and his BMI is now 22.4.  He is using zonisamide as a headache preventative and for acute breakthrough headaches he will use the occasional Fiorinal. ?[facility] has concurred that his migraine and tinnitus, hearing loss are service related disability. ?He   used to serve in the [employer].  He certainly was exposed to very loud noises.  His sleep has always changed since after that.  He during [employer] he was treated with some elbows he used to use [employer], Talwin, Darvin, Darvocet, fentanyl, Percodan, morphine, Percocet, oxycodone, hydrocodone, Demerol injections, and no Fiorinal as needed so there have been various opiates prescribed, time of 30 years ago but currently he is only on Fiorinal as needed and a prescription lasts him for over 90 days.  I do not think he has a history of opiate addiction. ? ? ?[name] also had related to me that his urologist felt the use of Xanax 0.5 mg or 1 mg at night may contribute to some sexual  problems.  A reduction from 1 mg to half milligram over a period of 2 months did not change his concerns and regarding to orgasm delay.  He has resumed some 1 mg he is actually reporting that sometimes a 1 mg dose is not alone able to get him to sleep but he also acknowledges that there are some additional emotional stressors on those nights that Xanax just cannot treat "".  Discussing to add Belsomra and use the alprazolam more for anxiety than 4 insomnia I hope that the switch will be well-tolerated. ? ?The patient has a history of sleep apnea he is 100% compliant CPAP user his CPAP is set at a pressure of 10 cmH2O there is 3 cm EPR and his residual AHI is 2.6 he does have some higher air leakage overall. ?He has well-controlled and he had 2% of the nights Cheyne-Stokes respirations.  This is a setting he tolerates well as of 7-1/2 hours of nightly use but also for an improved tolerance of CPAP on FFM . He has a beard - he can't get a seal with that.   I would like to replace the machine soon because it will be 74 years old as of October 2022 and usually the manufacturer will discontinue supporting the software after 5 years  My goal would be to just obtain a home sleep test 1 night at home without CPAP and use to have a baseline and then order a new machine based on what we find. ? ? I would like for [name] to use his medication as he usually does so we see the effect on apnea that it may have.  ? ? ? ? ? ? ?06-05-2016 ?He presented with difficulties initiating and maintaining sleep was tested for obstructive sleep apnea and the results were positive.  He was quite apprehensive initially starting on CPAP but has meanwhile mastered the use very well and has benefited greatly.  He has become a very compliant patient 97% for the last 30 days, 8 hours of average nighttime use, with a CPAP set at 10 cmH2O with 3 cm EPR his residual AHI was 4.0 of which 2.1 apneas are obstructive in nature.  He does have some  central apnea arising under treatment about 3% of the night.  For this reason I would not like to increase his pressure further. ?In cooperation with his counselor I have also provided alprazolam 1 mg extended release which has helped with his chronic insomnia.  He has never abused the medication, and is not asking for early refills.  Occasionally he will suffer a headache that is best responding to butalbital, here again the last prescription is from July 28, 2017 and at this  time he still has 10 of his 30 pills left. I migraine a week from 4 a week before treatment.  ?The patient is also a [facility] patient - and I wrote a letter to [employer], and today we go through a questionnaire he was send.  ? ?Originally seen here as a referral from Dr. Sarah Redding -Whitehead, MD at [facility] for a sleep evaluation, on 05-07-2016 ?[name] has a history of hypercholesterolemia of heart disease, migraines, anxiety and hypertension he had undergone a cardiac bypass surgery in 1998 at a rather young age, and most recently had surgery for the replacement of cloudy lenses, both eyes had cataract surgery in 2013 at 2014. He describes insomnia and headaches as well as anxiety and hearing loss. He is to establish himself as a new neurologist and to see if his headache history is correlated with his sleep disturbance.  ?He has an aortic aneurysm, arthritis, Aortic valve regurgitation.  ?The patient reports that headaches prevent him from going to sleep but they do not wake him up and then not always present when he wakes up. ?He was originally diagnosed in college with migrainous headaches 50 years ago, these headaches are intense, sometimes associated with a visual aura, almost always nausea to the level of emesis was present. Migraines may have lasted for longer than 24 hours up to 3 days in its worst kind. He had photophobia, phonophobia but no olfactory triggers.He was treated with powerful narcotics, such as Talwin, Mepergan  forte, Percodan, Percocet and nasal sprays . He would have as many as 15 migraines with hospital admission per semester, affecting his social and professional life. After his bypass surgery in 1998 he experien

## 2021-11-20 NOTE — Progress Notes (Signed)
[facility] PREP Weekly Session ? ?Patient Details  ?Name: [name] ?MRN: [id number] ?Date of Birth: [birth year] ?Age: 76 y.o. ?PCP: Burman Freestone, MD ? ?Vitals:  ? 11/20/21 1309  ?Weight: 137 lb (62.1 kg)  ? ? ? [facility] Weekly seesion - 11/20/21 1300   ? ?  ? [facility]PREP[facility] Location  ? [name]   ?  ? Weekly Session  ? Topic Discussed Finding support   Label review, reading and using [facility] app  ? Minutes exercised this week 246 minutes   ? Classes attended to date 67   ? ?  ?  ? ?  ? ? ?[place] ?11/20/2021, 1:10 PM ? ? ?

## 2021-11-21 NOTE — Telephone Encounter (Signed)
Submitted PA Fioricet on CMM. Key: [id number]. PA approved 07/22/2021 - 07/21/2022.  ?

## 2021-11-26 ENCOUNTER — Telehealth: Payer: Self-pay | Admitting: Neurology

## 2021-11-26 NOTE — Telephone Encounter (Signed)
done

## 2021-11-26 NOTE — Telephone Encounter (Signed)
Kristen from [facility] services requesting office visit notes from pt appt on 11/20/2021. ?Fax: [phone] ?Phone : [phone] ? ?

## 2021-11-27 NOTE — Progress Notes (Signed)
[facility] PREP Weekly Session ? ?Patient Details  ?Name: [name] ?MRN: [id number] ?Date of Birth: [birth year] ?Age: 76 y.o. ?PCP: Burman Freestone, MD ? ?Vitals:  ? 11/27/21 1307  ?Weight: 139 lb (63 kg)  ? ? ? [facility] Weekly seesion - 11/27/21 1300   ? ?  ? [facility]PREP[facility] Location  ? [name]   ?  ? Weekly Session  ? Topic Discussed Calorie breakdown   Importance of fats, proteins, and carbohydrates; simple vs complex carbs  ? Minutes exercised this week 213 minutes   ? Classes attended to date 64   ? ?  ?  ? ?  ? ? ?[name] Kamille Toomey ?11/27/2021, 1:10 PM ? ? ?

## 2021-11-28 ENCOUNTER — Telehealth: Payer: Self-pay

## 2021-11-28 ENCOUNTER — Telehealth: Payer: Self-pay | Admitting: Cardiology

## 2021-11-28 NOTE — Telephone Encounter (Signed)
Patient calling to say that he has pain in chest that runs down to his abdomen. Please advise  ?

## 2021-11-28 NOTE — Telephone Encounter (Signed)
Received  call from pt re: having small "bump" between belly button and sternum, advised if he needed to call heart doctor or gastro enterologist; encouraged to call both, but cardiologist first given extensive heart history.  ?

## 2021-11-28 NOTE — Telephone Encounter (Signed)
Created in error, see 12:35 note ?

## 2021-11-28 NOTE — Telephone Encounter (Signed)
Spoke with pt who complains of a painful "knot" about 5 inches above his belly button.  Pt states it is soft and about 1.5 inches in size.  Pt states pain starts where the knot is and runs down his abdomen.  Pt denies current CP, SOB or dizziness/fainting.   ?Pt advised to contact his PCP for further evaluation and recommendation.  Reviewed ED precautions.  Pt verbalizes understanding and agrees with current plan. ?

## 2021-12-04 NOTE — Progress Notes (Signed)
[facility] PREP Weekly Session ? ?Patient Details  ?Name: [name] ?MRN: [id number] ?Date of Birth: Nov 24, 1945 ?Age: 76 y.o. ?PCP: Burman Freestone, MD ? ?Vitals:  ? 12/04/21 1300  ?Weight: 138 lb (62.6 kg)  ? ? ? [facility] Weekly seesion - 12/04/21 1300   ? ?  ? [facility]PREP" Location  ? [facility]PREP[name]   ?  ? Weekly Session  ? Topic Discussed Hitting roadblocks   Membership talk with Sandi Mealy, goals and activity plan for next 90 days  ? Minutes exercised this week 218 minutes   ? Classes attended to date 37   ? ?  ?  ? ?  ? ? ?[place] ?12/04/2021, 1:01 PM ? ? ?

## 2021-12-10 ENCOUNTER — Ambulatory Visit: Payer: Medicare Other | Admitting: Neurology

## 2021-12-11 NOTE — Progress Notes (Signed)
[facility] PREP Weekly Session  Patient Details  Name: [name] MRN: [id number] Date of Birth: 1946-07-15 Age: 76 y.o. PCP: Burman Freestone, MD  Vitals:   12/11/21 1308  Weight: 139 lb (63 kg)     [facility] Weekly seesion - 12/11/21 1300       [facility] "PREP" Location   [facility] "PREP" Location [name]      Weekly Session   Topic Discussed Other   How fit and strong survey completed; fit testing done; final assessment vist scheduled for Thursday 5/25 at noon   Minutes exercised this week 315 minutes    Classes attended to date [place] 12/11/2021, 1:10 PM

## 2021-12-13 NOTE — Progress Notes (Signed)
[facility] PREP Evaluation  Patient Details  Name: [name] MRN: [id number] Date of Birth: Jan 10, 1946 Age: 76 y.o. PCP: Burman Freestone, MD  Vitals:   12/13/21 1244  BP: 120/60  Pulse: 62  SpO2: 99%  Weight: 144 lb (65.3 kg)     [facility] Eval - 12/13/21 1200       [facility] "PREP" Location   [facility] "PREP" Location [facility]      Referral    Referring Provider R. Fenton    Program Start Date 12/13/21   Final Assessment Visit     Measurement   Neck measurement 15.5 Inches    Body fat 13.7 percent      Mobility and Daily Activities   I find it easy to walk up or down two or more flights of stairs. 4    I have no trouble taking out the trash. 4    I do housework such as vacuuming and dusting on my own without difficulty. 4    I can easily lift a gallon of milk (8lbs). 4    I can easily walk a mile. 4    I have no trouble reaching into high cupboards or reaching down to pick up something from the floor. 4    I do not have trouble doing out-door work such as Armed forces logistics/support/administrative officer, raking leaves, or gardening. 3      Mobility and Daily Activities   I feel younger than my age. 4    I feel independent. 4    I feel energetic. 3    I live an active life.  3    I feel strong. 3    I feel healthy. 3    I feel active as other people my age. 4      How fit and strong are you.   Fit and Strong Total Score 51            Past Medical History:  Diagnosis Date   Anal fissure    Aortic insufficiency    Arthritis    Atrial flutter (HCC)    Bicuspid aortic valve    with mild to moderate AR by echo 08/2019   BPH (benign prostatic hyperplasia)    CAD (coronary artery disease)    s/p CABG [place]   Cataract    bilateral-removed   CKD (chronic kidney disease), stage III ([place])    Depression    Dilated aortic root (HCC)    History of blood transfusion 1998   "due to cardiac bypass surgery"   Hyperlipidemia    Hypertension    Hypogonadism in male    Kidney stones     Migraine    Mild carotid artery disease ([place]) 2009   Myocardial infarction ([place])    PVC's (premature ventricular contractions)    Retinal hemorrhage 11/04/2014   Sleep apnea with use of continuous positive airway pressure (CPAP)    Thoracic aortic aneurysm (TAA) ([place])    Past Surgical History:  Procedure Laterality Date   A-FLUTTER ABLATION N/A 09/29/2019   Procedure: A-FLUTTER ABLATION;  Surgeon: Constance Haw, MD;  Location: [place] [facility];  Service: Cardiovascular;  Laterality: N/A;   ATRIAL FIBRILLATION ABLATION N/A 08/02/2021   Procedure: ATRIAL FIBRILLATION ABLATION;  Surgeon: Constance Haw, MD;  Location: [place] [facility];  Service: Cardiovascular;  Laterality: N/A;   CARDIOVERSION N/A 08/10/2019   Procedure: CARDIOVERSION;  Surgeon: Elouise Munroe, MD;  Location: [facility];  Service: Cardiovascular;  Laterality: N/A;   CARDIOVERSION N/A 05/21/2021   Procedure: CARDIOVERSION;  Surgeon: Josue Hector, MD;  Location: [facility] ENDOSCOPY;  Service: Cardiovascular;  Laterality: N/A;   CARDIOVERSION N/A 07/12/2021   Procedure: CARDIOVERSION;  Surgeon: Jerline Pain, MD;  Location: [place] [facility]COPY;  Service: Cardiovascular;  Laterality: N/A;   cataracts     COLONOSCOPY     CORONARY ARTERY BYPASS GRAFT  1998   LEFT HEART CATH AND CORONARY ANGIOGRAPHY N/A 08/24/2018   Procedure: LEFT HEART CATH AND CORONARY ANGIOGRAPHY;  Surgeon: Lorretta Harp, MD;  Location: [place] [facility];  Service: Cardiovascular;  Laterality: N/A;   TEE WITHOUT CARDIOVERSION N/A 01/19/2015   Procedure: TRANSESOPHAGEAL ECHOCARDIOGRAM (TEE);  Surgeon: Sueanne Margarita, MD;  Location: [facility] ENDOSCOPY;  Service: Cardiovascular;  Laterality: N/A;   [place]   pt denies   Social History   Tobacco Use  Smoking Status Former   Years: 20.00   Types: Cigarettes   Quit date: 07/22/1986   Years since quitting: 35.4  Smokeless Tobacco Never  Tobacco Comments   Former smoker 05/09/2021   How fit and strong survey: 2/15 50 3/25:51 Attended all classes, 12 education sessions, 12 strength training sessions; gained 1.4 pounds and reduced body fat from 14.4 to 13.7  [place] 12/13/2021, 12:49 PM

## 2021-12-25 ENCOUNTER — Encounter: Payer: Self-pay | Admitting: Neurology

## 2021-12-26 ENCOUNTER — Other Ambulatory Visit: Payer: Self-pay | Admitting: *Deleted

## 2021-12-26 MED ORDER — ALPRAZOLAM ER 1 MG PO TB24
1.0000 mg | ORAL_TABLET | Freq: Every day | ORAL | 0 refills | Status: DC
Start: 2021-12-26 — End: 2022-04-02

## 2021-12-26 MED ORDER — TRAZODONE HCL 50 MG PO TABS: 75.0000 mg | ORAL_TABLET | Freq: Every day | ORAL | 0 refills | Status: DC

## 2021-12-26 NOTE — Telephone Encounter (Signed)
Checked drug registry. He last refilled xanax xr '1mg'$  10/01/21 #90 by Dr. Brett Fairy. Last seen 11/21/21 and next f/u 05/29/22.

## 2021-12-28 ENCOUNTER — Other Ambulatory Visit: Payer: Self-pay | Admitting: Cardiology

## 2022-02-07 ENCOUNTER — Other Ambulatory Visit: Payer: Self-pay | Admitting: Cardiology

## 2022-02-07 ENCOUNTER — Telehealth: Payer: Self-pay | Admitting: Cardiology

## 2022-02-07 NOTE — Telephone Encounter (Signed)
Patient called stating he recently returned from [place] and the airline lost her bag with all his medication.  He would like to speak to a nurse about this.

## 2022-02-07 NOTE — Telephone Encounter (Signed)
Pt reports he is close to almost 3 days without his medications. Reports he just returned from  a 5 week trip to [place]. All his medication were in prescribed bottles, but airlines have lost his bags. Advised pt to call pharmacy to discuss situation and getting medications refilled.  Aware cardiology medications have plenty of refills, except maybe not the Losartan.  Aware to have pharmacy send in refill request for Losartan if needed. Patient verbalized understanding and agreeable to plan.

## 2022-02-15 ENCOUNTER — Encounter: Payer: Self-pay | Admitting: Cardiology

## 2022-02-18 ENCOUNTER — Telehealth: Payer: Self-pay | Admitting: Cardiology

## 2022-02-18 NOTE — Telephone Encounter (Signed)
Patient with diagnosis of afib on Eliquis for anticoagulation.    Procedure: Extraction of 3 teeth Date of procedure: 02/20/22   CHA2DS2-VASc Score = 4   This indicates a 4.8% annual risk of stroke. The patient's score is based upon: CHF History: 1 HTN History: 1 Diabetes History: 0 Stroke History: 0 Vascular Disease History: 1 Age Score: 1 Gender Score: 0      CrCl 37 ml/min  Patient does NOT require pre-op antibiotics for dental procedure.  Per office protocol, patient can hold Eliquis for 1 day prior to procedure.     **This guidance is not considered finalized until pre-operative APP has relayed final recommendations.**

## 2022-02-18 NOTE — Telephone Encounter (Signed)
   Patient Name: [name]  DOB: 1945-11-15 MRN: [id number]  Primary Cardiologist: Fransico Him, MD  Chart reviewed as part of pre-operative protocol coverage. Given past medical history and time since last visit, based on ACC/AHA guidelines, [name] would be at acceptable risk for the planned procedure without further cardiovascular testing.   Patient with diagnosis of afib on Eliquis for anticoagulation.   Patient does NOT require pre-op antibiotics for dental procedure.   Per office protocol, patient can hold Eliquis for 1 day prior to procedure.        Procedure: Extraction of 3 teeth Date of procedure: 02/20/22  I will route this recommendation to the requesting party via [place] fax function and remove from pre-op pool.  Please call with questions.  Mable Fill, Marissa Nestle, NP 02/18/2022, 1:21 PM

## 2022-02-18 NOTE — Telephone Encounter (Signed)
Pharmacy team please advise on holding Eliquis for patient's upcoming dental extraction on 02/20/2022.  Thank you

## 2022-02-18 NOTE — Telephone Encounter (Signed)
   Pre-operative Risk Assessment    Patient Name: [name]  DOB: 04/24/1946 MRN: [id number]     Request for Surgical Clearance    Procedure:  Extraction of 3 teeth  Date of Surgery:  Clearance 02/20/22                                 Surgeon:    Dr. Heloise Ochoa Surgeon's Group or Practice Name:  [facility] Phone number:  [phone] Fax number:  [phone]   Type of Clearance Requested:   - Medical    Type of Anesthesia:  Local    Additional requests/questions:    Caller would like to hold the patient's Eliquis medication and needs to know if the patient should take pre-medication.   Signed, Heloise Beecham   02/18/2022, 10:12 AM

## 2022-02-24 ENCOUNTER — Encounter: Payer: Self-pay | Admitting: Cardiology

## 2022-02-24 NOTE — Progress Notes (Signed)
Cardiology Office Note Date:  02/27/2022  Patient ID:  Drew, [name], [name], [name] PCP:  Burman Freestone, MD  Cardiologist:  Dr. Radford Pax Electrophysiologist: Dr. Curt Bears    Chief Complaint:  planned 3 mo  History of Present Illness: [name] is a 77 y.o. male with history of HTN, HLD, CAD (CABG 1998). Afib/AFlutter, VHD bicuspid AV w/AI, dilated ascending AO/aneurysm of root (following with Dr. Clementeen Graham), OSA (follows with neurology), CKD (III), PVCs, hx of retinal hemorrhage, hypogonadism  He comes in today to be seen for Dr. Curt Bears, last seen by him 10/30/21, doing well post PVI ablation, amio was stopped.   dental extractions planned for 02/20/22, pre-op pool addressed, no antibiotics needed, OK to hold eliquis a day  TODAY He starts today by explaining that he at baseline is a very anxious person, and of late extremely so with a break up with his girlfriend and his pain after his dental extraction.  He saw his PMD 2 weeks ago for increased anxiety, struggles with living alone, and unintended weight loss He gets lightheaded with his anxiety, is again today  He continues to wake with blood in his mouth and is quite worried and remains in a lot of pain, has been started on antibiotic and pain meds via his dentist though is now out of town.  He estimates he has probably lost several ounces of blood the last few days  No CP, no palpitations He does not think that he has had AFib No syncope No bleeding outside the of his recent dental extractions and some ongoing oozing   Device information AFlutter Diagnosed 2020 Nov 2020: CT with nonspecific ILD Amiodarone started Jan 2021  AFlutter ablation 09/29/2019  Amiodarone stopped April 2021 Afib found Oct 2022 Amiodrone restarted  Nov 2022 PVI ablation 08/02/21 Amiodarone stopped April 2023  Past Medical History:  Diagnosis Date   Anal fissure    Aortic insufficiency    Arthritis    Atrial flutter (HCC)     Bicuspid aortic valve    with mild to moderate AR by echo 08/2019   BPH (benign prostatic hyperplasia)    CAD (coronary artery disease)    s/p CABG [place]   Cataract    bilateral-removed   CKD (chronic kidney disease), stage III ([place])    Depression    Dilated aortic root (HCC)    History of blood transfusion 1998   "due to cardiac bypass surgery"   Hyperlipidemia    Hypertension    Hypogonadism in male    Kidney stones    Migraine    Mild carotid artery disease ([place]) 2009   Myocardial infarction ([place])    PVC's (premature ventricular contractions)    Retinal hemorrhage 11/04/2014   Sleep apnea with use of continuous positive airway pressure (CPAP)    Thoracic aortic aneurysm (TAA) ([place])     Past Surgical History:  Procedure Laterality Date   A-FLUTTER ABLATION N/A 09/29/2019   Procedure: A-FLUTTER ABLATION;  Surgeon: Constance Haw, MD;  Location: [place];  Service: Cardiovascular;  Laterality: N/A;   ATRIAL FIBRILLATION ABLATION N/A 08/02/2021   Procedure: ATRIAL FIBRILLATION ABLATION;  Surgeon: Constance Haw, MD;  Location: [facility];  Service: Cardiovascular;  Laterality: N/A;   CARDIOVERSION N/A 08/10/2019   Procedure: CARDIOVERSION;  Surgeon: Elouise Munroe, MD;  Location: [facility] ENDOSCOPY;  Service: Cardiovascular;  Laterality: N/A;   CARDIOVERSION N/A 05/21/2021   Procedure: CARDIOVERSION;  Surgeon: Josue Hector, MD;  Location: [facility] ENDOSCOPY;  Service: Cardiovascular;  Laterality: N/A;   CARDIOVERSION N/A 07/12/2021   Procedure: CARDIOVERSION;  Surgeon: Jerline Pain, MD;  Location: [facility] ENDOSCOPY;  Service: Cardiovascular;  Laterality: N/A;   cataracts     COLONOSCOPY     CORONARY ARTERY BYPASS GRAFT  1998   LEFT HEART CATH AND CORONARY ANGIOGRAPHY N/A 08/24/2018   Procedure: LEFT HEART CATH AND CORONARY ANGIOGRAPHY;  Surgeon: Lorretta Harp, MD;  Location: [place] [facility];  Service: Cardiovascular;  Laterality: N/A;   TEE  WITHOUT CARDIOVERSION N/A 01/19/2015   Procedure: TRANSESOPHAGEAL ECHOCARDIOGRAM (TEE);  Surgeon: Sueanne Margarita, MD;  Location: [facility] ENDOSCOPY;  Service: Cardiovascular;  Laterality: N/A;   [place]   pt denies    Current Outpatient Medications  Medication Sig Dispense Refill   ALPRAZolam (ALPRAZOLAM XR) 1 MG 24 hr tablet Take 1 tablet (1 mg total) by mouth daily. 90 tablet 0   amLODipine (NORVASC) 5 MG tablet Take 1.5 tablets (7.5 mg total) by mouth daily. 135 tablet 3   amoxicillin (AMOXIL) 500 MG capsule Take 500 mg by mouth 3 (three) times daily.     apixaban (ELIQUIS) 5 MG TABS tablet Take 1 tablet (5 mg total) by mouth 2 (two) times daily. 60 tablet 11   Ascorbic Acid (VITAMIN C) 1000 MG tablet Take 1,000 mg by mouth 2 (two) times daily.     buPROPion (WELLBUTRIN XL) 300 MG 24 hr tablet Take 300 mg by mouth daily.     butalbital-acetaminophen-caffeine (FIORICET) 50-325-40 MG tablet Take 1-2 tablets by mouth daily as needed for headache (take at onset of migraine. no more than 2 tablets in 24 hours.). 15 tablet 5   diazepam (VALIUM) 5 MG tablet Take 2.5-5 mg by mouth 2 (two) times daily as needed for anxiety.      furosemide (LASIX) 20 MG tablet Take 1 tablet (20 mg total) by mouth daily. 90 tablet 3   HYDROcodone-acetaminophen (NORCO/VICODIN) 5-325 MG tablet Take 1 tablet by mouth every 6 (six) hours as needed for moderate pain or severe pain.     ibuprofen (ADVIL,MOTRIN) 200 MG tablet Take 200-400 mg by mouth every 8 (eight) hours as needed (for arthritis in the feet).     losartan (COZAAR) 25 MG tablet TAKE ONE TABLET BY MOUTH DAILY 90 tablet 2   Nebivolol HCl (BYSTOLIC) 20 MG TABS Take 1 tablet (20 mg total) by mouth daily. 90 tablet 3   niacin 500 MG tablet Take 500 mg by mouth at bedtime.     rosuvastatin (CRESTOR) 20 MG tablet TAKE ONE TABLET BY MOUTH DAILY 90 tablet 3   Specialty Vitamins Products (CENTRUM SPECIALIST ENERGY) TABS Take 1 tablet by mouth daily with  breakfast.     tadalafil (CIALIS) 5 MG tablet Take 5 mg by mouth every evening.     Testosterone 40.5 MG/2.5GM (1.62%) GEL Place 4 Pump onto the skin daily. Apply 4 pumps to each shoulder every morning     traZODone (DESYREL) 50 MG tablet Take 1.5 tablets (75 mg total) by mouth at bedtime. 135 tablet 0   Turmeric 500 MG CAPS Take 500 mg by mouth daily.     vitamin E 400 UNIT capsule Take 400 Units by mouth at bedtime.      No current facility-administered medications for this visit.    Allergies:   Dog epithelium allergy skin test and Mushroom extract complex   Social History:  The patient  reports that he quit smoking about 35 years ago. His smoking use included cigarettes. He has never used smokeless tobacco. He reports current alcohol use of about 1.0 standard drink of alcohol per week. He reports that he does not use drugs.   Family History:  The patient's family history includes Arthritis in his father and mother; Diabetes in his father and mother; Heart disease (age of onset: 83) in his father; Hyperlipidemia in his father and mother; Hypertension in his father; Kidney disease in his paternal grandfather; Stroke (age of onset: 53) in his father.  ROS:  Please see the history of present illness.    All other systems are reviewed and otherwise negative.   PHYSICAL EXAM:  VS:  BP 124/60   Pulse 64   Ht '5\' 7"'$  (1.702 m)   Wt 134 lb 9.6 oz (61.1 kg)   SpO2 97%   BMI 21.08 kg/m  BMI: Body mass index is 21.08 kg/m. Well nourished, well developed, in no acute distress HEENT: normocephalic, atraumatic Neck: no JVD, carotid bruits or masses Cardiac:  RRR; no significant murmurs, no rubs, or gallops Lungs:  CTA b/l, no wheezing, rhonchi or rales Abd: soft, nontender MS: no deformity or atrophy Ext: no edema Skin: warm and dry, no rash Neuro:  No gross deficits appreciated Psych: euthymic mood, full affect   EKG:  Done today and reviewed by myself shows  SR 64bpm, baseline  artifact, normal intervals   08/23/2021: TTE 1. Left ventricular ejection fraction, by estimation, is 60 to 65%. Left  ventricular ejection fraction by 3D volume is 62 %. The left ventricle has  normal function. The left ventricle has no regional wall motion  abnormalities. There is moderate  concentric left ventricular hypertrophy. Left ventricular diastolic  parameters were normal. The average left ventricular global longitudinal  strain is -21.7 %. The global longitudinal strain is normal.   2. Right ventricular systolic function is normal. The right ventricular  size is normal. There is normal pulmonary artery systolic pressure. The  estimated right ventricular systolic pressure is 20.2 mmHg.   3. Left atrial size was mildly dilated.   4. The mitral valve is normal in structure. Trivial mitral valve  regurgitation. No evidence of mitral stenosis.   5. The aortic valve has an indeterminant number of cusps. Aortic valve  regurgitation is mild to moderate. Aortic valve sclerosis is present, with  no evidence of aortic valve stenosis. Aortic regurgitation PHT measures  797 msec.   6. Aortic dilatation noted. Aneurysm of the aortic root, measuring 46 mm.  Aneurysm of the ascending aorta, measuring 47 mm.   7. The inferior vena cava is normal in size with greater than 50%  respiratory variability, suggesting right atrial pressure of 3 mmHg.   08/02/2021: EPS/Ablation CONCLUSIONS: 1. Sinus rhythm upon presentation.   2. Successful electrical isolation and anatomical encircling of all four pulmonary veins with radiofrequency current.  A WACA approach was used 3. Additional left atrial ablation was performed with a standard box lesion created along the posterior wall of the left atrium 4. No early apparent complications.  09/29/2019: EPS/ablation  CONCLUSIONS:   1. Isthmus-dependent right atrial flutter upon presentation.   2. Successful radiofrequency ablation of atrial flutter along the  cavotricuspid isthmus with complete bidirectional isthmus block achieved.   3. No inducible arrhythmias following ablation.   4. No early apparent complications.   Recent Labs: 04/27/2021: TSH 2.760 07/17/2021: Magnesium 2.4; NT-Pro BNP 949 08/02/2021: Hemoglobin 13.4;  Platelets 197 08/28/2021: ALT 25 09/07/2021: BUN 25; Creatinine, Ser 1.66; Potassium 4.2; Sodium 138  08/28/2021: Chol/HDL Ratio 1.9; Cholesterol, Total 86; HDL 46; LDL Chol Calc (NIH) 23; Triglycerides 83   CrCl cannot be calculated (Patient's most recent lab result is older than the maximum 21 days allowed.).   Wt Readings from Last 3 Encounters:  02/27/22 134 lb 9.6 oz (61.1 kg)  12/13/21 144 lb (65.3 kg)  12/11/21 139 lb (63 kg)     Other studies reviewed: Additional studies/records reviewed today include: summarized above  ASSESSMENT AND PLAN:  AFlutter (typical) ablated Persistent AFib S/p ablation CHA2DS2Vasc is 3, on Eliquis, appropriately dosed No/low burden by symptoms Off amiodarone  I have advised him to go ahead and hold the Eliquis for the next 2-3 days and reach out to his dentist/covering dentist for further advisement on this and his oozing and pain, or UCC needed  CAD No anginal symptoms C/w Dr. Radford Pax  VHD Bicuspid AV Mild - mod AI C/w Dr. Radford Pax  Aortic aneurysm Follows with Dr. Clementeen Graham  6. Anxiety, lightheaded Vitals are stable I urged him to reach out to his PMD/continue to manage with him Offered to get a CBC today though he declined, doesn't think he is bleeding that much   Disposition: F/u with Dr. Durene Romans in Jan or so, about the year mark  from his ablation  Current medicines are reviewed at length with the patient today.  The patient did not have any concerns regarding medicines.  Venetia Night, PA-C 02/27/2022 1:09 PM     [address] [place] [address] [phone] (office)  [phone] (fax)

## 2022-02-25 ENCOUNTER — Encounter (INDEPENDENT_AMBULATORY_CARE_PROVIDER_SITE_OTHER): Payer: Self-pay

## 2022-02-27 ENCOUNTER — Encounter: Payer: Self-pay | Admitting: Physician Assistant

## 2022-02-27 ENCOUNTER — Ambulatory Visit (INDEPENDENT_AMBULATORY_CARE_PROVIDER_SITE_OTHER): Payer: Medicare Other | Admitting: Physician Assistant

## 2022-02-27 VITALS — BP 124/60 | HR 64 | Ht 67.0 in | Wt 134.6 lb

## 2022-02-27 DIAGNOSIS — I4819 Other persistent atrial fibrillation: Secondary | ICD-10-CM | POA: Diagnosis not present

## 2022-02-27 DIAGNOSIS — I2510 Atherosclerotic heart disease of native coronary artery without angina pectoris: Secondary | ICD-10-CM

## 2022-02-27 NOTE — Patient Instructions (Signed)
Medication Instructions:   Your physician recommends that you continue on your current medications as directed. Please refer to the Current Medication list given to you today.   ITS OKAY PER DENTIST TO HOLD ELIQUIS 2-3 DAYS  UNTIL DENTIST INSTRUCT TO RESUME   *If you need a refill on your cardiac medications before your next appointment, please call your pharmacy*   Lab Work: NONE ORDERED  TODAY   If you have labs (blood work) drawn today and your tests are completely normal, you will receive your results only by: MyChart Message (if you have MyChart) OR A paper copy in the mail If you have any lab test that is abnormal or we need to change your treatment, we will call you to review the results.   Testing/Procedures: NONE ORDERED  TODAY     Follow-Up: At [facility], you and your health needs are our priority.  As part of our continuing mission to provide you with exceptional heart care, we have created designated Provider Care Teams.  These Care Teams include your primary Cardiologist (physician) and Advanced Practice Providers (APPs -  Physician Assistants and Nurse Practitioners) who all work together to provide you with the care you need, when you need it.  We recommend signing up for the patient portal called "MyChart".  Sign up information is provided on this After Visit Summary.  MyChart is used to connect with patients for Virtual Visits (Telemedicine).  Patients are able to view lab/test results, encounter notes, upcoming appointments, etc.  Non-urgent messages can be sent to your provider as well.   To learn more about what you can do with MyChart, go to [email or web address].    Your next appointment:   4 month(s)  The format for your next appointment:   In Person  Provider:   You may see Will Meredith Leeds, MD or one of the following Advanced Practice Providers on your designated Care Team:   Tommye Standard, [place] Legrand Como "Jonni Sanger" Chalmers Cater, [place]    Other  Instructions   Important Information About Sugar

## 2022-03-04 IMAGING — CT CT HEART MORPH/PULM VEIN W/ CM & W/O CA SCORE
2 of 5 series · 10 of 20 positions shown, 12 images · non-contrast
Comparison: None.
COMPARISON: None.

Addendum:
EXAM:
OVER-READ INTERPRETATION  CT CHEST

The following report is an over-read performed by radiologist Dr.
over-read does not include interpretation of cardiac or coronary
anatomy or pathology. The cardiac CTA interpretation by the
cardiologist is attached.
CLINICAL DATA: Atrial fibrillation scheduled for ablation.
Cardiac CTA
TECHNIQUE: A non-contrast, gated CT scan was obtained with axial slices of 3 mm
through the heart for calcium scoring. Calcium scoring was performed
using the Agatston method. A 120 kV retrospective, gated, contrast
cardiac scan was obtained. Gantry rotation speed was 250 msecs and
collimation was 0.6 mm. Nitroglycerin was not given. A delayed scan
was obtained to exclude left atrial appendage thrombus. The 3D
dataset was reconstructed in 5% intervals of the 0-95% of the R-R
cycle. Late systolic phases were analyzed on a dedicated workstation
using MPR, MIP, and VRT modes. The patient received 80 cc of
contrast.

[Series 6: best syst · axial · 0.39mm/px · z∈[-258,-157]mm · 7 of 336 slices shown, 9 images]
[im 42/336  vessel]
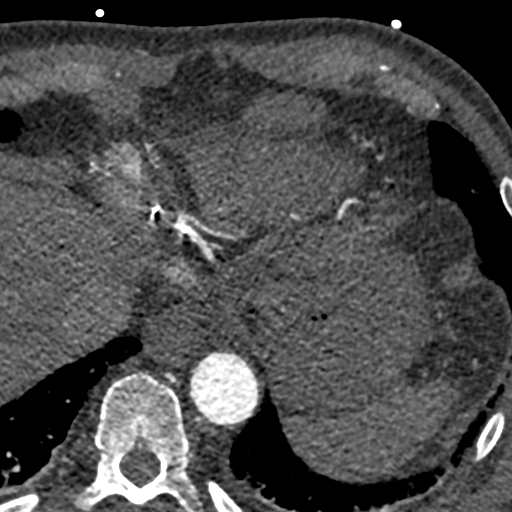
[im 42/336  lung]
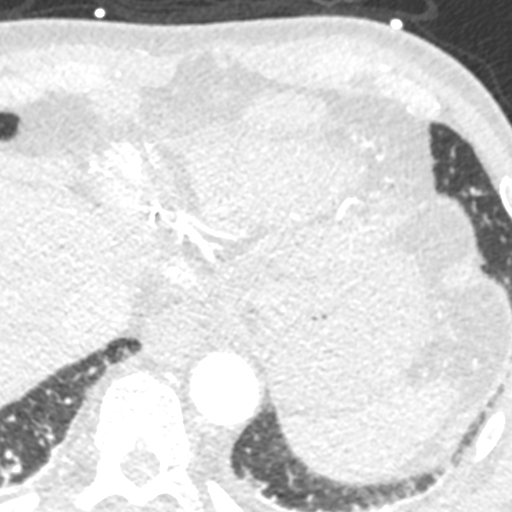
[im 84/336  vessel]
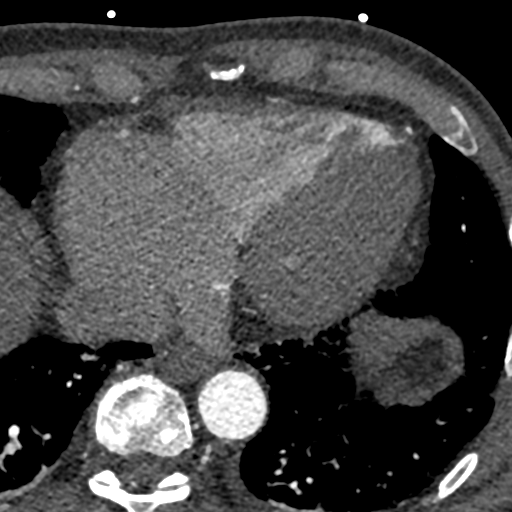
[im 126/336  vessel]
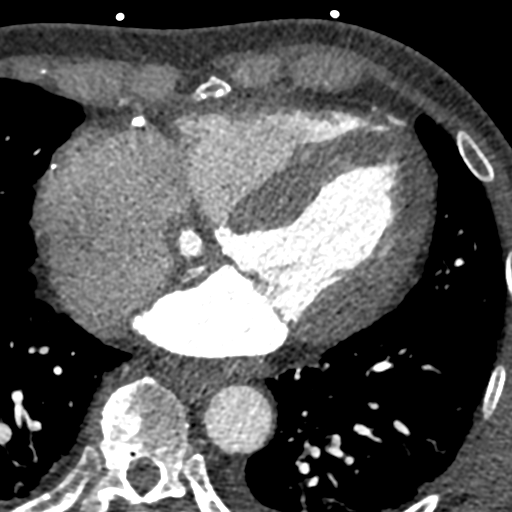
[im 168/336  vessel]
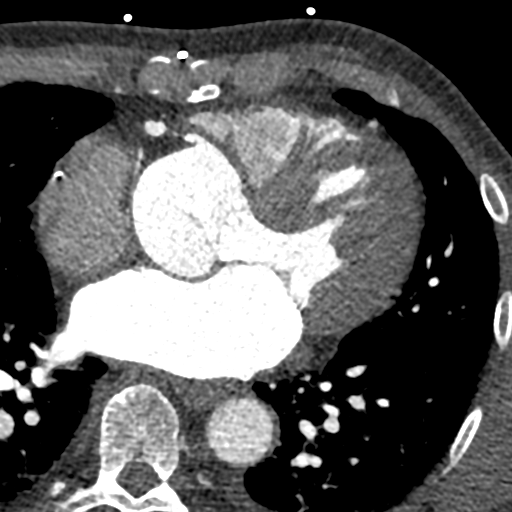
[im 210/336  vessel]
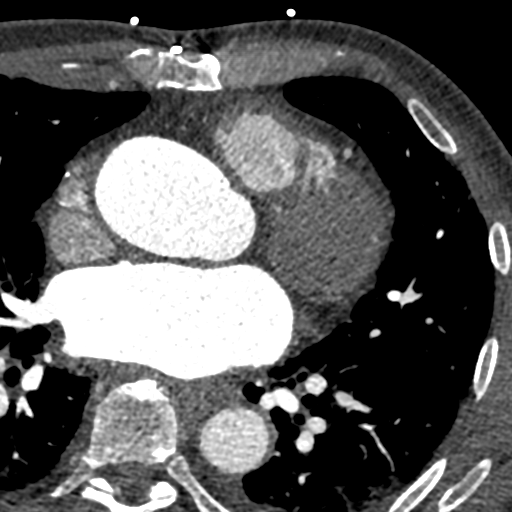
[im 210/336  lung]
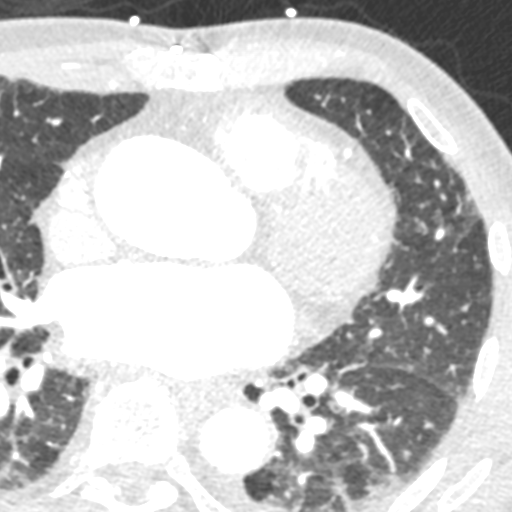
[im 252/336  vessel]
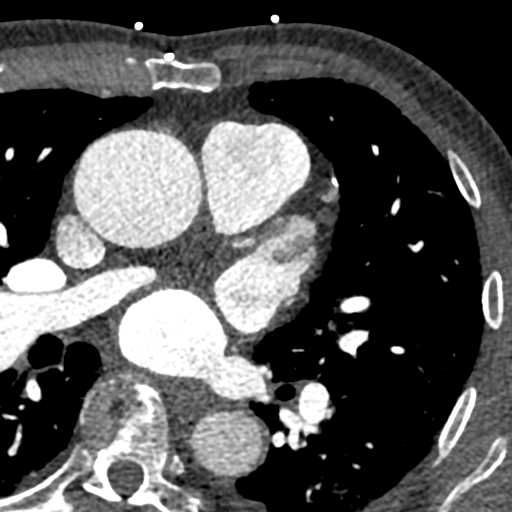
[im 294/336  vessel]
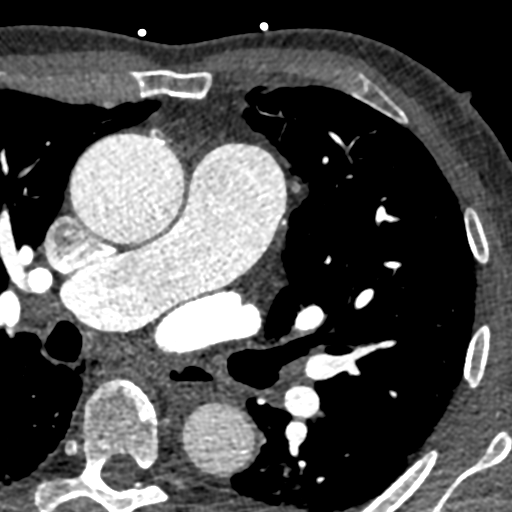

[Series 10: pv delay · axial · portal-venous · 0.39mm/px · z∈[-220,-167]mm · 3 of 215 slices shown]
[im 54/215  vessel]
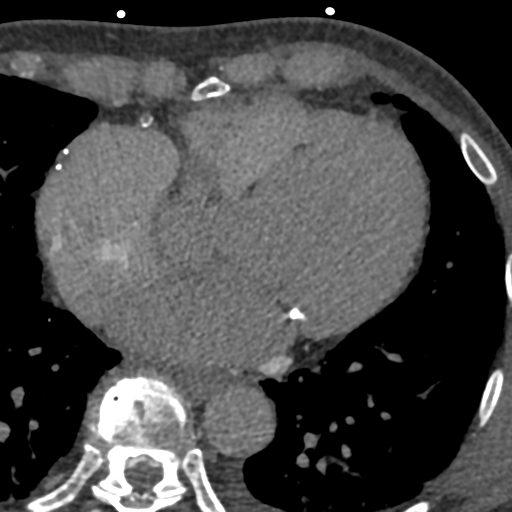
[im 108/215  vessel]
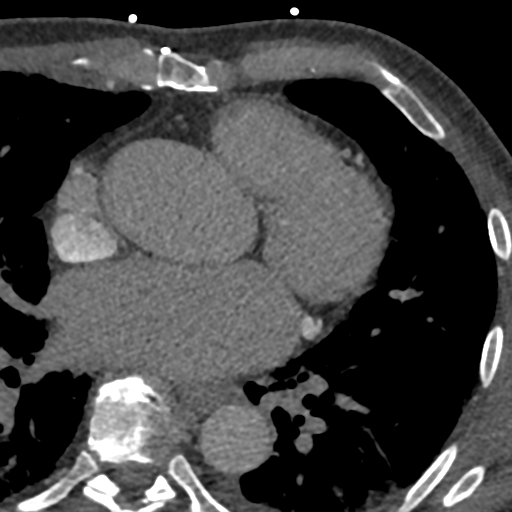
[im 161/215  vessel]
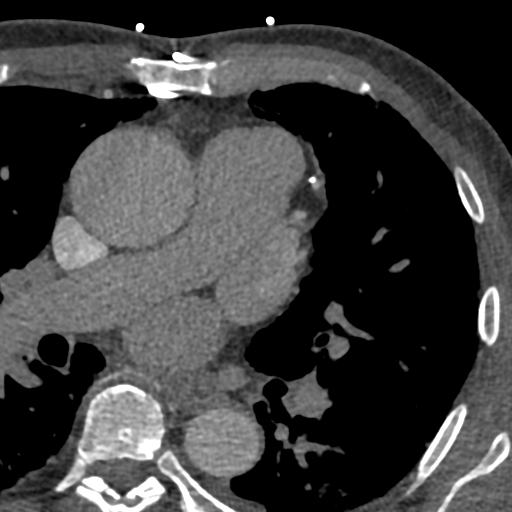

[10 of 20 positions shown; findings below may reference images not displayed]

FINDINGS: Vascular: There is aneurysmal disease of the ascending thoracic
aorta measuring up to approximately 4.7 cm in maximum diameter. The
aortic root also appears dilated at the level of the sinuses of
Valsalva measuring approximately 4.8-5.0 cm.

Mediastinum/Nodes: Visualized mediastinum and hilar regions
demonstrate no lymphadenopathy or masses.

Lungs/Pleura: Mild diffuse interstitial thickening likely reflects
some degree of underlying chronic lung disease. Visualized lungs
show no evidence of pulmonary edema, consolidation, pneumothorax,
nodule or pleural fluid.

Upper Abdomen: No acute abnormality.

Musculoskeletal: No chest wall mass or suspicious bone lesions
identified.
IMPRESSION: 1. Aneurysmal disease of the aortic root and ascending thoracic
aorta. The aortic root measures up to approximately 5 cm and the
ascending thoracic aorta up to approximately 4.7 cm. Ascending
thoracic aortic aneurysm. Recommend semi-annual imaging followup by
CTA or MRA and referral to cardiothoracic surgery if not already
obtained. This recommendation follows 0717
ACCF/AHA/AATS/ACR/ASA/SCA/VARD/SUNSUN/MAXAMED/BESSER Guidelines for the
Diagnosis and Management of Patients With Thoracic Aortic Disease.
Circulation. 0717; 121: [id number]. Aortic aneurysm NOS (TLWXF-W94.E)
2. Probable underlying chronic lung disease.
FINDINGS: Image quality: Excellent.

Noise artifact is: Limited.

Pulmonary Veins: There is normal pulmonary vein drainage into the
left atrium (2 on the right and 2 on the left) with ostial
measurements as follows:

RUPV: Ostium 13.7 mm x 12.5 mm  area 1.21 cm2

RLPV:  Ostium 13.4 mm x 11.0 mm  area 1.04 cm2

LUPV:  Ostium 17.1 mm x 13.4 mm area 1.62 cm2

LLPV:  Ostium 18.8 mm x 16.7 mm  area 2.39 cm2

Left Atrium: The left atrial size is normal. There is no PFO/ASD.
The left atrial appendage is large broccoli type with two lobes.
There is no thrombus in the left atrial appendage on contrast or
delayed imaging. The esophagus runs in the left atrial midline and
is not in proximity to any of the pulmonary vein ostia.

Coronary Arteries: CAC score of 9353, which is 91 percentile for
age-, race-, and sex-matched controls. Normal coronary origin. Right
dominance. The study was performed without use of NTG and is
insufficient for plaque evaluation.

Right Atrium: Right atrial size is within normal limits.

Right Ventricle: The right ventricular cavity is within normal
limits.

Left Ventricle: The ventricular cavity size is within normal limits.
There are no stigmata of prior infarction. There is no abnormal
filling defect.

Pericardium: Normal thickness with no significant effusion or
calcium present.

Pulmonary Artery: Normal caliber without proximal filling defect.

Cardiac valves: The aortic valve is trileaflet without significant
calcification. The mitral valve is normal structure without
significant calcification.

Aorta: The ascending aorta is aneurysmal 47.4 mm, with no
significant disease.

Extra-cardiac findings: See attached radiology report for
non-cardiac structures.
IMPRESSION: 1. There is normal pulmonary vein drainage into the left atrium with
ostial measurements above.

2. There is no thrombus in the left atrial appendage.

3. The esophagus runs in the left atrial midline and is not in
proximity to any of the pulmonary vein ostia.

4. No PFO/ASD.

5. Normal coronary origin. Right dominance.

6. CAC score of 9353 which is 91 percentile for age-, race-, and
sex-matched controls.

7. Proximal ascending aorta aneurysm (47.4 mm).

Alia Tiger, DO

*** End of Addendum ***
EXAM:
OVER-READ INTERPRETATION  CT CHEST

The following report is an over-read performed by radiologist Dr.
over-read does not include interpretation of cardiac or coronary
anatomy or pathology. The cardiac CTA interpretation by the
cardiologist is attached.
FINDINGS: Vascular: There is aneurysmal disease of the ascending thoracic
aorta measuring up to approximately 4.7 cm in maximum diameter. The
aortic root also appears dilated at the level of the sinuses of
Valsalva measuring approximately 4.8-5.0 cm.

Mediastinum/Nodes: Visualized mediastinum and hilar regions
demonstrate no lymphadenopathy or masses.

Lungs/Pleura: Mild diffuse interstitial thickening likely reflects
some degree of underlying chronic lung disease. Visualized lungs
show no evidence of pulmonary edema, consolidation, pneumothorax,
nodule or pleural fluid.

Upper Abdomen: No acute abnormality.

Musculoskeletal: No chest wall mass or suspicious bone lesions
identified.
IMPRESSION: 1. Aneurysmal disease of the aortic root and ascending thoracic
aorta. The aortic root measures up to approximately 5 cm and the
ascending thoracic aorta up to approximately 4.7 cm. Ascending
thoracic aortic aneurysm. Recommend semi-annual imaging followup by
CTA or MRA and referral to cardiothoracic surgery if not already
obtained. This recommendation follows 0717
ACCF/AHA/AATS/ACR/ASA/SCA/VARD/SUNSUN/MAXAMED/BESSER Guidelines for the
Diagnosis and Management of Patients With Thoracic Aortic Disease.
Circulation. 0717; 121: [id number]. Aortic aneurysm NOS (TLWXF-W94.E)
2. Probable underlying chronic lung disease.

## 2022-03-12 ENCOUNTER — Emergency Department (HOSPITAL_COMMUNITY): Payer: Medicare Other

## 2022-03-12 ENCOUNTER — Emergency Department (HOSPITAL_COMMUNITY)
Admission: EM | Admit: 2022-03-12 | Discharge: 2022-03-13 | Disposition: A | Discharge status: Home / Self Care | Payer: Medicare Other | Attending: Emergency Medicine | Admitting: Emergency Medicine

## 2022-03-12 DIAGNOSIS — R0602 Shortness of breath: Secondary | ICD-10-CM | POA: Insufficient documentation

## 2022-03-12 DIAGNOSIS — R600 Localized edema: Secondary | ICD-10-CM | POA: Diagnosis not present

## 2022-03-12 DIAGNOSIS — I129 Hypertensive chronic kidney disease with stage 1 through stage 4 chronic kidney disease, or unspecified chronic kidney disease: Secondary | ICD-10-CM | POA: Diagnosis not present

## 2022-03-12 DIAGNOSIS — R079 Chest pain, unspecified: Secondary | ICD-10-CM | POA: Insufficient documentation

## 2022-03-12 DIAGNOSIS — Z7901 Long term (current) use of anticoagulants: Secondary | ICD-10-CM | POA: Insufficient documentation

## 2022-03-12 DIAGNOSIS — Z79899 Other long term (current) drug therapy: Secondary | ICD-10-CM | POA: Diagnosis not present

## 2022-03-12 DIAGNOSIS — N183 Chronic kidney disease, stage 3 unspecified: Secondary | ICD-10-CM | POA: Insufficient documentation

## 2022-03-12 DIAGNOSIS — I2510 Atherosclerotic heart disease of native coronary artery without angina pectoris: Secondary | ICD-10-CM | POA: Diagnosis not present

## 2022-03-12 LAB — CBC WITH DIFFERENTIAL/PLATELET
Abs Immature Granulocytes: 0.02 10*3/uL (ref 0.00–0.07)
Basophils Absolute: 0 10*3/uL (ref 0.0–0.1)
Basophils Relative: 0 %
Eosinophils Absolute: 0.1 10*3/uL (ref 0.0–0.5)
Eosinophils Relative: 1 %
HCT: 37.9 % — ABNORMAL LOW (ref 39.0–52.0)
Hemoglobin: 13 g/dL (ref 13.0–17.0)
Immature Granulocytes: 0 %
Lymphocytes Relative: 21 %
Lymphs Abs: 1.5 10*3/uL (ref 0.7–4.0)
MCH: 34.8 pg — ABNORMAL HIGH (ref 26.0–34.0)
MCHC: 34.3 g/dL (ref 30.0–36.0)
MCV: 101.3 fL — ABNORMAL HIGH (ref 80.0–100.0)
Monocytes Absolute: 0.7 10*3/uL (ref 0.1–1.0)
Monocytes Relative: 9 %
Neutro Abs: 5 10*3/uL (ref 1.7–7.7)
Neutrophils Relative %: 69 %
Platelets: 229 10*3/uL (ref 150–400)
RBC: 3.74 MIL/uL — ABNORMAL LOW (ref 4.22–5.81)
RDW: 11.6 % (ref 11.5–15.5)
WBC: 7.3 10*3/uL (ref 4.0–10.5)
nRBC: 0 % (ref 0.0–0.2)

## 2022-03-12 LAB — BASIC METABOLIC PANEL
Anion gap: 9 (ref 5–15)
BUN: 20 mg/dL (ref 8–23)
CO2: 26 mmol/L (ref 22–32)
Calcium: 9 mg/dL (ref 8.9–10.3)
Chloride: 104 mmol/L (ref 98–111)
Creatinine, Ser: 1.72 mg/dL — ABNORMAL HIGH (ref 0.61–1.24)
GFR, Estimated: 41 mL/min — ABNORMAL LOW (ref >60)
Glucose, Bld: 115 mg/dL — ABNORMAL HIGH (ref 70–99)
Potassium: 3.9 mmol/L (ref 3.5–5.1)
Sodium: 139 mmol/L (ref 135–145)

## 2022-03-12 LAB — TROPONIN I (HIGH SENSITIVITY): Troponin I (High Sensitivity): 8 ng/L (ref <18)

## 2022-03-12 NOTE — ED Provider Triage Note (Signed)
Emergency Medicine Provider Triage Evaluation Note  [name] , a 76 y.o. male  was evaluated in triage.  Pt complains of chest pain and shortness of breath that has been intermittent for the past 3 days.  Patient is concerned that he has been having episodes of A-fib.  Patient been compliant with his anticoagulant.  Review of Systems  Positive: CP, SOB Negative: fever  Physical Exam  BP (!) 121/55 (BP Location: Left Arm)   Pulse 75   Temp 98 F (36.7 C) (Oral)   Resp 20   SpO2 96%  Gen:   Awake, no distress   Resp:  Normal effort  MSK:   Moves extremities without difficulty  Other:    Medical Decision Making  Medically screening exam initiated at 9:33 PM.  Appropriate orders placed.  [name] was informed that the remainder of the evaluation will be completed by another provider, this initial triage assessment does not replace that evaluation, and the importance of remaining in the ED until their evaluation is complete.  labs   [name] 03/12/22 2134

## 2022-03-12 NOTE — ED Triage Notes (Signed)
Pt c/o CP & SHOB, fatigue, concern for afib "episode" x3 days, worsening today. Endorses hx of same, well-controlled w medication. Pt of [facility], Traci Turner.

## 2022-03-13 ENCOUNTER — Encounter (HOSPITAL_COMMUNITY): Payer: Self-pay | Admitting: Emergency Medicine

## 2022-03-13 ENCOUNTER — Other Ambulatory Visit: Payer: Self-pay

## 2022-03-13 DIAGNOSIS — R0602 Shortness of breath: Secondary | ICD-10-CM | POA: Diagnosis not present

## 2022-03-13 LAB — TROPONIN I (HIGH SENSITIVITY): Troponin I (High Sensitivity): 8 ng/L (ref <18)

## 2022-03-13 LAB — BRAIN NATRIURETIC PEPTIDE: B Natriuretic Peptide: 276.4 pg/mL — ABNORMAL HIGH (ref 0.0–100.0)

## 2022-03-13 MED ORDER — FUROSEMIDE 20 MG PO TABS
40.0000 mg | ORAL_TABLET | Freq: Once | ORAL | Status: AC
  Administered 2022-03-13: 40 mg via ORAL
  Filled 2022-03-13: qty 2

## 2022-03-13 NOTE — ED Provider Notes (Signed)
[name] Provider Note   CSN: [id number] Arrival date & time: 03/12/22  2106     History  Chief Complaint  Patient presents with   Shortness of Breath   Chest Pain    [name] is a 76 y.o. male with medical history of a flutter, bicuspid aortic valve, CAD, CKD stage III, dilated aortic root, hypertension, MI, PVCs, A-fib currently on Eliquis.  Patient is status post PVI ablation in 08/02/2021, amiodarone was stopped in April 2023.  The patient presents to ED for evaluation of shortness of breath and chest pain.  The patient reports that for the last 5 days he believes he is "been in A-fib".  Patient reports that he has not at home EKG machine, has not utilized it over the past 5 days, he cannot tell me why.  The patient states that this chest pain is been intermittent, associated with exertion, does not radiate.  Patient also reporting shortness of breath that is worse with exertion, worse when lying flat on his back.  Patient denies history of CHF.  Patient reports that he recently saw his cardiologist on 8/9, had a reassuring work-up.  Patient denies any nausea, vomiting, fevers, abdominal pain, lightheadedness, dizziness, weakness.  Physical examination does show 1+ pitting edema bilaterally to lower extremities.    Shortness of Breath Associated symptoms: chest pain   Associated symptoms: no abdominal pain, no fever and no vomiting   Chest Pain Associated symptoms: shortness of breath   Associated symptoms: no abdominal pain, no dizziness, no fever, no nausea, no vomiting and no weakness        Home Medications Prior to Admission medications   Medication Sig Start Date End Date Taking? Authorizing Provider  ALPRAZolam (ALPRAZOLAM XR) 1 MG 24 hr tablet Take 1 tablet (1 mg total) by mouth daily. 12/26/21   Sater, Nanine Means, MD  amLODipine (NORVASC) 5 MG tablet Take 1.5 tablets (7.5 mg total) by mouth daily. 08/29/21   Sueanne Margarita, MD   amoxicillin (AMOXIL) 500 MG capsule Take 500 mg by mouth 3 (three) times daily. 02/25/22   [provider]  apixaban (ELIQUIS) 5 MG TABS tablet Take 1 tablet (5 mg total) by mouth 2 (two) times daily. 04/27/21   Sherren Mocha, MD  Ascorbic Acid (VITAMIN C) 1000 MG tablet Take 1,000 mg by mouth 2 (two) times daily.    [provider]  buPROPion (WELLBUTRIN XL) 300 MG 24 hr tablet Take 300 mg by mouth daily.    [provider]  butalbital-acetaminophen-caffeine (FIORICET) 50-325-40 MG tablet Take 1-2 tablets by mouth daily as needed for headache (take at onset of migraine. no more than 2 tablets in 24 hours.). 11/20/21   Dohmeier, Asencion Partridge, MD  diazepam (VALIUM) 5 MG tablet Take 2.5-5 mg by mouth 2 (two) times daily as needed for anxiety.  10/05/14   [provider]  furosemide (LASIX) 20 MG tablet Take 1 tablet (20 mg total) by mouth daily. 08/30/21   Sueanne Margarita, MD  HYDROcodone-acetaminophen (NORCO/VICODIN) 5-325 MG tablet Take 1 tablet by mouth every 6 (six) hours as needed for moderate pain or severe pain. 02/25/22   [provider]  ibuprofen (ADVIL,MOTRIN) 200 MG tablet Take 200-400 mg by mouth every 8 (eight) hours as needed (for arthritis in the feet).    [provider]  losartan (COZAAR) 25 MG tablet TAKE ONE TABLET BY MOUTH DAILY 02/08/22   Sueanne Margarita, MD  Nebivolol HCl (  BYSTOLIC) 20 MG TABS Take 1 tablet (20 mg total) by mouth daily. 08/28/21   Sueanne Margarita, MD  niacin 500 MG tablet Take 500 mg by mouth at bedtime.    [provider]  rosuvastatin (CRESTOR) 20 MG tablet TAKE ONE TABLET BY MOUTH DAILY 12/28/21   Sueanne Margarita, MD  Specialty Vitamins Products (CENTRUM SPECIALIST ENERGY) TABS Take 1 tablet by mouth daily with breakfast.    [provider]  tadalafil (CIALIS) 5 MG tablet Take 5 mg by mouth every evening.    [provider]  Testosterone 40.5 MG/2.5GM (1.62%) GEL Place 4 Pump onto the skin daily.  Apply 4 pumps to each shoulder every morning    [provider]  traZODone (DESYREL) 50 MG tablet Take 1.5 tablets (75 mg total) by mouth at bedtime. 12/26/21   Sater, Nanine Means, MD  Turmeric 500 MG CAPS Take 500 mg by mouth daily.    [provider]  vitamin E 400 UNIT capsule Take 400 Units by mouth at bedtime.     [provider]      Allergies    Dog epithelium allergy skin test and Mushroom extract complex    Review of Systems   Review of Systems  Constitutional:  Negative for fever.  Respiratory:  Positive for shortness of breath.   Cardiovascular:  Positive for chest pain and leg swelling.  Gastrointestinal:  Negative for abdominal pain, nausea and vomiting.  Neurological:  Negative for dizziness, weakness and light-headedness.  All other systems reviewed and are negative.   Physical Exam Updated Vital Signs BP 111/66   Pulse (!) 56   Temp 97.8 F (36.6 C) (Oral)   Resp 14   Ht '5\' 7"'$  (1.702 m)   Wt 61.7 kg   SpO2 99%   BMI 21.30 kg/m  Physical Exam Vitals and nursing note reviewed.  Constitutional:      General: He is not in acute distress.    Appearance: Normal appearance. He is not ill-appearing, toxic-appearing or diaphoretic.  HENT:     Head: Normocephalic and atraumatic.     Nose: Nose normal. No congestion.     Mouth/Throat:     Mouth: Mucous membranes are moist.     Pharynx: Oropharynx is clear.  Eyes:     Extraocular Movements: Extraocular movements intact.     Conjunctiva/sclera: Conjunctivae normal.     Pupils: Pupils are equal, round, and reactive to light.  Cardiovascular:     Rate and Rhythm: Normal rate and regular rhythm.  Pulmonary:     Effort: Pulmonary effort is normal.     Breath sounds: Normal breath sounds. No wheezing or rales.  Abdominal:     General: Abdomen is flat. Bowel sounds are normal.     Palpations: Abdomen is soft.     Tenderness: There is no abdominal tenderness.  Musculoskeletal:     Cervical  back: Normal range of motion and neck supple. No tenderness.     Right lower leg: 1+ Pitting Edema present.     Left lower leg: 1+ Pitting Edema present.  Skin:    General: Skin is warm and dry.     Capillary Refill: Capillary refill takes less than 2 seconds.  Neurological:     Mental Status: He is alert and oriented to person, place, and time.     ED Results / Procedures / Treatments   Labs (all labs ordered are listed, but only abnormal results are displayed) Labs Reviewed  CBC WITH DIFFERENTIAL/PLATELET - Abnormal; Notable for the following components:      Result Value   RBC 3.74 (*)    HCT 37.9 (*)    MCV 101.3 (*)    MCH 34.8 (*)    All other components within normal limits  BASIC METABOLIC PANEL - Abnormal; Notable for the following components:   Glucose, Bld 115 (*)    Creatinine, Ser 1.72 (*)    GFR, Estimated 41 (*)    All other components within normal limits  BRAIN NATRIURETIC PEPTIDE - Abnormal; Notable for the following components:   B Natriuretic Peptide 276.4 (*)    All other components within normal limits  TROPONIN I (HIGH SENSITIVITY)  TROPONIN I (HIGH SENSITIVITY)    EKG EKG Interpretation  Date/Time:  Tuesday March 12 2022 21:25:55 EDT Ventricular Rate:  76 PR Interval:  194 QRS Duration: 84 QT Interval:  418 QTC Calculation: 470 R Axis:   107 Text Interpretation: Sinus rhythm with frequent Premature ventricular complexes and Premature atrial complexes Rightward axis Non-specific ST-t changes Baseline wander Poor data quality Confirmed by Lajean Saver [id number]) on 03/13/2022 7:00:35 AM  Radiology DG Chest 1 View  Result Date: 03/12/2022 CLINICAL DATA:  Chest pain EXAM: CHEST  1 VIEW COMPARISON:  08/21/2018 FINDINGS: Post sternotomy changes. Mild airspace disease at the right base. Normal cardiac size. No pneumothorax. Aortic atherosclerosis IMPRESSION: 1. Mild airspace disease at the right infrahilar lung, atelectasis versus pneumonia  Electronically Signed   By: Donavan Foil M.D.   On: 03/12/2022 21:59    Procedures Procedures   Medications Ordered in ED Medications  furosemide (LASIX) tablet 40 mg (has no administration in time range)    ED Course/ Medical Decision Making/ A&P                           Medical Decision Making Amount and/or Complexity of Data Reviewed Labs: ordered.   76 year old male presents to the ED for evaluation.  Please see HPI for further details.  On examination, the patient is afebrile and nontachycardic.  Patient lung sounds clear bilaterally, no wheezing or rails, not hypoxic.  Patient abdomen soft and compressible.  Patient neurological examination shows no focal neurodeficits.  The patient does have bilateral 1+ pitting edema to his lower extremities.  Patient worked up utilizing the following labs and imaging studies interpreted by me personally: - Troponin 8, delta troponin 8 respectively - BMP with elevation to 276 however on chart review the patient had a BNP in the 900s in December.  Patient is minimally edematous, no adventitious lung sounds to suggest fluid overload or effusions. - Plain from imaging of chest shows no effusions, consolidations - CBC unremarkable - BMP with stable creatinine at 1.7 which is in line with the patient baseline, CKD stage III - EKG nonischemic, no appearance of A-fib however there are premature atrial complexes and premature ventricular complexes noted.  Patient with history of the same on chart review  At this time, the patient will be provided with 40 mg of Lasix for diuresis and advised to take 40 mg tomorrow as well.  The patient will be advised to continue taking his regular dose of 20 mg of Lasix after tomorrow.  The patient was advised to follow-up with his cardiologist, Dr. Golden Hurter, for further management.  The patient voiced understanding of these instructions.  The patient had all of his questions answered his satisfaction prior to  discharge.  The patient is stable at this time for discharge home.  Final Clinical Impression(s) / ED Diagnoses Final diagnoses:  Shortness of breath    Rx / DC Orders ED Discharge Orders     None         Azucena Cecil, PA-C 03/13/22 [id number]    Lajean Saver, MD 03/14/22 1247

## 2022-03-13 NOTE — Discharge Instructions (Addendum)
Please return to the ED with any new or worsening signs or symptoms such as increased chest pain, shortness of breath Please take 40 mg of Lasix tomorrow as well.  Please continue taking 20 mg of Lasix after tomorrow morning. Please follow-up with your doctor/cardiology team

## 2022-03-14 NOTE — Progress Notes (Signed)
Cardiology Office Note:    Date:  03/15/2022   ID:  [name], DOB 12/14/1945, MRN [id number]  PCP:  Burman Freestone, MD   [facility] Providers Cardiologist:  Fransico Him, MD Electrophysiologist:  Constance Haw, MD     Referring MD: Burman Freestone, MD   Chief Complaint: shortness of breath  History of Present Illness:    [name] is a very pleasant 76 y.o. male with a hx of atrial fib/flutter, bicuspid aortic valve with aortic insufficiency, CAD s/p CABG 1998, CKD stage III, dilated ascending aorta and aortic root, HTN, OSA on CPAP (follows with neurology), HLD, mild carotid artery disease,   He originally had bypass surgery in [facility].  Has known bicuspid AV and dilated aortic root followed by [facility].  Was admitted to [facility] in early 2020 with NSTEMI.  Cath 08/2018 showed an occluded left radial graft to the RCA and patent LIMA to LAD with left to right collaterals.  Medical therapy was recommended.  He was having increased palpitations December 2020 with 30-day monitor showing episodes of atrial flutter in the 120s.  He was started on anticoagulation.  Echo 08/2018 showed EF 55 to 60%, mild asymmetric LVH, normal PASP, mild MR, mild to moderate AI, ascending aortic aneurysm 48 mm with aneurysm of aortic root measuring 50 mm.  He was seen in A-fib clinic and placed on amiodarone and underwent cardioversion.  He subsequently saw Dr. Curt Bears and underwent ablation of his atrial flutter 09/2018.  CT of chest 05/2019 also demonstrated nonspecific interstitial lung disease.  Seen by Dr. Jorene Minors 06/2021 for acute CHF exacerbation.  He complained of lower extremity edema and shortness of breath with weight gain of 7 pounds.  He was started on Lasix 40 mg daily which he responded well to.  He underwent atrial flutter ablation by Dr. Curt Bears on 08/02/2021.  Seen by Dr. Radford Pax on 08/28/2021.  He was last seen in our office on 02/27/2022 by Tommye Standard, PA.  He was post PVI  ablation 08/02/21. Amiodarone was stopped at office visit on 10/30/21 by Dr. Curt Bears. He reported on 8/9 that he was having a lot of problems with bleeding in his mouth from dental extractions, with anxiety. He reported palpitations that he did not think were A-fib.  He presented to ED on 03/13/22 for evaluation of chest pain and shortness of breath. BNP 276, negative hs troponin x 2.  EKG was nonischemic, no appearance of A-fib but he did have PACs and PVCs.  Imaging was unremarkable. He was advised to take 40 mg of Lasix for 2 days and follow-up with cardiology.  Today, he is here for follow-up of ED visit on 8/23. States he is very tired this morning, waited 8 hours at the ED and has not caught up on sleep. He denies chest pain, SOB,  palpitations today. States on 8/23 he additionally felt a little lightheaded. Has felt better since he took Lasix 40 mg x 2 doses. He denies edema, orthopnea, PND, presyncope, or syncope. Has been clearing his throat and it feels like heart is beating harder recently. Diagnosed with high metabolism 10 years ago, unable to gain weight.  Previously was very physically active, has not worked out as much recently. Spent part of the summer in [place] and [place] [facility] Occasional chest pressure that is not alarming to him. Has increased water consumption to 40-50 oz daily. Additionally drinks up to 3  Cokes and 8 oz juice daily.   Past Medical History:  Diagnosis Date   Anal fissure    Aortic insufficiency    Arthritis    Atrial flutter (HCC)    Bicuspid aortic valve    with mild to moderate AR by echo 08/2019   BPH (benign prostatic hyperplasia)    CAD (coronary artery disease)    s/p CABG [place]   Cataract    bilateral-removed   CKD (chronic kidney disease), stage III ([place])    Depression    Dilated aortic root (HCC)    History of blood transfusion 1998   "due to cardiac bypass surgery"   Hyperlipidemia    Hypertension     Hypogonadism in male    Kidney stones    Migraine    Mild carotid artery disease ([place]) 2009   Myocardial infarction ([place])    PVC's (premature ventricular contractions)    Retinal hemorrhage 11/04/2014   Sleep apnea with use of continuous positive airway pressure (CPAP)    Thoracic aortic aneurysm (TAA) ([place])     Past Surgical History:  Procedure Laterality Date   A-FLUTTER ABLATION N/A 09/29/2019   Procedure: A-FLUTTER ABLATION;  Surgeon: Constance Haw, MD;  Location: [place] CV LAB;  Service: Cardiovascular;  Laterality: N/A;   ATRIAL FIBRILLATION ABLATION N/A 08/02/2021   Procedure: ATRIAL FIBRILLATION ABLATION;  Surgeon: Constance Haw, MD;  Location: [place] [facility];  Service: Cardiovascular;  Laterality: N/A;   CARDIOVERSION N/A 08/10/2019   Procedure: CARDIOVERSION;  Surgeon: Elouise Munroe, MD;  Location: [facility] ENDOSCOPY;  Service: Cardiovascular;  Laterality: N/A;   CARDIOVERSION N/A 05/21/2021   Procedure: CARDIOVERSION;  Surgeon: Josue Hector, MD;  Location: [place];  Service: Cardiovascular;  Laterality: N/A;   CARDIOVERSION N/A 07/12/2021   Procedure: CARDIOVERSION;  Surgeon: Jerline Pain, MD;  Location: [facility] ENDOSCOPY;  Service: Cardiovascular;  Laterality: N/A;   cataracts     COLONOSCOPY     CORONARY ARTERY BYPASS GRAFT  1998   LEFT HEART CATH AND CORONARY ANGIOGRAPHY N/A 08/24/2018   Procedure: LEFT HEART CATH AND CORONARY ANGIOGRAPHY;  Surgeon: Lorretta Harp, MD;  Location: [place] [facility];  Service: Cardiovascular;  Laterality: N/A;   TEE WITHOUT CARDIOVERSION N/A 01/19/2015   Procedure: TRANSESOPHAGEAL ECHOCARDIOGRAM (TEE);  Surgeon: Sueanne Margarita, MD;  Location: [facility] ENDOSCOPY;  Service: Cardiovascular;  Laterality: N/A;   [place]   pt denies    Current Medications: Current Meds  Medication Sig   ALPRAZolam (ALPRAZOLAM XR) 1 MG 24 hr tablet Take 1 tablet (1 mg total) by mouth daily.    apixaban (ELIQUIS) 5 MG TABS tablet Take 1 tablet (5 mg total) by mouth 2 (two) times daily.   Ascorbic Acid (VITAMIN C) 1000 MG tablet Take 1,000 mg by mouth 2 (two) times daily.   buPROPion (WELLBUTRIN XL) 300 MG 24 hr tablet Take 300 mg by mouth daily.   butalbital-acetaminophen-caffeine (FIORICET) 50-325-40 MG tablet Take 1-2 tablets by mouth daily as needed for headache (take at onset of migraine. no more than 2 tablets in 24 hours.).   diazepam (VALIUM) 5 MG tablet Take 2.5-5 mg by mouth 2 (two) times daily as needed for anxiety.    furosemide (LASIX) 20 MG tablet Take 1 tablet (20 mg total) by mouth daily.   ibuprofen (ADVIL,MOTRIN) 200 MG tablet Take 200-400 mg by mouth every 8 (eight) hours as needed (for  arthritis in the feet).   losartan (COZAAR) 25 MG tablet TAKE ONE TABLET BY MOUTH DAILY   Nebivolol HCl (BYSTOLIC) 20 MG TABS Take 1 tablet (20 mg total) by mouth daily.   niacin 500 MG tablet Take 500 mg by mouth at bedtime.   rosuvastatin (CRESTOR) 20 MG tablet TAKE ONE TABLET BY MOUTH DAILY   Specialty Vitamins Products (CENTRUM SPECIALIST ENERGY) TABS Take 1 tablet by mouth daily with breakfast.   tadalafil (CIALIS) 5 MG tablet Take 5 mg by mouth every evening.   Testosterone 40.5 MG/2.5GM (1.62%) GEL Place 4 Pump onto the skin daily. Apply 4 pumps to each shoulder every morning   traZODone (DESYREL) 50 MG tablet Take 1.5 tablets (75 mg total) by mouth at bedtime.   Turmeric 500 MG CAPS Take 500 mg by mouth daily.   vitamin E 400 UNIT capsule Take 400 Units by mouth at bedtime.    [DISCONTINUED] amLODipine (NORVASC) 5 MG tablet Take 1.5 tablets (7.5 mg total) by mouth daily.     Allergies:   Dog epithelium allergy skin test and Mushroom extract complex   Social History   Socioeconomic History   Marital status: Divorced    Spouse name: Not on file   Number of children: 0   Years of education: Not on file   Highest education level: Not on file  Occupational History    Occupation: retired  Tobacco Use   Smoking status: Former    Years: 20.00    Types: Cigarettes    Quit date: 07/22/1986    Years since quitting: 35.6   Smokeless tobacco: Never   Tobacco comments:    Former smoker 05/09/2021  Vaping Use   Vaping Use: Never used  Substance and Sexual Activity   Alcohol use: Yes    Alcohol/week: 1.0 standard drink of alcohol    Types: 1 Glasses of wine per week    Comment: 0-1 daily   Drug use: No   Sexual activity: Not on file  Other Topics Concern   Not on file  Social History Narrative   Divorced   Secondary school teacher- retired   No children   Has a Neurosurgeon named [name]   Enjoys outdoor activities- hiking, swimming, Control and instrumentation engineer, baseball, writing, reading, cooking   Social Determinants of Radio broadcast assistant Strain: Not on Art therapist Insecurity: Not on file  Transportation Needs: Not on file  Physical Activity: Not on file  Stress: Not on file  Social Connections: Not on file     Family History: The patient's family history includes Arthritis in his father and mother; Diabetes in his father and mother; Heart disease (age of onset: 34) in his father; Hyperlipidemia in his father and mother; Hypertension in his father; Kidney disease in his paternal grandfather; Stroke (age of onset: 10) in his father. There is no history of Colon cancer, Colon polyps, Esophageal cancer, Stomach cancer, Rectal cancer, or Sleep apnea.  ROS:   Please see the history of present illness.  All other systems reviewed and are negative.  Labs/Other Studies Reviewed:    The following studies were reviewed today:  Device information AFlutter Diagnosed 2020 Nov 2020: CT with nonspecific ILD Amiodarone started Jan 2021  AFlutter ablation 09/29/2019  Amiodarone stopped April 2021 Afib found Oct 2022 Amiodrone restarted  Nov 2022 PVI ablation 08/02/21 Amiodarone stopped April 2023  Echo 08/23/21   1. Left ventricular ejection fraction, by estimation, is 60 to 65%.  Left  ventricular ejection fraction by  3D volume is 62 %. The left ventricle has  normal function. The left ventricle has no regional wall motion  abnormalities. There is moderate  concentric left ventricular hypertrophy. Left ventricular diastolic  parameters were normal. The average left ventricular global longitudinal  strain is -21.7 %. The global longitudinal strain is normal.   2. Right ventricular systolic function is normal. The right ventricular  size is normal. There is normal pulmonary artery systolic pressure. The  estimated right ventricular systolic pressure is 04.5 mmHg.   3. Left atrial size was mildly dilated.   4. The mitral valve is normal in structure. Trivial mitral valve  regurgitation. No evidence of mitral stenosis.   5. The aortic valve has an indeterminant number of cusps. Aortic valve  regurgitation is mild to moderate. Aortic valve sclerosis is present, with  no evidence of aortic valve stenosis. Aortic regurgitation PHT measures  797 msec.   6. Aortic dilatation noted. Aneurysm of the aortic root, measuring 46 mm.  Aneurysm of the ascending aorta, measuring 47 mm.   7. The inferior vena cava is normal in size with greater than 50%  respiratory variability, suggesting right atrial pressure of 3 mmHg.   A Fib Ablation 08/02/21  POSTPROCEDURE DIAGNOSES: 1. Persistent atrial fibrillation.   PROCEDURES: 1. Comprehensive electrophysiologic study. 2. Coronary sinus pacing and recording. 3. Three-dimensional mapping of atrial fibrillation (with additional mapping and ablation within the left atrium due to persistence of afib) 4. Ablation of atrial fibrillation (with additional mapping and ablation within the left atrium due to persistence of afib) 5. Intracardiac echocardiography. 6. Transseptal puncture of an intact septum. 7. Arrhythmia induction with pacing  CT Cardiac Morph/Pulm Vein Isolation 07/25/21  IMPRESSION: 1. There is normal pulmonary vein  drainage into the left atrium with ostial measurements above.   2. There is no thrombus in the left atrial appendage.   3. The esophagus runs in the left atrial midline and is not in proximity to any of the pulmonary vein ostia.   4. No PFO/ASD.   5. Normal coronary origin. Right dominance.   6. CAC score of 1923 which is 33 percentile for age-, race-, and sex-matched controls.   7. Proximal ascending aorta aneurysm (47.4 mm).   Recent Labs: 04/27/2021: TSH 2.760 07/17/2021: Magnesium 2.4; NT-Pro BNP 949 08/28/2021: ALT 25 03/12/2022: BUN 20; Creatinine, Ser 1.72; Hemoglobin 13.0; Platelets 229; Potassium 3.9; Sodium 139 03/13/2022: B Natriuretic Peptide 276.4  Recent Lipid Panel    Component Value Date/Time   CHOL 86 (L) 08/28/2021 0848   TRIG 83 08/28/2021 0848   HDL 46 08/28/2021 0848   CHOLHDL 1.9 08/28/2021 0848   CHOLHDL 2.8 08/22/2018 0645   VLDL 12 08/22/2018 0645   LDLCALC 23 08/28/2021 0848     Risk Assessment/Calculations:    CHA2DS2-VASc Score = 4   This indicates a 4.8% annual risk of stroke. The patient's score is based upon: CHF History: 1 HTN History: 1 Diabetes History: 0 Stroke History: 0 Vascular Disease History: 1 Age Score: 1 Gender Score: 0    Physical Exam:    VS:  BP (!) 98/58   Pulse (!) 49   Ht '5\' 7"'$  (1.702 m)   Wt 135 lb 3.2 oz (61.3 kg)   SpO2 98%   BMI 21.18 kg/m     Wt Readings from Last 3 Encounters:  03/15/22 135 lb 3.2 oz (61.3 kg)  03/13/22 136 lb (61.7 kg)  02/27/22 134 lb 9.6 oz (61.1 kg)  GEN:  Well nourished, well developed in no acute distress HEENT: Normal NECK: No JVD; No carotid bruits CARDIAC: RRR, soft S1 S2, no murmur clearly ausculatated. No rubs, gallops RESPIRATORY:  Clear to auscultation without rales, wheezing or rhonchi  ABDOMEN: Soft, non-tender, non-distended MUSCULOSKELETAL:  No edema; No deformity. 2+ pedal pulses, equal bilaterally SKIN: Warm and dry NEUROLOGIC:  Alert and oriented x  3 PSYCHIATRIC:  Normal affect   EKG:  EKG is not ordered today.  Reviewed EKG from 03/13/22 demonstrates frequent PACs and PVCs.   Diagnoses:    1. Persistent atrial fibrillation ([place])   2. Coronary artery disease involving native coronary artery of native heart without angina pectoris   3. Essential hypertension   4. SOB (shortness of breath)   5. Chronic kidney disease, unspecified CKD stage   6. PVC's (premature ventricular contractions)   7. Premature atrial contractions   8. Shortness of breath    Assessment and Plan:     Dyspnea: ED visit 8/23 for chest pain and shortness of breath.  BNP mildly elevated at 276. Had mild LE edema which has resolved. Was advised to increase Lasix to 40 mg x 2 doses to which he responded well, felt better. Has resumed Lasix 20 mg daily.  Appears euvolemic on exam today.  Denies orthopnea, PND.  Due to recent increase in shortness of breath responsive to diuretic therapy, we will go ahead and repeat echocardiogram to determine if he has worsening ventricular or valve function. Advised him to monitor for volume overload and take an additional 20 mg of Lasix as needed. Need to monitor frequency due to CKD.   Frequent PVCs/PACs: Seen on EKG from 8/23.  He notes some occasional feelings of heart beating harder.  No evidence of A-fib to his awareness.  We will place a 7-day monitor to quantify frequency of ectopic beats and for evidence of A-fib. Continue nebivolol.   CAD s/p CABG without angina: History of CABG in 1998 in [place].  Cardiac catheterization 08/2018 showed an occluded left radial artery graft to the RCA with a patent LIMA to LAD with left to right collaterals. Occasional chest pressure that is not concerning to him for angina. No indication for further ischemic evaluation at this time.  Not on aspirin in the setting of DOAC. Continue Crestor, nebivolol, losartan, amlodipine.  Hyperlipidemia LDL goal < 70: LDL 23 08/28/21. Continue  rosuvastatin.   Persistent a fib s/p ablation on chronic anticoagulation: No evidence of A-fib during recent ED visit. Feels heart beating harder, no palpitations. No evidence of a fib to his awareness. We will place a 7-day monitor for evaluation of frequent ectopy and also to identify any atrial fibrillation. No bleeding problems on Eliquis.  Continue nebivolol, Eliquis.  Bicuspid aortic valve/Aortic valve insufficiency: Echo 08/2021 showed an indeterminate number of cusps of the aortic valve with mild to moderate regurgitation.  Aortic valve sclerosis is present with no evidence of aortic valve stenosis.  Aortic regurgitation PHT measures 797 ms. I do not appreciate a significant murmur on exam today.  Updating echocardiogram as noted above due to increased SOB.  Aortic aneurysm: 4.8 to 5 cm aortic root and 4.7 cm fusiform ascending aortic aneurysm on CT 07/2021 felt to be stable in comparison to prior imaging.  Transferred care from [facility] to [facility] and seen by Dr. Cyndia Bent 09/19/21. He was advised to return in 1 year with CTA of chest prior. Sent a message to Dr. Vivi Martens office to ensure that  this is scheduled.  CKD Stage 3: GFR 41, creatinine 1.72 on 03/12/2022. Creatinine has remained elevated for greater than 6 months. This limits diuretic therapy.  We will refer him to nephrology.  Hypertension: BP is well-controlled today. Average home BP 117/61 per his monitor. Having some mild fatigue. We will reduce his amlodipine to 5 mg daily to see if fatigue improves.     Disposition: EP December or sooner if high PVC burden/6 months with Dr. Radford Pax  Medication Adjustments/Labs and Tests Ordered: Current medicines are reviewed at length with the patient today.  Concerns regarding medicines are outlined above.  Orders Placed This Encounter  Procedures   Ambulatory referral to Nephrology   ECHOCARDIOGRAM COMPLETE   Meds ordered this encounter  Medications   amLODipine (NORVASC) 5 MG tablet    Sig: Take 1  tablet (5 mg total) by mouth daily.    Dispense:  90 tablet    Refill:  3    Patient Instructions  Medication Instructions:   DECREASE Amlodipine one (1) tablet by mouth ( 5 mg ) daily.   *If you need a refill on your cardiac medications before your next appointment, please call your pharmacy*   Lab Work:  None ordered.  If you have labs (blood work) drawn today and your tests are completely normal, you will receive your results only by: [place] (if you have MyChart) OR A paper copy in the mail If you have any lab test that is abnormal or we need to change your treatment, we will call you to review the results.   Testing/Procedures:  Your physician has requested that you have an echocardiogram. Echocardiography is a painless test that uses sound waves to create images of your heart. It provides your doctor with information about the size and shape of your heart and how well your heart's chambers and valves are working. This procedure takes approximately one hour. There are no restrictions for this procedure.  ZIO XT- Long Term Monitor Instructions  Your physician has requested you wear a ZIO patch monitor for 7 days.  This is a single patch monitor. [employer] supplies one patch monitor per enrollment. Additional stickers are not available. Please do not apply patch if you will be having a Nuclear Stress Test,  Echocardiogram, Cardiac CT, MRI, or Chest Xray during the period you would be wearing the  monitor. The patch cannot be worn during these tests. You cannot remove and re-apply the  ZIO XT patch monitor.  Your ZIO patch monitor will be mailed 3 day USPS to your address on file. It may take 3-5 days  to receive your monitor after you have been enrolled.  Once you have received your monitor, please review the enclosed instructions. Your monitor  has already been registered assigning a specific monitor serial # to you.  Billing and Patient Assistance Program  Information  We have supplied [employer] with any of your insurance information on file for billing purposes. [employer] offers a sliding scale Patient Assistance Program for patients that do not have  insurance, or whose insurance does not completely cover the cost of the ZIO monitor.  You must apply for the Patient Assistance Program to qualify for this discounted rate.  To apply, please call [employer] at [phone], select option 4, select option 2, ask to apply for  Patient Assistance Program. Theodore Demark will ask your household income, and how many people  are in your household. They will quote your out-of-pocket cost based on that information.  [employer] will also be able to set up a 46-month interest-free payment plan if needed.  Applying the monitor   Shave hair from upper left chest.  Hold abrader disc by orange tab. Rub abrader in 40 strokes over the upper left chest as  indicated in your monitor instructions.  Clean area with 4 enclosed alcohol pads. Let dry.  Apply patch as indicated in monitor instructions. Patch will be placed under collarbone on left  side of chest with arrow pointing upward.  Rub patch adhesive wings for 2 minutes. Remove white label marked "1". Remove the white  label marked "2". Rub patch adhesive wings for 2 additional minutes.  While looking in a mirror, press and release button in center of patch. A small green light will  flash 3-4 times. This will be your only indicator that the monitor has been turned on.  Do not shower for the first 24 hours. You may shower after the first 24 hours.  Press the button if you feel a symptom. You will hear a small click. Record Date, Time and  Symptom in the Patient Logbook.  When you are ready to remove the patch, follow instructions on the last 2 pages of Patient  Logbook. Stick patch monitor onto the last page of Patient Logbook.  Place Patient Logbook in the blue and white box. Use locking tab on box and tape box closed   securely. The blue and white box has prepaid postage on it. Please place it in the mailbox as  soon as possible. Your physician should have your test results approximately 7 days after the  monitor has been [facility]  Call [place] [phone] you have questions regarding  your ZIO XT patch monitor. Call them immediately if you see an orange light blinking on your  monitor.  If your monitor falls off in less than 4 days, contact our Monitor department at [phone]  If your monitor becomes loose or falls off after 4 days call [employer] at [phone]  suggestions on securing your monitor    Follow-Up: [facility] you and your health needs are our priority.  As part of our continuing mission to provide you with exceptional heart care, we have created designated Provider Care Teams.  These Care Teams include your primary Cardiologist (physician) and Advanced Practice Providers (APPs -  Physician Assistants and Nurse Practitioners) who all work together to provide you with the care you need, when you need it.  We recommend signing up for the patient portal called "MyChart".  Sign up information is provided on this After Visit Summary.  MyChart is used to connect with patients for Virtual Visits (Telemedicine).  Patients are able to view lab/test results, encounter notes, upcoming appointments, etc.  Non-urgent messages can be sent to your provider as well.   To learn more about what you can do with MyChart, go to [email or web address]    Your next appointment:   6 month(s)  The format for your next appointment:   In Person  Provider:   TFransico Him MD     Other Instructions  Please keep follow up with Dr. CCurt Bearson December 12 @ 2:30 pm.   Heart Failure Education: Weigh yourself EVERY morning after you go to the bathroom but before you eat or drink anything. Write this number down in a weight log/diary. If you gain 3  pounds overnight or 5 pounds in a week, call the office if you have  swelling or shortness of breath.  Take your medicines as prescribed. If you have concerns about your medications, please call us before you stop taking them.  Eat low salt foods--Limit salt (sodium) to 2000 mg per day. This will help prevent your body from holding onto fluid. Read food labels as many processed foods have a lot of sodium, especially canned goods and prepackaged meats. If you would like some assistance choosing low sodium foods, we would be happy to set you up with a nutritionist. Stay as active as you can everyday. Staying active will give you more energy and make your muscles stronger. Start with 5 minutes at a time and work your way up to 30 minutes a day. Break up your activities--do some in the morning and some in the afternoon. Start with 3 days per week and work your way up to 5 days as you can.  If you have chest pain, feel short of breath, dizzy, or lightheaded, STOP. If you don't feel better after a short rest, call 911. If you do feel better, call the office to let us know you have symptoms with exercise. Limit all fluids for the day to less than 2 liters. Fluid includes all drinks, coffee, juice, ice chips, soup, jello, and all other liquids.   Important Information About Sugar         Signed, Emmaline Life, NP  03/15/2022 11:36 AM    [place]

## 2022-03-15 ENCOUNTER — Other Ambulatory Visit: Payer: Self-pay | Admitting: Nurse Practitioner

## 2022-03-15 ENCOUNTER — Encounter: Payer: Self-pay | Admitting: Nurse Practitioner

## 2022-03-15 ENCOUNTER — Ambulatory Visit (INDEPENDENT_AMBULATORY_CARE_PROVIDER_SITE_OTHER): Payer: Medicare Other | Admitting: Nurse Practitioner

## 2022-03-15 ENCOUNTER — Ambulatory Visit (INDEPENDENT_AMBULATORY_CARE_PROVIDER_SITE_OTHER): Payer: Medicare Other

## 2022-03-15 VITALS — BP 98/58 | HR 49 | Ht 67.0 in | Wt 135.2 lb

## 2022-03-15 DIAGNOSIS — I10 Essential (primary) hypertension: Secondary | ICD-10-CM

## 2022-03-15 DIAGNOSIS — I493 Ventricular premature depolarization: Secondary | ICD-10-CM

## 2022-03-15 DIAGNOSIS — R0602 Shortness of breath: Secondary | ICD-10-CM

## 2022-03-15 DIAGNOSIS — N189 Chronic kidney disease, unspecified: Secondary | ICD-10-CM

## 2022-03-15 DIAGNOSIS — I2510 Atherosclerotic heart disease of native coronary artery without angina pectoris: Secondary | ICD-10-CM

## 2022-03-15 DIAGNOSIS — I4819 Other persistent atrial fibrillation: Secondary | ICD-10-CM

## 2022-03-15 DIAGNOSIS — I491 Atrial premature depolarization: Secondary | ICD-10-CM

## 2022-03-15 MED ORDER — AMLODIPINE BESYLATE 5 MG PO TABS: 5.0000 mg | ORAL_TABLET | Freq: Every day | ORAL | 3 refills | Status: DC

## 2022-03-15 NOTE — Progress Notes (Unsigned)
ZIO XT serial # [id number] from office inventory applied to patient.  Dr. Curt Bears to read.

## 2022-03-15 NOTE — Patient Instructions (Signed)
Medication Instructions:   DECREASE Amlodipine one (1) tablet by mouth ( 5 mg ) daily.   *If you need a refill on your cardiac medications before your next appointment, please call your pharmacy*   Lab Work:  None ordered.  If you have labs (blood work) drawn today and your tests are completely normal, you will receive your results only by: [place] (if you have MyChart) OR A paper copy in the mail If you have any lab test that is abnormal or we need to change your treatment, we will call you to review the results.   Testing/Procedures:  Your physician has requested that you have an echocardiogram. Echocardiography is a painless test that uses sound waves to create images of your heart. It provides your doctor with information about the size and shape of your heart and how well your heart's chambers and valves are working. This procedure takes approximately one hour. There are no restrictions for this procedure.  ZIO XT- Long Term Monitor Instructions  Your physician has requested you wear a ZIO patch monitor for 7 days.  This is a single patch monitor. [employer] supplies one patch monitor per enrollment. Additional stickers are not available. Please do not apply patch if you will be having a Nuclear Stress Test,  Echocardiogram, Cardiac CT, MRI, or Chest Xray during the period you would be wearing the  monitor. The patch cannot be worn during these tests. You cannot remove and re-apply the  ZIO XT patch monitor.  Your ZIO patch monitor will be mailed 3 day USPS to your address on file. It may take 3-5 days  to receive your monitor after you have been enrolled.  Once you have received your monitor, please review the enclosed instructions. Your monitor  has already been registered assigning a specific monitor serial # to you.  Billing and Patient Assistance Program Information  We have supplied [employer] with any of your insurance information on file for billing  purposes. [employer] offers a sliding scale Patient Assistance Program for patients that do not have  insurance, or whose insurance does not completely cover the cost of the ZIO monitor.  You must apply for the Patient Assistance Program to qualify for this discounted rate.  To apply, please call [employer] at [phone], select option 4, select option 2, ask to apply for  Patient Assistance Program. Theodore Demark will ask your household income, and how many people  are in your household. They will quote your out-of-pocket cost based on that information.  [employer] will also be able to set up a 33-month interest-free payment plan if needed.  Applying the monitor   Shave hair from upper left chest.  Hold abrader disc by orange tab. Rub abrader in 40 strokes over the upper left chest as  indicated in your monitor instructions.  Clean area with 4 enclosed alcohol pads. Let dry.  Apply patch as indicated in monitor instructions. Patch will be placed under collarbone on left  side of chest with arrow pointing upward.  Rub patch adhesive wings for 2 minutes. Remove white label marked "1". Remove the white  label marked "2". Rub patch adhesive wings for 2 additional minutes.  While looking in a mirror, press and release button in center of patch. A small green light will  flash 3-4 times. This will be your only indicator that the monitor has been turned on.  Do not shower for the first 24 hours. You may shower after the first 24 hours.  Press the  button if you feel a symptom. You will hear a small click. Record Date, Time and  Symptom in the Patient Logbook.  When you are ready to remove the patch, follow instructions on the last 2 pages of Patient  Logbook. Stick patch monitor onto the last page of Patient Logbook.  Place Patient Logbook in the blue and white box. Use locking tab on box and tape box closed  securely. The blue and white box has prepaid postage on it. Please place it in the mailbox as  soon  as possible. Your physician should have your test results approximately 7 days after the  monitor has been mailed back to [facility].  Call [place] at [phone] if you have questions regarding  your ZIO XT patch monitor. Call them immediately if you see an orange light blinking on your  monitor.  If your monitor falls off in less than 4 days, contact our Monitor department at [phone].  If your monitor becomes loose or falls off after 4 days call [employer] at [phone] for  suggestions on securing your monitor    Follow-Up: At [facility], you and your health needs are our priority.  As part of our continuing mission to provide you with exceptional heart care, we have created designated Provider Care Teams.  These Care Teams include your primary Cardiologist (physician) and Advanced Practice Providers (APPs -  Physician Assistants and Nurse Practitioners) who all work together to provide you with the care you need, when you need it.  We recommend signing up for the patient portal called "MyChart".  Sign up information is provided on this After Visit Summary.  MyChart is used to connect with patients for Virtual Visits (Telemedicine).  Patients are able to view lab/test results, encounter notes, upcoming appointments, etc.  Non-urgent messages can be sent to your provider as well.   To learn more about what you can do with MyChart, go to [email or web address].    Your next appointment:   6 month(s)  The format for your next appointment:   In Person  Provider:   Fransico Him, MD     Other Instructions  Please keep follow up with Dr. Curt Bears on December 12 @ 2:30 pm.   Heart Failure Education: Weigh yourself EVERY morning after you go to the bathroom but before you eat or drink anything. Write this number down in a weight log/diary. If you gain 3 pounds overnight or 5 pounds in a week, call the office if you have swelling or shortness of breath.   Take your medicines as prescribed. If you have concerns about your medications, please call us before you stop taking them.  Eat low salt foods--Limit salt (sodium) to 2000 mg per day. This will help prevent your body from holding onto fluid. Read food labels as many processed foods have a lot of sodium, especially canned goods and prepackaged meats. If you would like some assistance choosing low sodium foods, we would be happy to set you up with a nutritionist. Stay as active as you can everyday. Staying active will give you more energy and make your muscles stronger. Start with 5 minutes at a time and work your way up to 30 minutes a day. Break up your activities--do some in the morning and some in the afternoon. Start with 3 days per week and work your way up to 5 days as you can.  If you have chest pain, feel short of breath, dizzy, or lightheaded, STOP. If you  don't feel better after a short rest, call 911. If you do feel better, call the office to let us know you have symptoms with exercise. Limit all fluids for the day to less than 2 liters. Fluid includes all drinks, coffee, juice, ice chips, soup, jello, and all other liquids.   Important Information About Sugar

## 2022-03-27 ENCOUNTER — Encounter: Payer: Self-pay | Admitting: Adult Health

## 2022-04-01 ENCOUNTER — Other Ambulatory Visit: Payer: Self-pay | Admitting: Neurology

## 2022-04-01 ENCOUNTER — Ambulatory Visit (HOSPITAL_COMMUNITY): Payer: Medicare Other | Attending: Cardiology

## 2022-04-01 DIAGNOSIS — I4819 Other persistent atrial fibrillation: Secondary | ICD-10-CM | POA: Diagnosis present

## 2022-04-01 DIAGNOSIS — R0602 Shortness of breath: Secondary | ICD-10-CM | POA: Diagnosis present

## 2022-04-01 DIAGNOSIS — N189 Chronic kidney disease, unspecified: Secondary | ICD-10-CM | POA: Diagnosis present

## 2022-04-01 DIAGNOSIS — I2510 Atherosclerotic heart disease of native coronary artery without angina pectoris: Secondary | ICD-10-CM | POA: Diagnosis present

## 2022-04-01 DIAGNOSIS — I10 Essential (primary) hypertension: Secondary | ICD-10-CM | POA: Insufficient documentation

## 2022-04-01 LAB — ECHOCARDIOGRAM COMPLETE
Area-P 1/2: 3.34 cm2
P 1/2 time: 486 msec
S' Lateral: 2.6 cm

## 2022-04-01 NOTE — Telephone Encounter (Addendum)
Spoke to Patient per Megan,NP ok to get refill Butalbital-Asa-Caffeine-Codeine  and will discuss medication management in NOV for f/u appointment Pt expressed understanding and thanked me for calling

## 2022-04-01 NOTE — Telephone Encounter (Signed)
Ok to get that refill and we can discuss in [facility]

## 2022-04-03 MED ORDER — BUTALBITAL-ASA-CAFF-CODEINE 50-325-40-30 MG PO CAPS: 1.0000 | ORAL_CAPSULE | ORAL | 0 refills | Status: DC | PRN

## 2022-04-03 NOTE — Addendum Note (Signed)
Addended by: Larey Seat on: 04/03/2022 06:26 PM   Modules accepted: Orders

## 2022-04-29 ENCOUNTER — Ambulatory Visit: Payer: Medicare Other | Admitting: Cardiology

## 2022-05-28 NOTE — Progress Notes (Unsigned)
PATIENT: [name] DOB: 07/24/1945  REASON FOR VISIT: follow up HISTORY FROM: patient PRIMARY NEUROLOGIST: Dr. Vickey Huger  Chief Complaint  Patient presents with   Follow-up    Rm 20, alone.  CPAP.  No concerns.      HISTORY OF PRESENT ILLNESS: Today 05/29/22: [name] is a 76 year old male with a history of migraine, insomnia and obstructive sleep apnea on CPAP.  He returns today for follow-up.  Migraines: Controlled. Using Fioricet with codeine 4 times a month.   Insomnia: xanax nightly- reports that he has never used xanax during the day.  Continues to take trazodone 75 mg at bedtime.  He does feel that his sleep has improved he is thinking about reducing his dose to 50 mg  OSA on CPAP: Download below.  Good compliance.    07/09/21: [name] is a 76 year old male with a history of migraines, insomnia, obstructive sleep apnea on CPAP.  He returns today for follow-up.  Migraines: Controlled. Using Fioricet with codeine  6-8 times a month.   Insomnia: xanax nightly, trazodone 50 mg helping but feels like an increased dose would be helpful.   OSA on CPAP: Download below. Sometimes mask will leak but he adjust it.   Patient has A.fib. has several tests coming up: cardioversion scheduled for 12/22, Cardiac scan on 1/4 and ablation 1/12 and Echo 2/7.    REVIEW OF SYSTEMS: Out of a complete 14 system review of symptoms, the patient complains only of the following symptoms, and all other reviewed systems are negative.   ALLERGIES: Allergies  Allergen Reactions   Dog Epithelium Allergy Skin Test Itching, Anxiety, Palpitations, Other (See Comments), Cough and Shortness Of Breath    Other reaction(s): Respiratory Distress (ALLERGY/intolerance), Wheezing (ALLERGY/intolerance)   Mushroom Extract Complex Nausea Only and Other (See Comments)    Severe vertigo and headaches    HOME MEDICATIONS: Outpatient Medications Prior to Visit  Medication Sig Dispense Refill    ALPRAZolam (ALPRAZOLAM XR) 1 MG 24 hr tablet TAKE 1 TABLET BY MOUTH DAILY 90 tablet 1   amLODipine (NORVASC) 5 MG tablet Take 1 tablet (5 mg total) by mouth daily. 90 tablet 3   apixaban (ELIQUIS) 5 MG TABS tablet Take 1 tablet (5 mg total) by mouth 2 (two) times daily. 60 tablet 11   Ascorbic Acid (VITAMIN C) 1000 MG tablet Take 1,000 mg by mouth 2 (two) times daily.     buPROPion (WELLBUTRIN XL) 300 MG 24 hr tablet Take 300 mg by mouth daily.     butalbital-aspirin-caffeine-codeine (FIORINAL WITH CODEINE) 50-325-40-30 MG capsule Take 1 capsule by mouth every 4 (four) hours as needed for pain. 30 capsule 0   diazepam (VALIUM) 5 MG tablet Take 2.5-5 mg by mouth 2 (two) times daily as needed for anxiety.      furosemide (LASIX) 20 MG tablet Take 1 tablet (20 mg total) by mouth daily. 90 tablet 3   ibuprofen (ADVIL,MOTRIN) 200 MG tablet Take 200-400 mg by mouth every 8 (eight) hours as needed (for arthritis in the feet).     losartan (COZAAR) 25 MG tablet TAKE ONE TABLET BY MOUTH DAILY 90 tablet 2   Nebivolol HCl (BYSTOLIC) 20 MG TABS Take 1 tablet (20 mg total) by mouth daily. 90 tablet 3   niacin 500 MG tablet Take 500 mg by mouth at bedtime.     rosuvastatin (CRESTOR) 20 MG tablet TAKE ONE TABLET BY MOUTH DAILY 90 tablet 3   Specialty Vitamins Products (  CENTRUM SPECIALIST ENERGY) TABS Take 1 tablet by mouth daily with breakfast.     tadalafil (CIALIS) 5 MG tablet Take 5 mg by mouth every evening.     Testosterone 40.5 MG/2.5GM (1.62%) GEL Place 4 Pump onto the skin daily. Apply 4 pumps to each shoulder every morning     traZODone (DESYREL) 50 MG tablet Take 1.5 tablets (75 mg total) by mouth at bedtime. 135 tablet 0   Turmeric 500 MG CAPS Take 500 mg by mouth daily.     vitamin E 400 UNIT capsule Take 400 Units by mouth at bedtime.      No facility-administered medications prior to visit.    PAST MEDICAL HISTORY: Past Medical History:  Diagnosis Date   Anal fissure    Aortic insufficiency     Arthritis    Atrial flutter (HCC)    Bicuspid aortic valve    with mild to moderate AR by echo 08/2019   BPH (benign prostatic hyperplasia)    CAD (coronary artery disease)    s/p CABG [place] 1998   Cataract    bilateral-removed   CKD (chronic kidney disease), stage III (HCC)    Depression    Dilated aortic root (HCC)    History of blood transfusion 1998   "due to cardiac bypass surgery"   Hyperlipidemia    Hypertension    Hypogonadism in male    Kidney stones    Migraine    Mild carotid artery disease (HCC) 2009   Myocardial infarction (HCC)    PVC's (premature ventricular contractions)    Retinal hemorrhage 11/04/2014   Sleep apnea with use of continuous positive airway pressure (CPAP)    Thoracic aortic aneurysm (TAA) (HCC)     PAST SURGICAL HISTORY: Past Surgical History:  Procedure Laterality Date   A-FLUTTER ABLATION N/A 09/29/2019   Procedure: A-FLUTTER ABLATION;  Surgeon: Regan Lemming, MD;  Location: [facility] INVASIVE CV LAB;  Service: Cardiovascular;  Laterality: N/A;   ATRIAL FIBRILLATION ABLATION N/A 08/02/2021   Procedure: ATRIAL FIBRILLATION ABLATION;  Surgeon: Regan Lemming, MD;  Location: [facility] INVASIVE CV LAB;  Service: Cardiovascular;  Laterality: N/A;   CARDIOVERSION N/A 08/10/2019   Procedure: CARDIOVERSION;  Surgeon: Parke Poisson, MD;  Location: [facility] ENDOSCOPY;  Service: Cardiovascular;  Laterality: N/A;   CARDIOVERSION N/A 05/21/2021   Procedure: CARDIOVERSION;  Surgeon: Wendall Stade, MD;  Location: [facility] ENDOSCOPY;  Service: Cardiovascular;  Laterality: N/A;   CARDIOVERSION N/A 07/12/2021   Procedure: CARDIOVERSION;  Surgeon: Jake Bathe, MD;  Location: [facility] ENDOSCOPY;  Service: Cardiovascular;  Laterality: N/A;   cataracts     COLONOSCOPY     CORONARY ARTERY BYPASS GRAFT  1998   LEFT HEART CATH AND CORONARY ANGIOGRAPHY N/A 08/24/2018   Procedure: LEFT HEART CATH AND CORONARY ANGIOGRAPHY;  Surgeon: Runell Gess, MD;   Location: [facility] INVASIVE CV LAB;  Service: Cardiovascular;  Laterality: N/A;   TEE WITHOUT CARDIOVERSION N/A 01/19/2015   Procedure: TRANSESOPHAGEAL ECHOCARDIOGRAM (TEE);  Surgeon: Quintella Reichert, MD;  Location: [facility] ENDOSCOPY;  Service: Cardiovascular;  Laterality: N/A;   TOOTH EXTRACTION     VARICOSE VEIN SURGERY  1984   pt denies    FAMILY HISTORY: Family History  Problem Relation Age of Onset   Arthritis Mother        deceased   Hyperlipidemia Mother    Diabetes Mother    Arthritis Father    Hyperlipidemia Father    Heart disease Father 53   Stroke Father  55       deceased   Hypertension Father    Diabetes Father    Kidney disease Paternal Grandfather    Colon cancer Neg Hx    Colon polyps Neg Hx    Esophageal cancer Neg Hx    Stomach cancer Neg Hx    Rectal cancer Neg Hx    Sleep apnea Neg Hx     SOCIAL HISTORY: Social History   Socioeconomic History   Marital status: Divorced    Spouse name: Not on file   Number of children: 0   Years of education: Not on file   Highest education level: Not on file  Occupational History   Occupation: retired  Tobacco Use   Smoking status: Former    Years: 20.00    Types: Cigarettes    Quit date: 07/22/1986    Years since quitting: 35.8   Smokeless tobacco: Never   Tobacco comments:    Former smoker 05/09/2021  Vaping Use   Vaping Use: Never used  Substance and Sexual Activity   Alcohol use: Yes    Alcohol/week: 1.0 standard drink of alcohol    Types: 1 Glasses of wine per week    Comment: 0-1 daily   Drug use: No   Sexual activity: Not on file  Other Topics Concern   Not on file  Social History Narrative   Divorced   Publishing rights manager- retired   No children   Has a Medical laboratory scientific officer named Kirt Boys   Enjoys outdoor activities- hiking, swimming, Media planner, baseball, writing, reading, cooking   Social Determinants of Corporate investment banker Strain: Not on file  Food Insecurity: Not on file  Transportation Needs: Not on file   Physical Activity: Not on file  Stress: Not on file  Social Connections: Not on file  Intimate Partner Violence: Not on file      PHYSICAL EXAM  Vitals:   05/29/22 0924  BP: 132/68  Pulse: (!) 57  Weight: 146 lb (66.2 kg)  Height: 5\' 7"  (1.702 m)   Body mass index is 22.87 kg/m.  Generalized: Well developed, in no acute distress    Neurological examination  Mentation: Alert oriented to time, place, history taking. Follows all commands speech and language fluent Cranial nerve II-XII: Extraocular movements were full, visual field were full on confrontational test Head turning and shoulder shrug  were normal and symmetric. Motor: The motor testing reveals 5 over 5 strength of all 4 extremities. Good symmetric motor tone is noted throughout.  Sensory: Sensory testing is intact to soft touch on all 4 extremities. No evidence of extinction is noted.  Gait and station: Gait is normal.    DIAGNOSTIC DATA (LABS, IMAGING, TESTING) - I reviewed patient records, labs, notes, testing and imaging myself where available.  Lab Results  Component Value Date   WBC 7.3 03/12/2022   HGB 13.0 03/12/2022   HCT 37.9 (L) 03/12/2022   MCV 101.3 (H) 03/12/2022   PLT 229 03/12/2022      Component Value Date/Time   NA 139 03/12/2022 2145   NA 138 09/07/2021 0841   K 3.9 03/12/2022 2145   CL 104 03/12/2022 2145   CO2 26 03/12/2022 2145   GLUCOSE 115 (H) 03/12/2022 2145   BUN 20 03/12/2022 2145   BUN 25 09/07/2021 0841   CREATININE 1.72 (H) 03/12/2022 2145   CALCIUM 9.0 03/12/2022 2145   PROT 6.2 08/28/2021 0848   ALBUMIN 4.1 08/28/2021 0848   AST 34 08/28/2021 0848  ALT 25 08/28/2021 0848   ALKPHOS 123 (H) 08/28/2021 0848   BILITOT 0.2 08/28/2021 0848   GFRNONAA 41 (L) 03/12/2022 2145   GFRAA 64 07/27/2020 0954   Lab Results  Component Value Date   CHOL 86 (L) 08/28/2021   HDL 46 08/28/2021   LDLCALC 23 08/28/2021   TRIG 83 08/28/2021   CHOLHDL 1.9 08/28/2021   Lab  Results  Component Value Date   HGBA1C 5.9 (H) 08/22/2018   Lab Results  Component Value Date   VITAMINB12 721 09/24/2018   Lab Results  Component Value Date   TSH 2.760 04/27/2021      ASSESSMENT AND PLAN 76 y.o. year old male  has a past medical history of Anal fissure, Aortic insufficiency, Arthritis, Atrial flutter (HCC), Bicuspid aortic valve, BPH (benign prostatic hyperplasia), CAD (coronary artery disease), Cataract, CKD (chronic kidney disease), stage III (HCC), Depression, Dilated aortic root (HCC), History of blood transfusion (1998), Hyperlipidemia, Hypertension, Hypogonadism in male, Kidney stones, Migraine, Mild carotid artery disease (HCC) (2009), Myocardial infarction (HCC), PVC's (premature ventricular contractions), Retinal hemorrhage (11/04/2014), Sleep apnea with use of continuous positive airway pressure (CPAP), and Thoracic aortic aneurysm (TAA) (HCC). here with:  OSA on CPAP  - CPAP compliance excellent - Good treatment of AHI  - Encourage patient to use CPAP nightly and > 4 hours each night -Order sent for new machine   2.  Migraine headaches  -Controlled -Continue Fioricet with codeine- cautioned about rebound headaches  3.  Insomnia  -Continue Xanax XR 1 mg at bedtime -do a trial reducing trazodone to 50 mg at bedtime  FU 6 months with Dr. Vergia Alcon, MSN, NP-C 05/29/2022, 9:42 AM [facility] will give [address] [place], [place] [address] [phone]

## 2022-05-29 ENCOUNTER — Ambulatory Visit (INDEPENDENT_AMBULATORY_CARE_PROVIDER_SITE_OTHER): Payer: Medicare Other | Admitting: Adult Health

## 2022-05-29 ENCOUNTER — Encounter: Payer: Self-pay | Admitting: Adult Health

## 2022-05-29 VITALS — BP 132/68 | HR 57 | Ht 67.0 in | Wt 146.0 lb

## 2022-05-29 DIAGNOSIS — G4733 Obstructive sleep apnea (adult) (pediatric): Secondary | ICD-10-CM

## 2022-05-29 DIAGNOSIS — G43E01 Chronic migraine with aura, not intractable, with status migrainosus: Secondary | ICD-10-CM

## 2022-05-29 DIAGNOSIS — F5104 Psychophysiologic insomnia: Secondary | ICD-10-CM | POA: Diagnosis not present

## 2022-06-06 ENCOUNTER — Encounter (INDEPENDENT_AMBULATORY_CARE_PROVIDER_SITE_OTHER): Payer: Self-pay

## 2022-06-06 ENCOUNTER — Encounter (INDEPENDENT_AMBULATORY_CARE_PROVIDER_SITE_OTHER): Payer: Medicare Other | Admitting: Ophthalmology

## 2022-06-11 ENCOUNTER — Ambulatory Visit: Payer: Medicare Other | Admitting: Cardiology

## 2022-06-11 ENCOUNTER — Encounter: Payer: Self-pay | Admitting: Adult Health

## 2022-07-02 ENCOUNTER — Ambulatory Visit: Payer: Medicare Other | Admitting: Cardiology

## 2022-08-05 ENCOUNTER — Other Ambulatory Visit: Payer: Self-pay | Admitting: Surgery

## 2022-08-05 DIAGNOSIS — I712 Thoracic aortic aneurysm, without rupture, unspecified: Secondary | ICD-10-CM

## 2022-08-06 ENCOUNTER — Encounter: Payer: Self-pay | Admitting: Cardiology

## 2022-08-07 ENCOUNTER — Encounter: Payer: Self-pay | Admitting: Cardiology

## 2022-08-07 NOTE — Telephone Encounter (Signed)
Error

## 2022-08-08 ENCOUNTER — Telehealth: Payer: Self-pay | Admitting: *Deleted

## 2022-08-08 NOTE — Telephone Encounter (Signed)
     Primary Cardiologist: Fransico Him, MD  Chart reviewed as part of pre-operative protocol coverage. Given past medical history and time since last visit, based on ACC/AHA guidelines, [name] would be at acceptable risk for the planned procedure without further cardiovascular testing.   Patient was advised that if he develops new symptoms prior to surgery to contact our office to arrange a follow-up appointment.  He verbalized understanding.  Patient with diagnosis of A Fib on Eliquis for anticoagulation.     Procedure: 6 EXTRACTIONS AND IMPLANTS ON 4  Date of procedure: 08/12/22     CHA2DS2-VASc Score = 4  This indicates a 4.8% annual risk of stroke. The patient's score is based upon: CHF History: 1 HTN History: 1 Diabetes History: 0 Stroke History: 0 Vascular Disease History: 1 Age Score: 1 Gender Score: 0     CrCl 33 mL/min Platelet count 229K   Patient does not require pre-op antibiotics for dental procedure.   Per office protocol, patient can hold Eliquis for 2 days prior to procedure.     Patient will not need bridging with Lovenox (enoxaparin) around procedure.  I will route this recommendation to the requesting party via Epic fax function and remove from pre-op pool.  Please call with questions.  Jossie Ng. Jola Critzer NP-C     08/08/2022, 10:59 AM [address] Office [phone] Fax [phone]

## 2022-08-08 NOTE — Telephone Encounter (Signed)
   Pre-operative Risk Assessment    Patient Name: [name]  DOB: 1946/04/23 MRN: [id number]     Request for Surgical Clearance    Procedure:  Dental Extraction - Amount of Teeth to be Pulled:  6 EXTRACTIONS AND IMPLANTS ON 4  Date of Surgery:  Clearance 08/12/22                                 Surgeon:  Gennaro Africa, DDS Surgeon's Group or Practice Name:  [facility] Phone number:   Fax number:  [phone]   Type of Clearance Requested:   - Pharmacy:  Hold Apixaban (Eliquis) 'S 3 DAYS PRIOR, PT CAN REMAIN ON ASPIRIN    Type of Anesthesia:  Local    Additional requests/questions:    Signed, Jeanann Lewandowsky   08/08/2022, 6:56 AM

## 2022-08-08 NOTE — Telephone Encounter (Signed)
Patient with diagnosis of A Fib on Eliquis for anticoagulation.    Procedure: 6 EXTRACTIONS AND IMPLANTS ON 4  Date of procedure: 08/12/22   CHA2DS2-VASc Score = 4  This indicates a 4.8% annual risk of stroke. The patient's score is based upon: CHF History: 1 HTN History: 1 Diabetes History: 0 Stroke History: 0 Vascular Disease History: 1 Age Score: 1 Gender Score: 0   CrCl 33 mL/min Platelet count 229K  Patient does not require pre-op antibiotics for dental procedure.  Per office protocol, patient can hold Eliquis for 2 days prior to procedure.    Patient will not need bridging with Lovenox (enoxaparin) around procedure.  **This guidance is not considered finalized until pre-operative APP has relayed final recommendations.**

## 2022-08-20 ENCOUNTER — Other Ambulatory Visit: Payer: Self-pay | Admitting: Cardiology

## 2022-08-21 ENCOUNTER — Other Ambulatory Visit: Payer: Self-pay | Admitting: Cardiology

## 2022-08-26 ENCOUNTER — Encounter: Payer: Self-pay | Admitting: Cardiology

## 2022-08-26 ENCOUNTER — Ambulatory Visit (HOSPITAL_COMMUNITY): Payer: Medicare Other | Attending: Cardiology

## 2022-08-26 DIAGNOSIS — E7800 Pure hypercholesterolemia, unspecified: Secondary | ICD-10-CM

## 2022-08-26 DIAGNOSIS — I483 Typical atrial flutter: Secondary | ICD-10-CM

## 2022-08-26 DIAGNOSIS — I2510 Atherosclerotic heart disease of native coronary artery without angina pectoris: Secondary | ICD-10-CM | POA: Diagnosis present

## 2022-08-26 DIAGNOSIS — I351 Nonrheumatic aortic (valve) insufficiency: Secondary | ICD-10-CM | POA: Diagnosis present

## 2022-08-26 DIAGNOSIS — I10 Essential (primary) hypertension: Secondary | ICD-10-CM | POA: Insufficient documentation

## 2022-08-26 DIAGNOSIS — I5032 Chronic diastolic (congestive) heart failure: Secondary | ICD-10-CM | POA: Diagnosis present

## 2022-08-26 DIAGNOSIS — I7121 Aneurysm of the ascending aorta, without rupture: Secondary | ICD-10-CM | POA: Insufficient documentation

## 2022-08-26 LAB — ECHOCARDIOGRAM COMPLETE
Area-P 1/2: 4.08 cm2
MV M vel: 4.71 m/s
MV Peak grad: 88.7 mmHg
P 1/2 time: 554 msec
S' Lateral: 2.8 cm

## 2022-08-30 ENCOUNTER — Other Ambulatory Visit: Payer: Self-pay

## 2022-08-30 MED ORDER — NEBIVOLOL HCL 20 MG PO TABS: 20.0000 mg | ORAL_TABLET | Freq: Every day | ORAL | 2 refills | Status: DC

## 2022-09-03 ENCOUNTER — Ambulatory Visit: Payer: Medicare Other | Admitting: Cardiology

## 2022-09-11 ENCOUNTER — Ambulatory Visit: Payer: Medicare Other | Admitting: Surgery

## 2022-09-11 ENCOUNTER — Ambulatory Visit
Admission: RE | Admit: 2022-09-11 | Discharge: 2022-09-11 | Disposition: A | Discharge status: Home / Self Care | Payer: Medicare Other | Source: Ambulatory Visit | Attending: Surgery | Admitting: Surgery

## 2022-09-11 DIAGNOSIS — I712 Thoracic aortic aneurysm, without rupture, unspecified: Secondary | ICD-10-CM

## 2022-09-11 MED ORDER — IOPAMIDOL (ISOVUE-370) INJECTION 76%
60.0000 mL | Freq: Once | INTRAVENOUS | Status: AC | PRN
  Administered 2022-09-11: 60 mL via INTRAVENOUS

## 2022-09-16 ENCOUNTER — Encounter: Payer: Self-pay | Admitting: Cardiology

## 2022-09-16 ENCOUNTER — Ambulatory Visit: Payer: Medicare Other | Attending: Cardiology | Admitting: Cardiology

## 2022-09-16 VITALS — BP 140/64 | HR 49 | Ht 67.0 in | Wt 143.0 lb

## 2022-09-16 DIAGNOSIS — I4819 Other persistent atrial fibrillation: Secondary | ICD-10-CM | POA: Diagnosis not present

## 2022-09-16 DIAGNOSIS — I2510 Atherosclerotic heart disease of native coronary artery without angina pectoris: Secondary | ICD-10-CM | POA: Diagnosis not present

## 2022-09-16 DIAGNOSIS — I483 Typical atrial flutter: Secondary | ICD-10-CM | POA: Diagnosis not present

## 2022-09-16 DIAGNOSIS — I10 Essential (primary) hypertension: Secondary | ICD-10-CM | POA: Insufficient documentation

## 2022-09-16 DIAGNOSIS — D6869 Other thrombophilia: Secondary | ICD-10-CM | POA: Insufficient documentation

## 2022-09-16 NOTE — Patient Instructions (Signed)
Medication Instructions:  Your physician recommends that you continue on your current medications as directed. Please refer to the Current Medication list given to you today.  *If you need a refill on your cardiac medications before your next appointment, please call your pharmacy*   Lab Work: None ordered    Testing/Procedures: None ordered   Follow-Up: At [facility], you and your health needs are our priority.  As part of our continuing mission to provide you with exceptional heart care, we have created designated Provider Care Teams.  These Care Teams include your primary Cardiologist (physician) and Advanced Practice Providers (APPs -  Physician Assistants and Nurse Practitioners) who all work together to provide you with the care you need, when you need it.  Your next appointment:   6 month(s)  The format for your next appointment:   In Person  Provider:   You will see one of the following Advanced Practice Providers on your designated Care Team:   Tommye Standard, [place] Legrand Como "Jonni Sanger" Chalmers Cater, [place]   Thank you for choosing [facility]!!   Trinidad Curet, RN [phone]

## 2022-09-16 NOTE — Progress Notes (Signed)
Electrophysiology Office Note   Date:  09/16/2022   ID:  [name], DOB 01-04-1946, MRN [id number]  PCP:  Burman Freestone, MD  Cardiologist: Radford Pax Primary Electrophysiologist:  Syncere Kaminski Meredith Leeds, MD    Chief Complaint: Atrial flutter   History of Present Illness: [name] is a 77 y.o. male who is being seen today for the evaluation of atrial flutter at the request of Burman Freestone, MD. Presenting today for electrophysiology evaluation.  He has a history stated for coronary artery disease post CABG in 1998, bicuspid aortic valve with mild to severe AI, dilated aortic root, hypertension, hyperlipidemia, OSA, atrial flutter.  He is post ablation for atrial flutter 09/29/2019.  He presented to cardiology clinic in atrial fibrillation is post ablation 08/02/2021.  Today, denies symptoms of palpitations, chest pain, shortness of breath, orthopnea, PND, lower extremity edema, claudication, dizziness, presyncope, syncope, bleeding, or neurologic sequela. The patient is tolerating medications without difficulties.  He has been feeling well.  He has noted no further episodes of atrial fibrillation or atrial flutter.  Overall quite happy with his control.   Past Medical History:  Diagnosis Date   Anal fissure    Aortic insufficiency    Arthritis    Atrial flutter (HCC)    Bicuspid aortic valve    with mild to moderate AR by echo 08/2019   BPH (benign prostatic hyperplasia)    CAD (coronary artery disease)    s/p CABG [facility] [place]   Cataract    bilateral-removed   CKD (chronic kidney disease), stage III ([place])    Depression    Dilated aortic root (HCC)    History of blood transfusion 1998   "due to cardiac bypass surgery"   Hyperlipidemia    Hypertension    Hypogonadism in male    Kidney stones    Migraine    Mild carotid artery disease ([place]) 2009   Myocardial infarction ([place])    PVC's (premature ventricular contractions)    Retinal hemorrhage 11/04/2014    Sleep apnea with use of continuous positive airway pressure (CPAP)    Thoracic aortic aneurysm (TAA) ([place])    21m aoartic root and 429mascending aorta by echo 08/2022   Past Surgical History:  Procedure Laterality Date   A-FLUTTER ABLATION N/A 09/29/2019   Procedure: A-FLUTTER ABLATION;  Surgeon: CaConstance HawMD;  [facility];  Service: Cardiovascular;  Laterality: N/A;   ATRIAL FIBRILLATION ABLATION N/A 08/02/2021   Procedure: ATRIAL FIBRILLATION ABLATION;  Surgeon: CaConstance HawMD;  [facility];  Service: Cardiovascular;  Laterality: N/A;   CARDIOVERSION N/A 08/10/2019   Procedure: CARDIOVERSION;  Surgeon: AcElouise MunroeMD;  [facility];  Service: Cardiovascular;  Laterality: N/A;   CARDIOVERSION N/A 05/21/2021   Procedure: CARDIOVERSION;  Surgeon: NiJosue HectorMD;  [facility] Service: Cardiovascular;  Laterality: N/A;   CARDIOVERSION N/A 07/12/2021   Procedure: CARDIOVERSION;  Surgeon: SkJerline PainMD;  [facility];  Service: Cardiovascular;  Laterality: N/A;   cataracts     COLONOSCOPY     CORONARY ARTERY BYPASS GRAFT  1998   LEFT HEART CATH AND CORONARY ANGIOGRAPHY N/A 08/24/2018   Procedure: LEFT HEART CATH AND CORONARY ANGIOGRAPHY;  Surgeon: BeLorretta HarpMD;  [facility];  Service: Cardiovascular;  Laterality: N/A;   TEE WITHOUT CARDIOVERSION N/A 01/19/2015   Procedure: TRANSESOPHAGEAL ECHOCARDIOGRAM (TEE);  Surgeon: TrSueanne MargaritaMD;  [facility];  Service: Cardiovascular;  Laterality: N/A;   [place]   pt denies     Current Outpatient Medications  Medication Sig Dispense Refill   ALPRAZolam (ALPRAZOLAM XR) 1 MG 24 hr tablet TAKE 1 TABLET BY MOUTH DAILY 90 tablet 1   amLODipine (NORVASC) 5 MG tablet Take 1 tablet (5 mg total) by mouth daily. 90 tablet 2   apixaban (ELIQUIS) 5 MG TABS tablet Take 1  tablet (5 mg total) by mouth 2 (two) times daily. 60 tablet 11   Ascorbic Acid (VITAMIN C) 1000 MG tablet Take 1,000 mg by mouth 2 (two) times daily.     buPROPion (WELLBUTRIN XL) 300 MG 24 hr tablet Take 300 mg by mouth daily.     butalbital-aspirin-caffeine-codeine (FIORINAL WITH CODEINE) 50-325-40-30 MG capsule Take 1 capsule by mouth every 4 (four) hours as needed for pain. 30 capsule 0   diazepam (VALIUM) 5 MG tablet Take 2.5-5 mg by mouth 2 (two) times daily as needed for anxiety.      furosemide (LASIX) 20 MG tablet TAKE ONE TABLET BY MOUTH DAILY 90 tablet 1   ibuprofen (ADVIL,MOTRIN) 200 MG tablet Take 200-400 mg by mouth every 8 (eight) hours as needed (for arthritis in the feet).     losartan (COZAAR) 25 MG tablet TAKE ONE TABLET BY MOUTH DAILY 90 tablet 2   Nebivolol HCl (BYSTOLIC) 20 MG TABS Take 1 tablet (20 mg total) by mouth daily. 90 tablet 2   niacin 500 MG tablet Take 500 mg by mouth at bedtime.     rosuvastatin (CRESTOR) 20 MG tablet TAKE ONE TABLET BY MOUTH DAILY 90 tablet 3   Specialty Vitamins Products (CENTRUM SPECIALIST ENERGY) TABS Take 1 tablet by mouth daily with breakfast.     tadalafil (CIALIS) 5 MG tablet Take 5 mg by mouth every evening.     Testosterone 40.5 MG/2.5GM (1.62%) GEL Place 4 Pump onto the skin daily. Apply 4 pumps to each shoulder every morning     traZODone (DESYREL) 50 MG tablet Take 1.5 tablets (75 mg total) by mouth at bedtime. 135 tablet 0   Turmeric 500 MG CAPS Take 500 mg by mouth daily.     vitamin E 400 UNIT capsule Take 400 Units by mouth at bedtime.      No current facility-administered medications for this visit.    Allergies:   Dog epithelium allergy skin test and Mushroom extract complex   Social History:  The patient  reports that he quit smoking about 36 years ago. His smoking use included cigarettes. He has never used smokeless tobacco. He reports current alcohol use of about 1.0 standard drink of alcohol per week. He reports that he  does not use drugs.   Family History:  The patient's family history includes Arthritis in his father and mother; Diabetes in his father and mother; Heart disease (age of onset: 67) in his father; Hyperlipidemia in his father and mother; Hypertension in his father; Kidney disease in his paternal grandfather; Stroke (age of onset: 32) in his father.   ROS:  Please see the history of present illness.   Otherwise, review of systems is positive for none.   All other systems are reviewed and negative.   PHYSICAL EXAM: VS:  BP (!) 140/64   Pulse (!) 49   Ht '5\' 7"'$  (1.702 m)   Wt 143 lb (64.9 kg)   SpO2 98%   BMI 22.40  kg/m  , BMI Body mass index is 22.4 kg/m. GEN: Well nourished, well developed, in no acute distress  HEENT: normal  Neck: no JVD, carotid bruits, or masses Cardiac: RRR; no murmurs, rubs, or gallops,no edema  Respiratory:  clear to auscultation bilaterally, normal work of breathing GI: soft, nontender, nondistended, + BS MS: no deformity or atrophy  Skin: warm and dry Neuro:  Strength and sensation are intact Psych: euthymic mood, full affect  EKG:  EKG is ordered today. Personal review of the ekg ordered shows sinus rhythm   Recent Labs: 03/12/2022: BUN 20; Creatinine, Ser 1.72; Hemoglobin 13.0; Platelets 229; Potassium 3.9; Sodium 139 03/13/2022: B Natriuretic Peptide 276.4    Lipid Panel     Component Value Date/Time   CHOL 86 (L) 08/28/2021 0848   TRIG 83 08/28/2021 0848   HDL 46 08/28/2021 0848   CHOLHDL 1.9 08/28/2021 0848   CHOLHDL 2.8 08/22/2018 0645   VLDL 12 08/22/2018 0645   LDLCALC 23 08/28/2021 0848     Wt Readings from Last 3 Encounters:  09/16/22 143 lb (64.9 kg)  05/29/22 146 lb (66.2 kg)  03/15/22 135 lb 3.2 oz (61.3 kg)      Other studies Reviewed: Additional studies/ records that were reviewed today include: TTE 08/23/2021 Review of the above records today demonstrates:   1. Left ventricular ejection fraction, by estimation, is 60 to 65%.  Left  ventricular ejection fraction by 3D volume is 62 %. The left ventricle has  normal function. The left ventricle has no regional wall motion  abnormalities. There is moderate  concentric left ventricular hypertrophy. Left ventricular diastolic  parameters were normal. The average left ventricular global longitudinal  strain is -21.7 %. The global longitudinal strain is normal.   2. Right ventricular systolic function is normal. The right ventricular  size is normal. There is normal pulmonary artery systolic pressure. The  estimated right ventricular systolic pressure is [id number] mmHg.   3. Left atrial size was mildly dilated.   4. The mitral valve is normal in structure. Trivial mitral valve  regurgitation. No evidence of mitral stenosis.   5. The aortic valve has an indeterminant number of cusps. Aortic valve  regurgitation is mild to moderate. Aortic valve sclerosis is present, with  no evidence of aortic valve stenosis. Aortic regurgitation PHT measures  797 msec.   6. Aortic dilatation noted. Aneurysm of the aortic root, measuring 46 mm.  Aneurysm of the ascending aorta, measuring 47 mm.   7. The inferior vena cava is normal in size with greater than 50%  respiratory variability, suggesting right atrial pressure of 3 mmHg.   LHC 08/24/18  Ost LAD lesion is 100% stenosed. Ost RCA to Prox RCA lesion is 100% stenosed. Origin lesion is 100% stenosed. Prox Cx lesion is 50% stenosed.  Monitor 08/04/18 personally reviewed Sinus bradycardia, normal sinus rhythm and sinus tachycardia. The average heart rate was 67bpm. The heart rate ranged from 43 to 135bpm. Frequent PVCs single, bigeminal and trigeminal and nonsustained ventricular tachycardia up to 4 beats. Paroxysmal atrial flutter < 1% burden.  ASSESSMENT AND PLAN:  1.  Typical atrial flutter: CHA2DS2-VASc of 3.  Status post ablation 04/23/2020.  No obvious recurrence.  2.  Persistent atrial fibrillation: Currently on Eliquis and  nebivolol.  Status post ablation 08/02/2021.  Mains in sinus rhythm.  No changes.  3.  Coronary artery disease: No current angina  4.  Hypertension: Mildly elevated today.  Usually well-controlled.  No changes.  5.  Secondary hypercoagulable state: Currently on Eliquis for atrial fibrillation   Current medicines are reviewed at length with the patient today.   The patient does not have concerns regarding his medicines.  The following changes were made today: None  Labs/ tests ordered today include:  Orders Placed This Encounter  Procedures   EKG 12-Lead      Disposition:   FU 6 months  Signed, Althea Backs Meredith Leeds, MD  09/16/2022 12:09 PM     [address] [place] [address] [phone] (office) [phone] (fax)

## 2022-09-21 ENCOUNTER — Other Ambulatory Visit: Payer: Self-pay | Admitting: Neurology

## 2022-09-25 ENCOUNTER — Encounter: Payer: Self-pay | Admitting: Surgery

## 2022-09-25 ENCOUNTER — Ambulatory Visit (INDEPENDENT_AMBULATORY_CARE_PROVIDER_SITE_OTHER): Payer: Medicare Other | Admitting: Surgery

## 2022-09-25 VITALS — BP 147/75 | HR 61 | Resp 18 | Ht 67.0 in | Wt 141.0 lb

## 2022-09-25 DIAGNOSIS — I7121 Aneurysm of the ascending aorta, without rupture: Secondary | ICD-10-CM

## 2022-09-25 NOTE — Progress Notes (Signed)
Office Visit    Patient Name: [name] Date of Encounter: 09/26/2022  PCP:  Zoila Shutter, MD   [facility]  Cardiologist:  Armanda Magic, MD  Advanced Practice Provider:  No care team member to display Electrophysiologist:  Will Jorja Loa, MD   HPI    [name] is a 77 y.o. male with a past medical history significant for atrial flutter coronary artery disease status post CABG in [facility] 1998, bicuspid aortic valve with mild to severe AI, dilated aortic root, OSA hyperlipidemia, hypertension, mild carotid artery disease presents today for follow-up appointment.   He was last seen by electrophysiology 09/16/2022 (Dr. Elberta Fortis) for evaluation of atrial flutter.  He is post ablation for atrial flutter 09/29/2019.  Presented to cardiology clinic in atrial fibrillation 08/02/2021.  At the time of his last appointment he denied symptoms of palpitations, chest pain, shortness of breath, orthopnea, PND, lower extremity edema, claudication, dizziness, syncope, or presyncope.  No bleeding or neurologic sequelae.  Patient tolerating medications without difficulty.  Has been feeling well.  No further episodes of atrial fibrillation or atrial flutter.  No changes were made at that time to his medical regimen  Today, he tells me that he has no symptoms.  Doing well from a heart standpoint.  [facility]  No chest pain or shortness of breath.  He does take a little longer to stop bleeding since he is on Eliquis.  He is having a lot of trouble emotionally since his girlfriend of 5 years broke up with him for another man.  He is experiencing a lot of loneliness.  He is seeing a Warden/ranger.  We discussed him having a lipid panel and LFTs on a different day when he is fasted.  Reports no shortness of breath nor dyspnea on exertion. Reports no chest pain, pressure, or tightness. No edema, orthopnea, PND. Reports no palpitations.     Past Medical History     Past Surgical History:  Procedure Laterality Date   A-FLUTTER ABLATION N/A 09/29/2019   Procedure: A-FLUTTER ABLATION;  Surgeon: Regan Lemming, MD;  [facility];  Service: Cardiovascular;  Laterality: N/A;   ATRIAL FIBRILLATION ABLATION N/A 08/02/2021   Procedure: ATRIAL FIBRILLATION ABLATION;  Surgeon: Regan Lemming, MD;  [facility];  Service: Cardiovascular;  Laterality: N/A;   CARDIOVERSION N/A 08/10/2019   Procedure: CARDIOVERSION;  Surgeon: Parke Poisson, MD;  Location: [facility] ENDOSCOPY;  Service: Cardiovascular;  Laterality: N/A;   CARDIOVERSION N/A 05/21/2021   Procedure: CARDIOVERSION;  Surgeon: Wendall Stade, MD;  Location[facility]COPY;  Service: Cardiovascular;  Laterality: N/A;   CARDIOVERSION N/A 07/12/2021   Procedure: CARDIOVERSION;  Surgeon: Jake Bathe, MD;  Location: [facility]OSCOPY;  Service: Cardiovascular;  Laterality: N/A;   cataracts     COLONOSCOPY     CORONARY ARTERY BYPASS GRAFT  1998   LEFT HEART CATH AND CORONARY ANGIOGRAPHY N/A 08/24/2018   Procedure: LEFT HEART CATH AND CORONARY ANGIOGRAPHY;  Surgeon: Runell Gess, MD;  [facility];  Service: Cardiovascular;  Laterality: N/A;   TEE WITHOUT CARDIOVERSION N/A 01/19/2015   Procedure: TRANSESOPHAGEAL ECHOCARDIOGRAM (TEE);  Surgeon: Quintella Reichert, MD;  Location[facility];  Service: Cardiovascular;  Laterality: N/A;   TOOTH EXTRACTION     VARICOSE VEIN SURGERY  1984   pt denies    Allergies  Allergies  Allergen Reactions  Dog Epithelium (Canis Lupus Familiaris) Itching, Anxiety, Palpitations, Other (See Comments), Cough and Shortness Of Breath    Other reaction(s): Respiratory Distress (ALLERGY/intolerance), Wheezing (ALLERGY/intolerance)   Mushroom Extract Complex Nausea Only and Other (See Comments)    Severe vertigo and headaches    EKGs/Labs/Other Studies Reviewed:   The following studies  were reviewed today: CTA 09/11/22  IMPRESSION: 1. Unchanged aneurysmal dilation of the aortic root and ascending aorta, measuring up to 4.7 cm at the sinuses of Valsalva and 4.6 cm at the ascending aorta. Ascending thoracic aortic aneurysm. Recommend semi-annual imaging followup by CTA or MRA and referral to cardiothoracic surgery if not already obtained. This recommendation follows 2010 ACCF/AHA/AATS/ACR/ASA/SCA/SCAI/SIR/STS/SVM Guidelines for the Diagnosis and Management of Patients With Thoracic Aortic Disease. Circulation. 2010; 121: Z610-R604. Aortic aneurysm NOS (ICD10-I71.9) 2. Diffuse subpleural reticulations and apical predominant paraseptal emphysema, suspicious for underlying interstitial lung disease. Recommend correlation with high-resolution ILD protocol chest CT if one has not been performed already. 3. Subcentimeter focus of arterial enhancement in the right posterior hepatic lobe, likely a focus of perfusional variation. Attention on follow-up. 4. Aortic Atherosclerosis (ICD10-I70.0) and Emphysema (ICD10-J43.9). Coronary artery calcifications. Assessment for potential risk factor modification, dietary therapy or pharmacologic therapy may be warranted, if clinically indicated.  EKG:  EKG is not ordered today.    Recent Labs: 03/12/2022: BUN 20; Creatinine, Ser 1.72; Hemoglobin 13.0; Platelets 229; Potassium 3.9; Sodium 139 03/13/2022: B Natriuretic Peptide 276.4  Recent Lipid Panel    Component Value Date/Time   CHOL 86 (L) 08/28/2021 0848   TRIG 83 08/28/2021 0848   HDL 46 08/28/2021 0848   CHOLHDL 1.9 08/28/2021 0848   CHOLHDL 2.8 08/22/2018 0645   VLDL 12 08/22/2018 0645   LDLCALC 23 08/28/2021 0848     Home Medications   Current Meds  Medication Sig   ALPRAZolam (ALPRAZOLAM XR) 1 MG 24 hr tablet TAKE 1 TABLET BY MOUTH DAILY   amLODipine (NORVASC) 5 MG tablet Take 1 tablet (5 mg total) by mouth daily.   apixaban (ELIQUIS) 5 MG TABS tablet Take 1 tablet  (5 mg total) by mouth 2 (two) times daily.   Ascorbic Acid (VITAMIN C) 1000 MG tablet Take 1,000 mg by mouth 2 (two) times daily.   aspirin-acetaminophen-caffeine (EXCEDRIN MIGRAINE) 250-250-65 MG tablet Take by mouth as needed for headache.   buPROPion (WELLBUTRIN XL) 300 MG 24 hr tablet Take 300 mg by mouth daily.   butalbital-aspirin-caffeine-codeine (FIORINAL WITH CODEINE) 50-325-40-30 MG capsule TAKE 1 CAPSULE BY MOUTH EVERY 4 HOURS AS NEEDED FOR PAIN   diazepam (VALIUM) 5 MG tablet Take 2.5-5 mg by mouth 2 (two) times daily as needed for anxiety.    furosemide (LASIX) 20 MG tablet TAKE ONE TABLET BY MOUTH DAILY   ibuprofen (ADVIL,MOTRIN) 200 MG tablet Take 200-400 mg by mouth every 8 (eight) hours as needed (for arthritis in the feet).   losartan (COZAAR) 25 MG tablet TAKE ONE TABLET BY MOUTH DAILY   MANCHURIAN GINSENG PO Take by mouth daily at 6 (six) AM.   Nebivolol HCl (BYSTOLIC) 20 MG TABS Take 1 tablet (20 mg total) by mouth daily.   niacin 500 MG tablet Take 500 mg by mouth at bedtime.   rosuvastatin (CRESTOR) 20 MG tablet TAKE ONE TABLET BY MOUTH DAILY   Specialty Vitamins Products (CENTRUM SPECIALIST ENERGY) TABS Take 1 tablet by mouth daily with breakfast.   tadalafil (CIALIS) 5 MG tablet Take 5 mg by mouth every evening.   Testosterone 40.5 MG/2.5GM (1.62%) GEL  Place 4 Pump onto the skin daily. Apply 4 pumps to each shoulder every morning   traZODone (DESYREL) 50 MG tablet Take 1.5 tablets (75 mg total) by mouth at bedtime.   Turmeric 500 MG CAPS Take 500 mg by mouth daily.   vitamin E 400 UNIT capsule Take 400 Units by mouth at bedtime.      Review of Systems      All other systems reviewed and are otherwise negative except as noted above.  Physical Exam    VS:  BP (!) 140/60   Pulse (!) 58   Ht 5\' 7"  (1.702 m)   Wt 141 lb (64 kg)   SpO2 99%   BMI 22.08 kg/m  , BMI Body mass index is 22.08 kg/m.  Wt Readings from Last 3 Encounters:  09/26/22 141 lb (64 kg)   09/25/22 141 lb (64 kg)  09/16/22 143 lb (64.9 kg)     GEN: Well nourished, well developed, in no acute distress. HEENT: normal. Neck: Supple, no JVD, carotid bruits, or masses. Cardiac: RRR, no murmurs, rubs, or gallops. No clubbing, cyanosis, edema.  Radials/PT 2+ and equal bilaterally.  Respiratory:  Respirations regular and unlabored, clear to auscultation bilaterally. GI: Soft, nontender, nondistended. MS: No deformity or atrophy. Skin: Warm and dry, no rash. Neuro:  Strength and sensation are intact. Psych: Normal affect.  Assessment & Plan    Typical atrial flutter with CHA2DS2-VASc score of 3 -He is tolerating Eliquis 5 mg twice a day -Continue nebivolol 20 mg daily  Persistent atrial fibrillation -NSR today -continue current medications  CAD -No chest pain or shortness of breath today -Continue current medication including amlodipine 5 mg daily, Eliquis 5 mg twice a day, Lasix 20 mg daily, Lopressor 25 mg daily, Crestor 20 mg daily  Hypertension -Blood pressure well-controlled on current medication regimen -Continue low-sodium diet -Continue monitoring blood pressure at home      Disposition: Follow up 1 year with Armanda Magic, MD or APP.  Signed, Sharlene Dory, PA-C 09/26/2022, 6:54 PM [facility]

## 2022-09-25 NOTE — Progress Notes (Signed)
HPI:  The patient is a 77 year old active gentleman with a history of hypertension, hyperlipidemia, stage III chronic kidney disease, coronary artery disease status post CABG in 1998 in [place], and known bicuspid aortic valve with a dilated aortic root that has been followed by Dr. Clementeen Graham at [facility].  He was admitted to [facility] in 2020 with NSTEMI and cardiac catheterization in 08/2018 showed an occluded left radial artery graft to the RCA with a patent LIMA to the LAD with left-to-right collaterals.  He was continued on medical therapy and has been followed by Dr. Radford Pax for his cardiology care.  CTA of the chest and 05/2019 had shown a 4.7 cm aortic root.  He underwent ablation for atrial flutter in March 2021 and has been followed by Dr. Curt Bears.  He underwent repeat ablation on 08/02/2021.  I first saw him in March 2023 because he decided that he would like to establish all of his medical care in [place].  CTA of the chest at that time showed a 4.8-5 cm aortic root and 4.7 cm fusiform ascending aortic aneurysm that has been stable dating back to at least 2020.  He had an echocardiogram showing an indeterminate number of valve cusps with mild aortic insufficiency and normal LV dimensions and systolic function.  The aortic root on echo was noted to be 4.6 cm at the sinus level with an ascending aorta of 4.7 cm.  He continues to feel well and remains active without chest pain or shortness of breath.  Current Outpatient Medications  Medication Sig Dispense Refill   ALPRAZolam (ALPRAZOLAM XR) 1 MG 24 hr tablet TAKE 1 TABLET BY MOUTH DAILY 90 tablet 1   amLODipine (NORVASC) 5 MG tablet Take 1 tablet (5 mg total) by mouth daily. 90 tablet 2   apixaban (ELIQUIS) 5 MG TABS tablet Take 1 tablet (5 mg total) by mouth 2 (two) times daily. 60 tablet 11   Ascorbic Acid (VITAMIN C) 1000 MG tablet Take 1,000 mg by mouth 2 (two) times daily.     buPROPion (WELLBUTRIN XL) 300 MG 24 hr tablet Take  300 mg by mouth daily.     butalbital-aspirin-caffeine-codeine (FIORINAL WITH CODEINE) 50-325-40-30 MG capsule TAKE 1 CAPSULE BY MOUTH EVERY 4 HOURS AS NEEDED FOR PAIN 30 capsule 5   diazepam (VALIUM) 5 MG tablet Take 2.5-5 mg by mouth 2 (two) times daily as needed for anxiety.      furosemide (LASIX) 20 MG tablet TAKE ONE TABLET BY MOUTH DAILY 90 tablet 1   ibuprofen (ADVIL,MOTRIN) 200 MG tablet Take 200-400 mg by mouth every 8 (eight) hours as needed (for arthritis in the feet).     losartan (COZAAR) 25 MG tablet TAKE ONE TABLET BY MOUTH DAILY 90 tablet 2   Nebivolol HCl (BYSTOLIC) 20 MG TABS Take 1 tablet (20 mg total) by mouth daily. 90 tablet 2   niacin 500 MG tablet Take 500 mg by mouth at bedtime.     rosuvastatin (CRESTOR) 20 MG tablet TAKE ONE TABLET BY MOUTH DAILY 90 tablet 3   Specialty Vitamins Products (CENTRUM SPECIALIST ENERGY) TABS Take 1 tablet by mouth daily with breakfast.     tadalafil (CIALIS) 5 MG tablet Take 5 mg by mouth every evening.     Testosterone 40.5 MG/2.5GM (1.62%) GEL Place 4 Pump onto the skin daily. Apply 4 pumps to each shoulder every morning     traZODone (DESYREL) 50 MG tablet Take 1.5 tablets (75 mg total) by mouth  at bedtime. 135 tablet 0   Turmeric 500 MG CAPS Take 500 mg by mouth daily.     vitamin E 400 UNIT capsule Take 400 Units by mouth at bedtime.      No current facility-administered medications for this visit.     Physical Exam: BP (!) 147/75 (BP Location: Right Arm, Patient Position: Sitting)   Pulse 61   Resp 18   Ht '5\' 7"'$  (1.702 m)   Wt 141 lb (64 kg)   SpO2 100% Comment: RA  BMI 22.08 kg/m  He looks well. Cardiac exam shows a regular rate and rhythm with normal heart sounds.  There is no murmur. Lungs are clear. There is no peripheral edema.  Diagnostic Tests:  Narrative & Impression  CLINICAL DATA:  Follow-up of thoracic aortic aneurysm   EXAM: CT ANGIOGRAPHY CHEST WITH CONTRAST   TECHNIQUE: Multidetector CT imaging of  the chest was performed using the standard protocol during bolus administration of intravenous contrast. Multiplanar CT image reconstructions and MIPs were obtained to evaluate the vascular anatomy.   RADIATION DOSE REDUCTION: This exam was performed according to the departmental dose-optimization program which includes automated exposure control, adjustment of the mA and/or kV according to patient size and/or use of iterative reconstruction technique.   CONTRAST:  20m ISOVUE-370 IOPAMIDOL (ISOVUE-370) INJECTION 76%   COMPARISON:  None Available.   Cardiac CT dated 07/25/2021   FINDINGS: Cardiovascular:   Aortic Root:   --Valve: 3.2 x 3.2 cm, unchanged   --Sinuses: 4.7 x 4.7 x 4.3 cm, previously 4.6 x 4.5 x 4.3   --Sinotubular Junction: 4.4 x 4.2 cm, unchanged   Limitations by motion: Mild   Thoracic Aorta:   --Ascending Aorta: 4.6 x 4.5 cm, previously 0.7 x 4.7 cm   --Aortic Arch: 3.6 x 3.3 cm   --Descending Aorta: 3.2 x 3.0 cm   Other:   Normal heart size. No pericardial effusion. Coronary artery calcifications.   Mediastinum/Nodes: Imaged thyroid gland without nodules meeting criteria for imaging follow-up by size. Patulous esophagus. No pathologically enlarged axillary, supraclavicular, mediastinal, or hilar lymph nodes.   Lungs/Pleura: The central airways are patent. Diffuse subpleural reticulations and apical predominant paraseptal emphysema. No focal consolidation. No pneumothorax. No pleural effusion.   Upper abdomen: Subcentimeter focus of arterial enhancement in the right posterior hepatic lobe (4:112), likely a focus of perfusional variation. Atrophic pancreas. 2.1 cm left renal upper pole hypodensity (4:103), likely simple cyst. No specific follow-up imaging recommended.   Musculoskeletal: Median sternotomy wires are nondisplaced. Multilevel degenerative changes of the thoracic spine.   Review of the MIP images confirms the above findings.    IMPRESSION: 1. Unchanged aneurysmal dilation of the aortic root and ascending aorta, measuring up to 4.7 cm at the sinuses of Valsalva and 4.6 cm at the ascending aorta. Ascending thoracic aortic aneurysm. Recommend semi-annual imaging followup by CTA or MRA and referral to cardiothoracic surgery if not already obtained. This recommendation follows 2010 ACCF/AHA/AATS/ACR/ASA/SCA/SCAI/SIR/STS/SVM Guidelines for the Diagnosis and Management of Patients With Thoracic Aortic Disease. Circulation. 2010; 121:[id number] Aortic aneurysm NOS (ICD10-I71.9) 2. Diffuse subpleural reticulations and apical predominant paraseptal emphysema, suspicious for underlying interstitial lung disease. Recommend correlation with high-resolution ILD protocol chest CT if one has not been performed already. 3. Subcentimeter focus of arterial enhancement in the right posterior hepatic lobe, likely a focus of perfusional variation. Attention on follow-up. 4. Aortic Atherosclerosis (ICD10-I70.0) and Emphysema (ICD10-J43.9). Coronary artery calcifications. Assessment for potential risk factor modification, dietary therapy or pharmacologic therapy may  be warranted, if clinically indicated.     Electronically Signed   By: Darrin Nipper M.D.   On: 09/11/2022 15:37      ECHOCARDIOGRAM REPORT       Patient Name:   [name] [facility] Date of Exam: 08/26/2022  Medical Rec #:  [id number]      Height:       67.0 in  Accession #:    [id number]     Weight:       146.0 lb  Date of Birth:  11/09/1945      BSA:          1.769 m  Patient Age:    56 years       BP:           143/76 mmHg  Patient Gender: M              HR:           55 bpm.  Exam Location:  [place]   Procedure: 2D Echo, 3D Echo, Cardiac Doppler and Color Doppler   Indications:    I35.0 Aortic Vavle disorder    History:        Patient has prior history of Echocardiogram examinations,  most                 recent 04/01/2022. CAD and NSTEMI, Prior CABG,  Carotid  Disease                 and CKD, Arrythmias:Atrial Flutter and PVC; Risk  Factors:Sleep                 Apnea and HLD.    Sonographer:    Marygrace Drought RCS  Referring Phys: [place]     1. Left ventricular ejection fraction, by estimation, is 60 to 65%. Left  ventricular ejection fraction by 3D volume is 62 %. The left ventricle has  normal function. The left ventricle has no regional wall motion  abnormalities. Left ventricular diastolic   parameters were normal.   2. Right ventricular systolic function is normal. The right ventricular  size is normal. There is normal pulmonary artery systolic pressure. The  estimated right ventricular systolic pressure is Q000111Q mmHg.   3. Left atrial size was mildly dilated.   4. The mitral valve is normal in structure. No evidence of mitral valve  regurgitation. No evidence of mitral stenosis.   5. The aortic valve is tricuspid. There is mild calcification of the  aortic valve. There is mild thickening of the aortic valve. Aortic valve  regurgitation is mild. No aortic stenosis is present. Aortic regurgitation  PHT measures 554 msec.   6. Aortic dilatation noted. Aneurysm of the aortic root, measuring 51 mm.  Aneurysm of the ascending aorta, measuring 47 mm.   7. The inferior vena cava is normal in size with greater than 50%  respiratory variability, suggesting right atrial pressure of 3 mmHg.   Comparison(s): No significant change from prior study. Prior images  reviewed side by side.   FINDINGS   Left Ventricle: Left ventricular ejection fraction, by estimation, is 60  to 65%. Left ventricular ejection fraction by 3D volume is 62 %. The left  ventricle has normal function. The left ventricle has no regional wall  motion abnormalities. The left  ventricular internal cavity size was normal in size. There is no left  ventricular hypertrophy. Left ventricular diastolic parameters were  normal.  Right  Ventricle: The right ventricular size is normal. No increase in  right ventricular wall thickness. Right ventricular systolic function is  normal. There is normal pulmonary artery systolic pressure. The tricuspid  regurgitant velocity is 2.78 m/s, and   with an assumed right atrial pressure of 3 mmHg, the estimated right  ventricular systolic pressure is Q000111Q mmHg.   Left Atrium: Left atrial size was mildly dilated.   Right Atrium: Right atrial size was normal in size.   Pericardium: There is no evidence of pericardial effusion.   Mitral Valve: The mitral valve is normal in structure. Mild mitral annular  calcification. No evidence of mitral valve regurgitation. No evidence of  mitral valve stenosis.   Tricuspid Valve: The tricuspid valve is normal in structure. Tricuspid  valve regurgitation is mild . No evidence of tricuspid stenosis.   Aortic Valve: The aortic valve is tricuspid. There is mild calcification  of the aortic valve. There is mild thickening of the aortic valve. Aortic  valve regurgitation is mild. Aortic regurgitation PHT measures 554 msec.  No aortic stenosis is present.   Pulmonic Valve: The pulmonic valve was normal in structure. Pulmonic valve  regurgitation is not visualized. No evidence of pulmonic stenosis.   Aorta: Aortic dilatation noted. There is an aneurysm involving the aortic  root measuring 51 mm. There is an aneurysm involving the ascending aorta  measuring 47 mm.   Venous: The inferior vena cava is normal in size with greater than 50%  respiratory variability, suggesting right atrial pressure of 3 mmHg.   IAS/Shunts: No atrial level shunt detected by color flow Doppler.     LEFT VENTRICLE  PLAX 2D  LVIDd:         4.40 cm         Diastology  LVIDs:         2.80 cm         LV e' medial:    11.90 cm/s  LV PW:         1.00 cm         LV E/e' medial:  8.1  LV IVS:        1.50 cm         LV e' lateral:   12.10 cm/s  LVOT diam:     2.10 cm          LV E/e' lateral: 8.0  LV SV:         91  LV SV Index:   51  LVOT Area:     3.46 cm        3D Volume EF                                 LV 3D EF:    Left                                              ventricul                                              ar  ejection                                              fraction                                              by 3D                                              volume is                                              62 %.                                   3D Volume EF:                                 3D EF:        62 %                                 LV EDV:       140 ml                                 LV ESV:       53 ml                                 LV SV:        87 ml   RIGHT VENTRICLE  RV Basal diam:  2.60 cm  RV S prime:     12.10 cm/s  TAPSE (M-mode): 2.7 cm  RVSP:           33.9 mmHg   LEFT ATRIUM             Index        RIGHT ATRIUM           Index  LA diam:        3.80 cm 2.15 cm/m   RA Pressure: 3.00 mmHg  LA Vol (A2C):   71.2 ml 40.25 ml/m  RA Area:     14.20 cm  LA Vol (A4C):   52.8 ml 29.85 ml/m  RA Volume:   32.40 ml  18.31 ml/m  LA Biplane Vol: 61.5 ml 34.76 ml/m   AORTIC VALVE  LVOT Vmax:   110.00 cm/s  LVOT Vmean:  70.200 cm/s  LVOT VTI:    0.263 m  AI PHT:      554 msec    AORTA  Ao Root diam: 5.10 cm  Ao Asc diam:  4.70 cm   MITRAL VALVE  TRICUSPID VALVE  MV Area (PHT):             TR Peak grad:   30.9 mmHg  MV Decel Time:             TR Vmax:        278.00 cm/s  MR Peak grad: 88.7 mmHg    Estimated RAP:  3.00 mmHg  MR Mean grad: 60.0 mmHg    RVSP:           33.9 mmHg  MR Vmax:      471.00 cm/s  MR Vmean:     369.0 cm/s   SHUNTS  MV E velocity: 96.40 cm/s  Systemic VTI:  0.26 m  MV A velocity: 32.50 cm/s  Systemic Diam: 2.10 cm  MV E/A ratio:  2.97   Candee Furbish MD  Electronically signed by Candee Furbish MD  Signature Date/Time:  08/26/2022/2:34:48 PM        Final     Impression:  This 77 year old gentleman has a stable aortic root and ascending aortic aneurysm with a sinus diameter of 4.7 cm, an ascending aortic diameter of 4.6 cm, and the sinotubular junction diameter of 4.4 cm.  The descending aorta measures 3.2 cm.  2D echocardiogram shows a trileaflet aortic valve with mild calcification and thickening with mild regurgitation but no stenosis.  Left ventricular function is normal.  The aortic root was measured at 5.1 cm with an ascending aortic diameter 4.7 cm.  His aorta is still below the surgical threshold of 5.5 cm and has been fairly stable.  I reviewed the CT and echo images with him and answered all of his questions.  I stressed the importance of continued good blood pressure control in preventing further enlargement and acute aortic dissection.  I also advised him against doing any heavy lifting that may require a Valsalva maneuver and could suddenly raise his blood pressure to high levels.  Plan:  I will see him back in 1 year with a CTA of the chest.  He will continue to follow-up with Dr. Radford Pax and should probably have an echocardiogram done yearly.  I spent 15 minutes performing this established patient evaluation and > 50% of this time was spent face to face counseling and coordinating the care of this patient's aortic aneurysm.    Gaye Pollack, MD [facility] [phone]

## 2022-09-26 ENCOUNTER — Encounter: Payer: Self-pay | Admitting: Physician Assistant

## 2022-09-26 ENCOUNTER — Ambulatory Visit: Payer: Medicare Other | Attending: Cardiology | Admitting: Physician Assistant

## 2022-09-26 VITALS — BP 140/60 | HR 58 | Ht 67.0 in | Wt 141.0 lb

## 2022-09-26 DIAGNOSIS — D6869 Other thrombophilia: Secondary | ICD-10-CM | POA: Diagnosis present

## 2022-09-26 DIAGNOSIS — I2510 Atherosclerotic heart disease of native coronary artery without angina pectoris: Secondary | ICD-10-CM | POA: Insufficient documentation

## 2022-09-26 DIAGNOSIS — I4819 Other persistent atrial fibrillation: Secondary | ICD-10-CM | POA: Diagnosis present

## 2022-09-26 DIAGNOSIS — I483 Typical atrial flutter: Secondary | ICD-10-CM

## 2022-09-26 DIAGNOSIS — I10 Essential (primary) hypertension: Secondary | ICD-10-CM | POA: Diagnosis present

## 2022-09-26 NOTE — Patient Instructions (Signed)
Medication Instructions:   Your physician recommends that you continue on your current medications as directed. Please refer to the Current Medication list given to you today.    *If you need a refill on your cardiac medications before your next appointment, please call your pharmacy*   Lab Work: LFT AND LIPIDS TODAY    If you have labs (blood work) drawn today and your tests are completely normal, you will receive your results only by: [place] (if you have MyChart) OR A paper copy in the mail If you have any lab test that is abnormal or we need to change your treatment, we will call you to review the results.   Testing/Procedures: NONE ORDERED  TODAY     Follow-Up: At [facility], you and your health needs are our priority.  As part of our continuing mission to provide you with exceptional heart care, we have created designated Provider Care Teams.  These Care Teams include your primary Cardiologist (physician) and Advanced Practice Providers (APPs -  Physician Assistants and Nurse Practitioners) who all work together to provide you with the care you need, when you need it.  We recommend signing up for the patient portal called "MyChart".  Sign up information is provided on this After Visit Summary.  MyChart is used to connect with patients for Virtual Visits (Telemedicine).  Patients are able to view lab/test results, encounter notes, upcoming appointments, etc.  Non-urgent messages can be sent to your provider as well.   To learn more about what you can do with MyChart, go to [email or web address].    Your next appointment:   6 month(s)  Provider:   Fransico Him, MD     Other Instructions

## 2022-09-30 ENCOUNTER — Ambulatory Visit: Payer: Medicare Other | Attending: Physician Assistant

## 2022-09-30 DIAGNOSIS — I2510 Atherosclerotic heart disease of native coronary artery without angina pectoris: Secondary | ICD-10-CM

## 2022-09-30 LAB — LIPID PANEL
Chol/HDL Ratio: 2.7 ratio (ref 0.0–5.0)
Cholesterol, Total: 95 mg/dL — ABNORMAL LOW (ref 100–199)
HDL: 35 mg/dL — ABNORMAL LOW (ref >39)
LDL Chol Calc (NIH): 38 mg/dL (ref 0–99)
Triglycerides: 124 mg/dL (ref 0–149)
VLDL Cholesterol Cal: 22 mg/dL (ref 5–40)

## 2022-09-30 LAB — HEPATIC FUNCTION PANEL
ALT: 18 IU/L (ref 0–44)
AST: 32 IU/L (ref 0–40)
Albumin: 4.2 g/dL (ref 3.8–4.8)
Alkaline Phosphatase: 113 IU/L (ref 44–121)
Bilirubin Total: 0.3 mg/dL (ref 0.0–1.2)
Bilirubin, Direct: 0.13 mg/dL (ref 0.00–0.40)
Total Protein: 6.1 g/dL (ref 6.0–8.5)

## 2022-10-03 ENCOUNTER — Other Ambulatory Visit: Payer: Medicare Other

## 2022-10-07 ENCOUNTER — Other Ambulatory Visit: Payer: Self-pay | Admitting: *Deleted

## 2022-10-07 MED ORDER — ALPRAZOLAM ER 1 MG PO TB24: 1.0000 mg | ORAL_TABLET | Freq: Every day | ORAL | 1 refills | Status: DC

## 2022-10-07 NOTE — Telephone Encounter (Signed)
Last seen on 05/29/2022 Follow up scheduled on 11/27/22 Rx last filled on 04/02/2022 #90 tablets Rx pending to be signed.

## 2022-10-10 ENCOUNTER — Emergency Department (HOSPITAL_COMMUNITY): Payer: Medicare Other

## 2022-10-10 ENCOUNTER — Emergency Department (HOSPITAL_COMMUNITY)
Admission: EM | Admit: 2022-10-10 | Discharge: 2022-10-10 | Disposition: A | Discharge status: Home / Self Care | Payer: Medicare Other | Attending: Emergency Medicine | Admitting: Emergency Medicine

## 2022-10-10 ENCOUNTER — Other Ambulatory Visit: Payer: Self-pay

## 2022-10-10 DIAGNOSIS — I480 Paroxysmal atrial fibrillation: Secondary | ICD-10-CM | POA: Insufficient documentation

## 2022-10-10 DIAGNOSIS — N183 Chronic kidney disease, stage 3 unspecified: Secondary | ICD-10-CM | POA: Insufficient documentation

## 2022-10-10 DIAGNOSIS — I129 Hypertensive chronic kidney disease with stage 1 through stage 4 chronic kidney disease, or unspecified chronic kidney disease: Secondary | ICD-10-CM | POA: Diagnosis not present

## 2022-10-10 DIAGNOSIS — Z951 Presence of aortocoronary bypass graft: Secondary | ICD-10-CM | POA: Insufficient documentation

## 2022-10-10 DIAGNOSIS — I2510 Atherosclerotic heart disease of native coronary artery without angina pectoris: Secondary | ICD-10-CM | POA: Diagnosis not present

## 2022-10-10 DIAGNOSIS — R059 Cough, unspecified: Secondary | ICD-10-CM | POA: Diagnosis present

## 2022-10-10 DIAGNOSIS — Z7901 Long term (current) use of anticoagulants: Secondary | ICD-10-CM | POA: Insufficient documentation

## 2022-10-10 LAB — CBC
HCT: 37.8 % — ABNORMAL LOW (ref 39.0–52.0)
Hemoglobin: 12.6 g/dL — ABNORMAL LOW (ref 13.0–17.0)
MCH: 35.2 pg — ABNORMAL HIGH (ref 26.0–34.0)
MCHC: 33.3 g/dL (ref 30.0–36.0)
MCV: 105.6 fL — ABNORMAL HIGH (ref 80.0–100.0)
Platelets: 180 10*3/uL (ref 150–400)
RBC: 3.58 MIL/uL — ABNORMAL LOW (ref 4.22–5.81)
RDW: 11.9 % (ref 11.5–15.5)
WBC: 8.3 10*3/uL (ref 4.0–10.5)
nRBC: 0 % (ref 0.0–0.2)

## 2022-10-10 LAB — BASIC METABOLIC PANEL
Anion gap: 8 (ref 5–15)
BUN: 24 mg/dL — ABNORMAL HIGH (ref 8–23)
CO2: 26 mmol/L (ref 22–32)
Calcium: 8.3 mg/dL — ABNORMAL LOW (ref 8.9–10.3)
Chloride: 104 mmol/L (ref 98–111)
Creatinine, Ser: 1.67 mg/dL — ABNORMAL HIGH (ref 0.61–1.24)
GFR, Estimated: 43 mL/min — ABNORMAL LOW (ref >60)
Glucose, Bld: 143 mg/dL — ABNORMAL HIGH (ref 70–99)
Potassium: 3.6 mmol/L (ref 3.5–5.1)
Sodium: 138 mmol/L (ref 135–145)

## 2022-10-10 LAB — MAGNESIUM: Magnesium: 2.2 mg/dL (ref 1.7–2.4)

## 2022-10-10 LAB — TROPONIN I (HIGH SENSITIVITY)
Troponin I (High Sensitivity): 13 ng/L (ref <18)
Troponin I (High Sensitivity): 13 ng/L (ref <18)

## 2022-10-10 LAB — PROTIME-INR
INR: 1.3 — ABNORMAL HIGH (ref 0.8–1.2)
Prothrombin Time: 16 seconds — ABNORMAL HIGH (ref 11.4–15.2)

## 2022-10-10 MED ORDER — SODIUM CHLORIDE 0.9 % IV BOLUS
1000.0000 mL | Freq: Once | INTRAVENOUS | Status: AC
  Administered 2022-10-10: 1000 mL via INTRAVENOUS

## 2022-10-10 NOTE — ED Triage Notes (Signed)
Pt c/o persistent chest pain and palpations that started yesterday. States persistent centralized chest pain that radiates to the back, described as a dull/ache, and associated with SOB. Hx A fib and A Flutter. Has been complaint with eliquis.

## 2022-10-10 NOTE — ED Provider Notes (Signed)
Here with atypical chest pain.  Plan for follow-up troponin, DC if unremarkable.  Repeat troponin is stable.  No concern for ACS at this time.  Patient discharged in good condition.  Recommend follow-up with primary care doctor.  This chart was dictated using voice recognition software.  Despite best efforts to proofread,  errors can occur which can change the documentation meaning.    Lennice Sites, DO 10/10/22 [phone]

## 2022-10-10 NOTE — ED Provider Notes (Signed)
[facility] Provider Note  CSN: [id number] Arrival date & time: 10/10/22 [id number]  Chief Complaint(s) Chest Pain  HPI CARBON TREADWAY is a 77 y.o. male with extensive past medical history listed below including A-fib on Eliquis who presents to the emergency department with 2 days of right-sided chest discomfort and intermittent palpitations feel like he is in A-fib.  Chest pain is a dull ache on the right lateral chest.  Worse with palpation.  It is not exertional.  He denies any falls or trauma.  No coughing or congestion.  He endorses mild shortness of breath.  No nausea or vomiting.  No fevers or chills.  Patient reports that he recently finished a course of antibiotics for pretreatment for dental procedure.  States that he is compliant with all his medications including his anticoagulation.  The history is provided by the patient.    Past Medical History Past Medical History:  Diagnosis Date   Anal fissure    Aortic insufficiency    Arthritis    Atrial flutter (HCC)    Bicuspid aortic valve    with mild to moderate AR by echo 08/2019   BPH (benign prostatic hyperplasia)    CAD (coronary artery disease)    s/p CABG [place]   Cataract    bilateral-removed   CKD (chronic kidney disease), stage III ([place])    Depression    Dilated aortic root (HCC)    History of blood transfusion 1998   "due to cardiac bypass surgery"   Hyperlipidemia    Hypertension    Hypogonadism in male    Kidney stones    Migraine    Mild carotid artery disease ([place]) 2009   Myocardial infarction ([place])    PVC's (premature ventricular contractions)    Retinal hemorrhage 11/04/2014   Sleep apnea with use of continuous positive airway pressure (CPAP)    Thoracic aortic aneurysm (TAA) ([place])    25mm aoartic root and 27mm ascending aorta by echo 08/2022   Patient Active Problem List   Diagnosis Date Noted   Dependence on CPAP ventilation 11/20/2021   CSA  (central sleep apnea) 11/20/2021   Sleep-related headache 11/20/2021   Medication management [id number]   Acute diastolic congestive heart failure ([place]) 07/19/2021   Essential hypertension 07/19/2021   Coronary artery disease involving native coronary artery of native heart without angina pectoris 07/19/2021   Persistent atrial fibrillation ([place]) 05/09/2021   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 05/07/2021   Arthritis of carpometacarpal (CMC) joints of both thumbs 05/07/2021   Persistent migraine aura without cerebral infarction and without status migrainosus, not intractable 11/15/2020   Type 2 macular telangiectasis, bilateral 03/06/2020   Intermediate stage nonexudative age-related macular degeneration of both eyes 03/06/2020   Pseudophakia 03/06/2020   Typical atrial flutter ([place]) 07/29/2019   Secondary hypercoagulable state ([place]) 07/06/2019   NSTEMI (non-ST elevated myocardial infarction) ([place]) 08/21/2018   Migraine with aura and without status migrainosus, not intractable 10/14/2017   Generalized anxiety disorder 10/10/2016   OSA on CPAP 10/10/2016   Bicuspid aortic valve    Complex sleep apnea syndrome 06/05/2016   Aortic insufficiency 01/19/2015   Thoracic aortic aneurysm (TAA) ([place]) 01/19/2015   Retinal hemorrhage 11/04/2014   Insomnia 11/04/2014   HTN (hypertension) 09/21/2014   Hyperlipidemia 09/21/2014   CAD (coronary artery disease) 09/21/2014   History of migraine 09/21/2014   Hypogonadism in male 09/21/2014   BPH (benign prostatic hyperplasia) 09/21/2014  Anxiety and depression 09/21/2014   Home Medication(s) Prior to Admission medications   Medication Sig Start Date End Date Taking? Authorizing Provider  ALPRAZolam (ALPRAZOLAM XR) 1 MG 24 hr tablet Take 1 tablet (1 mg total) by mouth daily. 10/07/22   Dohmeier, Asencion Partridge, MD  amLODipine (NORVASC) 5 MG tablet Take 1 tablet (5 mg total) by mouth daily. 08/20/22   Swinyer, Lanice Schwab, NP  apixaban (ELIQUIS) 5 MG TABS tablet Take 1 tablet (5 mg total) by mouth 2 (two) times daily. 04/27/21   Sherren Mocha, MD  Ascorbic Acid (VITAMIN C) 1000 MG tablet Take 1,000 mg by mouth 2 (two) times daily.    [provider]  aspirin-acetaminophen-caffeine (EXCEDRIN MIGRAINE) [phone] MG tablet Take by mouth as needed for headache. 09/04/18   [provider]  buPROPion (WELLBUTRIN XL) 300 MG 24 hr tablet Take 300 mg by mouth daily.    [provider]  butalbital-aspirin-caffeine-codeine (FIORINAL WITH CODEINE) 50-325-40-30 MG capsule TAKE 1 CAPSULE BY MOUTH EVERY 4 HOURS AS NEEDED FOR PAIN 09/24/22   Dohmeier, Asencion Partridge, MD  diazepam (VALIUM) 5 MG tablet Take 2.5-5 mg by mouth 2 (two) times daily as needed for anxiety.  10/05/14   [provider]  furosemide (LASIX) 20 MG tablet TAKE ONE TABLET BY MOUTH DAILY 08/21/22   Sueanne Margarita, MD  ibuprofen (ADVIL,MOTRIN) 200 MG tablet Take 200-400 mg by mouth every 8 (eight) hours as needed (for arthritis in the feet).    [provider]  losartan (COZAAR) 25 MG tablet TAKE ONE TABLET BY MOUTH DAILY 02/08/22   Sueanne Margarita, MD  MANCHURIAN GINSENG PO Take by mouth daily at 6 (six) AM. 09/04/18   [provider]  Nebivolol HCl (BYSTOLIC) 20 MG TABS Take 1 tablet (20 mg total) by mouth daily. 08/30/22   Sueanne Margarita, MD  niacin 500 MG tablet Take 500 mg by mouth at bedtime.    [provider]  rosuvastatin (CRESTOR) 20 MG tablet TAKE ONE TABLET BY MOUTH DAILY 12/28/21   Sueanne Margarita, MD  Specialty Vitamins Products (CENTRUM SPECIALIST ENERGY) TABS Take 1 tablet by mouth daily with breakfast.    [provider]  tadalafil (CIALIS) 5 MG tablet Take 5 mg by mouth every evening.    [provider]  Testosterone 40.5 MG/2.5GM (1.62%) GEL Place 4 Pump onto the skin daily. Apply 4 pumps to each shoulder every morning    [provider]  traZODone (DESYREL) 50 MG tablet  Take 1.5 tablets (75 mg total) by mouth at bedtime. 12/26/21   Sater, Nanine Means, MD  Turmeric 500 MG CAPS Take 500 mg by mouth daily.    [provider]  vitamin E 400 UNIT capsule Take 400 Units by mouth at bedtime.     [provider]  Allergies Dog epithelium (canis lupus familiaris) and Mushroom extract complex  Review of Systems Review of Systems As noted in HPI  Physical Exam Vital Signs  I have reviewed the triage vital signs BP (!) 107/93   Pulse 80   Temp 98.1 F (36.7 C) (Oral)   Resp (!) 9   Ht 5\' 7"  (1.702 m)   Wt 64.4 kg   SpO2 99%   BMI 22.24 kg/m   Physical Exam Vitals reviewed.  Constitutional:      General: He is not in acute distress.    Appearance: He is well-developed. He is not diaphoretic.  HENT:     Head: Normocephalic and atraumatic.     Nose: Nose normal.  Eyes:     General: No scleral icterus.       Right eye: No discharge.        Left eye: No discharge.     Conjunctiva/sclera: Conjunctivae normal.     Pupils: Pupils are equal, round, and reactive to light.  Cardiovascular:     Rate and Rhythm: Tachycardia present. Rhythm irregularly irregular.     Heart sounds: No murmur heard.    No friction rub. No gallop.  Pulmonary:     Effort: Pulmonary effort is normal. No respiratory distress.     Breath sounds: Normal breath sounds. No stridor. No rales.  Chest:     Chest wall: Tenderness present.    Abdominal:     General: There is no distension.     Palpations: Abdomen is soft.     Tenderness: There is no abdominal tenderness.  Musculoskeletal:        General: No tenderness.     Cervical back: Normal range of motion and neck supple.     Right lower leg: No edema.     Left lower leg: No edema.  Skin:    General: Skin is warm and dry.     Findings: No erythema or rash.  Neurological:      Mental Status: He is alert and oriented to person, place, and time.     ED Results and Treatments Labs (all labs ordered are listed, but only abnormal results are displayed) Labs Reviewed  BASIC METABOLIC PANEL - Abnormal; Notable for the following components:      Result Value   Glucose, Bld 143 (*)    BUN 24 (*)    Creatinine, Ser 1.67 (*)    Calcium 8.3 (*)    GFR, Estimated 43 (*)    All other components within normal limits  CBC - Abnormal; Notable for the following components:   RBC 3.58 (*)    Hemoglobin 12.6 (*)    HCT 37.8 (*)    MCV 105.6 (*)    MCH 35.2 (*)    All other components within normal limits  PROTIME-INR - Abnormal; Notable for the following components:   Prothrombin Time 16.0 (*)    INR 1.3 (*)    All other components within normal limits  MAGNESIUM  TROPONIN I (HIGH SENSITIVITY)  TROPONIN I (HIGH SENSITIVITY)  EKG  EKG Interpretation  Date/Time:  Thursday October 10 2022 04:27:56 EDT Ventricular Rate:  104 PR Interval:    QRS Duration: 85 QT Interval:  349 QTC Calculation: 410 R Axis:   89 Text Interpretation: Atrial fibrillation Paired ventricular premature complexes Borderline right axis deviation Probable anteroseptal infarct, old Confirmed by Addison Lank [phone]) on 10/10/2022 4:32:12 AM       Radiology DG Chest 2 View  Result Date: 10/10/2022 CLINICAL DATA:  77 year old male with history of mid chest pain. EXAM: CHEST - 2 VIEW COMPARISON:  Chest x-ray 03/12/2022. FINDINGS: Lung volumes are normal. No consolidative airspace disease. No pleural effusions. Cephalization of the pulmonary vasculature, without frank pulmonary edema. Heart size is mildly enlarged. Upper mediastinal contours are within normal limits. Atherosclerotic calcifications in the thoracic aorta. Status post median sternotomy for CABG. IMPRESSION: 1.  Cardiomegaly with pulmonary venous congestion, but no frank pulmonary edema. Electronically Signed   By: Vinnie Langton M.D.   On: 10/10/2022 05:49    Medications Ordered in ED Medications  sodium chloride 0.9 % bolus 1,000 mL (1,000 mLs Intravenous New Bag/Given 10/10/22 0456)                                                                                                                                     Procedures .1-3 Lead EKG Interpretation  Performed by: Fatima Blank, MD Authorized by: Fatima Blank, MD     Interpretation: abnormal     ECG rate:  104-132   ECG rate assessment: tachycardic     Rhythm: atrial fibrillation     Ectopy: none     Conduction: normal   .1-3 Lead EKG Interpretation  Performed by: Fatima Blank, MD Authorized by: Fatima Blank, MD     Interpretation: abnormal     ECG rate:  84   ECG rate assessment: normal     Rhythm: sinus rhythm     Ectopy: bigeminy     Conduction: normal   .1-3 Lead EKG Interpretation  Performed by: Fatima Blank, MD Authorized by: Fatima Blank, MD     Interpretation: normal     ECG rate:  80   ECG rate assessment: normal     Rhythm: sinus rhythm     Ectopy: none     Conduction: normal     (including critical care time)  Medical Decision Making / ED Course  Click here for ABCD2, HEART and other calculators  Medical Decision Making Amount and/or Complexity of Data Reviewed Labs: ordered. Radiology: ordered.    This patient presents to the ED for concern of chest pain, this involves an extensive number of treatment options, and is a complaint that carries with it a high risk of complications and morbidity. The differential diagnosis includes but not limited to atypical for ACS but given comorbidities and past medical history, will rule it out.  Given a 2-day duration,  feel that delta troponin should be sufficient to rule it out.  Discomfort may also be related  to patient being in A-fib with intermittent RVR.  Will assess for any evidence of pneumonia, pneumothorax, pulmonary edema or pleural effusions.  Patient does not appear to be volume overloaded concerning for heart failure exacerbation.  Will also assess for any electrolyte or metabolic derangements.  Initial EKG with A-fib.  Patient was given IVF. Converted to normal sinus rhythm shortly thereafter.  Patient monitored on telemetry and initially had bigeminy which resolved.  He remained in normal sinus rhythm without any other dysrhythmias or blocks.  Workup was reassuring without leukocytosis or anemia.  No significant electrolyte derangements.  Baseline renal function.  Initial troponin negative Delta troponin pending  Chest x-ray without evidence of pneumonia, pneumothorax, pulmonary edema or pleural effusion.  Patient does have mild cardiomegaly with pulmonary vascular congestion.    Patient care turned over to oncoming provider. Patient case and results discussed in detail; please see their note for further ED managment.      Final Clinical Impression(s) / ED Diagnoses Final diagnoses:  Paroxysmal atrial fibrillation ([place])           This chart was dictated using voice recognition software.  Despite best efforts to proofread,  errors can occur which can change the documentation meaning.    Fatima Blank, MD 10/11/22 [phone]

## 2022-11-03 NOTE — Progress Notes (Unsigned)
Cardiology Clinic Note   Date: 11/05/2022 ID: FILIBERTO WAMBLE, DOB 12/13/1945, MRN [id number]  Primary Cardiologist:  Armanda Magic, MD  Patient Profile    [name] is a 77 y.o. male who presents to the clinic today for hospital follow-up.  Past medical history significant for: CAD. S/p CABG x 2 1998: LIMA to LAD, vein graft to RCA. LHC 08/24/2018 (NSTEMI): Ostial LAD 100%.  Ostial to proximal RCA 100%.  Origin lesion 100%.  Proximal Cx 50%.  Patent LIMA to LAD.  Occluded left radial graft to RCA. Chronic diastolic heart failure. Echo 08/26/2022: EF 60 to 65%.  Mild LAE.  Mild AI.  Mild calcification/thickening of tricuspid aortic valve without stenosis.  Dilatation of aortic root 51 mm.  Aneurysm of ascending aorta 47 mm. PAF/a-flutter. Onset 05/11/2019. Successful DCCV performed at [facility] ED. DCCV 08/10/2019: Successful. DCCV x 2 05/21/2021: Successful. A- flutter ablation 09/29/2019. DCCV 07/12/2021: Successful. A-fib ablation 08/02/2021. SVT. 7-day ZIO 04/02/2022: Predominant underlying rhythm sinus.  33 SVT episodes, longest 13.5 seconds (31.4% ventricular ectopy burden).  No A-fib noted.  No triggered or symptomatic episodes. Aortic dilatation/aneurysm. CTA chest aorta 09/11/2022: Unchanged aneurysmal dilatation of aortic root and ascending aorta measuring up to 4.7 cm at sinuses of Valsalva at 4.6 cm at ascending aorta.  Descending thoracic aortic aneurysm.  Recommend semiannual imaging.  Followed by Dr. Lavinia Sharps in CT surgery. Hypertension. Hyperlipidemia. Lipid panel 09/30/2022: LDL 38, HDL 35, TG 124, total 95. OSA. CKD stage IV.   History of Present Illness    [name] patient was first evaluated by Dr. Mayford Knife on 12/22/2014 to establish care.  Patient referred to EP after being found in a-flutter at [facility] ED October 2020.  He is followed by Dr. Mayford Knife and Dr. Elberta Fortis for the above outlined history.  Patient was last seen in the office by  Dr. Elberta Fortis on 09/16/2022.  At that time he was doing well with no further episodes of A-fib/a-flutter and no changes were made.  Patient was last seen in the office by Jari Favre, PA-C on 09/26/2022 visit only complaint was depression after breaking up with his girlfriend of 5 years for which he was being followed by a psychologist.  He was stable from a cardiac standpoint and no medication changes were made.  Recently patient presented to the ER on 10/10/2022 with complaints of chest pain and palpitations.  Chest pain described as dull ache in central chest with radiation to the back and associated shortness of breath.  Initial EKG showed A-fib rate 104 bpm.  He was tender to palpation.  Troponin negative x 2. Patient was given IV fluids and converted to NSR.  Patient was discharged.  Today, patient reports no further episodes of A-fib since ER visit.  He reports today he presented to the ER he had been outside doing a lot of yard work.  He did not drink a lot of fluid while he was doing the work and believes he had become dehydrated.  His chest pain started in the center of his chest and radiated to the right and around to his back.  It remained sore for a couple of days after his ER visit and resolved on his own.  He feels it was likely a pulled muscle secondary to the yard work he was doing.   He noticed some mild shortness of breath with activity when working outside that resolves when he is indoors.  If he is active indoors  he does not have any dyspnea.  He wonders if this could be related to the change of season and the amount of pollen.  No chest pain, pressure, or tightness since ER visit. Denies lower extremity edema, orthopnea, or PND.  [facility] not [facility] for about 6 months "for no good reason."  He does report some hesitation and fear in doing his normal activities secondary to being worried about going into A-fib.  We discussed the possibility of him  obtaining an Apple smart watch so he can continually monitor his heart and be reassured if an arrhythmia is detected he will be alerted.  He has a cardia mobile but typically does not use it "it makes me nervous that it is going to tell me something bad."  [facility].  He reports he is going through some extensive dental work on his upper mouth past 6 months that will not be completed until around November.  He also reports continued depression for which he sees a Warden/ranger and takes Wellbutrin.  He states he feels it is important for all of his physicians to understand how sad and isolated he is feeling.  He reports there are some days that it is well into the evening before he talks to anyone.  He is encouraged to continue working with the psychologist and [facility].    ROS: All other systems reviewed and are otherwise negative except as noted in History of Present Illness.  Studies Reviewed    ECG personally reviewed by me today: Sinus rhythm with PVCs and bigeminy, 71 bpm.  No significant changes from 03/13/2022.  Risk Assessment/Calculations     CHA2DS2-VASc Score = 4   This indicates a 4.8% annual risk of stroke. The patient's score is based upon: CHF History: 1 HTN History: 1 Diabetes History: 0 Stroke History: 0 Vascular Disease History: 1 Age Score: 1 Gender Score: 0             Physical Exam    VS:  BP (!) 112/58   Pulse 71   Ht 5\' 7"  (1.702 m)   Wt 138 lb 12.8 oz (63 kg)   SpO2 99%   BMI 21.74 kg/m  , BMI Body mass index is 21.74 kg/m.  GEN: Well nourished, well developed, in no acute distress. Neck: No JVD or carotid bruits. Cardiac: RRR.  Occasional extrasystole.  No murmurs. No rubs or gallops.   Respiratory:  Respirations regular and unlabored. Clear to auscultation without rales, wheezing or rhonchi. GI: Soft, nontender, nondistended. Extremities: Radials/DP/PT 2+ and  equal bilaterally. No clubbing or cyanosis. No edema.  Skin: Warm and dry, no rash. Neuro: Strength intact.  Assessment & Plan   PAF/A-flutter.  History of multiple DCCV.  A-flutter ablation March 2021, A-fib ablation January 2023.  Most recently patient was seen in the ER on 10/10/2022 with complaints of atypical chest pain and palpitations.  He was found to be in A-fib which converted to NSR after IV fluids.  Patient denies further episodes of A-fib since the ER visit. No shortness of breath at rest, DOE with indoor activities, lower extremity edema, or increased fatigue. No spontaneous bleeding concerns. Discussed wearing a Zio monitor. Given his episode was in the setting of dehydration I do not think it is warranted at this time. He voices some concern with "trusting"  his heart to do his normal activities. Suggested obtaining an Apple smart watch to continually monitor his heart and be alerted if arrhythmia is detected. He is in agreement with not doing the Zio right now and will consider an Apple watch.  EKG today shows sinus rhythm with bigeminy.  Continue Bystolic and Eliquis. CAD.  S/p CABG times 09/09/1996.  LHC March 2020 showed patent LIMA to LAD and occluded left radial graft to RCA.  Patient denies chest pain, pressure or tightness since ER. He feels the pain he was experiencing during his ER visit was muscular as it occurred after doing a lot of yard work, remained sore for a couple of days then resolved on its own. Contnue rosuvastatin, Bystolic, Eliquis, as needed SL NTG.  Patient not on aspirin secondary to Eliquis. Chronic diastolic heart failure/aortic insufficiency.  Echo February 2024 showed EF 60 to 65%, mild calcification/thickening of tricuspid aortic valve without stenosis, mild AI.  Patient denies lower extremity edema, DOE with indoor activity, orthopnea, PND.  Euvolemic and well compensated on exam.  Continue Lasix and losartan. Aortic dilatation/aneurysm.  CTA chest aorta  February 2024 showed unchanged aneurysmal dilatation of aortic root and ascending aorta.  Patient denies chest pain, abdominal pain, back pain, lightheadedness, dizziness, presyncope, syncope.  Continue to follow with Dr. Lavinia Sharps CT surgery. Hypertension.  BP today 112/58.  Patient denies headaches or dizziness.  Continue amlodipine, losartan, and Bystolic. Hyperlipidemia.  LDL March 2024 38, at goal.  Continue rosuvastatin.  Disposition: Keep follow-up with Dr. Mayford Knife as previously planned or return sooner as needed.         Signed, Etta Grandchild. Beonka Amesquita, DNP, NP-C

## 2022-11-05 ENCOUNTER — Encounter: Payer: Self-pay | Admitting: Student

## 2022-11-05 ENCOUNTER — Ambulatory Visit: Payer: Medicare Other | Attending: Student | Admitting: Student

## 2022-11-05 VITALS — BP 112/58 | HR 71 | Ht 67.0 in | Wt 138.8 lb

## 2022-11-05 DIAGNOSIS — I351 Nonrheumatic aortic (valve) insufficiency: Secondary | ICD-10-CM | POA: Diagnosis present

## 2022-11-05 DIAGNOSIS — I2510 Atherosclerotic heart disease of native coronary artery without angina pectoris: Secondary | ICD-10-CM | POA: Diagnosis present

## 2022-11-05 DIAGNOSIS — I480 Paroxysmal atrial fibrillation: Secondary | ICD-10-CM

## 2022-11-05 DIAGNOSIS — I10 Essential (primary) hypertension: Secondary | ICD-10-CM

## 2022-11-05 DIAGNOSIS — I5032 Chronic diastolic (congestive) heart failure: Secondary | ICD-10-CM | POA: Diagnosis present

## 2022-11-05 DIAGNOSIS — I7121 Aneurysm of the ascending aorta, without rupture: Secondary | ICD-10-CM | POA: Insufficient documentation

## 2022-11-05 DIAGNOSIS — I77810 Thoracic aortic ectasia: Secondary | ICD-10-CM | POA: Diagnosis present

## 2022-11-05 NOTE — Patient Instructions (Signed)
Medication Instructions:  Your physician recommends that you continue on your current medications as directed. Please refer to the Current Medication list given to you today.  *If you need a refill on your cardiac medications before your next appointment, please call your pharmacy*   Lab Work: NONE If you have labs (blood work) drawn today and your tests are completely normal, you will receive your results only by: MyChart Message (if you have MyChart) OR A paper copy in the mail If you have any lab test that is abnormal or we need to change your treatment, we will call you to review the results.   Testing/Procedures: NONE   Follow-Up: At [facility], you and your health needs are our priority.  As part of our continuing mission to provide you with exceptional heart care, we have created designated Provider Care Teams.  These Care Teams include your primary Cardiologist (physician) and Advanced Practice Providers (APPs -  Physician Assistants and Nurse Practitioners) who all work together to provide you with the care you need, when you need it.  We recommend signing up for the patient portal called "MyChart".  Sign up information is provided on this After Visit Summary.  MyChart is used to connect with patients for Virtual Visits (Telemedicine).  Patients are able to view lab/test results, encounter notes, upcoming appointments, etc.  Non-urgent messages can be sent to your provider as well.   To learn more about what you can do with MyChart, go to [email or web address].    Your next appointment:   Please keep scheduled appointment.

## 2022-11-08 ENCOUNTER — Other Ambulatory Visit: Payer: Self-pay | Admitting: Adult Health

## 2022-11-08 ENCOUNTER — Other Ambulatory Visit: Payer: Self-pay | Admitting: Cardiology

## 2022-11-27 ENCOUNTER — Ambulatory Visit (INDEPENDENT_AMBULATORY_CARE_PROVIDER_SITE_OTHER): Payer: Medicare Other | Admitting: Neurology

## 2022-11-27 ENCOUNTER — Encounter: Payer: Self-pay | Admitting: Neurology

## 2022-11-27 VITALS — BP 123/66 | HR 60 | Ht 67.0 in | Wt 143.0 lb

## 2022-11-27 DIAGNOSIS — G43109 Migraine with aura, not intractable, without status migrainosus: Secondary | ICD-10-CM

## 2022-11-27 DIAGNOSIS — Z9989 Dependence on other enabling machines and devices: Secondary | ICD-10-CM

## 2022-11-27 DIAGNOSIS — F5104 Psychophysiologic insomnia: Secondary | ICD-10-CM

## 2022-11-27 DIAGNOSIS — R519 Headache, unspecified: Secondary | ICD-10-CM

## 2022-11-27 MED ORDER — ALPRAZOLAM ER 1 MG PO TB24: 1.0000 mg | ORAL_TABLET | Freq: Every day | ORAL | 1 refills | Status: DC

## 2022-11-27 NOTE — Patient Instructions (Signed)
Quality Sleep Information, Adult Quality sleep is important for your mental and physical health. It also improves your quality of life. Quality sleep means you: Are asleep for most of the time you are in bed. Fall asleep within 30 minutes. Wake up no more than once a night. Are awake for no longer than 20 minutes if you do wake up during the night. Most adults need 7-8 hours of quality sleep each night. How can poor sleep affect me? If you do not get enough quality sleep, you may have: Mood swings. Daytime sleepiness. Decreased alertness, reaction time, and concentration. Sleep disorders, such as insomnia and sleep apnea. Difficulty with: Solving problems. Coping with stress. Paying attention. These issues may affect your performance and productivity at work, school, and home. Lack of sleep may also put you at higher risk for accidents, suicide, and risky behaviors. If you do not get quality sleep, you may also be at higher risk for several health problems, including: Infections. Type 2 diabetes. Heart disease. High blood pressure. Obesity. Worsening of long-term conditions, like arthritis, kidney disease, depression, Parkinson's disease, and epilepsy. What actions can I take to get more quality sleep? Sleep schedule and routine Stick to a sleep schedule. Go to sleep and wake up at about the same time each day. Do not try to sleep less on weekdays and make up for lost sleep on weekends. This does not work. Limit naps during the day to 30 minutes or less. Do not take naps in the late afternoon. Make time to relax before bed. Reading, listening to music, or taking a hot bath promotes quality sleep. Make your bedroom a place that promotes quality sleep. Keep your bedroom dark, quiet, and at a comfortable room temperature. Make sure your bed is comfortable. Avoid using electronic devices that give off bright blue light for 30 minutes before bedtime. Your brain perceives bright blue  light as sunlight. This includes television, phones, and computers. If you are lying awake in bed for longer than 20 minutes, get up and do a relaxing activity until you feel sleepy. Lifestyle     Try to get at least 30 minutes of exercise on most days. Do not exercise 2-3 hours before going to bed. Do not use any products that contain nicotine or tobacco. These products include cigarettes, chewing tobacco, and vaping devices, such as e-cigarettes. If you need help quitting, ask your health care provider. Do not drink caffeinated beverages for at least 8 hours before going to bed. Coffee, tea, and some sodas contain caffeine. Do not drink alcohol or eat large meals close to bedtime. Try to get at least 30 minutes of sunlight every day. Morning sunlight is best. Medical concerns Work with your health care provider to treat medical conditions that may affect sleeping, such as: Nasal obstruction. Snoring. Sleep apnea and other sleep disorders. Talk to your health care provider if you think any of your prescription medicines may cause you to have difficulty falling or staying asleep. If you have sleep problems, talk with a sleep consultant. If you think you have a sleep disorder, talk with your health care provider about getting evaluated by a specialist. Where to find more information Sleep Foundation: sleepfoundation.org American Academy of Sleep Medicine: aasm.org Centers for Disease Control and Prevention (CDC): [employer] Contact a health care provider if: You have trouble getting to sleep or staying asleep. You often wake up very early in the morning and cannot get back to sleep. You have daytime  sleepiness. You have daytime sleep attacks of suddenly falling asleep and sudden muscle weakness (narcolepsy). You have a tingling sensation in your legs with a strong urge to move your legs (restless legs syndrome). You stop breathing briefly during sleep (sleep apnea). You think you have a  sleep disorder or are taking a medicine that is affecting your quality of sleep. Summary Most adults need 7-8 hours of quality sleep each night. Getting enough quality sleep is important for your mental and physical health. Make your bedroom a place that promotes quality sleep, and avoid things that may cause you to have poor sleep, such as alcohol, caffeine, smoking, or large meals. Talk to your health care provider if you have trouble falling asleep or staying asleep. This information is not intended to replace advice given to you by your health care provider. Make sure you discuss any questions you have with your health care provider. Document Revised: 10/31/2021 Document Reviewed: 10/31/2021 Elsevier Patient Education  2023 [employer] CPAP and BIPAP Information CPAP and BIPAP are methods that use air pressure to keep your airways open and to help you breathe well. CPAP and BIPAP use different amounts of pressure. Your health care provider will tell you whether CPAP or BIPAP would be more helpful for you. CPAP stands for "continuous positive airway pressure." With CPAP, the amount of pressure stays the same while you breathe in (inhale) and out (exhale). BIPAP stands for "bi-level positive airway pressure." With BIPAP, the amount of pressure will be higher when you inhale and lower when you exhale. This allows you to take larger breaths. CPAP or BIPAP may be used in the hospital, or your health care provider may want you to use it at home. You may need to have a sleep study before your health care provider can order a machine for you to use at home. What are the advantages? CPAP or BIPAP can be helpful if you have: Sleep apnea. Chronic obstructive pulmonary disease (COPD). Heart failure. Medical conditions that cause muscle weakness, including muscular dystrophy or amyotrophic lateral sclerosis (ALS). Other problems that cause breathing to be shallow, weak, abnormal, or difficult. CPAP  and BIPAP are most commonly used for obstructive sleep apnea (OSA) to keep the airways from collapsing when the muscles relax during sleep. What are the risks? Generally, this is a safe treatment. However, problems may occur, including: Irritated skin or skin sores if the mask does not fit properly. Dry or stuffy nose or nosebleeds. Dry mouth. Feeling gassy or bloated. Sinus or lung infection if the equipment is not cleaned properly. When should CPAP or BIPAP be used? In most cases, the mask only needs to be worn during sleep. Generally, the mask needs to be worn throughout the night and during any daytime naps. People with certain medical conditions may also need to wear the mask at other times, such as when they are awake. Follow instructions from your health care provider about when to use the machine. What happens during CPAP or BIPAP?  Both CPAP and BIPAP are provided by a small machine with a flexible plastic tube that attaches to a plastic mask that you wear. Air is blown through the mask into your nose or mouth. The amount of pressure that is used to blow the air can be adjusted on the machine. Your health care provider will set the pressure setting and help you find the best mask for you. Tips for using the mask Because the mask needs to be snug, some  people feel trapped or closed-in (claustrophobic) when first using the mask. If you feel this way, you may need to get used to the mask. One way to do this is to hold the mask loosely over your nose or mouth and then gradually apply the mask more snugly. You can also gradually increase the amount of time that you use the mask. Masks are available in various types and sizes. If your mask does not fit well, talk with your health care provider about getting a different one. Some common types of masks include: Full face masks, which fit over the mouth and nose. Nasal masks, which fit over the nose. Nasal pillow or prong masks, which fit into the  nostrils. If you are using a mask that fits over your nose and you tend to breathe through your mouth, a chin strap may be applied to help keep your mouth closed. Use a skin barrier to protect your skin as told by your health care provider. Some CPAP and BIPAP machines have alarms that may sound if the mask comes off or develops a leak. If you have trouble with the mask, it is very important that you talk with your health care provider about finding a way to make the mask easier to tolerate. Do not stop using the mask. There could be a negative impact on your health if you stop using the mask. Tips for using the machine Place your CPAP or BIPAP machine on a secure table or stand near an electrical outlet. Know where the on/off switch is on the machine. Follow instructions from your health care provider about how to set the pressure on your machine and when you should use it. Do not eat or drink while the CPAP or BIPAP machine is on. Food or fluids could get pushed into your lungs by the pressure of the CPAP or BIPAP. For home use, CPAP and BIPAP machines can be rented or purchased through home health care companies. Many different brands of machines are available. Renting a machine before purchasing may help you find out which particular machine works well for you. Your health insurance company may also decide which machine you may get. Keep the CPAP or BIPAP machine and attachments clean. Ask your health care provider for specific instructions. Check the humidifier if you have a dry stuffy nose or nosebleeds. Make sure it is working correctly. Follow these instructions at home: Take over-the-counter and prescription medicines only as told by your health care provider. Ask if you can take sinus medicine if your sinuses are blocked. Do not use any products that contain nicotine or tobacco. These products include cigarettes, chewing tobacco, and vaping devices, such as e-cigarettes. If you need help  quitting, ask your health care provider. Keep all follow-up visits. This is important. Contact a health care provider if: You have redness or pressure sores on your head, face, mouth, or nose from the mask or head gear. You have trouble using the CPAP or BIPAP machine. You cannot tolerate wearing the CPAP or BIPAP mask. Someone tells you that you snore even when wearing your CPAP or BIPAP. Get help right away if: You have trouble breathing. You feel confused. Summary CPAP and BIPAP are methods that use air pressure to keep your airways open and to help you breathe well. If you have trouble with the mask, it is very important that you talk with your health care provider about finding a way to make the mask easier to tolerate. Do not  stop using the mask. There could be a negative impact to your health if you stop using the mask. Follow instructions from your health care provider about when to use the machine. This information is not intended to replace advice given to you by your health care provider. Make sure you discuss any questions you have with your health care provider. Document Revised: 02/14/2021 Document Reviewed: 06/16/2020 Elsevier Patient Education  2023 [employer] Insomnia Insomnia is a sleep disorder that makes it difficult to fall asleep or stay asleep. Insomnia can cause fatigue, low energy, difficulty concentrating, mood swings, and poor performance at work or school. There are three different ways to classify insomnia: Difficulty falling asleep. Difficulty staying asleep. Waking up too early in the morning. Any type of insomnia can be long-term (chronic) or short-term (acute). Both are common. Short-term insomnia usually lasts for 3 months or less. Chronic insomnia occurs at least three times a week for longer than 3 months. What are the causes? Insomnia may be caused by another condition, situation, or substance, such as: Having certain mental health conditions, such  as anxiety and depression. Using caffeine, alcohol, tobacco, or drugs. Having gastrointestinal conditions, such as gastroesophageal reflux disease (GERD). Having certain medical conditions. These include: Asthma. Alzheimer's disease. Stroke. Chronic pain. An overactive thyroid gland (hyperthyroidism). Other sleep disorders, such as restless legs syndrome and sleep apnea. Menopause. Sometimes, the cause of insomnia may not be known. What increases the risk? Risk factors for insomnia include: Gender. Females are affected more often than males. Age. Insomnia is more common as people get older. Stress and certain medical and mental health conditions. Lack of exercise. Having an irregular work schedule. This may include working night shifts and traveling between different time zones. What are the signs or symptoms? If you have insomnia, the main symptom is having trouble falling asleep or having trouble staying asleep. This may lead to other symptoms, such as: Feeling tired or having low energy. Feeling nervous about going to sleep. Not feeling rested in the morning. Having trouble concentrating. Feeling irritable, anxious, or depressed. How is this diagnosed? This condition may be diagnosed based on: Your symptoms and medical history. Your health care provider may ask about: Your sleep habits. Any medical conditions you have. Your mental health. A physical exam. How is this treated? Treatment for insomnia depends on the cause. Treatment may focus on treating an underlying condition that is causing the insomnia. Treatment may also include: Medicines to help you sleep. Counseling or therapy. Lifestyle adjustments to help you sleep better. Follow these instructions at home: Eating and drinking  Limit or avoid alcohol, caffeinated beverages, and products that contain nicotine and tobacco, especially close to bedtime. These can disrupt your sleep. Do not eat a large meal or eat spicy  foods right before bedtime. This can lead to digestive discomfort that can make it hard for you to sleep. Sleep habits  Keep a sleep diary to help you and your health care provider figure out what could be causing your insomnia. Write down: When you sleep. When you wake up during the night. How well you sleep and how rested you feel the next day. Any side effects of medicines you are taking. What you eat and drink. Make your bedroom a dark, comfortable place where it is easy to fall asleep. Put up shades or blackout curtains to block light from outside. Use a white noise machine to block noise. Keep the temperature cool. Limit screen use before bedtime. This includes:  Not watching TV. Not using your smartphone, tablet, or computer. Stick to a routine that includes going to bed and waking up at the same times every day and night. This can help you fall asleep faster. Consider making a quiet activity, such as reading, part of your nighttime routine. Try to avoid taking naps during the day so that you sleep better at night. Get out of bed if you are still awake after 15 minutes of trying to sleep. Keep the lights down, but try reading or doing a quiet activity. When you feel sleepy, go back to bed. General instructions Take over-the-counter and prescription medicines only as told by your health care provider. Exercise regularly as told by your health care provider. However, avoid exercising in the hours right before bedtime. Use relaxation techniques to manage stress. Ask your health care provider to suggest some techniques that may work well for you. These may include: Breathing exercises. Routines to release muscle tension. Visualizing peaceful scenes. Make sure that you drive carefully. Do not drive if you feel very sleepy. Keep all follow-up visits. This is important. Contact a health care provider if: You are tired throughout the day. You have trouble in your daily routine due to  sleepiness. You continue to have sleep problems, or your sleep problems get worse. Get help right away if: You have thoughts about hurting yourself or someone else. Get help right away if you feel like you may hurt yourself or others, or have thoughts about taking your own life. Go to your nearest emergency room or: Call 911. Call the National Suicide Prevention Lifeline at (205)199-7359 or 988. This is open 24 hours a day. Text the Crisis Text Line at (386)133-1500. Summary Insomnia is a sleep disorder that makes it difficult to fall asleep or stay asleep. Insomnia can be long-term (chronic) or short-term (acute). Treatment for insomnia depends on the cause. Treatment may focus on treating an underlying condition that is causing the insomnia. Keep a sleep diary to help you and your health care provider figure out what could be causing your insomnia. This information is not intended to replace advice given to you by your health care provider. Make sure you discuss any questions you have with your health care provider. Document Revised: 06/18/2021 Document Reviewed: 06/18/2021 Elsevier Patient Education  2023 [employer].

## 2022-11-27 NOTE — Progress Notes (Signed)
Provider:  Melvyn Novas, MD  Primary Care Physician:  Zoila Shutter, MD [address] [place] [address]     Referring Provider: Zoila Shutter, Md [address] [place],  [place] [address]          Chief Complaint according to patient   Patient presents with:     SLEEP CLINIC VISIT Patient (Initial Visit)           HISTORY OF PRESENT ILLNESS:  [name] is a 77 y.o. male patient who is here for revisit 11/27/2022 for  a follow up on CPAP compliance , complex apnea and insomnia. His headaches are well controlled. He sleeps well.  .  Chief concern according to patient :  just refills and compliance visit         Review of Systems: Out of a complete 14 system review, the patient complains of only the following symptoms, and all other reviewed systems are negative.:  Fatigue, sleepiness , snoring, all well controlled on CPAP at 9 cm water.   Insomnia, on Xanax.   Nocturia  reduced from 3-4  nightly to one - on CIALIS for nocturia and testosterone binder.    How likely are you to doze in the following situations: 0 = not likely, 1 = slight chance, 2 = moderate chance, 3 = high chance   Sitting and Reading? Watching Television? Sitting inactive in a public place (theater or meeting)? As a passenger in a car for an hour without a break? Lying down in the afternoon when circumstances permit? Sitting and talking to someone? Sitting quietly after lunch without alcohol? In a car, while stopped for a few minutes in traffic?   Total = 1/ 24 points   FSS endorsed at 14/ 63 points.   Social History   Socioeconomic History   Marital status: Divorced    Spouse name: Not on file   Number of children: 0   Years of education: Not on file   Highest education level: Not on file  Occupational History   Occupation: retired  Tobacco Use   Smoking status: Former    Years: 20    Types: Cigarettes    Quit date:  07/22/1986    Years since quitting: 36.3   Smokeless tobacco: Never   Tobacco comments:    Former smoker 05/09/2021  Vaping Use   Vaping Use: Never used  Substance and Sexual Activity   Alcohol use: Yes    Alcohol/week: 1.0 standard drink of alcohol    Types: 1 Glasses of wine per week    Comment: 0-1 daily   Drug use: No   Sexual activity: Not on file  Other Topics Concern   Not on file  Social History Narrative   Divorced   Publishing rights manager- retired   No children   Has a Medical laboratory scientific officer named Kirt Boys   Enjoys outdoor activities- hiking, swimming, Media planner, baseball, writing, reading, cooking   Social Determinants of Corporate investment banker Strain: Not on file  Food Insecurity: Not on file  Transportation Needs: Not on file  Physical Activity: Not on file  Stress: Not on file  Social Connections: Not on file    Family History  Problem Relation Age of Onset   Arthritis Mother        deceased   Hyperlipidemia Mother    Diabetes Mother    Arthritis Father    Hyperlipidemia  Father    Heart disease Father 64   Stroke Father 51       deceased   Hypertension Father    Diabetes Father    Kidney disease Paternal Grandfather    Colon cancer Neg Hx    Colon polyps Neg Hx    Esophageal cancer Neg Hx    Stomach cancer Neg Hx    Rectal cancer Neg Hx    Sleep apnea Neg Hx     Past Medical History:  Diagnosis Date   Anal fissure    Aortic insufficiency    Arthritis    Atrial flutter (HCC)    Bicuspid aortic valve    with mild to moderate AR by echo 08/2019   BPH (benign prostatic hyperplasia)    CAD (coronary artery disease)    s/p CABG [place] 1998   Cataract    bilateral-removed   CKD (chronic kidney disease), stage III (HCC)    Depression    Dilated aortic root (HCC)    History of blood transfusion 1998   "due to cardiac bypass surgery"   Hyperlipidemia    Hypertension    Hypogonadism in male    Kidney stones    Migraine    Mild carotid artery disease (HCC)  2009   Myocardial infarction (HCC)    PVC's (premature ventricular contractions)    Retinal hemorrhage 11/04/2014   Sleep apnea with use of continuous positive airway pressure (CPAP)    Thoracic aortic aneurysm (TAA) (HCC)    51mm aoartic root and 47mm ascending aorta by echo 08/2022    Past Surgical History:  Procedure Laterality Date   A-FLUTTER ABLATION N/A 09/29/2019   Procedure: A-FLUTTER ABLATION;  Surgeon: Regan Lemming, MD;  Location: [facility];  Service: Cardiovascular;  Laterality: N/A;   ATRIAL FIBRILLATION ABLATION N/A 08/02/2021   Procedure: ATRIAL FIBRILLATION ABLATION;  Surgeon: Regan Lemming, MD;  Location: [facility];  Service: Cardiovascular;  Laterality: N/A;   CARDIOVERSION N/A 08/10/2019   Procedure: CARDIOVERSION;  Surgeon: Parke Poisson, MD;  Location: [facility] ENDOSCOPY;  Service: Cardiovascular;  Laterality: N/A;   CARDIOVERSION N/A 05/21/2021   Procedure: CARDIOVERSION;  Surgeon: Wendall Stade, MD;  Location: [facility] ENDOSCOPY;  Service: Cardiovascular;  Laterality: N/A;   CARDIOVERSION N/A 07/12/2021   Procedure: CARDIOVERSION;  Surgeon: Jake Bathe, MD;  Location: [facility] ENDOSCOPY;  Service: Cardiovascular;  Laterality: N/A;   cataracts     COLONOSCOPY     CORONARY ARTERY BYPASS GRAFT  1998   LEFT HEART CATH AND CORONARY ANGIOGRAPHY N/A 08/24/2018   Procedure: LEFT HEART CATH AND CORONARY ANGIOGRAPHY;  Surgeon: Runell Gess, MD;  Location: [facility];  Service: Cardiovascular;  Laterality: N/A;   TEE WITHOUT CARDIOVERSION N/A 01/19/2015   Procedure: TRANSESOPHAGEAL ECHOCARDIOGRAM (TEE);  Surgeon: Quintella Reichert, MD;  Location: [facility] ENDOSCOPY;  Service: Cardiovascular;  Laterality: N/A;   TOOTH EXTRACTION     VARICOSE VEIN SURGERY  1984   pt denies     Current Outpatient Medications on File Prior to Visit  Medication Sig Dispense Refill   ALPRAZolam (ALPRAZOLAM XR) 1 MG 24 hr tablet Take 1 tablet (1 mg total) by mouth  daily. 90 tablet 1   amLODipine (NORVASC) 5 MG tablet Take 1 tablet (5 mg total) by mouth daily. 90 tablet 2   apixaban (ELIQUIS) 5 MG TABS tablet Take 1 tablet (5 mg total) by mouth 2 (two) times daily. 60 tablet 11  Ascorbic Acid (VITAMIN C) 1000 MG tablet Take 1,000 mg by mouth 2 (two) times daily.     aspirin-acetaminophen-caffeine (EXCEDRIN MIGRAINE) 250-250-65 MG tablet Take by mouth as needed for headache.     buPROPion (WELLBUTRIN XL) 300 MG 24 hr tablet Take 300 mg by mouth daily.     butalbital-aspirin-caffeine-codeine (FIORINAL WITH CODEINE) 50-325-40-30 MG capsule TAKE 1 CAPSULE BY MOUTH EVERY 4 HOURS AS NEEDED FOR PAIN 30 capsule 5   diazepam (VALIUM) 5 MG tablet Take 2.5-5 mg by mouth 2 (two) times daily as needed for anxiety.      furosemide (LASIX) 20 MG tablet TAKE ONE TABLET BY MOUTH DAILY 90 tablet 1   ibuprofen (ADVIL,MOTRIN) 200 MG tablet Take 200-400 mg by mouth every 8 (eight) hours as needed (for arthritis in the feet).     losartan (COZAAR) 25 MG tablet TAKE ONE TABLET BY MOUTH DAILY 90 tablet 2   Nebivolol HCl (BYSTOLIC) 20 MG TABS Take 1 tablet (20 mg total) by mouth daily. 90 tablet 2   niacin 500 MG tablet Take 500 mg by mouth at bedtime.     rosuvastatin (CRESTOR) 20 MG tablet TAKE ONE TABLET BY MOUTH DAILY 90 tablet 3   Specialty Vitamins Products (CENTRUM SPECIALIST ENERGY) TABS Take 1 tablet by mouth daily with breakfast.     tadalafil (CIALIS) 5 MG tablet Take 5 mg by mouth every evening.     Testosterone 40.5 MG/2.5GM (1.62%) GEL Place 4 Pump onto the skin daily. Apply 4 pumps to each shoulder every morning     traZODone (DESYREL) 50 MG tablet TAKE 1 AND 1/2 TABLET BY MOUTH EVERY NIGHT AT BEDTIME 135 tablet 1   Turmeric 500 MG CAPS Take 500 mg by mouth daily.     vitamin E 400 UNIT capsule Take 400 Units by mouth at bedtime.      No current facility-administered medications on file prior to visit.    Allergies  Allergen Reactions   Dog Epithelium (Canis  Lupus Familiaris) Itching, Anxiety, Palpitations, Other (See Comments), Cough and Shortness Of Breath    Other reaction(s): Respiratory Distress (ALLERGY/intolerance), Wheezing (ALLERGY/intolerance)   Mushroom Extract Complex Nausea Only and Other (See Comments)    Severe vertigo and headaches     DIAGNOSTIC DATA (LABS, IMAGING, TESTING) - I reviewed patient records, labs, notes, testing and imaging myself where available.  Calculated pAHI (per hour):    44.1/hour    IMPRESSION:   This HST 6- 12-2020  still indicated severe OSA (obstructive sleep apnea) , minor hypoxemia- clinically not relevant at this level. Sleep Position has little influence on apnea severity.   RECOMMENDATION: This degree of sleep apnea requires therapy- he has been using a CPAP machine that is now 75 years old and needs to be replaced.   REC: further treatment with PAP ( positive airway pressure ) auto setting 6 through 16 cm water,3 cm EPR and mask of his choice.               Lab Results  Component Value Date   WBC 8.3 10/10/2022   HGB 12.6 (L) 10/10/2022   HCT 37.8 (L) 10/10/2022   MCV 105.6 (H) 10/10/2022   PLT 180 10/10/2022      Component Value Date/Time   NA 138 10/10/2022 0446   NA 138 09/07/2021 0841   K 3.6 10/10/2022 0446   CL 104 10/10/2022 0446   CO2 26 10/10/2022 0446   GLUCOSE 143 (H) 10/10/2022 0446   BUN  24 (H) 10/10/2022 0446   BUN 25 09/07/2021 0841   CREATININE 1.67 (H) 10/10/2022 0446   CALCIUM 8.3 (L) 10/10/2022 0446   PROT 6.1 09/30/2022 0818   ALBUMIN 4.2 09/30/2022 0818   AST 32 09/30/2022 0818   ALT 18 09/30/2022 0818   ALKPHOS 113 09/30/2022 0818   BILITOT 0.3 09/30/2022 0818   GFRNONAA 43 (L) 10/10/2022 0446   GFRAA 64 07/27/2020 0954   Lab Results  Component Value Date   CHOL 95 (L) 09/30/2022   HDL 35 (L) 09/30/2022   LDLCALC 38 09/30/2022   TRIG 124 09/30/2022   CHOLHDL 2.7 09/30/2022   Lab Results  Component Value Date   HGBA1C 5.9 (H) 08/22/2018   Lab  Results  Component Value Date   VITAMINB12 721 09/24/2018   Lab Results  Component Value Date   TSH 2.760 04/27/2021    PHYSICAL EXAM:  Today's Vitals   11/27/22 1033  BP: 123/66  Pulse: 60  Weight: 143 lb (64.9 kg)  Height: 5\' 7"  (1.702 m)   Body mass index is 22.4 kg/m.   Wt Readings from Last 3 Encounters:  11/27/22 143 lb (64.9 kg)  11/05/22 138 lb 12.8 oz (63 kg)  10/10/22 142 lb (64.4 kg)     Ht Readings from Last 3 Encounters:  11/27/22 5\' 7"  (1.702 m)  11/05/22 5\' 7"  (1.702 m)  10/10/22 5\' 7"  (1.702 m)      General:  The patient is awake, alert and appears not in acute distress. The patient is well groomed. Head: Normocephalic, atraumatic. Neck is supple. Mallampati 2 ,  Dentures partial neck circumference:15. Nasal airflow patent , TMJ not evident. Retrognathia is seen.  Cardiovascular:  Regular rate and rhythm , without murmurs or carotid bruit, and without distended neck veins. Respiratory: Lungs are clear to auscultation.Skin:  Without evidence of edema, or rashTrunk: BMI is normal. The patient's posture is erect    Neurologic exam : The patient is awake and alert, oriented to place and time.   Attention span & concentration ability appears impaired. Speech is fluent, without dysarthria, dysphonia or aphasia.  Mood and affect are appropriate.   Cranial nerves:  No change in taste or smell. Pupils are equal and briskly reactive to light. Funduscopic exam without evidence of pallor or edema.  Extraocular movements  in vertical and horizontal planes intact and without nystagmus. Visual fields by finger perimetry are intact. Hearing impaired.   Facial sensation intact to fine touch. Facial motor strength is symmetric and tongue and uvula move midline.  Shoulder shrug was symmetrical.    Normal muscle bulk and tone,  no tremor, ataxia or dysmetria, and preserved symmetric DTR.       ASSESSMENT AND PLAN 77 y.o. year old male  here with: Overall feels  like headaches have been well managed. And sleep is well with current treatment, Xanax XR along with trazodone and CPAP issued June 2022 by DME [facility]     1) SA on CPAP, set at 9 cm water , baseline AHI was severe OSA 12-25-2020, machine was issued following HST in June 2022.  He has had central apneas emerging, but currently his AHI is only 4.3/h. at10 cm water . No changes needed.    2) Insomnia has improved , he is using benzodiazepine medication for prn use, never refills too early, has never had increase in dose.  Alprazolam 1 mg XR release form.  He has been prescribed valium for panic attacks, he may switch  to use the xanax for both indications, needs to speak to Dr. Coralee Pesa, MD [facility]. Marland Kitchen   3) Headaches have been rare now, he uses 2-4 tabs of Butalbital with codeine and caffeine a month.  I have been refilling these meds.    4) he has reported night sweats on testosterone medication- and his Eloquis can interfere with this.   I plan to follow up either personally or through our NP within 6 months due to the medications he takes, schedule 2.   I will offer virtual follow up with NP and physical follow up I with me.   I would like to thank  Zoila Shutter, Md [address] [place],  [place] [address] for allowing me to meet with and to take care of this pleasant patient.    After spending a total time of  30  minutes face to face and additional time for physical and neurologic examination, review of laboratory studies,  personal review of imaging studies, reports and results of other testing and review of referral information / records as far as provided in visit,   Electronically signed by: Melvyn Novas, MD 11/27/2022 11:05 AM  [facility] Board certified by The [employer] of Sleep Medicine and Diplomate of the [employer] of Sleep Medicine. Board certified In  Neurology through the ABPN, Fellow of the [employer] of Neurology. Medical Director of [facility].

## 2022-12-21 ENCOUNTER — Other Ambulatory Visit: Payer: Self-pay | Admitting: Cardiology

## 2023-02-13 ENCOUNTER — Other Ambulatory Visit: Payer: Self-pay | Admitting: Cardiology

## 2023-02-19 ENCOUNTER — Encounter: Payer: Self-pay | Admitting: Gastroenterology

## 2023-03-16 NOTE — Progress Notes (Unsigned)
Cardiology Office Note Date:  03/16/2023  Patient ID:  Drew, [name] 1946/06/11, MRN [id number] PCP:  Zoila Shutter, MD  Cardiologist:  Dr. Mayford Knife Electrophysiologist: Dr. Elberta Fortis    Chief Complaint:  *** 53mo  History of Present Illness: [name] is a 77 y.o. male with history of HTN, HLD, CAD (CABG 1998). Afib/AFlutter, VHD bicuspid AV w/AI, dilated ascending AO/aneurysm of root (following with Dr. Nevada Crane), OSA (follows with neurology), CKD (III), PVCs, hx of retinal hemorrhage, hypogonadism  He comes in today to be seen for Dr. Elberta Fortis, last seen by him 10/30/21, doing well post PVI ablation, amio was stopped.   dental extractions planned for 02/20/22, pre-op pool addressed, no antibiotics needed, OK to hold eliquis a day  I saw him 02/28/23 He starts today by explaining that he at baseline is a very anxious person, and of late extremely so with a break up with his girlfriend and his pain after his dental extraction. He saw his PMD 2 weeks ago for increased anxiety, struggles with living alone, and unintended weight loss He gets lightheaded with his anxiety, is again today He continues to wake with blood in his mouth and is quite worried and remains in a lot of pain, has been started on antibiotic and pain meds via his dentist though is now out of town. He estimates he has probably lost several ounces of blood the last few days No CP, no palpitations He does not think that he has had AFib No syncope No bleeding outside the of his recent dental extractions and some ongoing oozing Advised to see his PMD for his anxiety, dentist for f/u as well,  offered CBC though he declined, planned 6 mo EP visit  Jan 2024 more dental extractions/implants  Saw Dr. Elberta Fortis 09/16/22, feeling well, no symptoms of arrhythmias, no changes were made.  Saw Gen cards team most recently 11/05/22, post hospital, recent personal stressors, c/o CP, was in AFib 104bpm, spontaneous conversion,  discharged from the ER.  Pt suspected triggered by being dehydrated, no plans for monitoring.     Device information AFlutter Diagnosed 2020 Nov 2020: CT with nonspecific ILD Amiodarone started Jan 2021  AFlutter ablation 09/29/2019  Amiodarone stopped April 2021 Afib found Oct 2022 Amiodrone restarted  Nov 2022 PVI ablation 08/02/21 Amiodarone stopped April 2023  Past Medical History:  Diagnosis Date   Anal fissure    Aortic insufficiency    Arthritis    Atrial flutter (HCC)    Bicuspid aortic valve    with mild to moderate AR by echo 08/2019   BPH (benign prostatic hyperplasia)    CAD (coronary artery disease)    s/p CABG [place] 1998   Cataract    bilateral-removed   CKD (chronic kidney disease), stage III (HCC)    Depression    Dilated aortic root (HCC)    History of blood transfusion 1998   "due to cardiac bypass surgery"   Hyperlipidemia    Hypertension    Hypogonadism in male    Kidney stones    Migraine    Mild carotid artery disease (HCC) 2009   Myocardial infarction (HCC)    PVC's (premature ventricular contractions)    Retinal hemorrhage 11/04/2014   Sleep apnea with use of continuous positive airway pressure (CPAP)    Thoracic aortic aneurysm (TAA) (HCC)    51mm aoartic root and 47mm ascending aorta by echo 08/2022    Past Surgical History:  Procedure Laterality Date  A-FLUTTER ABLATION N/A 09/29/2019   Procedure: A-FLUTTER ABLATION;  Surgeon: Regan Lemming, MD;  Location: [facility];  Service: Cardiovascular;  Laterality: N/A;   ATRIAL FIBRILLATION ABLATION N/A 08/02/2021   Procedure: ATRIAL FIBRILLATION ABLATION;  Surgeon: Regan Lemming, MD;  Location: [facility];  Service: Cardiovascular;  Laterality: N/A;   CARDIOVERSION N/A 08/10/2019   Procedure: CARDIOVERSION;  Surgeon: Parke Poisson, MD;  Location: [facility] ENDOSCOPY;  Service: Cardiovascular;  Laterality: N/A;   CARDIOVERSION N/A 05/21/2021   Procedure:  CARDIOVERSION;  Surgeon: Wendall Stade, MD;  Location: [facility] ENDOSCOPY;  Service: Cardiovascular;  Laterality: N/A;   CARDIOVERSION N/A 07/12/2021   Procedure: CARDIOVERSION;  Surgeon: Jake Bathe, MD;  Location: [facility] ENDOSCOPY;  Service: Cardiovascular;  Laterality: N/A;   cataracts     COLONOSCOPY     CORONARY ARTERY BYPASS GRAFT  1998   LEFT HEART CATH AND CORONARY ANGIOGRAPHY N/A 08/24/2018   Procedure: LEFT HEART CATH AND CORONARY ANGIOGRAPHY;  Surgeon: Runell Gess, MD;  Location: [facility] CV [facility];  Service: Cardiovascular;  Laterality: N/A;   TEE WITHOUT CARDIOVERSION N/A 01/19/2015   Procedure: TRANSESOPHAGEAL ECHOCARDIOGRAM (TEE);  Surgeon: Quintella Reichert, MD;  Location[facility] ENDOSCOPY;  Service: Cardiovascular;  Laterality: N/A;   TOOTH EXTRACTION     VARICOSE VEIN SURGERY  1984   pt denies    Current Outpatient Medications  Medication Sig Dispense Refill   ALPRAZolam (ALPRAZOLAM XR) 1 MG 24 hr tablet Take 1 tablet (1 mg total) by mouth daily. 90 tablet 1   amLODipine (NORVASC) 5 MG tablet Take 1 tablet (5 mg total) by mouth daily. 90 tablet 2   apixaban (ELIQUIS) 5 MG TABS tablet Take 1 tablet (5 mg total) by mouth 2 (two) times daily. 60 tablet 11   Ascorbic Acid (VITAMIN C) 1000 MG tablet Take 1,000 mg by mouth 2 (two) times daily.     aspirin-acetaminophen-caffeine (EXCEDRIN MIGRAINE) 250-250-65 MG tablet Take by mouth as needed for headache.     buPROPion (WELLBUTRIN XL) 300 MG 24 hr tablet Take 300 mg by mouth daily.     butalbital-aspirin-caffeine-codeine (FIORINAL WITH CODEINE) 50-325-40-30 MG capsule TAKE 1 CAPSULE BY MOUTH EVERY 4 HOURS AS NEEDED FOR PAIN 30 capsule 5   diazepam (VALIUM) 5 MG tablet Take 2.5-5 mg by mouth 2 (two) times daily as needed for anxiety.      furosemide (LASIX) 20 MG tablet TAKE 1 TABLET BY MOUTH DAILY 90 tablet 2   ibuprofen (ADVIL,MOTRIN) 200 MG tablet Take 200-400 mg by mouth every 8 (eight) hours as needed (for arthritis in the feet).      losartan (COZAAR) 25 MG tablet TAKE ONE TABLET BY MOUTH DAILY 90 tablet 2   Nebivolol HCl (BYSTOLIC) 20 MG TABS Take 1 tablet (20 mg total) by mouth daily. 90 tablet 2   niacin 500 MG tablet Take 500 mg by mouth at bedtime.     rosuvastatin (CRESTOR) 20 MG tablet TAKE 1 TABLET BY MOUTH DAILY 90 tablet 3   Specialty Vitamins Products (CENTRUM SPECIALIST ENERGY) TABS Take 1 tablet by mouth daily with breakfast.     tadalafil (CIALIS) 5 MG tablet Take 5 mg by mouth every evening.     Testosterone 40.5 MG/2.5GM (1.62%) GEL Place 4 Pump onto the skin daily. Apply 4 pumps to each shoulder every morning     traZODone (DESYREL) 50 MG tablet TAKE 1 AND 1/2 TABLET BY MOUTH EVERY NIGHT AT BEDTIME 135 tablet  1   Turmeric 500 MG CAPS Take 500 mg by mouth daily.     vitamin E 400 UNIT capsule Take 400 Units by mouth at bedtime.      No current facility-administered medications for this visit.    Allergies:   Dog epithelium (canis lupus familiaris) and Mushroom extract complex   Social History:  The patient  reports that he quit smoking about 36 years ago. His smoking use included cigarettes. He started smoking about 56 years ago. He has never used smokeless tobacco. He reports current alcohol use of about 1.0 standard drink of alcohol per week. He reports that he does not use drugs.   Family History:  The patient's family history includes Arthritis in his father and mother; Diabetes in his father and mother; Heart disease (age of onset: 94) in his father; Hyperlipidemia in his father and mother; Hypertension in his father; Kidney disease in his paternal grandfather; Stroke (age of onset: 48) in his father.  ROS:  Please see the history of present illness.    All other systems are reviewed and otherwise negative.   PHYSICAL EXAM:  VS:  There were no vitals taken for this visit. BMI: There is no height or weight on file to calculate BMI. Well nourished, well developed, in no acute distress HEENT:  normocephalic, atraumatic Neck: no JVD, carotid bruits or masses Cardiac: *** RRR; no significant murmurs, no rubs, or gallops Lungs: *** CTA b/l, no wheezing, rhonchi or rales Abd: soft, nontender MS: no deformity or atrophy Ext: *** no edema Skin: warm and dry, no rash Neuro:  No gross deficits appreciated Psych: euthymic mood, full affect   EKG:  not done today   08/23/2021: TTE 1. Left ventricular ejection fraction, by estimation, is 60 to 65%. Left  ventricular ejection fraction by 3D volume is 62 %. The left ventricle has  normal function. The left ventricle has no regional wall motion  abnormalities. There is moderate  concentric left ventricular hypertrophy. Left ventricular diastolic  parameters were normal. The average left ventricular global longitudinal  strain is -21.7 %. The global longitudinal strain is normal.   2. Right ventricular systolic function is normal. The right ventricular  size is normal. There is normal pulmonary artery systolic pressure. The  estimated right ventricular systolic pressure is 24.3 mmHg.   3. Left atrial size was mildly dilated.   4. The mitral valve is normal in structure. Trivial mitral valve  regurgitation. No evidence of mitral stenosis.   5. The aortic valve has an indeterminant number of cusps. Aortic valve  regurgitation is mild to moderate. Aortic valve sclerosis is present, with  no evidence of aortic valve stenosis. Aortic regurgitation PHT measures  797 msec.   6. Aortic dilatation noted. Aneurysm of the aortic root, measuring 46 mm.  Aneurysm of the ascending aorta, measuring 47 mm.   7. The inferior vena cava is normal in size with greater than 50%  respiratory variability, suggesting right atrial pressure of 3 mmHg.   08/02/2021: EPS/Ablation CONCLUSIONS: 1. Sinus rhythm upon presentation.   2. Successful electrical isolation and anatomical encircling of all four pulmonary veins with radiofrequency current.  A WACA  approach was used 3. Additional left atrial ablation was performed with a standard box lesion created along the posterior wall of the left atrium 4. No early apparent complications.  09/29/2019: EPS/ablation  CONCLUSIONS:   1. Isthmus-dependent right atrial flutter upon presentation.   2. Successful radiofrequency ablation of atrial flutter along the  cavotricuspid isthmus with complete bidirectional isthmus block achieved.   3. No inducible arrhythmias following ablation.   4. No early apparent complications.   Recent Labs: 09/30/2022: ALT 18 10/10/2022: BUN 24; Creatinine, Ser 1.67; Hemoglobin 12.6; Magnesium 2.2; Platelets 180; Potassium 3.6; Sodium 138  09/30/2022: Chol/HDL Ratio 2.7; Cholesterol, Total 95; HDL 35; LDL Chol Calc (NIH) 38; Triglycerides 124   CrCl cannot be calculated (Patient's most recent lab result is older than the maximum 21 days allowed.).   Wt Readings from Last 3 Encounters:  11/27/22 143 lb (64.9 kg)  11/05/22 138 lb 12.8 oz (63 kg)  10/10/22 142 lb (64.4 kg)     Other studies reviewed: Additional studies/records reviewed today include: summarized above  ASSESSMENT AND PLAN:  AFlutter (typical) ablated Persistent AFib S/p ablation CHA2DS2Vasc is 3, on Eliquis, *** appropriately dosed *** No/low burden by symptoms Off amiodarone   CAD *** No anginal symptoms C/w Dr. Mayford Knife  VHD Bicuspid AV Mild - mod AI *** C/w Dr. Mayford Knife  Aortic aneurysm Follows with CTS    Disposition: ***  Current medicines are reviewed at length with the patient today.  The patient did not have any concerns regarding medicines.  Norma Fredrickson, PA-C 03/16/2023 11:45 AM     [facility] [address] [place] [address] [phone] (office)  [phone] (fax)

## 2023-03-17 ENCOUNTER — Ambulatory Visit: Payer: Medicare Other | Admitting: Physician Assistant

## 2023-03-18 ENCOUNTER — Ambulatory Visit: Payer: Medicare Other | Attending: Physician Assistant | Admitting: Physician Assistant

## 2023-03-18 ENCOUNTER — Encounter: Payer: Self-pay | Admitting: Physician Assistant

## 2023-03-18 VITALS — BP 118/60 | HR 61 | Ht 67.0 in | Wt 143.0 lb

## 2023-03-18 DIAGNOSIS — I2510 Atherosclerotic heart disease of native coronary artery without angina pectoris: Secondary | ICD-10-CM | POA: Diagnosis present

## 2023-03-18 DIAGNOSIS — Q231 Congenital insufficiency of aortic valve: Secondary | ICD-10-CM | POA: Insufficient documentation

## 2023-03-18 DIAGNOSIS — I4819 Other persistent atrial fibrillation: Secondary | ICD-10-CM | POA: Diagnosis present

## 2023-03-18 DIAGNOSIS — D6869 Other thrombophilia: Secondary | ICD-10-CM | POA: Insufficient documentation

## 2023-03-18 NOTE — Patient Instructions (Signed)
Medication Instructions:   Your physician recommends that you continue on your current medications as directed. Please refer to the Current Medication list given to you today.  *If you need a refill on your cardiac medications before your next appointment, please call your pharmacy*   Lab Work: NONE ORDERED  TODAY    If you have labs (blood work) drawn today and your tests are completely normal, you will receive your results only by: MyChart Message (if you have MyChart) OR A paper copy in the mail If you have any lab test that is abnormal or we need to change your treatment, we will call you to review the results.   Testing/Procedures: NONE ORDERED  TODAY    Follow-Up: At [facility], you and your health needs are our priority.  As part of our continuing mission to provide you with exceptional heart care, we have created designated Provider Care Teams.  These Care Teams include your primary Cardiologist (physician) and Advanced Practice Providers (APPs -  Physician Assistants and Nurse Practitioners) who all work together to provide you with the care you need, when you need it.  We recommend signing up for the patient portal called "MyChart".  Sign up information is provided on this After Visit Summary.  MyChart is used to connect with patients for Virtual Visits (Telemedicine).  Patients are able to view lab/test results, encounter notes, upcoming appointments, etc.  Non-urgent messages can be sent to your provider as well.   To learn more about what you can do with MyChart, go to [email or web address].    Your next appointment:  MOVE DR TURNER APPOINTMENT  2 -3 month(s) out OUT   Provider:   Armanda Magic, MD    IN ONE YEAR DR CAMNITZ/ URSUY    Other Instructions

## 2023-03-19 ENCOUNTER — Ambulatory Visit (INDEPENDENT_AMBULATORY_CARE_PROVIDER_SITE_OTHER): Payer: Medicare Other | Admitting: Orthopaedic Surgery

## 2023-03-19 ENCOUNTER — Encounter: Payer: Self-pay | Admitting: Orthopaedic Surgery

## 2023-03-19 DIAGNOSIS — S7012XA Contusion of left thigh, initial encounter: Secondary | ICD-10-CM | POA: Diagnosis not present

## 2023-03-19 NOTE — Progress Notes (Signed)
The patient is a 77 year old gentleman that I am seeing for the first time.  He injured his left hip about 3 weeks ago when he hit it on a table.  He developed a hematoma.  He was seen at [facility] I believe in [place] or somewhere in the did drain some fluid from this area.  He has had a reaccumulation.  He is on Eliquis.  He said his cardiologist that he can come off Eliquis for a few days if needed.  He is able to walk without any type of limp.  He denies any significant pain at all.  Is more of the discomfort close he is a very thin individual with this large hematoma.  He does have a large seroma or hematoma at the lateral aspect of his hip around the trochanteric area.  I was able to use a 18-gauge needle and aspirated about 80 cc of bloody fluid from this area to completely decompress it.  I do not think he really needs to stop his Eliquis but he can have a reoccurrence of this.  He can certainly try compressive garment to wear and we will see him back in a week to see if this needs to be aspirated again.  All question concerns were answered and addressed.

## 2023-04-02 ENCOUNTER — Ambulatory Visit (INDEPENDENT_AMBULATORY_CARE_PROVIDER_SITE_OTHER): Payer: Medicare Other | Admitting: Orthopaedic Surgery

## 2023-04-02 ENCOUNTER — Encounter: Payer: Self-pay | Admitting: Orthopaedic Surgery

## 2023-04-02 DIAGNOSIS — S7012XA Contusion of left thigh, initial encounter: Secondary | ICD-10-CM | POA: Diagnosis not present

## 2023-04-02 NOTE — Progress Notes (Signed)
The patient comes in today for follow-up as a relates to a traumatic hematoma/seroma with a Susette Racer lesion around his left hip area.  At his office visit recently we were able to aspirate a large amount of fluid from this area.  He said it is gone down significantly and continues to decrease.  On exam he has just a small fluid collection at this area at this point.  It feels more firm like a hematoma.  We did not aspirate anything today since it is gotten much smaller and he is more comfortable.  If this worsens anyway he knows to let us know.  Follow-up can be as needed.  He can still try heating pad on occasion and just get this time.

## 2023-04-06 ENCOUNTER — Emergency Department (HOSPITAL_COMMUNITY)
Admission: EM | Admit: 2023-04-06 | Discharge: 2023-04-07 | Disposition: A | Discharge status: Home / Self Care | Payer: No Typology Code available for payment source | Attending: Emergency Medicine | Admitting: Emergency Medicine

## 2023-04-06 ENCOUNTER — Telehealth: Payer: Self-pay | Admitting: Internal Medicine

## 2023-04-06 ENCOUNTER — Encounter (HOSPITAL_COMMUNITY): Payer: Self-pay

## 2023-04-06 ENCOUNTER — Emergency Department (HOSPITAL_COMMUNITY): Payer: No Typology Code available for payment source

## 2023-04-06 DIAGNOSIS — I4892 Unspecified atrial flutter: Secondary | ICD-10-CM | POA: Insufficient documentation

## 2023-04-06 DIAGNOSIS — I480 Paroxysmal atrial fibrillation: Secondary | ICD-10-CM | POA: Insufficient documentation

## 2023-04-06 DIAGNOSIS — N189 Chronic kidney disease, unspecified: Secondary | ICD-10-CM | POA: Insufficient documentation

## 2023-04-06 DIAGNOSIS — I2510 Atherosclerotic heart disease of native coronary artery without angina pectoris: Secondary | ICD-10-CM | POA: Diagnosis not present

## 2023-04-06 DIAGNOSIS — Z7901 Long term (current) use of anticoagulants: Secondary | ICD-10-CM | POA: Diagnosis not present

## 2023-04-06 DIAGNOSIS — R0602 Shortness of breath: Secondary | ICD-10-CM | POA: Diagnosis present

## 2023-04-06 LAB — BASIC METABOLIC PANEL
Anion gap: 12 (ref 5–15)
BUN: 29 mg/dL — ABNORMAL HIGH (ref 8–23)
CO2: 28 mmol/L (ref 22–32)
Calcium: 9 mg/dL (ref 8.9–10.3)
Chloride: 98 mmol/L (ref 98–111)
Creatinine, Ser: 1.77 mg/dL — ABNORMAL HIGH (ref 0.61–1.24)
GFR, Estimated: 40 mL/min — ABNORMAL LOW (ref >60)
Glucose, Bld: 161 mg/dL — ABNORMAL HIGH (ref 70–99)
Potassium: 3.4 mmol/L — ABNORMAL LOW (ref 3.5–5.1)
Sodium: 138 mmol/L (ref 135–145)

## 2023-04-06 LAB — CBC
HCT: 44.1 % (ref 39.0–52.0)
Hemoglobin: 15 g/dL (ref 13.0–17.0)
MCH: 35.7 pg — ABNORMAL HIGH (ref 26.0–34.0)
MCHC: 34 g/dL (ref 30.0–36.0)
MCV: 105 fL — ABNORMAL HIGH (ref 80.0–100.0)
Platelets: 244 10*3/uL (ref 150–400)
RBC: 4.2 MIL/uL — ABNORMAL LOW (ref 4.22–5.81)
RDW: 11.8 % (ref 11.5–15.5)
WBC: 10.3 10*3/uL (ref 4.0–10.5)
nRBC: 0 % (ref 0.0–0.2)

## 2023-04-06 LAB — TROPONIN I (HIGH SENSITIVITY): Troponin I (High Sensitivity): 9 ng/L (ref <18)

## 2023-04-06 LAB — MAGNESIUM: Magnesium: 2.5 mg/dL — ABNORMAL HIGH (ref 1.7–2.4)

## 2023-04-06 MED ORDER — MAGNESIUM SULFATE 2 GM/50ML IV SOLN
2.0000 g | Freq: Once | INTRAVENOUS | Status: DC
  Administered 2023-04-06: 2 g via INTRAVENOUS
  Filled 2023-04-06: qty 50

## 2023-04-06 MED ORDER — POTASSIUM CHLORIDE CRYS ER 20 MEQ PO TBCR
20.0000 meq | EXTENDED_RELEASE_TABLET | Freq: Once | ORAL | Status: AC
  Administered 2023-04-06: 20 meq via ORAL
  Filled 2023-04-06: qty 1

## 2023-04-06 NOTE — ED Provider Notes (Signed)
[name] Provider Note   CSN: [id number] Arrival date & time: 04/06/23  2216     History {Add pertinent medical, surgical, social history, OB history to HPI:1} Chief Complaint  Patient presents with   Tachycardia   Shortness of Breath    [name] is a 77 y.o. male.   Shortness of Breath      Home Medications Prior to Admission medications   Medication Sig Start Date End Date Taking? Authorizing Provider  ALPRAZolam (ALPRAZOLAM XR) 1 MG 24 hr tablet Take 1 tablet (1 mg total) by mouth daily. 11/27/22   Dohmeier, Porfirio Mylar, MD  amLODipine (NORVASC) 5 MG tablet Take 1 tablet (5 mg total) by mouth daily. 08/20/22   Swinyer, Zachary George, NP  apixaban (ELIQUIS) 5 MG TABS tablet Take 1 tablet (5 mg total) by mouth 2 (two) times daily. 04/27/21   Tonny Bollman, MD  Ascorbic Acid (VITAMIN C) 1000 MG tablet Take 1,000 mg by mouth 2 (two) times daily.    [provider]  aspirin-acetaminophen-caffeine (EXCEDRIN MIGRAINE) [phone] MG tablet Take by mouth as needed for headache. 09/04/18   [provider]  buPROPion (WELLBUTRIN XL) 150 MG 24 hr tablet Take 150 mg by mouth daily.    [provider]  butalbital-aspirin-caffeine-codeine (FIORINAL WITH CODEINE) 50-325-40-30 MG capsule TAKE 1 CAPSULE BY MOUTH EVERY 4 HOURS AS NEEDED FOR PAIN 09/24/22   Dohmeier, Porfirio Mylar, MD  diazepam (VALIUM) 5 MG tablet Take 2.5-5 mg by mouth 2 (two) times daily as needed for anxiety.  10/05/14   [provider]  furosemide (LASIX) 20 MG tablet TAKE 1 TABLET BY MOUTH DAILY 02/13/23   Quintella Reichert, MD  ibuprofen (ADVIL,MOTRIN) 200 MG tablet Take 200-400 mg by mouth every 8 (eight) hours as needed (for arthritis in the feet).    [provider]  losartan (COZAAR) 25 MG tablet TAKE ONE TABLET BY MOUTH DAILY 11/08/22   Carlos Levering, NP  Nebivolol HCl (BYSTOLIC) 20 MG TABS Take 1 tablet (20 mg total) by mouth daily.  08/30/22   Quintella Reichert, MD  niacin 500 MG tablet Take 500 mg by mouth at bedtime.    [provider]  rosuvastatin (CRESTOR) 20 MG tablet TAKE 1 TABLET BY MOUTH DAILY 12/23/22   Quintella Reichert, MD  Specialty Vitamins Products (CENTRUM SPECIALIST ENERGY) TABS Take 1 tablet by mouth daily with breakfast.    [provider]  tadalafil (CIALIS) 5 MG tablet Take 5 mg by mouth every evening.    [provider]  Testosterone 40.5 MG/2.5GM (1.62%) GEL Place 4 Pump onto the skin daily. Apply 4 pumps to each shoulder every morning    [provider]  traZODone (DESYREL) 50 MG tablet TAKE 1 AND 1/2 TABLET BY MOUTH EVERY NIGHT AT BEDTIME 11/12/22   Butch Penny, NP  Turmeric 500 MG CAPS Take 500 mg by mouth daily.    [provider]  vitamin E 400 UNIT capsule Take 400 Units by mouth at bedtime.     [provider]      Allergies    Dog epithelium (canis lupus familiaris) and Mushroom extract complex    Review of Systems   Review of Systems  Respiratory:  Positive for shortness of breath.     Physical Exam Updated Vital Signs BP 101/72 (BP Location: Left Arm)   Pulse (!) 135   Temp 98.1 F (36.7 C) (Oral)   Resp 18  Ht 5\' 7"  (1.702 m)   Wt 63.5 kg   SpO2 100%   BMI 21.93 kg/m  Physical Exam  ED Results / Procedures / Treatments   Labs (all labs ordered are listed, but only abnormal results are displayed) Labs Reviewed  CBC - Abnormal; Notable for the following components:      Result Value   RBC 4.20 (*)    MCV 105.0 (*)    MCH 35.7 (*)    All other components within normal limits  BASIC METABOLIC PANEL  TROPONIN I (HIGH SENSITIVITY)    EKG EKG Interpretation Date/Time:  Sunday April 06 2023 22:26:51 EDT Ventricular Rate:  131 PR Interval:  147 QRS Duration:  88 QT Interval:  328 QTC Calculation: 485 R Axis:   117  Text Interpretation: Right and left arm electrode reversal, interpretation assumes no reversal  Sinus tachycardia vs flutter Ventricular premature complex Right axis deviation Probable anteroseptal infarct, old Borderline repolarization abnormality Confirmed by Tilden Fossa [phone]) on 04/06/2023 10:58:25 PM  Radiology No results found.  Procedures Procedures  {Document cardiac monitor, telemetry assessment procedure when appropriate:1}  Medications Ordered in ED Medications - No data to display  ED Course/ Medical Decision Making/ A&P   {   Click here for ABCD2, HEART and other calculatorsREFRESH Note before signing :1}                              Medical Decision Making Amount and/or Complexity of Data Reviewed Labs: ordered.   ***  {Document critical care time when appropriate:1} {Document review of labs and clinical decision tools ie heart score, Chads2Vasc2 etc:1}  {Document your independent review of radiology images, and any outside records:1} {Document your discussion with family members, caretakers, and with consultants:1} {Document social determinants of health affecting pt's care:1} {Document your decision making why or why not admission, treatments were needed:1} Final Clinical Impression(s) / ED Diagnoses Final diagnoses:  None    Rx / DC Orders ED Discharge Orders     None

## 2023-04-06 NOTE — Telephone Encounter (Signed)
Patient Phone Call   Received page regarding patient, called back. Patient stated he feels like he is in atrial fibrillation. He used his cardia device and fitbit, which both informed him he is in afib. He states the last time he was in afib was last winter. He generally feels okay, but is just concerned about the rapid heart beat. His heart rate is in the 110s-120s.  He continues to take apixaban bid and nebivolol 20mg  every day.  Plan: -Instructed patient to increase nebivolol 20mg  from every day to BID until afib resolves as he is currently not rate controlled with current dose -Patient to continue monitoring HR, if persistently over 125, instructed to come to ED -If remains in afib > 48-72 hours, instructed he may need DCCV or change to outpatient management -Strict return precautions for the ED if develops   Achille Rich, MD Cardiology

## 2023-04-06 NOTE — ED Triage Notes (Addendum)
Pt arrived POV for SOB and feels that he was in Afib as he felt his heart was racing. Pt reports numbness sensation across the top of his shoulders and down bilateral arms. Pt denies any CP or other pain, no n/v. Reports has been cardioverted in the past. HR 132 ST on monitor at current. A&O x4,   Pt took a 20mg  Nebivolol advised by his cardiologist

## 2023-04-07 MED ORDER — ETOMIDATE 2 MG/ML IV SOLN
10.0000 mg | Freq: Once | INTRAVENOUS | Status: DC
  Filled 2023-04-07: qty 10

## 2023-04-07 MED ORDER — SODIUM CHLORIDE 0.9 % IV BOLUS
500.0000 mL | Freq: Once | INTRAVENOUS | Status: AC
  Administered 2023-04-07: 500 mL via INTRAVENOUS

## 2023-04-07 MED ORDER — METOPROLOL TARTRATE 5 MG/5ML IV SOLN
2.5000 mg | Freq: Once | INTRAVENOUS | Status: AC
  Administered 2023-04-07: 2.5 mg via INTRAVENOUS
  Filled 2023-04-07: qty 5

## 2023-04-07 NOTE — ED Notes (Signed)
Pt was able to self convert from A-fib to Sinus. Pt denies any chest pain at this time, RR even and unlabored.

## 2023-04-07 NOTE — Telephone Encounter (Signed)
Followed up with pt. Pt aware Dr. Elberta Fortis advises he be seen in afib clinic.    Pt is currently scheduled to see Renee next month, 10/25.   He would prefer afib clinic. Aware I will forward this note to their office to call him and arrange  OV w/ them.  He cannot do this week/next.  He prefers the week of 9/30 - 10/4.  Aware they will see what is available. (Will not cancel Francis Dowse visit until pt see afib clinic).

## 2023-04-07 NOTE — Discharge Instructions (Addendum)
Increase your nebivolol to twice daily.

## 2023-04-10 ENCOUNTER — Ambulatory Visit (HOSPITAL_COMMUNITY)
Admission: RE | Admit: 2023-04-10 | Discharge: 2023-04-10 | Disposition: A | Discharge status: Home / Self Care | Payer: Medicare Other | Source: Ambulatory Visit | Attending: Physician Assistant | Admitting: Physician Assistant

## 2023-04-10 ENCOUNTER — Encounter (HOSPITAL_COMMUNITY): Payer: Self-pay | Admitting: Physician Assistant

## 2023-04-10 VITALS — BP 138/72 | HR 54 | Ht 67.0 in | Wt 137.0 lb

## 2023-04-10 DIAGNOSIS — D6869 Other thrombophilia: Secondary | ICD-10-CM | POA: Diagnosis not present

## 2023-04-10 DIAGNOSIS — I2510 Atherosclerotic heart disease of native coronary artery without angina pectoris: Secondary | ICD-10-CM | POA: Diagnosis present

## 2023-04-10 DIAGNOSIS — I4892 Unspecified atrial flutter: Secondary | ICD-10-CM | POA: Insufficient documentation

## 2023-04-10 DIAGNOSIS — Z951 Presence of aortocoronary bypass graft: Secondary | ICD-10-CM | POA: Diagnosis present

## 2023-04-10 DIAGNOSIS — I4819 Other persistent atrial fibrillation: Secondary | ICD-10-CM | POA: Diagnosis not present

## 2023-04-10 DIAGNOSIS — R9431 Abnormal electrocardiogram [ECG] [EKG]: Secondary | ICD-10-CM | POA: Insufficient documentation

## 2023-04-10 DIAGNOSIS — G4733 Obstructive sleep apnea (adult) (pediatric): Secondary | ICD-10-CM | POA: Diagnosis not present

## 2023-04-10 DIAGNOSIS — N183 Chronic kidney disease, stage 3 unspecified: Secondary | ICD-10-CM | POA: Diagnosis not present

## 2023-04-10 DIAGNOSIS — I130 Hypertensive heart and chronic kidney disease with heart failure and stage 1 through stage 4 chronic kidney disease, or unspecified chronic kidney disease: Secondary | ICD-10-CM | POA: Diagnosis not present

## 2023-04-10 DIAGNOSIS — I5032 Chronic diastolic (congestive) heart failure: Secondary | ICD-10-CM | POA: Insufficient documentation

## 2023-04-10 MED ORDER — NEBIVOLOL HCL 20 MG PO TABS: 20.0000 mg | ORAL_TABLET | Freq: Two times a day (BID) | ORAL | 6 refills | Status: DC

## 2023-04-10 NOTE — Progress Notes (Signed)
Primary Care Physician: Zoila Shutter, MD Primary Cardiologist: Dr Mayford Knife Primary Electrophysiologist: Dr Elberta Fortis Referring Physician: Dr Cyril Mourning is a 77 y.o. male with a history of CAD with CABG in 1998, bicuspid AV with moderate to severe AI and dilated aortic root followed by Dr. Nevada Crane at [facility], dyslipidemia, HTN, OSA, atrial flutter who presents for follow up in the [facility].  The patient was initially diagnosed with atrial flutter after presented to [facility] ER with symptoms of palpitations. He underwent successful DCCV. He is on Eliquis for a CHADS2VASC score of 4. Patient saw Dr Mayford Knife in follow up on 06/22/19 and reported that he was having increased palpitations. A 30 day event monitor was placed. Which did show an episode of atrial flutter with HR 120. Patient reports that he did have symptoms of palpitations for about 1 hour which correspond to the event. He denies any significant alcohol use. There were no specific triggers that he could identify. He is s/p atrial flutter ablation on 09/29/19 with Dr Elberta Fortis.  He was seen as an add on to Dr Earmon Phoenix schedule on 04/27/21 and was in new onset afib. He had felt some palpitations and his home monitor showed an irregular heart beat. He was started on Eliquis and his BB was increased. Patient is s/p DCCV on 05/21/21 but had quick return of afib. He was seen by Dr Elberta Fortis, reloaded on amiodarone and had DCCV on 07/12/21. He did have issues with fluid overload post DCCV and was diuresed.   Patient is s/p afib ablation with Dr Elberta Fortis 08/02/21.   On follow up today, patient was seen at the ED 10/10/22 in afib. He converted to SR after being given IVF. He was seen at the ED again 9/15 but spontaneously converted prior to DCCV. His nebivolol was increased at that time. He has done well since then. No bleeding issues on anticoagulation.   Today, he denies symptoms of palpitations, chest pain,  shortness of breath, orthopnea, PND, lower extremity edema, dizziness, presyncope, syncope, bleeding, or neurologic sequela. The patient is tolerating medications without difficulties and is otherwise without complaint today.    Atrial Fibrillation Risk Factors:  he does have symptoms or diagnosis of sleep apnea. he is compliant with CPAP therapy. he does not have a history of rheumatic fever. he does not have a history of alcohol use. The patient does not have a history of early familial atrial fibrillation or other arrhythmias.   Atrial Fibrillation Management history:  Previous antiarrhythmic drugs: amiodarone Previous cardioversions: 04/2019 [facility]), 05/21/21, 07/12/21 Previous ablations: 09/29/19, 08/02/21 Anticoagulation history: Eliquis   Past Medical History:  Diagnosis Date   Anal fissure    Aortic insufficiency    Arthritis    Atrial flutter (HCC)    Bicuspid aortic valve    with mild to moderate AR by echo 08/2019   BPH (benign prostatic hyperplasia)    CAD (coronary artery disease)    s/p CABG [employer] [place] 1998   Cataract    bilateral-removed   CKD (chronic kidney disease), stage III (HCC)    Depression    Dilated aortic root (HCC)    History of blood transfusion 1998   "due to cardiac bypass surgery"   Hyperlipidemia    Hypertension    Hypogonadism in male    Kidney stones    Migraine    Mild carotid artery disease (HCC) 2009   Myocardial infarction (HCC)  PVC's (premature ventricular contractions)    Retinal hemorrhage 11/04/2014   Sleep apnea with use of continuous positive airway pressure (CPAP)    Thoracic aortic aneurysm (TAA) (HCC)    51mm aoartic root and 47mm ascending aorta by echo 08/2022     Current Outpatient Medications  Medication Sig Dispense Refill   ALPRAZolam (ALPRAZOLAM XR) 1 MG 24 hr tablet Take 1 tablet (1 mg total) by mouth daily. 90 tablet 1   amLODipine (NORVASC) 5 MG tablet Take 1 tablet (5 mg total) by mouth daily. 90  tablet 2   apixaban (ELIQUIS) 5 MG TABS tablet Take 1 tablet (5 mg total) by mouth 2 (two) times daily. 60 tablet 11   Ascorbic Acid (VITAMIN C) 1000 MG tablet Take 1,000 mg by mouth 2 (two) times daily.     aspirin-acetaminophen-caffeine (EXCEDRIN MIGRAINE) 250-250-65 MG tablet Take by mouth as needed for headache.     buPROPion (WELLBUTRIN XL) 150 MG 24 hr tablet Take 150 mg by mouth daily.     butalbital-aspirin-caffeine-codeine (FIORINAL WITH CODEINE) 50-325-40-30 MG capsule TAKE 1 CAPSULE BY MOUTH EVERY 4 HOURS AS NEEDED FOR PAIN 30 capsule 5   diazepam (VALIUM) 5 MG tablet Take 2.5-5 mg by mouth 2 (two) times daily as needed for anxiety.      furosemide (LASIX) 20 MG tablet TAKE 1 TABLET BY MOUTH DAILY 90 tablet 2   ibuprofen (ADVIL,MOTRIN) 200 MG tablet Take 200-400 mg by mouth every 8 (eight) hours as needed (for arthritis in the feet).     losartan (COZAAR) 25 MG tablet TAKE ONE TABLET BY MOUTH DAILY 90 tablet 2   Nebivolol HCl (BYSTOLIC) 20 MG TABS Take 1 tablet (20 mg total) by mouth daily. (Patient taking differently: Take 20 mg by mouth in the morning and at bedtime.) 90 tablet 2   niacin 500 MG tablet Take 500 mg by mouth at bedtime.     rosuvastatin (CRESTOR) 20 MG tablet TAKE 1 TABLET BY MOUTH DAILY 90 tablet 3   Specialty Vitamins Products (CENTRUM SPECIALIST ENERGY) TABS Take 1 tablet by mouth daily with breakfast.     tadalafil (CIALIS) 5 MG tablet Take 5 mg by mouth every evening.     Testosterone 40.5 MG/2.5GM (1.62%) GEL Place 4 Pump onto the skin daily. Apply 4 pumps to each shoulder every morning     traZODone (DESYREL) 50 MG tablet TAKE 1 AND 1/2 TABLET BY MOUTH EVERY NIGHT AT BEDTIME 135 tablet 1   Turmeric 500 MG CAPS Take 500 mg by mouth daily.     vitamin E 400 UNIT capsule Take 400 Units by mouth at bedtime.      No current facility-administered medications for this encounter.    ROS- All systems are reviewed and negative except as per the HPI above.  Physical  Exam: Vitals:   04/10/23 0849  BP: 138/72  Pulse: (!) 54  Weight: 62.1 kg  Height: 5\' 7"  (1.702 m)    GEN: Well nourished, well developed in no acute distress NECK: No JVD; No carotid bruits CARDIAC: Regular rate and rhythm with occasional ectopy, no murmurs, rubs, gallops RESPIRATORY:  Clear to auscultation without rales, wheezing or rhonchi  ABDOMEN: Soft, non-tender, non-distended EXTREMITIES:  No edema; No deformity    Wt Readings from Last 3 Encounters:  04/10/23 62.1 kg  04/06/23 63.5 kg  03/18/23 64.9 kg    EKG today demonstrates  SB, 1st degree AV block, PVC Vent. rate 54 BPM PR interval 202 ms QRS  duration 82 ms QT/QTcB 448/424 ms  Echo 08/28/20 demonstrated  1. Aorta measurements are stable compared with prior study 09/07/2019. Aortic dilatation noted. Aneurysm of the aortic root, measuring 51 mm. 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   2. Tricuspid aortic valve (clearly tricuspid on TEE 2016). Mild to  moderate eccentric AI related to incomplete leaflet closure from aortic  root aneurysm. The aortic valve is tricuspid. Aortic valve regurgitation  is mild to moderate. No aortic stenosis is present.   3. Left ventricular ejection fraction, by estimation, is 60 to 65%. The  left ventricle has normal function. The left ventricle has no regional  wall motion abnormalities. There is mild concentric left ventricular  hypertrophy. Left ventricular diastolic parameters are indeterminate.   4. Right ventricular systolic function is normal. The right ventricular  size is normal. There is normal pulmonary artery systolic pressure. The estimated right ventricular systolic pressure is 32.2 mmHg.   5. The mitral valve is grossly normal. No evidence of mitral valve  regurgitation. No evidence of mitral stenosis.   6. The inferior vena cava is normal in size with greater than 50%  respiratory variability, suggesting right atrial pressure of 3 mmHg.   Comparison(s):  No significant change from prior study.   Epic records are reviewed at length today  CHA2DS2-VASc Score = 4  The patient's score is based upon: CHF History: 1 HTN History: 1 Diabetes History: 0 Stroke History: 0 Vascular Disease History: 1 Age Score: 1 Gender Score: 0       ASSESSMENT AND PLAN: Persistent Atrial Fibrillation/atrial flutter The patient's CHA2DS2-VASc score is 4, indicating a 4.8% annual risk of stroke.   S/p flutter ablation 09/29/19 and afib ablation 08/02/21 We discussed rhythm control options including AAD (Multaq, dofetilide, amiodarone) and repeat ablation. Patient agreeable to consultation with Dr Elberta Fortis to discuss another ablation.   Continue Eliquis 5 mg BID  Continue nebivolol 20 mg BID  Secondary Hypercoagulable State (ICD10:  D68.69) The patient is at significant risk for stroke/thromboembolism based upon his CHA2DS2-VASc Score of 4.  Continue Apixaban (Eliquis).   OSA  Encouraged nightly CPAP  HTN Stable on current regimen  CAD No anginal symptoms  Chronic diastolic CHF Fluid status appears stable    Follow up with Dr Elberta Fortis to discuss possible repeat ablation.    Jorja Loa PA-C [facility] [address] [place], [place] [address] [phone] 04/10/2023 9:02 AM

## 2023-04-14 ENCOUNTER — Ambulatory Visit: Payer: Medicare Other | Admitting: Cardiology

## 2023-04-30 ENCOUNTER — Telehealth: Payer: Self-pay | Admitting: Neurology

## 2023-04-30 NOTE — Telephone Encounter (Signed)
I received your letter:   I am planning to copy the following to a [employer] Letter[employer] for a nexus letter on your behalf: I will need your exact service dates and any deployment dates and your rank in service. You should have a VA service membership number ?    To the [employer] on behalf of [name], born August 09, 1945.  I have first encounter with [name] in a consultation visit in our sleep clinic on 10-17 2017 when he was referred by [facility]- Ovid Curd, MD,  with a chief concern of snoring,chronic migraine, at risk for sleep apnea. The patient presented with a history of heart disease and chronic migraines had undergone a cardiac bypass surgery in 1998 before age 23.  He has had longstanding headaches and there was a question if his headaches were associated with his sleep disorder.  He has an hour or take abdominal aneurysm, he had a history of arthritis and aortic valve regurgitation.  He often woke up with headaches at the time.  Originally diagnosed with migraine while in college these headaches were described as intense 10 out of 10 at times sometimes associated with a visual aura, almost always associated with nausea, and occasional leading to vomiting.  Photophobia, phonophobia but present and status migrainosus has occurred with headaches lasting up to 3 days. He had been treated with  2 narcotics such as toileting, micrograms water, Percodan, Percocet and Stadol nasal spray.  Would have up to 15 migraines with almost every semester being admitted to hospital , affecting his social and professional life. A significant reduction occurred in migraine headaches after bypass surgery in 1998 the frequency declined but not the intensity.  Then a beta-blocker have been prescribed and seem to reduce the frequency.  He was switched to Fiorinal and has been happy with the medication to the day of his referral.  He has kept a daily record of his headaches when he saw  me and took Excedrin and relatively rarely Fiorinal.  He stated that he had never overdosed the medication and never requested early refills.  As to his sleep disorder he has nocturia up to 5 times which decreased after starting on Cialis 5 mg daily use and now had 1 or 2 bathroom breaks nightly use trazodone as a sleep inducer but without much success.  He usually woke up after only 4- 5 hours of sleep and often could not return to sleep.  I ordered and interpreted his sleep study on November 5th, 2017. This was a baseline polysomnography during  which the patient never entered REM sleep. He showed an AHI of 39.8/h  qualifying for a dx of severe sleep apnea.  This was no simple obstructive sleep apnea is a 110 respiratory events of which 44 obstructive apneas, 25 were central and 14 mixed apneas there were only 27 additional hypopneas.  The PSG did not show hypoxia to explain sleep related headaches.  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   He started on Zonisamide and was invited to return for PAP titration.  He remained on fiorinal.   The titration study  eliminated sleep apnea , this time we saw PVCs and less frequent limb movements under 7 cm water pressure. This was the pressure prescribed for home therapy.  He did very well with this setting and Simplus FFmask and had a high compliance. He noted sleeping better, longer and less frequent headaches.  Insomnia  has much improved as has his sleep quality, due to the complex sleep apnea , he is not a candidate for hypoglossal nerve stimulation due to the central and mixed apneas being present.   .     [name] is a meanwhile 77 year old Caucasian gentleman whom I have followed now for about 7 years.  [name] reports that his headache has been much better under control he is actually very happy about this aspect of his neurological health.   He has lost some weight since December 2021 and his BMI is  now 22.4.  He is using zonisamide as a headache preventative and for acute breakthrough headaches he will use the occasional Fiorinal with butalbutal.    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 He used to serve in the [employer].  He certainly was exposed to very loud noises.  His sleep has always changed since after that. His headaches disorder became  more severe.   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 There had been various opiates prescribed, time from of 37 years  to several years ago , but currently he is only on Fiorinal as needed and a prescription lasts him for over 90 days.   He has no history of opiate addiction.  This low frequency use should be allowed.   In summary: My  patient has military service related migraines, service related hearing loss and tinnitus, and via migraine therapy was exposed to decades of narcotic prescription use.   In my opinion,  this patient's complex sleep apnea is more likely than not related to his [employer] service and continued through his adult life after discharge.   The central apnea component is especially more likely than not related to the patient's long-term opiate use.    Sincerely ,  Melvyn Novas, MD  Board certified in sleep medicine through the American Board of sleep medicine and senior member of the American Academy of sleep medicine AASM.   [facility] is an Building services engineer through the [employer] of sleep medicine- AASM.   Board certified in Neurology  through the [employer] of psychiatry and neurology, ABPN. Member of the [employer] of Neurology.

## 2023-05-01 ENCOUNTER — Encounter: Payer: Self-pay | Admitting: Neurology

## 2023-05-05 ENCOUNTER — Encounter: Payer: Self-pay | Admitting: Neurology

## 2023-05-05 ENCOUNTER — Other Ambulatory Visit: Payer: Self-pay | Admitting: Adult Health

## 2023-05-06 ENCOUNTER — Ambulatory Visit: Payer: Medicare Other | Admitting: Gastroenterology

## 2023-05-15 ENCOUNTER — Other Ambulatory Visit: Payer: Self-pay

## 2023-05-15 MED ORDER — AMLODIPINE BESYLATE 5 MG PO TABS: 5.0000 mg | ORAL_TABLET | Freq: Every day | ORAL | 1 refills | Status: DC

## 2023-05-16 ENCOUNTER — Ambulatory Visit: Payer: Medicare Other | Admitting: Physician Assistant

## 2023-05-28 ENCOUNTER — Other Ambulatory Visit: Payer: Self-pay | Admitting: Cardiology

## 2023-06-18 NOTE — Progress Notes (Signed)
PATIENT: [name] DOB: [birth year]  REASON FOR VISIT: follow up HISTORY FROM: patient PRIMARY NEUROLOGIST: Dr. Vickey Huger  Chief Complaint  Patient presents with   Follow-up    Rm 19, alone.   No concerns, doing well at this time.      HISTORY OF PRESENT ILLNESS: Today 06/25/23:  [name] is a 77 y.o. male with a history of OSA on CPAP and insomnia. Returns today for follow-up. Reports that CPAP is working well. No issues.  Continues to use trazodone and Xanax at bedtime for insomnia.  He has been on this combination for years and it continues to work well.  He reports that his headaches are rare.  Fioricet continues to work well when he does get a headache.        05/29/22: [name] is a 77 year old male with a history of migraine, insomnia and obstructive sleep apnea on CPAP.  He returns today for follow-up.  Migraines: Controlled. Using Fioricet with codeine 4 times a month.   Insomnia: xanax nightly- reports that he has never used xanax during the day.  Continues to take trazodone 75 mg at bedtime.  He does feel that his sleep has improved he is thinking about reducing his dose to 50 mg  OSA on CPAP: Download below.  Good compliance.    07/09/21: [name] is a 77 year old male with a history of migraines, insomnia, obstructive sleep apnea on CPAP.  He returns today for follow-up.  Migraines: Controlled. Using Fioricet with codeine  6-8 times a month.   Insomnia: xanax nightly, trazodone 50 mg helping but feels like an increased dose would be helpful.   OSA on CPAP: Download below. Sometimes mask will leak but he adjust it.   Patient has A.fib. has several tests coming up: cardioversion scheduled for 12/22, Cardiac scan on 1/4 and ablation 1/12 and Echo 2/7.    REVIEW OF SYSTEMS: Out of a complete 14 system review of symptoms, the patient complains only of the following symptoms, and all other reviewed systems are  negative.   ALLERGIES: Allergies  Allergen Reactions   Dog Epithelium (Canis Lupus Familiaris) Itching, Anxiety, Palpitations, Other (See Comments), Cough and Shortness Of Breath    Other reaction(s): Respiratory Distress (ALLERGY/intolerance), Wheezing (ALLERGY/intolerance)   Mushroom Extract Complex Nausea Only and Other (See Comments)    Severe vertigo and headaches    HOME MEDICATIONS: Outpatient Medications Prior to Visit  Medication Sig Dispense Refill   ALPRAZolam (ALPRAZOLAM XR) 1 MG 24 hr tablet Take 1 tablet (1 mg total) by mouth daily. 90 tablet 1   amLODipine (NORVASC) 5 MG tablet Take 1 tablet (5 mg total) by mouth daily. 90 tablet 1   apixaban (ELIQUIS) 5 MG TABS tablet Take 1 tablet (5 mg total) by mouth 2 (two) times daily. 60 tablet 11   Ascorbic Acid (VITAMIN C) 1000 MG tablet Take 1,000 mg by mouth 2 (two) times daily.     aspirin-acetaminophen-caffeine (EXCEDRIN MIGRAINE) 250-250-65 MG tablet Take by mouth as needed for headache.     buPROPion (WELLBUTRIN XL) 150 MG 24 hr tablet Take 150 mg by mouth daily.     butalbital-aspirin-caffeine-codeine (FIORINAL WITH CODEINE) 50-325-40-30 MG capsule TAKE 1 CAPSULE BY MOUTH EVERY 4 HOURS AS NEEDED FOR PAIN 30 capsule 5   diazepam (VALIUM) 5 MG tablet Take 2.5-5 mg by mouth 2 (two) times daily as needed for anxiety.      furosemide (LASIX) 20 MG tablet TAKE  1 TABLET BY MOUTH DAILY 90 tablet 2   ibuprofen (ADVIL,MOTRIN) 200 MG tablet Take 200-400 mg by mouth every 8 (eight) hours as needed (for arthritis in the feet).     losartan (COZAAR) 25 MG tablet TAKE ONE TABLET BY MOUTH DAILY 90 tablet 2   Nebivolol HCl 20 MG TABS TAKE 1 TABLET BY MOUTH DAILY 90 tablet 2   niacin 500 MG tablet Take 500 mg by mouth at bedtime.     rosuvastatin (CRESTOR) 20 MG tablet TAKE 1 TABLET BY MOUTH DAILY 90 tablet 3   Specialty Vitamins Products (CENTRUM SPECIALIST ENERGY) TABS Take 1 tablet by mouth daily with breakfast.     tadalafil (CIALIS) 5  MG tablet Take 5 mg by mouth every evening.     Testosterone 40.5 MG/2.5GM (1.62%) GEL Place 4 Pump onto the skin daily. Apply 4 pumps to each shoulder every morning     traZODone (DESYREL) 50 MG tablet TAKE 1 AND 1/2 TABLET BY MOUTH EVERY NIGHT AT BEDTIME 135 tablet 1   Turmeric 500 MG CAPS Take 500 mg by mouth daily.     vitamin E 400 UNIT capsule Take 400 Units by mouth at bedtime.      No facility-administered medications prior to visit.    PAST MEDICAL HISTORY: Past Medical History:  Diagnosis Date   Anal fissure    Aortic insufficiency    Arthritis    Atrial flutter (HCC)    Bicuspid aortic valve    with mild to moderate AR by echo 08/2019   BPH (benign prostatic hyperplasia)    CAD (coronary artery disease)    s/p CABG [place] 1998   Cataract    bilateral-removed   CKD (chronic kidney disease), stage III (HCC)    Depression    Dilated aortic root (HCC)    History of blood transfusion 1998   "due to cardiac bypass surgery"   Hyperlipidemia    Hypertension    Hypogonadism in male    Kidney stones    Migraine    Mild carotid artery disease (HCC) 2009   Myocardial infarction (HCC)    PVC's (premature ventricular contractions)    Retinal hemorrhage 11/04/2014   Sleep apnea with use of continuous positive airway pressure (CPAP)    Thoracic aortic aneurysm (TAA) (HCC)    51mm aoartic root and 47mm ascending aorta by echo 08/2022    PAST SURGICAL HISTORY: Past Surgical History:  Procedure Laterality Date   A-FLUTTER ABLATION N/A 09/29/2019   Procedure: A-FLUTTER ABLATION;  Surgeon: Regan Lemming, MD;  Location: [facility];  Service: Cardiovascular;  Laterality: N/A;   ATRIAL FIBRILLATION ABLATION N/A 08/02/2021   Procedure: ATRIAL FIBRILLATION ABLATION;  Surgeon: Regan Lemming, MD;  Location: [facility];  Service: Cardiovascular;  Laterality: N/A;   CARDIOVERSION N/A 08/10/2019   Procedure: CARDIOVERSION;  Surgeon: Parke Poisson, MD;  Location: [facility] ENDOSCOPY;  Service: Cardiovascular;  Laterality: N/A;   CARDIOVERSION N/A 05/21/2021   Procedure: CARDIOVERSION;  Surgeon: Wendall Stade, MD;  Location: [facility] ENDOSCOPY;  Service: Cardiovascular;  Laterality: N/A;   CARDIOVERSION N/A 07/12/2021   Procedure: CARDIOVERSION;  Surgeon: Jake Bathe, MD;  Location: [facility] ENDOSCOPY;  Service: Cardiovascular;  Laterality: N/A;   cataracts     COLONOSCOPY     CORONARY ARTERY BYPASS GRAFT  1998   LEFT HEART CATH AND CORONARY ANGIOGRAPHY N/A 08/24/2018   Procedure: LEFT HEART CATH AND CORONARY ANGIOGRAPHY;  Surgeon: Runell Gess,  MD;  Location: [facility];  Service: Cardiovascular;  Laterality: N/A;   TEE WITHOUT CARDIOVERSION N/A 01/19/2015   Procedure: TRANSESOPHAGEAL ECHOCARDIOGRAM (TEE);  Surgeon: Quintella Reichert, MD;  Location: [facility] ENDOSCOPY;  Service: Cardiovascular;  Laterality: N/A;   TOOTH EXTRACTION     VARICOSE VEIN SURGERY  1984   pt denies    FAMILY HISTORY: Family History  Problem Relation Age of Onset   Arthritis Mother        deceased   Hyperlipidemia Mother    Diabetes Mother    Arthritis Father    Hyperlipidemia Father    Heart disease Father 50   Stroke Father 61       deceased   Hypertension Father    Diabetes Father    Kidney disease Paternal Grandfather    Colon cancer Neg Hx    Colon polyps Neg Hx    Esophageal cancer Neg Hx    Stomach cancer Neg Hx    Rectal cancer Neg Hx    Sleep apnea Neg Hx     SOCIAL HISTORY: Social History   Socioeconomic History   Marital status: Divorced    Spouse name: Not on file   Number of children: 0   Years of education: Not on file   Highest education level: Not on file  Occupational History   Occupation: retired  Tobacco Use   Smoking status: Former    Current packs/day: 0.00    Types: Cigarettes    Start date: 07/22/1966    Quit date: 07/22/1986    Years since quitting: 36.9   Smokeless tobacco: Never   Tobacco comments:    Former  smoker 05/09/2021  Vaping Use   Vaping status: Never Used  Substance and Sexual Activity   Alcohol use: Yes    Alcohol/week: 1.0 standard drink of alcohol    Types: 1 Glasses of wine per week    Comment: 0-1 daily   Drug use: No   Sexual activity: Yes  Other Topics Concern   Not on file  Social History Narrative   Divorced   Publishing rights manager- retired   No children   Has a Medical laboratory scientific officer named Kirt Boys   Enjoys outdoor activities- hiking, swimming, Media planner, baseball, writing, reading, cooking   Social Determinants of Health   Financial Resource Strain: Medium Risk (04/07/2021)   [facility] prior to 09/21/2022., [facility] visits prior to 09/21/2022.   Overall Financial Resource Strain (CARDIA)    Difficulty of Paying Living Expenses: Somewhat hard  Food Insecurity: Low Risk  (02/19/2023)   [facility] from [facility]   Hunger Vital Sign    Worried About Running Out of Food in the Last Year: Never true    Ran Out of Food in the Last Year: Never true  Transportation Needs: Not on file (02/19/2023)  Physical Activity: Sufficiently Active (04/07/2021)   [facility] visits prior to 09/21/2022., [facility] visits prior to 09/21/2022.   Exercise Vital Sign    Days of Exercise per Week: 5 days    Minutes of Exercise per Session: 70 min  Stress: Stress Concern Present (04/07/2021)   [facility] visits prior to 09/21/2022., [facility] visits prior to 09/21/2022.   [employer] of Occupational Health - Occupational Stress Questionnaire    Feeling of Stress : Rather much  Social Connections: Moderately Integrated (04/07/2021)  [facility] visits prior to 09/21/2022., [facility] visits prior to 09/21/2022.   Social Advertising account executive [NHANES]    Frequency of  Communication with Friends and Family: Twice a week    Frequency of Social Gatherings with Friends and Family: Twice a week    Attends Religious Services: 1 to 4 times per year    Active Member of [employer] or Organizations: Yes    Attends Banker Meetings: 1 to 4 times per year    Marital Status: Divorced  Intimate Partner Violence: Not At Risk (04/07/2021)   [facility] visits prior to 09/21/2022., [facility] visits prior to 09/21/2022.   Humiliation, Afraid, Rape, and Kick questionnaire    Fear of Current or Ex-Partner: No    Emotionally Abused: No    Physically Abused: No    Sexually Abused: No      PHYSICAL EXAM  Vitals:   06/25/23 1033  Weight: 147 lb (66.7 kg)  Height: 5\' 7"  (1.702 m)   Body mass index is 23.02 kg/m.  Generalized: Well developed, in no acute distress    Neurological examination  Mentation: Alert oriented to time, place, history taking. Follows all commands speech and language fluent Cranial nerve II-XII: Extraocular movements were full, visual field were full on confrontational test Head turning and shoulder shrug  were normal and symmetric. Motor: The motor testing reveals 5 over 5 strength of all 4 extremities. Good symmetric motor tone is noted throughout.  Sensory: Sensory testing is intact to soft touch on all 4 extremities. No evidence of extinction is noted.  Gait and station: Gait is normal.    DIAGNOSTIC DATA (LABS, IMAGING, TESTING) - I reviewed patient records, labs, notes, testing and imaging myself where available.  Lab Results  Component Value Date   WBC 10.3 04/06/2023   HGB 15.0 04/06/2023   HCT 44.1 04/06/2023   MCV 105.0 (H) 04/06/2023   PLT 244 04/06/2023      Component Value Date/Time   NA 138 04/06/2023 2229   NA 138 09/07/2021 0841   K 3.4 (L) 04/06/2023 2229   CL 98 04/06/2023 2229   CO2 28 04/06/2023 2229   GLUCOSE 161 (H) 04/06/2023 2229   BUN 29 (H)  04/06/2023 2229   BUN 25 09/07/2021 0841   CREATININE 1.77 (H) 04/06/2023 2229   CALCIUM 9.0 04/06/2023 2229   PROT 6.1 09/30/2022 0818   ALBUMIN 4.2 09/30/2022 0818   AST 32 09/30/2022 0818   ALT 18 09/30/2022 0818   ALKPHOS 113 09/30/2022 0818   BILITOT 0.3 09/30/2022 0818   GFRNONAA 40 (L) 04/06/2023 2229   GFRAA 64 07/27/2020 0954   Lab Results  Component Value Date   CHOL 95 (L) 09/30/2022   HDL 35 (L) 09/30/2022   LDLCALC 38 09/30/2022   TRIG 124 09/30/2022   CHOLHDL 2.7 09/30/2022   Lab Results  Component Value Date   HGBA1C 5.9 (H) 08/22/2018   Lab Results  Component Value Date   VITAMINB12 721 09/24/2018   Lab Results  Component Value Date   TSH 2.760 04/27/2021      ASSESSMENT AND PLAN 77 y.o. year old male  has a past medical history of Anal fissure, Aortic insufficiency, Arthritis, Atrial flutter (HCC), Bicuspid aortic valve, BPH (benign prostatic hyperplasia), CAD (coronary artery disease), Cataract, CKD (chronic kidney disease), stage III (HCC), Depression, Dilated aortic root (HCC), History of  blood transfusion (1998), Hyperlipidemia, Hypertension, Hypogonadism in male, Kidney stones, Migraine, Mild carotid artery disease (HCC) (2009), Myocardial infarction (HCC), PVC's (premature ventricular contractions), Retinal hemorrhage (11/04/2014), Sleep apnea with use of continuous positive airway pressure (CPAP), and Thoracic aortic aneurysm (TAA) (HCC). here with:  OSA on CPAP  - CPAP compliance excellent - Good treatment of AHI  - Encourage patient to use CPAP nightly and > 4 hours each night   2.  Migraine headaches  -Controlled -Continue Fioricet with codeine- cautioned about rebound headaches  3.  Insomnia  -Continue Xanax XR 1 mg at bedtime -Continue trazodone  75 mg at bedtime  FU 6 months with Dr. Vergia Alcon, MSN, NP-C 06/25/2023, 10:38 AM [facility] will give [address] [place], [place]  [address] [phone]

## 2023-06-25 ENCOUNTER — Ambulatory Visit (INDEPENDENT_AMBULATORY_CARE_PROVIDER_SITE_OTHER): Payer: Medicare Other | Admitting: Adult Health

## 2023-06-25 ENCOUNTER — Encounter: Payer: Self-pay | Admitting: Adult Health

## 2023-06-25 VITALS — BP 127/57 | HR 54 | Ht 67.0 in | Wt 147.0 lb

## 2023-06-25 DIAGNOSIS — G4733 Obstructive sleep apnea (adult) (pediatric): Secondary | ICD-10-CM

## 2023-06-25 DIAGNOSIS — F5104 Psychophysiologic insomnia: Secondary | ICD-10-CM | POA: Diagnosis not present

## 2023-06-25 DIAGNOSIS — G43E01 Chronic migraine with aura, not intractable, with status migrainosus: Secondary | ICD-10-CM

## 2023-07-01 ENCOUNTER — Ambulatory Visit: Payer: Medicare Other | Admitting: Nurse Practitioner

## 2023-07-06 ENCOUNTER — Other Ambulatory Visit: Payer: Self-pay | Admitting: Neurology

## 2023-07-08 NOTE — Telephone Encounter (Signed)
Pt checking status of this refill. States [facility] [id number] has not heard back from [place] to refill  ALPRAZolam (ALPRAZOLAM XR) 1 MG 24 hr tablet

## 2023-07-09 ENCOUNTER — Other Ambulatory Visit: Payer: Self-pay | Admitting: Neurology

## 2023-07-09 NOTE — Telephone Encounter (Signed)
Pt states he has ran out of this medication completely and is upset. Advised the request will be sent to MD per nurse. Requesting refill of ALPRAZolam (ALPRAZOLAM XR) 1 MG 24 hr tablet Send to [facility] [id number]

## 2023-07-21 ENCOUNTER — Other Ambulatory Visit: Payer: Self-pay | Admitting: Neurology

## 2023-07-31 ENCOUNTER — Encounter: Payer: Self-pay | Admitting: Cardiology

## 2023-07-31 ENCOUNTER — Ambulatory Visit: Payer: Medicare Other | Attending: Cardiology | Admitting: Cardiology

## 2023-07-31 VITALS — BP 122/70 | HR 47 | Ht 67.0 in | Wt 142.4 lb

## 2023-07-31 DIAGNOSIS — I483 Typical atrial flutter: Secondary | ICD-10-CM | POA: Insufficient documentation

## 2023-07-31 DIAGNOSIS — I491 Atrial premature depolarization: Secondary | ICD-10-CM | POA: Insufficient documentation

## 2023-07-31 DIAGNOSIS — D6869 Other thrombophilia: Secondary | ICD-10-CM | POA: Insufficient documentation

## 2023-07-31 DIAGNOSIS — I4819 Other persistent atrial fibrillation: Secondary | ICD-10-CM | POA: Insufficient documentation

## 2023-07-31 DIAGNOSIS — I493 Ventricular premature depolarization: Secondary | ICD-10-CM | POA: Insufficient documentation

## 2023-07-31 NOTE — Progress Notes (Signed)
  Electrophysiology Office Note:   Date:  07/31/2023  ID:  [name], DOB 11-25-1945, MRN [id number]  Primary Cardiologist: Drew Bihari, MD Primary Heart Failure: None Electrophysiologist: [name] Drew Norton, MD      History of Present Illness:   [name] is a 78 y.o. male with h/o coronary artery disease post CABG, bicuspid aortic valve, dilated aortic root, hypertension, hyperlipidemia, sleep apnea, atrial flutter/fibrillation seen today for routine electrophysiology followup.   Since last being seen in our clinic the patient reports doing overall well.  He did have a few further episodes of atrial fibrillation.  He converted to sinus rhythm March 2024 after getting IV fluids.  He again presented to the emergency room in September 2024, but converted to sinus rhythm prior to cardioversion.  His nebivolol  was increased at that time.  he denies chest pain, palpitations, dyspnea, PND, orthopnea, nausea, vomiting, dizziness, syncope, edema, weight gain, or early satiety.   Review of systems complete and found to be negative unless listed in HPI.   EP Information / Studies Reviewed:    EKG is ordered today. Personal review as below.  EKG Interpretation Date/Time:  Thursday July 31 2023 10:25:01 EST Ventricular Rate:  47 PR Interval:  198 QRS Duration:  80 QT Interval:  480 QTC Calculation: 424 R Axis:   107  Text Interpretation: Sinus bradycardia with occasional Premature ventricular complexes Rightward axis Septal infarct (cited on or before 10-Apr-2023) When compared with ECG of 10-Apr-2023 08:52, No significant change was found Confirmed by [name] ([id number]) on 07/31/2023 10:34:36 AM     Risk Assessment/Calculations:    CHA2DS2-VASc Score = 4   This indicates a 4.8% annual risk of stroke. The patient's score is based upon: CHF History: 1 HTN History: 1 Diabetes History: 0 Stroke History: 0 Vascular Disease History: 1 Age Score: 1 Gender Score: 0              Physical Exam:   VS:  BP 122/70 (BP Location: Left Arm, Patient Position: Sitting, Cuff Size: Normal)   Pulse (!) 47   Ht 5' 7 (1.702 m)   Wt 142 lb 6.4 oz (64.6 kg)   SpO2 97%   BMI 22.30 kg/m    Wt Readings from Last 3 Encounters:  07/31/23 142 lb 6.4 oz (64.6 kg)  06/25/23 147 lb (66.7 kg)  04/10/23 137 lb (62.1 kg)     GEN: Well nourished, well developed in no acute distress NECK: No JVD; No carotid bruits CARDIAC: Regular rate and rhythm, no murmurs, rubs, gallops RESPIRATORY:  Clear to auscultation without rales, wheezing or rhonchi  ABDOMEN: Soft, non-tender, non-distended EXTREMITIES:  No edema; No deformity   ASSESSMENT AND PLAN:    1..  Persistent atrial fibrillation: Post ablation 08/02/2021.  He has had a few more episodes of atrial fibrillation.  Despite this, he is happy with his control.  [name] continue with current management.  I did tell him that if he changes his mind and wishes for ablation, would be happy to schedule over the phone.  2.  Typical atrial flutter: Post ablation 09/29/2019.  No obvious recurrence  3.  Coronary artery disease: No current angina.  Plan per primary cardiology  4.  Valvular heart disease: Plan per primary cardiology  5.  Aortic aneurysm: Follows with cardiac surgery  Follow up with Afib Clinic in 6 months  Signed, [name] Drew Norton, MD

## 2023-07-31 NOTE — Patient Instructions (Signed)
 Medication Instructions:  Your physician recommends that you continue on your current medications as directed. Please refer to the Current Medication list given to you today.  *If you need a refill on your cardiac medications before your next appointment, please call your pharmacy*   Lab Work: None ordered   Testing/Procedures: None ordered   Follow-Up: At [facility], you and your health needs are our priority.  As part of our continuing mission to provide you with exceptional heart care, we have created designated Provider Care Teams.  These Care Teams include your primary Cardiologist (physician) and Advanced Practice Providers (APPs -  Physician Assistants and Nurse Practitioners) who all work together to provide you with the care you need, when you need it.  Your next appointment:   6 month(s)  The format for your next appointment:   In Person  Provider:   You will follow up in the Atrial Fibrillation Clinic located at [facility]. Your provider will be: Clint R. Fenton, PA-C or Lake Bells, PA-C    Thank you for choosing [facility]!!   Dory Horn, RN [phone]

## 2023-08-04 NOTE — Progress Notes (Signed)
Cardiology Office Note:  .   Date:  08/07/2023  ID:  JERIMIA VANLANDINGHAM, DOB 10/29/1945, MRN [id number] PCP: Zoila Shutter, MD  [facility] Providers Cardiologist:  Armanda Magic, MD Electrophysiologist:  Regan Lemming, MD    Patient Profile: .      PMH Coronary artery disease S/p CABG x 2 in 1998 (LIMA-LAD, vein graft to RCA) LHC 08/2018 (NSTEMI): Ostial LAD 100%, ost to prox RCA 100%, origin lesion 100%, prox Cx 50%, patent LIMA to LAD, occluded graft to RCA Chronic diastolic heart failure Echo 08/26/2022: EF 60-65, mild LAE, mild AI, mild calcification aortic valve without stenosis, aortic root dil 51 mm, ascending aortic dil 47 mm PAF/a-flutter Onset 05/11/2019 Chronic anticoagulation Successful DCCV performed at [facility] ED  DCCV 08/10/2019: successful DCCV x 2 05/21/2021: successful A-flutter ablation 09/29/2019 DCCV 07/12/2021: successful A-fib ablation 08/02/2021 SVT Valve disease Aortic aneurysm Followed by CT surgery Aneurysm of aortic root  51 mm on TTE 08/26/2022 47 mm on CT 09/11/22 Ascending aortic dilatation  47 mm on TTE 08/26/2022 46 mm on CT 221/2024 Hypertension Hyperlipidemia OSA CKD Stage IV  Followed by [facility]  He established with cardiology 12/22/2014, seen by Dr. Mayford Knife. Referred to EP after being found to be in atrial flutter in [facility] ED October 2020. He is followed by Drs. Turner and Camnitz for the above outlined history.  Seen by Carlos Levering, NP on 11/05/2022 for ED follow-up. On 10/10/2022, he went to ED for chest pain and palpitations. Chest pain described as dull ache in central chest with radiation to the back and associated shortness of breath. Initial EKG showed A fib at 104 bpm. He had tenderness to palpitation of his chest. Trop negative x 2. He converted to NSR with IV fluids and was discharged. At office visit, he reported dehydration as potential trigger. He continued to have chest tenderness felt to  be 2/2 muscular strain. He admitted depression following a break-up with his long-time girlfriend and feeling isolated. He reported anxiety around heart rate and concerns about his heart and he was encouraged to consider an Apple watch.   Seen by Dr. Elberta Fortis 07/31/2023 at which time he reported that he was happy with current management of occasional episodes of a fib. He was encouraged to follow-up in 6 months with [facility].       History of Present Illness: .   [name] is a very pleasant 78 y.o. male who is here today for follow-up of hypertension and CAD.  He reports he is feeling well from a cardiac perspective.  He has been struggling for several months with the break-up of a girlfriend.  He had an injection in his eye by the retina specialist today and is having some tearing and blurry vision.  He admits he has not been as active at the gym recently.  He plans to resume workouts soon.  Admits to some dietary indiscretion as well with diet and regular soda.  He denies chest pain, shortness of breath, orthopnea, PND, palpitations, presyncope, syncope.  No bleeding concerns.  Discussed the use of AI scribe software for clinical note transcription with the patient, who gave verbal consent to proceed.   ROS: See HPI       Studies Reviewed: .        Risk Assessment/Calculations:    CHA2DS2-VASc Score = 4   This indicates a 4.8% annual risk of stroke. The patient's score is based upon: CHF  History: 1 HTN History: 1 Diabetes History: 0 Stroke History: 0 Vascular Disease History: 1 Age Score: 1 Gender Score: 0            Physical Exam:   VS:  BP 128/60 (BP Location: Left Arm, Patient Position: Sitting, Cuff Size: Normal)   Pulse 60   Resp 16   Ht 5\' 7"  (1.702 m)   Wt 143 lb 9.6 oz (65.1 kg)   SpO2 93%   BMI 22.49 kg/m    Wt Readings from Last 3 Encounters:  08/07/23 143 lb 9.6 oz (65.1 kg)  07/31/23 142 lb 6.4 oz (64.6 kg)  06/25/23 147 lb (66.7 kg)    GEN: Well  nourished, well developed in no acute distress NECK: No JVD; No carotid bruits CARDIAC: RRR, no murmurs, rubs, gallops RESPIRATORY:  Clear to auscultation without rales, wheezing or rhonchi  ABDOMEN: Soft, non-tender, non-distended EXTREMITIES:  No edema; No deformity     ASSESSMENT AND PLAN: .    CAD without angina: S/p CABG x 2 in 1998 (LIMA-LAD, vein graft to RCA). LHC 08/2018 (NSTEMI): Ostial LAD 100%, ost to prox RCA 100%, origin lesion 100%, prox Cx 50%, patent LIMA to LAD, occluded graft to RCA. Today, he denies chest pain, dyspnea, or other symptoms concerning for angina. No indication for further ischemic evaluation at this time. Focus on secondary prevention including heart healthy mostly plant based diet avoiding saturated fat, processed foods, simple carbohydrates, and sugar along with aiming for at least 150 minutes of moderate intensity exercise each week.  He is not on aspirin in the setting of chronic OAC.  Continue GDMT including amlodipine, losartan, rosuvastatin, nebivolol.  Hypertension: BP is stable here today. He has not been monitoring at home consistently. Will resume routine home monitoring.   Persistent atrial fibrillation on chronic anticoagulation: Clinically appears to be in sinus rhythm today. HR is well controlled.  No tachycardia or palpitations to his awareness.  We will continue nebivolol for rate control.  No bleeding concerns.  Continue Eliquis 5 mg twice daily which is appropriate dose for stroke prevention for CHADsVASc score of 4.   Aortic dilatation: Stable aortic root and ascending aortic aneurysm followed by Dr. Laneta Simmers, CT surgery. He was advised to have repeat CTA and follow-up in 1 year which is due 09/2023. Have encouraged him to call the office for an appointment.   Hyperlipidemia LDL goal < 55: Lipid panel completed 09/30/2022 with total cholesterol 95, HDL 35, LDL 38, and triglycerides 324.  We will repeat lipid panel today.  Continue rosuvastatin.  OSA  on CPAP: He reports compliance with CPAP. No acute concerns today. Management per neurology.   CKD Stage 3b: He reports he is now being followed by nephrology and renal function has been stable.  Renal function stable on BMP 04/06/2023.  We will recheck today.       Dispo: 1 year with Dr. Mayford Knife  Signed, Eligha Bridegroom, NP-C

## 2023-08-07 ENCOUNTER — Ambulatory Visit: Payer: Medicare Other | Attending: Cardiology | Admitting: Nurse Practitioner

## 2023-08-07 ENCOUNTER — Encounter: Payer: Self-pay | Admitting: Nurse Practitioner

## 2023-08-07 VITALS — BP 128/60 | HR 60 | Resp 16 | Ht 67.0 in | Wt 143.6 lb

## 2023-08-07 DIAGNOSIS — I4819 Other persistent atrial fibrillation: Secondary | ICD-10-CM

## 2023-08-07 DIAGNOSIS — I2510 Atherosclerotic heart disease of native coronary artery without angina pectoris: Secondary | ICD-10-CM | POA: Diagnosis present

## 2023-08-07 DIAGNOSIS — Z7901 Long term (current) use of anticoagulants: Secondary | ICD-10-CM | POA: Diagnosis present

## 2023-08-07 DIAGNOSIS — I10 Essential (primary) hypertension: Secondary | ICD-10-CM

## 2023-08-07 DIAGNOSIS — N1832 Chronic kidney disease, stage 3b: Secondary | ICD-10-CM

## 2023-08-07 DIAGNOSIS — I7121 Aneurysm of the ascending aorta, without rupture: Secondary | ICD-10-CM

## 2023-08-07 DIAGNOSIS — I77810 Thoracic aortic ectasia: Secondary | ICD-10-CM | POA: Diagnosis present

## 2023-08-07 DIAGNOSIS — G4733 Obstructive sleep apnea (adult) (pediatric): Secondary | ICD-10-CM | POA: Diagnosis present

## 2023-08-07 NOTE — Patient Instructions (Signed)
Medication Instructions:   Your physician recommends that you continue on your current medications as directed. Please refer to the Current Medication list given to you today.   *If you need a refill on your cardiac medications before your next appointment, please call your pharmacy*   Lab Work:  TODAY!!!!! LIPID/CMET  If you have labs (blood work) drawn today and your tests are completely normal, you will receive your results only by: MyChart Message (if you have MyChart) OR A paper copy in the mail If you have any lab test that is abnormal or we need to change your treatment, we will call you to review the results.   Testing/Procedures:  None ordered.   Follow-Up: At [facility], you and your health needs are our priority.  As part of our continuing mission to provide you with exceptional heart care, we have created designated Provider Care Teams.  These Care Teams include your primary Cardiologist (physician) and Advanced Practice Providers (APPs -  Physician Assistants and Nurse Practitioners) who all work together to provide you with the care you need, when you need it.  We recommend signing up for the patient portal called "MyChart".  Sign up information is provided on this After Visit Summary.  MyChart is used to connect with patients for Virtual Visits (Telemedicine).  Patients are able to view lab/test results, encounter notes, upcoming appointments, etc.  Non-urgent messages can be sent to your provider as well.   To learn more about what you can do with MyChart, go to [email or web address].    Your next appointment:   1 year(s)  Provider:   Armanda Magic, MD     Call Dr. Sharee Pimple office to schedule appointment for your Aortic Aneurysm @ [phone] Other Instructions  Your physician wants you to follow-up in: 1 year.  You will receive a reminder letter in the mail two months in advance. If you don't receive a letter, please call our office to schedule the  follow-up appointment.    1st Floor: - Lobby - Registration  - Pharmacy  - Lab - Cafe  2nd Floor: - PV Lab - Diagnostic Testing (echo, CT, nuclear med)  3rd Floor: - Vacant  4th Floor: - TCTS (cardiothoracic surgery) - AFib Clinic - Structural Heart Clinic - Vascular Surgery  - Vascular Ultrasound  5th Floor: - HeartCare Cardiology (general and EP) - Clinical Pharmacy for coumadin, hypertension, lipid, weight-loss medications, and med management appointments    Valet parking services will be available as well.

## 2023-08-08 ENCOUNTER — Other Ambulatory Visit: Payer: Self-pay

## 2023-08-08 LAB — LIPID PANEL
Chol/HDL Ratio: 1.9 {ratio} (ref 0.0–5.0)
Cholesterol, Total: 85 mg/dL — ABNORMAL LOW (ref 100–199)
HDL: 45 mg/dL (ref >39)
LDL Chol Calc (NIH): 25 mg/dL (ref 0–99)
Triglycerides: 69 mg/dL (ref 0–149)
VLDL Cholesterol Cal: 15 mg/dL (ref 5–40)

## 2023-08-08 LAB — COMPREHENSIVE METABOLIC PANEL
ALT: 22 [IU]/L (ref 0–44)
AST: 31 [IU]/L (ref 0–40)
Albumin: 4.2 g/dL (ref 3.8–4.8)
Alkaline Phosphatase: 99 [IU]/L (ref 44–121)
BUN/Creatinine Ratio: 14 (ref 10–24)
BUN: 21 mg/dL (ref 8–27)
Bilirubin Total: 0.5 mg/dL (ref 0.0–1.2)
CO2: 23 mmol/L (ref 20–29)
Calcium: 8.8 mg/dL (ref 8.6–10.2)
Chloride: 108 mmol/L — ABNORMAL HIGH (ref 96–106)
Creatinine, Ser: 1.46 mg/dL — ABNORMAL HIGH (ref 0.76–1.27)
Globulin, Total: 1.9 g/dL (ref 1.5–4.5)
Glucose: 107 mg/dL — ABNORMAL HIGH (ref 70–99)
Potassium: 4.2 mmol/L (ref 3.5–5.2)
Sodium: 147 mmol/L — ABNORMAL HIGH (ref 134–144)
Total Protein: 6.1 g/dL (ref 6.0–8.5)
eGFR: 50 mL/min/{1.73_m2} — ABNORMAL LOW (ref >59)

## 2023-08-08 MED ORDER — LOSARTAN POTASSIUM 25 MG PO TABS: 25.0000 mg | ORAL_TABLET | Freq: Every day | ORAL | 3 refills | Status: DC

## 2023-08-25 ENCOUNTER — Other Ambulatory Visit: Payer: Self-pay | Admitting: Surgery

## 2023-08-25 DIAGNOSIS — I712 Thoracic aortic aneurysm, without rupture, unspecified: Secondary | ICD-10-CM

## 2023-08-26 ENCOUNTER — Telehealth: Payer: Self-pay | Admitting: Neurology

## 2023-08-26 NOTE — Telephone Encounter (Signed)
LVM and sent mychart msg informing pt of need to reschedule 12/25/23 appt - MD out

## 2023-09-29 ENCOUNTER — Ambulatory Visit
Admission: RE | Admit: 2023-09-29 | Discharge: 2023-09-29 | Disposition: A | Discharge status: Home / Self Care | Payer: Medicare Other | Source: Ambulatory Visit | Attending: Surgery | Admitting: Surgery

## 2023-09-29 DIAGNOSIS — I712 Thoracic aortic aneurysm, without rupture, unspecified: Secondary | ICD-10-CM

## 2023-09-29 MED ORDER — IOPAMIDOL (ISOVUE-370) INJECTION 76%
60.0000 mL | Freq: Once | INTRAVENOUS | Status: AC | PRN
  Administered 2023-09-29: 60 mL via INTRAVENOUS

## 2023-09-29 MED ORDER — IOPAMIDOL (ISOVUE-370) INJECTION 76%: 60.0000 mL | Freq: Once | INTRAVENOUS | Status: DC | PRN

## 2023-10-02 NOTE — Progress Notes (Signed)
 [address]       [place] [address]             [phone]        [name] [id number] [birth year]   History of Present Illness:  The patient is a 78 year old active gentleman with a history of hypertension, hyperlipidemia, stage III chronic kidney disease, coronary artery disease status post CABG in 1998 in [facility], and known bicuspid aortic valve with a dilated aortic root that has been followed by Dr. Nevada Crane at [facility].  He was admitted to [facility] in 2020 with NSTEMI and cardiac catheterization in 08/2018 showed an occluded left radial artery graft to the RCA with a patent LIMA to the LAD with left-to-right collaterals.  He was continued on medical therapy and has been followed by Dr. Mayford Knife for his cardiology care.  CTA of the chest and 05/2019 had shown a 4.7 cm aortic root.  He underwent ablation for atrial flutter in March 2021 and has been followed by Dr. Elberta Fortis.  He underwent repeat ablation on 08/02/2021.  I first saw him in March 2023 because he decided that he would like to establish all of his medical care in [place].  CTA of the chest at that time showed a 4.8-5 cm aortic root and 4.7 cm fusiform ascending aortic aneurysm that has been stable dating back to at least 2020.  He had an echocardiogram showing an indeterminate number of valve cusps with mild aortic insufficiency and normal LV dimensions and systolic function.  The aortic root on echo was noted to be 4.6 cm at the sinus level with an ascending aorta of 4.7 cm.   He presents for 1 year follow up with repeat CT scan.  Overall patient does very well.  He denies chest pain, shortness of breath, and palpitations.  He tries to be very active and is hoping to [facility] [employer] soon.  He does not smoke.  He states his blood pressure is elevated here, but is normally less than 120 at home.  Current Outpatient Medications on File Prior to Visit  Medication Sig Dispense Refill   ALPRAZolam  (XANAX XR) 1 MG 24 hr tablet TAKE 1 TABLET BY MOUTH DAILY 90 tablet 1   amLODipine (NORVASC) 5 MG tablet Take 1 tablet (5 mg total) by mouth daily. 90 tablet 1   apixaban (ELIQUIS) 5 MG TABS tablet Take 1 tablet (5 mg total) by mouth 2 (two) times daily. 60 tablet 11   Ascorbic Acid (VITAMIN C) 1000 MG tablet Take 1,000 mg by mouth daily.     aspirin-acetaminophen-caffeine (EXCEDRIN MIGRAINE) 250-250-65 MG tablet Take by mouth as needed for headache.     butalbital-aspirin-caffeine-codeine (FIORINAL WITH CODEINE) 50-325-40-30 MG capsule Take 1 capsule by mouth every 4 (four) hours as needed for pain.     diazepam (VALIUM) 5 MG tablet Take 2.5-5 mg by mouth 2 (two) times daily as needed for anxiety.      furosemide (LASIX) 20 MG tablet TAKE 1 TABLET BY MOUTH DAILY 90 tablet 2   ibuprofen (ADVIL,MOTRIN) 200 MG tablet Take 200-400 mg by mouth every 8 (eight) hours as needed (for arthritis in the feet).     losartan (COZAAR) 25 MG tablet Take 1 tablet (25 mg total) by mouth daily. 90 tablet 3   Nebivolol HCl 20 MG TABS TAKE 1 TABLET BY MOUTH DAILY 90 tablet 2   niacin 500 MG tablet Take 500  mg by mouth at bedtime.     rosuvastatin (CRESTOR) 20 MG tablet TAKE 1 TABLET BY MOUTH DAILY 90 tablet 3   Specialty Vitamins Products (CENTRUM SPECIALIST ENERGY) TABS Take 1 tablet by mouth daily with breakfast.     tadalafil (CIALIS) 5 MG tablet Take 5 mg by mouth every evening.     Testosterone 40.5 MG/2.5GM (1.62%) GEL Place 4 Pump onto the skin daily. Apply 4 pumps to each shoulder every morning     traZODone (DESYREL) 50 MG tablet TAKE 1 AND 1/2 TABLET BY MOUTH EVERY NIGHT AT BEDTIME 135 tablet 1   Turmeric 500 MG CAPS Take 500 mg by mouth daily.     vitamin E 400 UNIT capsule Take 400 Units by mouth at bedtime.      No current facility-administered medications on file prior to visit.     Physical Exam  BP 138/61 (BP Location: Left Arm)   Pulse (!) 55   Resp 18   Ht 5\' 7"  (1.702 m)   Wt 142 lb  (64.4 kg)   SpO2 99%   BMI 22.24 kg/m   Gen; healthy appearing male Neck: no carotid bruit Heart: RRR Lungs: CTA bilaterally Abd: soft non-distended, non-tender Ext; no edema  CTA Results:  FINDINGS: Cardiovascular: Again seen aortic dilatation. Aorta at the sinuses of Valsalva is dilated at 4.7 cm, previously 4.7 cm. At the sinotubular junction the aorta measures 4.1 cm, previously 4.4 cm. Mid ascending aorta measures 4.8 x 4.9 cm, previously 4.6 x 4.5 cm. The transverse and descending aorta are normal in caliber. Moderate aortic atherosclerosis. The heart is enlarged. There are coronary artery calcifications. No pericardial effusion.   Mediastinum/Nodes: No enlarged mediastinal, hilar, or axillary lymph nodes. Patulous esophagus as before. No thyroid nodule.   Lungs/Pleura: Mild emphysema. Scattered areas of subpleural reticulation and ground-glass in both lung bases. No acute airspace disease. No pulmonary nodule. No pleural fluid.   Upper Abdomen: The previous enhancing focus in the right lobe of the liver is not definitively seen. Benign upper pole left renal cyst. No further follow-up imaging is recommended.   Musculoskeletal: Prior median sternotomy. Multifocal sclerosis throughout the thoracic spine, majority centered on the endplates and likely Modic endplate changes. There is also sclerosis of the anterior aspects of the T5 and T6 vertebral bodies. This is stable from prior. No acute osseous findings.   Review of the MIP images confirms the above findings.   IMPRESSION: 1. Ascending aortic aneurysm measuring 4.8 x 4.9 cm, previously 4.6 x 4.5 cm. Recommend semi-annual imaging followup by CTA or MRA and referral to cardiothoracic surgery if not already obtained. This recommendation follows 2010 ACCF/AHA/AATS/ACR/ASA/SCA/SCAI/SIR/STS/SVM Guidelines for the Diagnosis and Management of Patients With Thoracic Aortic Disease. Circulation. 2010; 121: Z610-R604.  Aortic aneurysm NOS (ICD10-I71.9) 2. Stable cardiomegaly. Coronary artery calcifications. 3. Similar findings of subpleural reticulation and emphysema. Findings may represent underlying interstitial lung disease. This can be further assessed with high-resolution chest CT as clinically indicated.   Aortic Atherosclerosis (ICD10-I70.0) and Emphysema (ICD10-J43.9).     Electronically Signed   By: Narda Rutherford M.D.   On: 09/29/2023 17:05  A/P:  Aortic Root Aneurysm- stable at 4.7 Ascending Aortic Aneurysm- previously measured 4.6 x 4.5.Marland Kitchen this increased to 4.8 x 4.9 cm on this scan CV- H/O Atrial Fibrillation HTN-compliant with all medications, stressed importance of monitoring blood pressure Major Depressive Disorder HLD- continue Crestor CKD Stage 3- established care at [facility]  Plan: with slight increase in Ascending Aortic  Aneurysm.. will plan to repeat CTA of chest in 6 months time with Dr. Laneta Simmers to ensure stability and no further growth is occurring.  Risk Modification:  Statin:  Yes  Smoking cessation instruction/counseling given:  Former  Patient was counseled on importance of Blood Pressure Control.  Despite Medical intervention if the patient notices persistently elevated blood pressure readings.  They are instructed to contact their Primary Care Physician  Please avoid use of Fluoroquinolones as this can potentially increase your risk of Aortic Rupture and/or Dissection  Patient educated on signs and symptoms of Aortic Dissection, handout also provided in AVS  Cabrini Ruggieri, PA-C 10/02/23

## 2023-10-02 NOTE — Patient Instructions (Signed)
 Make every effort to maintain a "heart-healthy" lifestyle with regular physical exercise and adherence to a low-fat, low-carbohydrate diet.  Continue to seek regular follow-up appointments with your primary care physician and/or cardiologist.  AVOID FLOUROQUINOLONES AS THESE CAN INCREASE YOUR RISK OF AORTIC DISSECTION ( ex: Ciprofloxacin)

## 2023-10-03 ENCOUNTER — Telehealth (HOSPITAL_COMMUNITY): Payer: Self-pay

## 2023-10-03 NOTE — Telephone Encounter (Signed)
 Patient called to let us know he is in A-fib. Started today. He is not feeling bad. HR 60-90 range and B/P 108/71. Patient instructed to monitor through the weekend. If his A-fib continues through the weekend meaning being persistent he was told to give [place] a call on Monday and we would have him come in for appointment next week for his A-fib. Consulted with patient and he verbalized understanding.

## 2023-10-08 ENCOUNTER — Ambulatory Visit: Payer: Medicare Other | Admitting: Surgery

## 2023-10-08 ENCOUNTER — Ambulatory Visit: Payer: Medicare Other

## 2023-10-08 ENCOUNTER — Other Ambulatory Visit (HOSPITAL_COMMUNITY): Payer: Self-pay | Admitting: Physician Assistant

## 2023-10-13 ENCOUNTER — Ambulatory Visit (INDEPENDENT_AMBULATORY_CARE_PROVIDER_SITE_OTHER): Payer: Self-pay | Admitting: Physician Assistant

## 2023-10-13 VITALS — BP 138/61 | HR 55 | Resp 18 | Ht 67.0 in | Wt 142.0 lb

## 2023-10-13 DIAGNOSIS — I7121 Aneurysm of the ascending aorta, without rupture: Secondary | ICD-10-CM

## 2023-11-04 ENCOUNTER — Other Ambulatory Visit: Payer: Self-pay

## 2023-11-04 MED ORDER — TRAZODONE HCL 50 MG PO TABS: 75.0000 mg | ORAL_TABLET | Freq: Every day | ORAL | 1 refills | Status: DC

## 2023-11-07 ENCOUNTER — Other Ambulatory Visit: Payer: Self-pay | Admitting: Cardiology

## 2023-12-17 ENCOUNTER — Other Ambulatory Visit: Payer: Self-pay | Admitting: Cardiology

## 2023-12-18 NOTE — Progress Notes (Signed)
 [name]

## 2023-12-22 ENCOUNTER — Encounter: Payer: Self-pay | Admitting: Neurology

## 2023-12-22 ENCOUNTER — Ambulatory Visit (INDEPENDENT_AMBULATORY_CARE_PROVIDER_SITE_OTHER): Admitting: Neurology

## 2023-12-22 VITALS — BP 119/58 | HR 54 | Ht 67.0 in | Wt 141.0 lb

## 2023-12-22 DIAGNOSIS — G43109 Migraine with aura, not intractable, without status migrainosus: Secondary | ICD-10-CM | POA: Diagnosis not present

## 2023-12-22 DIAGNOSIS — G4733 Obstructive sleep apnea (adult) (pediatric): Secondary | ICD-10-CM | POA: Diagnosis not present

## 2023-12-22 DIAGNOSIS — R252 Cramp and spasm: Secondary | ICD-10-CM | POA: Diagnosis not present

## 2023-12-22 DIAGNOSIS — I480 Paroxysmal atrial fibrillation: Secondary | ICD-10-CM | POA: Diagnosis not present

## 2023-12-22 DIAGNOSIS — I5032 Chronic diastolic (congestive) heart failure: Secondary | ICD-10-CM | POA: Insufficient documentation

## 2023-12-22 MED ORDER — MAGNESIUM 125 MG PO CAPS: ORAL_CAPSULE | ORAL | 3 refills | Status: AC

## 2023-12-22 MED ORDER — BUTALBITAL-ASA-CAFF-CODEINE 50-325-40-30 MG PO CAPS: 1.0000 | ORAL_CAPSULE | ORAL | 1 refills | Status: DC | PRN

## 2023-12-22 MED ORDER — TRAZODONE HCL 50 MG PO TABS: 75.0000 mg | ORAL_TABLET | Freq: Every day | ORAL | 1 refills | Status: DC

## 2023-12-22 MED ORDER — ALPRAZOLAM ER 1 MG PO TB24: 1.0000 mg | ORAL_TABLET | Freq: Every day | ORAL | 1 refills | Status: DC

## 2023-12-22 NOTE — Patient Instructions (Addendum)
 ASSESSMENT AND PLAN 78 y.o. year old male  here with:    1) controlled OSA , keep on CPAP.  Insomnia has been unrelated to apnea.  Recently developed nocturnal leg spasms, painful, forcing him out of bed. I will start nocturnal Magnesium ,  diet tonic water at dinner may help.  2) fairly well controlled migraine, ( used to a have 15 or more a months - chronic migraine, now 5 a months)  on fiorinal, on beta blocker, but may need to start Florette Hurry ( for non - vaso-constrictive mediation in migraine )  due to CAD and possible PAD.Aaron Aas Consider Ajovy in the future.  Ubrelvy  samples given today   to try if it works as well as fiorinal - may become a replacement in the future.  Plan :   refilled medication , added magnesium   and recommend a quinine soft drink at dinner.   4) Plan B ; if cramping is not responsive to the non prescription steps, will order a NCV and EMG study.    Cc Jacquelynne Matters, Md [address] [place],  [place] [address]

## 2023-12-22 NOTE — Progress Notes (Signed)
 Provider:  Neomia Banner, MD  Primary Care Physician:  Jacquelynne Matters, MD [address] [place] [address]     Referring Provider: Jacquelynne Matters, Md [address] [place],  [place] [address]          Chief Complaint according to patient   Patient presents with:                HISTORY OF PRESENT ILLNESS:  [name] is a 78 y.o. male patient , who is here for revisit 12/22/2023 for headaches, OSA ,   Insomnia and apnea.  Fiorinal is his rescue drug.  .  Chief concern according to patient :  " just refills and  compliance heck -up" [name] also describes a relatively new onset of leg cramps only at night waking him from sleep these cramps appear to be localized to the thigh and the calves and feet they appear bilaterally, they are not present in daytime and they seem to be independent of the activity level in daytime before. There is no joint pain component.  This is non -radiating and  appears to affect only the muscles. He will need to leave the bed and walk it off.    Sometimes 4 times at night.   He has CAD and atrial fib, followed by Dr Starr Eddy.  He is on diuretics, lasix  / furosemide , but his K supplements have not changed this, he has been on the same diuretic for many years without the problems.  He has controled cholesterol levels.     His last filling for Fiorinal was on 4-30225 and he still has 15 capsules  left, 30 capsules.   30 last usually 3 months.  He travels a lot, by plane, car and train - and appreciates the easy relief without needing to visit an ED.   Alprazolam  XR for insomnia , each night.   (He has a Diazepam  prescription from PCP for anxiety PRN , this was prescribed with approval of psychiatrist) .         Review of Systems: Out of a complete 14 system review, the patient complains of only the following symptoms, and all other reviewed systems are negative.:   Leg cramping at  night.  Spasms not dysesthesias.   Social History   Socioeconomic History   Marital status: Divorced    Spouse name: Not on file   Number of children: 0   Years of education: Not on file   Highest education level: Not on file  Occupational History   Occupation: retired  Tobacco Use   Smoking status: Former    Current packs/day: 0.00    Types: Cigarettes    Start date: 07/22/1966    Quit date: 07/22/1986    Years since quitting: 37.4   Smokeless tobacco: Never   Tobacco comments:    Former smoker 05/09/2021  Vaping Use   Vaping status: Never Used  Substance and Sexual Activity   Alcohol use: Yes    Alcohol/week: 1.0 standard drink of alcohol    Types: 1 Glasses of wine per week    Comment: 0-1 daily   Drug use: No   Sexual activity: Yes  Other Topics Concern   Not on file  Social History Narrative   Divorced   Publishing rights manager- retired   No children   Has a Medical laboratory scientific officer named Hayden Lipoma   Enjoys outdoor activities- hiking, swimming,  archeology, baseball, writing, reading, cooking   Social Drivers of Health   Financial Resource Strain: Medium Risk (04/07/2021)   [facility] prior to 09/21/2022., [facility] visits prior to 09/21/2022.   Overall Financial Resource Strain (CARDIA)    Difficulty of Paying Living Expenses: Somewhat hard  Food Insecurity: Low Risk  (11/10/2023)   [facility]   Hunger Vital Sign    Worried About Running Out of Food in the Last Year: Never true    Ran Out of Food in the Last Year: Never true  Transportation Needs: No Transportation Needs (11/10/2023)   Received from [facility]    In the past 12 months, has lack of reliable transportation kept you from medical appointments, meetings, work or from getting things needed for daily living? : No  Physical Activity: Sufficiently Active (04/07/2021)   Received from [facility] visits prior to  09/21/2022., [facility] visits prior to 09/21/2022.   Exercise Vital Sign    Days of Exercise per Week: 5 days    Minutes of Exercise per Session: 70 min  Stress: Stress Concern Present (04/07/2021)   [facility] prior to 09/21/2022., [facility] visits prior to 09/21/2022.   [employer] of Occupational Health - Occupational Stress Questionnaire    Feeling of Stress : Rather much  Social Connections: Moderately Integrated (04/07/2021)   Received from [facility] visits prior to 09/21/2022., [facility] visits prior to 09/21/2022.   Social Advertising account executive [NHANES]    Frequency of Communication with Friends and Family: Twice a week    Frequency of Social Gatherings with Friends and Family: Twice a week    Attends Religious Services: 1 to 4 times per year    Active Member of [employer] or Organizations: Yes    Attends Engineer, structural: 1 to 4 times per year    Marital Status: Divorced    Family History  Problem Relation Age of Onset   Arthritis Mother        deceased   Hyperlipidemia Mother    Diabetes Mother    Arthritis Father    Hyperlipidemia Father    Heart disease Father 34   Stroke Father 71       deceased   Hypertension Father    Diabetes Father    Kidney disease Paternal Grandfather    Colon cancer Neg Hx    Colon polyps Neg Hx    Esophageal cancer Neg Hx    Stomach cancer Neg Hx    Rectal cancer Neg Hx    Sleep apnea Neg Hx     Past Medical History:  Diagnosis Date   Anal fissure    Aortic insufficiency    Arthritis    Atrial flutter (HCC)    Bicuspid aortic valve    with mild to moderate AR by echo 08/2019   BPH (benign prostatic hyperplasia)    CAD (coronary artery disease)    s/p CABG [place] 1998   Cataract    bilateral-removed   CKD (chronic kidney disease), stage III (HCC)    Depression    Dilated  aortic root (HCC)    History of blood transfusion 1998   "due to cardiac bypass surgery"   Hyperlipidemia    Hypertension    Hypogonadism in male  Kidney stones    Migraine    Mild carotid artery disease (HCC) 2009   Myocardial infarction (HCC)    PVC's (premature ventricular contractions)    Retinal hemorrhage 11/04/2014   Sleep apnea with use of continuous positive airway pressure (CPAP)    Thoracic aortic aneurysm (TAA) (HCC)    51mm aoartic root and 47mm ascending aorta by echo 08/2022    Past Surgical History:  Procedure Laterality Date   A-FLUTTER ABLATION N/A 09/29/2019   Procedure: A-FLUTTER ABLATION;  Surgeon: Lei Pump, MD;  Location: [facility] INVASIVE CV LAB;  Service: Cardiovascular;  Laterality: N/A;   ATRIAL FIBRILLATION ABLATION N/A 08/02/2021   Procedure: ATRIAL FIBRILLATION ABLATION;  Surgeon: Lei Pump, MD;  Location: [facility] INVASIVE CV LAB;  Service: Cardiovascular;  Laterality: N/A;   CARDIOVERSION N/A 08/10/2019   Procedure: CARDIOVERSION;  Surgeon: Euell Herrlich, MD;  Location: [facility] ENDOSCOPY;  Service: Cardiovascular;  Laterality: N/A;   CARDIOVERSION N/A 05/21/2021   Procedure: CARDIOVERSION;  Surgeon: Loyde Rule, MD;  Location: [facility] ENDOSCOPY;  Service: Cardiovascular;  Laterality: N/A;   CARDIOVERSION N/A 07/12/2021   Procedure: CARDIOVERSION;  Surgeon: Hugh Madura, MD;  Location: [facility] ENDOSCOPY;  Service: Cardiovascular;  Laterality: N/A;   cataracts     COLONOSCOPY     CORONARY ARTERY BYPASS GRAFT  1998   LEFT HEART CATH AND CORONARY ANGIOGRAPHY N/A 08/24/2018   Procedure: LEFT HEART CATH AND CORONARY ANGIOGRAPHY;  Surgeon: Avanell Leigh, MD;  Location: [facility] INVASIVE CV LAB;  Service: Cardiovascular;  Laterality: N/A;   TEE WITHOUT CARDIOVERSION N/A 01/19/2015   Procedure: TRANSESOPHAGEAL ECHOCARDIOGRAM (TEE);  Surgeon: Jacqueline Matsu, MD;  Location: [facility] ENDOSCOPY;  Service: Cardiovascular;  Laterality: N/A;   TOOTH EXTRACTION      VARICOSE VEIN SURGERY  1984   pt denies     Current Outpatient Medications on File Prior to Visit  Medication Sig Dispense Refill   ALPRAZolam  (XANAX  XR) 1 MG 24 hr tablet TAKE 1 TABLET BY MOUTH DAILY 90 tablet 1   amLODipine  (NORVASC ) 5 MG tablet TAKE 1 TABLET BY MOUTH DAILY 90 tablet 2   apixaban  (ELIQUIS ) 5 MG TABS tablet Take 1 tablet (5 mg total) by mouth 2 (two) times daily. 60 tablet 11   Ascorbic Acid (VITAMIN C) 1000 MG tablet Take 1,000 mg by mouth daily.     aspirin -acetaminophen -caffeine  (EXCEDRIN MIGRAINE) 250-250-65 MG tablet Take by mouth as needed for headache.     butalbital -aspirin -caffeine -codeine  (FIORINAL WITH CODEINE ) 50-325-40-30 MG capsule Take 1 capsule by mouth every 4 (four) hours as needed for pain.     diazepam  (VALIUM ) 5 MG tablet Take 2.5-5 mg by mouth 2 (two) times daily as needed for anxiety.      furosemide  (LASIX ) 20 MG tablet TAKE 1 TABLET BY MOUTH DAILY 90 tablet 2   ibuprofen (ADVIL,MOTRIN) 200 MG tablet Take 200-400 mg by mouth every 8 (eight) hours as needed (for arthritis in the feet).     losartan  (COZAAR ) 25 MG tablet Take 1 tablet (25 mg total) by mouth daily. 90 tablet 3   Nebivolol  HCl 20 MG TABS TABLET BY MOUTH TWICE A DAY IN THE MORNING AND AT BEDTIME 180 tablet 2   niacin  500 MG tablet Take 500 mg by mouth at bedtime.     potassium chloride  (KLOR-CON  M) 10 MEQ tablet Take 1 tablet by mouth daily.     rosuvastatin  (CRESTOR ) 20 MG tablet TAKE 1 TABLET BY MOUTH DAILY 90 tablet 2  Specialty Vitamins Products (CENTRUM SPECIALIST ENERGY) TABS Take 1 tablet by mouth daily with breakfast.     tadalafil (CIALIS) 5 MG tablet Take 5 mg by mouth every evening.     Testosterone  40.5 MG/2.5GM (1.62%) GEL Place 4 Pump onto the skin daily. Apply 4 pumps to each shoulder every morning     traZODone  (DESYREL ) 50 MG tablet Take 1.5 tablets (75 mg total) by mouth at bedtime. 135 tablet 1   Turmeric 500 MG CAPS Take 500 mg by mouth daily.     vitamin E 400 UNIT  capsule Take 400 Units by mouth at bedtime.      No current facility-administered medications on file prior to visit.    Allergies  Allergen Reactions   Dog Epithelium (Canis Lupus Familiaris) Itching, Anxiety, Palpitations, Other (See Comments), Cough and Shortness Of Breath    Other reaction(s): Respiratory Distress (ALLERGY/intolerance), Wheezing (ALLERGY/intolerance)   Ciprofloxacin Other (See Comments)    Avoid as patient has an aortic aneurysm and this drug can increase risk of Aortic Dissection   Mushroom Extract Complex (Obsolete) Nausea Only and Other (See Comments)    Severe vertigo and headaches     DIAGNOSTIC DATA (LABS, IMAGING, TESTING) - I reviewed patient records, labs, notes, testing and imaging myself where available.  Lab Results  Component Value Date   WBC 10.3 04/06/2023   HGB 15.0 04/06/2023   HCT 44.1 04/06/2023   MCV 105.0 (H) 04/06/2023   PLT 244 04/06/2023      Component Value Date/Time   NA 147 (H) 08/07/2023 1626   K 4.2 08/07/2023 1626   CL 108 (H) 08/07/2023 1626   CO2 23 08/07/2023 1626   GLUCOSE 107 (H) 08/07/2023 1626   GLUCOSE 161 (H) 04/06/2023 2229   BUN 21 08/07/2023 1626   CREATININE 1.46 (H) 08/07/2023 1626   CALCIUM  8.8 08/07/2023 1626   PROT 6.1 08/07/2023 1626   ALBUMIN 4.2 08/07/2023 1626   AST 31 08/07/2023 1626   ALT 22 08/07/2023 1626   ALKPHOS 99 08/07/2023 1626   BILITOT 0.5 08/07/2023 1626   GFRNONAA 40 (L) 04/06/2023 2229   GFRAA 64 07/27/2020 0954   Lab Results  Component Value Date   CHOL 85 (L) 08/07/2023   HDL 45 08/07/2023   LDLCALC 25 08/07/2023   TRIG 69 08/07/2023   CHOLHDL 1.9 08/07/2023   Lab Results  Component Value Date   HGBA1C 5.9 (H) 08/22/2018   Lab Results  Component Value Date   VITAMINB12 721 09/24/2018   Lab Results  Component Value Date   TSH 2.760 04/27/2021    PHYSICAL EXAM:  Today's Vitals   12/22/23 0928  BP: (!) 119/58  Pulse: (!) 54  Weight: 141 lb (64 kg)  Height: 5'  7" (1.702 m)   Body mass index is 22.08 kg/m.   Wt Readings from Last 3 Encounters:  12/22/23 141 lb (64 kg)  10/13/23 142 lb (64.4 kg)  08/07/23 143 lb 9.6 oz (65.1 kg)     Ht Readings from Last 3 Encounters:  12/22/23 5\' 7"  (1.702 m)  10/13/23 5\' 7"  (1.702 m)  08/07/23 5\' 7"  (1.702 m)      General: The patient is awake, alert and appears not in acute distress. The patient is well groomed. Head: Normocephalic, atraumatic. Neck is supple. Mallampati 2 ,  Dentures partial neck circumference:15. Nasal airflow patent , TMJ not evident. Retrognathia is seen.  Cardiovascular:  iregular rate and rhythm , extra beats , no bruit,  without murmurs or carotid bruit, and without distended neck veins. Respiratory: Lungs are clear to auscultation.Skin:  With evidence of mild ankle edema,   Trunk: BMI is normal. The patient's posture is erect    Neurologic exam : The patient is awake and alert, oriented to place and time.   Attention span & concentration ability appears impaired. Speech is fluent, without dysarthria, dysphonia or aphasia.  Mood and affect are appropriate.   Cranial nerves:  No change in taste or smell. Pupils are equal and briskly reactive to light.  Left eye seems to turn medial- but no diplopia. Extraocular movements  in vertical and horizontal planes intact and without nystagmus. Visual fields by finger perimetry are intact. Hearing  mildly impaired. Facial sensation intact to fine touch. Facial motor strength is symmetric and tongue and uvula move midline.  Shoulder shrug was symmetrical.    Motor exam:  Symmetric bulk, tone and ROM.   Normal tone without cog wheeling, symmetric grip strength .   Sensory:  Fine touch, pinprick and vibration were normal.  Proprioception tested in the upper extremities was normal.   Coordination: Rapid alternating movements in the fingers/hands were of normal speed.  The Finger-to-nose maneuver was intact without evidence of ataxia,  dysmetria or tremor.   Gait and station: Patient could rise unassisted from a seated position, walked without assistive device.  Stance is of normal width/ base . Toe and heel walk were deferred.  Deep tendon reflexes: in the  upper and lower extremities are symmetric and intact.  Babinski response was deferred.     ASSESSMENT AND PLAN 78 y.o. year old male  here with:    1) controlled OSA , keep on CPAP.  Insomnia has been unrelated to apnea.  Recently developed nocturnal leg spasms, painful, forcing him out of bed. I will start nocturnal Magnesium ,  diet tonic water at dinner may help.  2) fairly well controlled migraine, ( used to a have 15 or more a months - chronic migraine)  on fiorinal, on beta blocker, but may need to start Ubrelvy ( for non - vaso-constrictive mediation in migraine )  due to CAD and possible PAD.   Plan :   refilled medication , added magnesium   and recommend a quinine soft drink at dinner.   4) Plan B ; if cramping is not responsive to the non prescription steps, will order a NCV and EMG study.    If ubrelvy  or Nurtec helps his migraines just as much as Fiorinal we can switch meds in the future. I gave him Nurtec samples today, 2 tabs.    I would like to thank Jacquelynne Matters, Md [address] [place],  [place] [address] for allowing me to meet with and to take care of this pleasant patient.     After spending a total time of  35  minutes face to face and additional time for physical and neurologic examination, review of laboratory studies,  personal review of imaging studies, reports and results of other testing and review of referral information / records as far as provided in visit,   Electronically signed by: Neomia Banner, MD 12/22/2023 10:03 AM  [facility] Board certified by The [employer] of Sleep Medicine and Diplomate of the [employer] of Sleep Medicine. Board certified In  Neurology through the ABPN, Fellow of the [employer] of Neurology.

## 2023-12-23 ENCOUNTER — Encounter: Payer: Self-pay | Admitting: Neurology

## 2023-12-25 ENCOUNTER — Ambulatory Visit: Payer: Medicare Other | Admitting: Neurology

## 2024-01-07 ENCOUNTER — Ambulatory Visit: Payer: Medicare Other | Admitting: Neurology

## 2024-01-28 ENCOUNTER — Ambulatory Visit (HOSPITAL_COMMUNITY)
Admission: RE | Admit: 2024-01-28 | Discharge: 2024-01-28 | Disposition: A | Discharge status: Home / Self Care | Payer: Medicare Other | Source: Ambulatory Visit | Attending: Physician Assistant | Admitting: Physician Assistant

## 2024-01-28 ENCOUNTER — Encounter (HOSPITAL_COMMUNITY): Payer: Self-pay | Admitting: Physician Assistant

## 2024-01-28 VITALS — BP 112/64 | HR 50 | Ht 67.0 in | Wt 143.0 lb

## 2024-01-28 DIAGNOSIS — I4819 Other persistent atrial fibrillation: Secondary | ICD-10-CM | POA: Insufficient documentation

## 2024-01-28 DIAGNOSIS — D6869 Other thrombophilia: Secondary | ICD-10-CM | POA: Diagnosis present

## 2024-01-28 NOTE — Progress Notes (Signed)
 Primary Care Physician: Otho Darnelle BRAVO, MD Primary Cardiologist: Dr Shlomo Primary Electrophysiologist: Dr Inocencio Referring Physician: Dr Shlomo [name] is a 78 y.o. male with a history of CAD with CABG in 1998, bicuspid AV with moderate to severe AI and dilated aortic root followed by Dr. Theresa at [facility], dyslipidemia, HTN, OSA, atrial flutter who presents for follow up in the [facility]ibr[facility].  The patient was initially diagnosed with atrial flutter after presented to [facility] ER with symptoms of palpitations. He underwent successful DCCV. He is on Eliquis  for a CHADS2VASC score of 4. Patient saw Dr Shlomo in follow up on 06/22/19 and reported that he was having increased palpitations. A 30 day event monitor was placed. Which did show an episode of atrial flutter with HR 120. Patient reports that he did have symptoms of palpitations for about 1 hour which correspond to the event. He denies any significant alcohol use. There were no specific triggers that he could identify. He is s/p atrial flutter ablation on 09/29/19 with Dr Inocencio.  He was seen as an add on to Dr Margurite schedule on 04/27/21 and was in new onset afib. He had felt some palpitations and his home monitor showed an irregular heart beat. He was started on Eliquis  and his BB was increased. Patient is s/p DCCV on 05/21/21 but had quick return of afib. He was seen by Dr Inocencio, reloaded on amiodarone  and had DCCV on 07/12/21. He did have issues with fluid overload post DCCV and was diuresed.   Patient is s/p afib ablation with Dr Inocencio 08/02/21.   Patient returns for follow up for atrial fibrillation. He reports that he has done well since his last visit. He will occasionally have palpitations that last for a few minutes and then resolve. No bleeding issues on anticoagulation.   Today, he  denies symptoms of chest pain, shortness of breath, orthopnea, PND, lower extremity edema, dizziness,  presyncope, syncope, bleeding, or neurologic sequela. The patient is tolerating medications without difficulties and is otherwise without complaint today.    Atrial Fibrillation Risk Factors:  he does have symptoms or diagnosis of sleep apnea. he does not have a history of rheumatic fever. he does not have a history of alcohol use. The patient does not have a history of early familial atrial fibrillation or other arrhythmias.   Atrial Fibrillation Management history:  Previous antiarrhythmic drugs: amiodarone  Previous cardioversions: 04/2019 [facility]), 05/21/21, 07/12/21 Previous ablations: 09/29/19, 08/02/21 Anticoagulation history: Eliquis    Past Medical History:  Diagnosis Date   Anal fissure    Aortic insufficiency    Arthritis    Atrial flutter (HCC)    Bicuspid aortic valve    with mild to moderate AR by echo 08/2019   BPH (benign prostatic hyperplasia)    CAD (coronary artery disease)    s/p CABG [place] 1998   Cataract    bilateral-removed   CKD (chronic kidney disease), stage III (HCC)    Depression    Dilated aortic root (HCC)    History of blood transfusion 1998   due to cardiac bypass surgery   Hyperlipidemia    Hypertension    Hypogonadism in male    Kidney stones    Migraine    Mild carotid artery disease (HCC) 2009   Myocardial infarction (HCC)    PVC's (premature ventricular contractions)    Retinal hemorrhage 11/04/2014   Sleep apnea with use of continuous positive airway pressure (  CPAP)    Thoracic aortic aneurysm (TAA) (HCC)    51mm aoartic root and 47mm ascending aorta by echo 08/2022     Current Outpatient Medications  Medication Sig Dispense Refill   ALPRAZolam  (XANAX  XR) 1 MG 24 hr tablet Take 1 tablet (1 mg total) by mouth daily. 90 tablet 1   amLODipine  (NORVASC ) 5 MG tablet TAKE 1 TABLET BY MOUTH DAILY 90 tablet 2   apixaban  (ELIQUIS ) 5 MG TABS tablet Take 1 tablet (5 mg total) by mouth 2 (two) times daily. 60 tablet 11    Ascorbic Acid (VITAMIN C) 1000 MG tablet Take 1,000 mg by mouth daily.     aspirin -acetaminophen -caffeine  (EXCEDRIN MIGRAINE) 250-250-65 MG tablet Take by mouth as needed for headache.     butalbital -aspirin -caffeine -codeine  (FIORINAL WITH CODEINE ) 50-325-40-30 MG capsule Take 1 capsule by mouth every 4 (four) hours as needed for pain. 30 capsule 1   diazepam  (VALIUM ) 5 MG tablet Take 2.5-5 mg by mouth 2 (two) times daily as needed for anxiety.      furosemide  (LASIX ) 20 MG tablet TAKE 1 TABLET BY MOUTH DAILY 90 tablet 2   ibuprofen (ADVIL,MOTRIN) 200 MG tablet Take 200-400 mg by mouth every 8 (eight) hours as needed (for arthritis in the feet).     losartan  (COZAAR ) 25 MG tablet Take 1 tablet (25 mg total) by mouth daily. 90 tablet 3   Magnesium  125 MG CAPS One  capsule at bedtime. po 90 capsule 3   Nebivolol  HCl 20 MG TABS TABLET BY MOUTH TWICE A DAY IN THE MORNING AND AT BEDTIME 180 tablet 2   niacin  500 MG tablet Take 500 mg by mouth at bedtime.     rosuvastatin  (CRESTOR ) 20 MG tablet TAKE 1 TABLET BY MOUTH DAILY 90 tablet 2   Specialty Vitamins Products (CENTRUM SPECIALIST ENERGY) TABS Take 1 tablet by mouth daily with breakfast.     tadalafil (CIALIS) 5 MG tablet Take 5 mg by mouth every evening.     Testosterone  40.5 MG/2.5GM (1.62%) GEL Place 4 Pump onto the skin daily. Apply 4 pumps to each shoulder every morning     traZODone  (DESYREL ) 50 MG tablet Take 1.5 tablets (75 mg total) by mouth at bedtime. 135 tablet 1   Turmeric 500 MG CAPS Take 500 mg by mouth daily.     vitamin E 400 UNIT capsule Take 400 Units by mouth at bedtime.      No current facility-administered medications for this encounter.    ROS- All systems are reviewed and negative except as per the HPI above.  Physical Exam: Vitals:   01/28/24 0909  BP: 112/64  Pulse: (!) 50  Weight: 64.9 kg  Height: 5' 7 (1.702 m)    GEN: Well nourished, well developed in no acute distress CARDIAC: Regular rate and rhythm, no  murmurs, rubs, gallops RESPIRATORY:  Clear to auscultation without rales, wheezing or rhonchi  ABDOMEN: Soft, non-tender, non-distended EXTREMITIES:  No edema; No deformity    Wt Readings from Last 3 Encounters:  01/28/24 64.9 kg  12/22/23 64 kg  10/13/23 64.4 kg    EKG today demonstrates  SB, 1st degree AV block Vent. rate 50 BPM PR interval 218 ms QRS duration 80 ms QT/QTcB 458/417 ms   Echo 08/28/20 demonstrated  1. Aorta measurements are stable compared with prior study 09/07/2019. Aortic dilatation noted. Aneurysm of the aortic root, measuring 51 mm. 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   2. Tricuspid aortic valve (clearly tricuspid  on TEE 2016). Mild to  moderate eccentric AI related to incomplete leaflet closure from aortic  root aneurysm. The aortic valve is tricuspid. Aortic valve regurgitation  is mild to moderate. No aortic stenosis is present.   3. Left ventricular ejection fraction, by estimation, is 60 to 65%. The  left ventricle has normal function. The left ventricle has no regional  wall motion abnormalities. There is mild concentric left ventricular  hypertrophy. Left ventricular diastolic parameters are indeterminate.   4. Right ventricular systolic function is normal. The right ventricular  size is normal. There is normal pulmonary artery systolic pressure. The estimated right ventricular systolic pressure is 32.2 mmHg.   5. The mitral valve is grossly normal. No evidence of mitral valve  regurgitation. No evidence of mitral stenosis.   6. The inferior vena cava is normal in size with greater than 50%  respiratory variability, suggesting right atrial pressure of 3 mmHg.   Comparison(s): No significant change from prior study.   Epic records are reviewed at length today  CHA2DS2-VASc Score = 5  The patient's score is based upon: CHF History: 1 HTN History: 1 Diabetes History: 0 Stroke History: 0 Vascular Disease History: 1 Age Score:  2 Gender Score: 0       ASSESSMENT AND PLAN: Persistent Atrial Fibrillation/atrial flutter The patient's CHA2DS2-VASc score is 5, indicating a 7.2% annual risk of stroke.   S/p flutter ablation 09/29/19 and afib ablation 08/02/21 Patient appears to be maintaining SR.  If he has significant recurrence of afib, he would be a candidate for PFA. Continue Eliquis  5 mg BID Continue nebivolol  20 mg BID  Secondary Hypercoagulable State (ICD10:  D68.69) The patient is at significant risk for stroke/thromboembolism based upon his CHA2DS2-VASc Score of 5.  Continue Apixaban  (Eliquis ). No bleeding issues.   OSA  Encouraged nightly CPAP  HTN Stable on current regimen  CAD No anginal symptoms Followed by Dr Shlomo  Chronic HFpEF EF 60-65% GDMT per primary cardiology team Fluid status appears stable today    Follow up in the AF clinic in one year.    Daril Kicks PA-C Afib Clinic [facility] [address] [place], [place] [address] [phone] 01/28/2024 9:42 AM

## 2024-02-26 ENCOUNTER — Other Ambulatory Visit: Payer: Self-pay | Admitting: Surgery

## 2024-02-26 DIAGNOSIS — I7121 Aneurysm of the ascending aorta, without rupture: Secondary | ICD-10-CM

## 2024-02-26 DIAGNOSIS — I712 Thoracic aortic aneurysm, without rupture, unspecified: Secondary | ICD-10-CM

## 2024-03-09 ENCOUNTER — Ambulatory Visit (HOSPITAL_COMMUNITY)

## 2024-03-15 ENCOUNTER — Ambulatory Visit (HOSPITAL_COMMUNITY)
Admission: RE | Admit: 2024-03-15 | Discharge: 2024-03-15 | Disposition: A | Discharge status: Home / Self Care | Source: Ambulatory Visit | Attending: Surgery | Admitting: Surgery

## 2024-03-15 DIAGNOSIS — I2510 Atherosclerotic heart disease of native coronary artery without angina pectoris: Secondary | ICD-10-CM | POA: Diagnosis not present

## 2024-03-15 DIAGNOSIS — I7121 Aneurysm of the ascending aorta, without rupture: Secondary | ICD-10-CM | POA: Insufficient documentation

## 2024-03-15 DIAGNOSIS — I700 Atherosclerosis of aorta: Secondary | ICD-10-CM | POA: Diagnosis not present

## 2024-03-15 DIAGNOSIS — J439 Emphysema, unspecified: Secondary | ICD-10-CM | POA: Insufficient documentation

## 2024-03-15 DIAGNOSIS — I712 Thoracic aortic aneurysm, without rupture, unspecified: Secondary | ICD-10-CM

## 2024-03-15 MED ORDER — IOHEXOL 350 MG/ML SOLN
75.0000 mL | Freq: Once | INTRAVENOUS | Status: AC | PRN
  Administered 2024-03-15: 75 mL via INTRAVENOUS

## 2024-04-14 ENCOUNTER — Encounter: Payer: Self-pay | Admitting: Surgery

## 2024-04-14 ENCOUNTER — Ambulatory Visit: Attending: Surgery | Admitting: Surgery

## 2024-04-14 VITALS — BP 144/68 | HR 58 | Resp 20 | Ht 67.0 in | Wt 142.0 lb

## 2024-04-14 DIAGNOSIS — I7121 Aneurysm of the ascending aorta, without rupture: Secondary | ICD-10-CM | POA: Diagnosis present

## 2024-04-14 NOTE — Progress Notes (Signed)
 [address], [name] [address]             [phone]    HPI:  The patient is a 78 year old active gentleman with a history of hypertension, hyperlipidemia, stage III chronic kidney disease, coronary artery disease status post CABG in 1998 in [facility] [place] , and questionable history of bicuspid aortic valve with a dilated aortic root that has been followed by Dr. Theresa at [facility].  He was admitted to [facility] in 2020 with NSTEMI and cardiac catheterization in 08/2018 showed an occluded left radial artery graft to the RCA with a patent LIMA to the LAD with left-to-right collaterals.  He was continued on medical therapy and has been followed by Dr. Shlomo for his cardiology care.  CTA of the chest in 05/2019 had shown a 4.7 cm aortic root.  He underwent ablation for atrial flutter in March 2021 and has been followed by Dr. Inocencio.  He underwent repeat ablation on 08/02/2021.  He was seen by one of our PA's in March 2023 because he decided that he would like to establish all of his medical care in [place].  CTA of the chest at that time showed a 4.8-5 cm aortic root and 4.7 cm fusiform ascending aortic aneurysm that has been stable dating back to at least 2020.  He had an echocardiogram showing an indeterminate number of valve cusps with mild aortic insufficiency and normal LV dimensions and systolic function.  The aortic root on echo was noted to be 4.6 cm at the sinus level with an ascending aorta of 4.7 cm.  [place] 25 and CTA of the chest at that time showed the aortic root was stable at 4.7 cm but the ascending aortic aneurysm had increased from 4.6 x 4.5 cm to 4.8 x 4.9 cm.  He was therefore scheduled to follow-up in 6 months with me.  He continues to feel well without chest pain or shortness of breath.  Current Outpatient Medications  Medication Sig Dispense Refill   ALPRAZolam  (XANAX  XR) 1 MG 24 hr tablet Take 1 tablet (1 mg total) by mouth daily. 90 tablet 1    amLODipine  (NORVASC ) 5 MG tablet TAKE 1 TABLET BY MOUTH DAILY 90 tablet 2   apixaban  (ELIQUIS ) 5 MG TABS tablet Take 1 tablet (5 mg total) by mouth 2 (two) times daily. 60 tablet 11   Ascorbic Acid (VITAMIN C) 1000 MG tablet Take 1,000 mg by mouth daily.     aspirin -acetaminophen -caffeine  (EXCEDRIN MIGRAINE) 250-250-65 MG tablet Take by mouth as needed for headache.     butalbital -aspirin -caffeine -codeine  (FIORINAL WITH CODEINE ) 50-325-40-30 MG capsule Take 1 capsule by mouth every 4 (four) hours as needed for pain. 30 capsule 1   diazepam  (VALIUM ) 5 MG tablet Take 2.5-5 mg by mouth 2 (two) times daily as needed for anxiety.      furosemide  (LASIX ) 20 MG tablet TAKE 1 TABLET BY MOUTH DAILY 90 tablet 2   ibuprofen (ADVIL,MOTRIN) 200 MG tablet Take 200-400 mg by mouth every 8 (eight) hours as needed (for arthritis in the feet).     losartan  (COZAAR ) 25 MG tablet Take 1 tablet (25 mg total) by mouth daily. 90 tablet 3   Magnesium  125 MG CAPS One  capsule at bedtime. po 90 capsule 3   Nebivolol  HCl 20 MG TABS TABLET BY MOUTH TWICE A DAY IN THE MORNING AND AT BEDTIME 180 tablet 2   niacin  500 MG tablet Take 500  mg by mouth at bedtime.     rosuvastatin  (CRESTOR ) 20 MG tablet TAKE 1 TABLET BY MOUTH DAILY 90 tablet 2   Specialty Vitamins Products (CENTRUM SPECIALIST ENERGY) TABS Take 1 tablet by mouth daily with breakfast.     tadalafil (CIALIS) 5 MG tablet Take 5 mg by mouth every evening.     Testosterone  40.5 MG/2.5GM (1.62%) GEL Place 4 Pump onto the skin daily. Apply 4 pumps to each shoulder every morning     traZODone  (DESYREL ) 50 MG tablet Take 1.5 tablets (75 mg total) by mouth at bedtime. 135 tablet 1   Turmeric 500 MG CAPS Take 500 mg by mouth daily.     vitamin E 400 UNIT capsule Take 400 Units by mouth at bedtime.      No current facility-administered medications for this visit.     Physical Exam: BP (!) 144/68   Pulse (!) 58   Resp 20   Ht 5' 7 (1.702 m)   Wt 142 lb (64.4 kg)    SpO2 99% Comment: RA  BMI 22.24 kg/m  He looks well. Cardiac exam shows regular rate and rhythm with normal heart sounds.  There is no murmur. Lungs are clear. There is no peripheral edema.   Diagnostic Tests:  Narrative & Impression  CLINICAL DATA:  Aortic aneurysm.   EXAM: CT ANGIOGRAPHY CHEST WITH CONTRAST   TECHNIQUE: Multidetector CT imaging of the chest was performed using the standard protocol during bolus administration of intravenous contrast. Multiplanar CT image reconstructions and MIPs were obtained to evaluate the vascular anatomy.   RADIATION DOSE REDUCTION: This exam was performed according to the departmental dose-optimization program which includes automated exposure control, adjustment of the mA and/or kV according to patient size and/or use of iterative reconstruction technique.   CONTRAST:  75mL OMNIPAQUE  IOHEXOL  350 MG/ML SOLN   COMPARISON:  09/29/2023.   FINDINGS: Cardiovascular: Atherosclerotic calcification of the aorta and coronary arteries. Ascending aorta measures 4.7 cm (coronal image 106), stable. Enlarged pulmonic trunk and heart. No pericardial effusion.   Mediastinum/Nodes: Air in the esophagus can be seen with dysmotility. No pathologically enlarged mediastinal, hilar or axillary lymph nodes.   Lungs/Pleura: Centrilobular and paraseptal emphysema. Somewhat upper and midlung zone predominant subpleural reticular densities, ground-glass and possible bronchiolectasis. No pleural fluid. Airway is unremarkable.   Upper Abdomen: Low-attenuation lesion in the left kidney. No specific follow-up necessary. Visualized portions of the liver, adrenal glands, kidneys, spleen, pancreas, stomach and bowel are otherwise grossly unremarkable. No upper abdominal adenopathy.   Musculoskeletal: Degenerative changes in the spine.   Review of the MIP images confirms the above findings.   IMPRESSION: 1. 4.7 cm ascending aortic aneurysm, stable.  Ascending thoracic aortic aneurysm. Recommend semi-annual imaging followup by CTA or MRA and referral to cardiothoracic surgery if not already obtained. This recommendation follows 2010 ACCF/AHA/AATS/ACR/ASA/SCA/SCAI/SIR/STS/SVM Guidelines for the Diagnosis and Management of Patients With Thoracic Aortic Disease. Circulation. 2010; 121: Z733-z630. Aortic aneurysm NOS (ICD10-I71.9). 2. Somewhat upper and midlung zone predominant subpleural reticular densities, ground-glass and possible bronchiolectasis, findings which raise concern for interstitial lung abnormality or interstitial lung disease. If further evaluation is desired, high-resolution chest CT without contrast could be performed. 3. Aortic atherosclerosis (ICD10-I70.0). Coronary artery calcification. 4. Enlarged pulmonic trunk, indicative of pulmonary arterial hypertension. 5.  Emphysema (ICD10-J43.9).     Electronically Signed   By: Newell Eke M.D.   On: 03/15/2024 09:44      Impression:  His CT of the chest shows a 4.9 cm  aortic root diameter and 4.7 cm fusiform ascending aortic aneurysm.  This appears stable compared to his last CTA in March 2025. His aneurysm is still well below the surgical threshold of 5.5 cm in a patient with a trileaflet aortic valve.  His last echocardiogram in February 2024 showed a trileaflet aortic valve with mild insufficiency.  I do not hear any murmur.  I reviewed the CT images with him and answered all of his questions.  I stressed the importance of continued good blood pressure control in preventing further enlargement and acute aortic dissection.  I advised him against doing any heavy lifting that may require a Valsalva maneuver and could suddenly raise his blood pressure to high levels.   Plan:  I will see him back in 6 months with a CTA of the chest for aortic surveillance.  I spent 20 minutes performing this established patient evaluation and > 50% of this time was spent face to  face counseling and coordinating the care of this patient's aortic aneurysm.    Dorise MARLA Fellers, MD [facility] [phone]

## 2024-05-13 ENCOUNTER — Ambulatory Visit: Admitting: Gastroenterology

## 2024-06-24 ENCOUNTER — Encounter: Payer: Self-pay | Admitting: Adult Health

## 2024-06-24 ENCOUNTER — Ambulatory Visit: Admitting: Adult Health

## 2024-06-24 VITALS — BP 114/55 | HR 52 | Ht 67.0 in | Wt 152.4 lb

## 2024-06-24 DIAGNOSIS — R252 Cramp and spasm: Secondary | ICD-10-CM

## 2024-06-24 DIAGNOSIS — G4733 Obstructive sleep apnea (adult) (pediatric): Secondary | ICD-10-CM | POA: Diagnosis not present

## 2024-06-24 DIAGNOSIS — G4700 Insomnia, unspecified: Secondary | ICD-10-CM | POA: Diagnosis not present

## 2024-06-24 DIAGNOSIS — G43E01 Chronic migraine with aura, not intractable, with status migrainosus: Secondary | ICD-10-CM

## 2024-06-24 NOTE — Progress Notes (Signed)
 PATIENT: [name] DOB: 10/27/1945  REASON FOR VISIT: follow up HISTORY FROM: patient PRIMARY NEUROLOGIST: Dr. Chalice  Chief Complaint  Patient presents with   Follow-up    Pt in 18 alone Pt here for cpap /migraine f/u Pt states increased cramping in legs and feet Pt states migraines stable      HISTORY OF PRESENT ILLNESS: Today 06/24/24:  [name] is a 78 y.o. male with a history of OSA on cpap, migraine headaches, insomnia and leg cramping. Returns today for follow-up.  He reports in regards to the CPAP he is doing well.  He uses a fullface mask.  Continues to find it beneficial.  His migraines have been under relatively good control.  Having approximately 4 migraines a month.  With Fioricet his headaches resolved in 30 to 45 minutes.  Remains on extended release Xanax  and trazodone  for insomnia which is beneficial.  Still reports leg cramping.  He has been taking magnesium  and trying tonic water.  Not sure how beneficial its been.  He is currently on Lasix  but recently had blood work through his PCP in which electrolytes was in normal range.  He returns today for an evaluation.      06/25/23: [name] is a 78 y.o. male with a history of OSA on CPAP and insomnia. Returns today for follow-up. Reports that CPAP is working well. No issues.  Continues to use trazodone  and Xanax  at bedtime for insomnia.  He has been on this combination for years and it continues to work well.  He reports that his headaches are rare.  Fioricet continues to work well when he does get a headache.        05/29/22: [name] is a 78 year old male with a history of migraine, insomnia and obstructive sleep apnea on CPAP.  He returns today for follow-up.  Migraines: Controlled. Using Fioricet with codeine  4 times a month.   Insomnia: xanax  nightly- reports that he has never used xanax  during the day.  Continues to take trazodone  75 mg at bedtime.  He does feel that his sleep has  improved he is thinking about reducing his dose to 50 mg  OSA on CPAP: Download below.  Good compliance.    07/09/21: [name] is a 78 year old male with a history of migraines, insomnia, obstructive sleep apnea on CPAP.  He returns today for follow-up.  Migraines: Controlled. Using Fioricet with codeine   6-8 times a month.   Insomnia: xanax  nightly, trazodone  50 mg helping but feels like an increased dose would be helpful.   OSA on CPAP: Download below. Sometimes mask will leak but he adjust it.   Patient has A.fib. has several tests coming up: cardioversion scheduled for 12/22, Cardiac scan on 1/4 and ablation 1/12 and Echo 2/7.    REVIEW OF SYSTEMS: Out of a complete 14 system review of symptoms, the patient complains only of the following symptoms, and all other reviewed systems are negative.   ALLERGIES: Allergies  Allergen Reactions   Dog Epithelium (Canis Lupus Familiaris) Itching, Anxiety, Palpitations, Other (See Comments), Cough and Shortness Of Breath    Other reaction(s): Respiratory Distress (ALLERGY/intolerance), Wheezing (ALLERGY/intolerance)   Ciprofloxacin Other (See Comments)    Avoid as patient has an aortic aneurysm and this drug can increase risk of Aortic Dissection   Mushroom Extract Complex (Obsolete) Nausea Only and Other (See Comments)    Severe vertigo and headaches    HOME MEDICATIONS: Outpatient Medications Prior  to Visit  Medication Sig Dispense Refill   ALPRAZolam  (XANAX  XR) 1 MG 24 hr tablet Take 1 tablet (1 mg total) by mouth daily. 90 tablet 1   amLODipine  (NORVASC ) 5 MG tablet TAKE 1 TABLET BY MOUTH DAILY 90 tablet 2   apixaban  (ELIQUIS ) 5 MG TABS tablet Take 1 tablet (5 mg total) by mouth 2 (two) times daily. 60 tablet 11   Ascorbic Acid (VITAMIN C) 1000 MG tablet Take 1,000 mg by mouth daily.     aspirin -acetaminophen -caffeine  (EXCEDRIN MIGRAINE) 250-250-65 MG tablet Take by mouth as needed for headache.      butalbital -aspirin -caffeine -codeine  (FIORINAL WITH CODEINE ) 50-325-40-30 MG capsule Take 1 capsule by mouth every 4 (four) hours as needed for pain. 30 capsule 1   diazepam  (VALIUM ) 5 MG tablet Take 2.5-5 mg by mouth 2 (two) times daily as needed for anxiety.      furosemide  (LASIX ) 20 MG tablet TAKE 1 TABLET BY MOUTH DAILY 90 tablet 2   ibuprofen (ADVIL,MOTRIN) 200 MG tablet Take 200-400 mg by mouth every 8 (eight) hours as needed (for arthritis in the feet).     losartan  (COZAAR ) 25 MG tablet Take 1 tablet (25 mg total) by mouth daily. 90 tablet 3   Magnesium  125 MG CAPS One  capsule at bedtime. po 90 capsule 3   Nebivolol  HCl 20 MG TABS TABLET BY MOUTH TWICE A DAY IN THE MORNING AND AT BEDTIME 180 tablet 2   niacin  500 MG tablet Take 500 mg by mouth at bedtime.     rosuvastatin  (CRESTOR ) 20 MG tablet TAKE 1 TABLET BY MOUTH DAILY 90 tablet 2   Specialty Vitamins Products (CENTRUM SPECIALIST ENERGY) TABS Take 1 tablet by mouth daily with breakfast.     tadalafil (CIALIS) 5 MG tablet Take 5 mg by mouth every evening.     Testosterone  40.5 MG/2.5GM (1.62%) GEL Place 4 Pump onto the skin daily. Apply 4 pumps to each shoulder every morning     traZODone  (DESYREL ) 50 MG tablet Take 1.5 tablets (75 mg total) by mouth at bedtime. 135 tablet 1   Turmeric 500 MG CAPS Take 500 mg by mouth daily.     vitamin E 400 UNIT capsule Take 400 Units by mouth at bedtime.      No facility-administered medications prior to visit.    PAST MEDICAL HISTORY: Past Medical History:  Diagnosis Date   Anal fissure    Aortic insufficiency    Arthritis    Atrial flutter (HCC)    Bicuspid aortic valve    with mild to moderate AR by echo 08/2019   BPH (benign prostatic hyperplasia)    CAD (coronary artery disease)    s/p CABG [place] 1998   Cataract    bilateral-removed   CKD (chronic kidney disease), stage III (HCC)    Depression    Dilated aortic root    History of blood transfusion 1998   due to  cardiac bypass surgery   Hyperlipidemia    Hypertension    Hypogonadism in male    Kidney stones    Migraine    Mild carotid artery disease 2009   Myocardial infarction [facility])    PVC's (premature ventricular contractions)    Retinal hemorrhage 11/04/2014   Sleep apnea with use of continuous positive airway pressure (CPAP)    Thoracic aortic aneurysm (TAA)    51mm aoartic root and 47mm ascending aorta by echo 08/2022    PAST SURGICAL HISTORY: Past Surgical History:  Procedure Laterality  Date   A-FLUTTER ABLATION N/A 09/29/2019   Procedure: A-FLUTTER ABLATION;  Surgeon: Inocencio Soyla Lunger, MD;  Location: [facility] INVASIVE CV LAB;  Service: Cardiovascular;  Laterality: N/A;   ATRIAL FIBRILLATION ABLATION N/A 08/02/2021   Procedure: ATRIAL FIBRILLATION ABLATION;  Surgeon: Inocencio Soyla Lunger, MD;  Location: [facility] INVASIVE CV LAB;  Service: Cardiovascular;  Laterality: N/A;   CARDIOVERSION N/A 08/10/2019   Procedure: CARDIOVERSION;  Surgeon: Loni Soyla LABOR, MD;  Location: [facility] ENDOSCOPY;  Service: Cardiovascular;  Laterality: N/A;   CARDIOVERSION N/A 05/21/2021   Procedure: CARDIOVERSION;  Surgeon: Delford Maude BROCKS, MD;  Location: [facility] ENDOSCOPY;  Service: Cardiovascular;  Laterality: N/A;   CARDIOVERSION N/A 07/12/2021   Procedure: CARDIOVERSION;  Surgeon: Jeffrie Oneil BROCKS, MD;  Location: [facility] ENDOSCOPY;  Service: Cardiovascular;  Laterality: N/A;   cataracts     COLONOSCOPY     CORONARY ARTERY BYPASS GRAFT  1998   LEFT HEART CATH AND CORONARY ANGIOGRAPHY N/A 08/24/2018   Procedure: LEFT HEART CATH AND CORONARY ANGIOGRAPHY;  Surgeon: Court Dorn PARAS, MD;  Location: [facility] INVASIVE CV LAB;  Service: Cardiovascular;  Laterality: N/A;   TEE WITHOUT CARDIOVERSION N/A 01/19/2015   Procedure: TRANSESOPHAGEAL ECHOCARDIOGRAM (TEE);  Surgeon: Wilbert JONELLE Bihari, MD;  Location: [facility] ENDOSCOPY;  Service: Cardiovascular;  Laterality: N/A;   TOOTH EXTRACTION     VARICOSE VEIN SURGERY  1984   pt denies    FAMILY  HISTORY: Family History  Problem Relation Age of Onset   Arthritis Mother        deceased   Hyperlipidemia Mother    Diabetes Mother    Arthritis Father    Hyperlipidemia Father    Heart disease Father 37   Stroke Father 31       deceased   Hypertension Father    Diabetes Father    Kidney disease Paternal Grandfather    Colon cancer Neg Hx    Colon polyps Neg Hx    Esophageal cancer Neg Hx    Stomach cancer Neg Hx    Rectal cancer Neg Hx    Sleep apnea Neg Hx     SOCIAL HISTORY: Social History   Socioeconomic History   Marital status: Divorced    Spouse name: Not on file   Number of children: 0   Years of education: Not on file   Highest education level: Not on file  Occupational History   Occupation: retired  Tobacco Use   Smoking status: Former    Current packs/day: 0.00    Types: Cigarettes    Start date: 07/22/1966    Quit date: 07/22/1986    Years since quitting: 37.9   Smokeless tobacco: Never   Tobacco comments:    Former smoker 05/09/2021  Vaping Use   Vaping status: Never Used  Substance and Sexual Activity   Alcohol use: Yes    Alcohol/week: 1.0 standard drink of alcohol    Types: 1 Glasses of wine per week    Comment: 0-1 daily   Drug use: No   Sexual activity: Yes  Other Topics Concern   Not on file  Social History Narrative   Divorced   Publishing rights manager- retired   No children   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   Overall Physicist, Medical Strain (CARDIA)  Difficulty of Paying Living Expenses: Somewhat hard  Food Insecurity: Low Risk  (05/28/2024)   [facility] from [facility]   Hunger Vital Sign    Within the past 12 months, you worried that your food would run out before you got money to buy more: Never  true    Within the past 12 months, the food you bought just didn't last and you didn't have money to get more. : Never true  Transportation Needs: No Transportation Needs (05/28/2024)   Received from [facility]    In the past 12 months, has lack of reliable transportation kept you from medical appointments, meetings, work or from getting things needed for daily living? : No  Physical Activity: Sufficiently Active (04/07/2021)   Received from [facility] visits prior to 09/21/2022.   Exercise Vital Sign    On average, how many days per week do you engage in moderate to strenuous exercise (like a brisk walk)?: 5 days    On average, how many minutes do you engage in exercise at this level?: 70 min  Stress: Stress Concern Present (04/07/2021)   Received from [facility] visits prior to 09/21/2022.   [employer] of Occupational Health - Occupational Stress Questionnaire    Feeling of Stress : Rather much  Social Connections: Moderately Integrated (04/07/2021)   Received from [facility] visits prior to 09/21/2022.   Social Connection and Isolation Panel    In a typical week, how many times do you talk on the phone with family, friends, or neighbors?: Twice a week    How often do you get together with friends or relatives?: Twice a week    How often do you attend church or religious services?: 1 to 4 times per year    Do you belong to any clubs or organizations such as church groups, unions, fraternal or athletic groups, or school groups?: Yes    How often do you attend meetings of the clubs or organizations you belong to?: 1 to 4 times per year    Are you married, widowed, divorced, separated, never married, or living with a partner?: Divorced  Intimate Partner Violence: Not At Risk (04/07/2021)   Received from [facility] visits prior to 09/21/2022.   Humiliation, Afraid, Rape, and Kick  questionnaire    Within the last year, have you been afraid of your partner or ex-partner?: No    Within the last year, have you been humiliated or emotionally abused in other ways by your partner or ex-partner?: No    Within the last year, have you been kicked, hit, slapped, or otherwise physically hurt by your partner or ex-partner?: No    Within the last year, have you been raped or forced to have any kind of sexual activity by your partner or ex-partner?: No      PHYSICAL EXAM  Vitals:   06/24/24 0924  BP: (!) 114/55  Pulse: (!) 52  Weight: 152 lb 6.4 oz (69.1 kg)  Height: 5' 7 (1.702 m)   Body mass index is 23.87 kg/m.  Generalized: Well developed, in no acute distress    Neurological examination  Mentation: Alert oriented to time, place, history taking. Follows all commands speech and language fluent Cranial nerve II-XII: Extraocular movements were full, visual field were full on confrontational test Head turning and shoulder shrug  were normal and symmetric. Motor: The motor testing reveals 5 over 5 strength  of all 4 extremities. Good symmetric motor tone is noted throughout.  Sensory: Sensory testing is intact to soft touch on all 4 extremities. No evidence of extinction is noted.  Gait and station: Gait is normal.    DIAGNOSTIC DATA (LABS, IMAGING, TESTING) - I reviewed patient records, labs, notes, testing and imaging myself where available.  Lab Results  Component Value Date   WBC 10.3 04/06/2023   HGB 15.0 04/06/2023   HCT 44.1 04/06/2023   MCV 105.0 (H) 04/06/2023   PLT 244 04/06/2023      Component Value Date/Time   NA 147 (H) 08/07/2023 1626   K 4.2 08/07/2023 1626   CL 108 (H) 08/07/2023 1626   CO2 23 08/07/2023 1626   GLUCOSE 107 (H) 08/07/2023 1626   GLUCOSE 161 (H) 04/06/2023 2229   BUN 21 08/07/2023 1626   CREATININE 1.46 (H) 08/07/2023 1626   CALCIUM  8.8 08/07/2023 1626   PROT 6.1 08/07/2023 1626   ALBUMIN 4.2 08/07/2023 1626   AST 31  08/07/2023 1626   ALT 22 08/07/2023 1626   ALKPHOS 99 08/07/2023 1626   BILITOT 0.5 08/07/2023 1626   GFRNONAA 40 (L) 04/06/2023 2229   GFRAA 64 07/27/2020 0954   Lab Results  Component Value Date   CHOL 85 (L) 08/07/2023   HDL 45 08/07/2023   LDLCALC 25 08/07/2023   TRIG 69 08/07/2023   CHOLHDL 1.9 08/07/2023   Lab Results  Component Value Date   HGBA1C 5.9 (H) 08/22/2018   Lab Results  Component Value Date   VITAMINB12 721 09/24/2018   Lab Results  Component Value Date   TSH 2.760 04/27/2021      ASSESSMENT AND PLAN 78 y.o. year old male  has a past medical history of Anal fissure, Aortic insufficiency, Arthritis, Atrial flutter (HCC), Bicuspid aortic valve, BPH (benign prostatic hyperplasia), CAD (coronary artery disease), Cataract, CKD (chronic kidney disease), stage III (HCC), Depression, Dilated aortic root, History of blood transfusion (1998), Hyperlipidemia, Hypertension, Hypogonadism in male, Kidney stones, Migraine, Mild carotid artery disease (2009), Myocardial infarction (HCC), PVC's (premature ventricular contractions), Retinal hemorrhage (11/04/2014), Sleep apnea with use of continuous positive airway pressure (CPAP), and Thoracic aortic aneurysm (TAA). here with:  OSA on CPAP  - CPAP compliance excellent - Good treatment of AHI  - Encourage patient to use CPAP nightly and > 4 hours each night  2.  Migraine headaches  -Controlled -Continue Fioricet with codeine - cautioned about rebound headaches  3.  Insomnia  -Continue Xanax  XR 1 mg at bedtime -Continue trazodone   75 mg at bedtime  4. Nocturnal muscle cramps  - blood work by PCP unremarkable - Continue Magnesium   - advised that per Open Evidence there were studies that suggest benefit from complex B vitamin. He is currently taking a mult-vitamin- I advised that he could compare dosages but should take both if they contain the same thing.  FU 6 months with Dr. Chalice Duwaine Russell, MSN, NP-C  06/24/2024, 9:32 AM [facility] will give [address] [place], [place] [address] [phone]  The patient's condition requires frequent monitoring and adjustments in the treatment plan, reflecting the ongoing complexity of care.  This provider is the continuing focal point for all needed services for this condition.

## 2024-06-24 NOTE — Patient Instructions (Signed)
 Your Plan:  Continue CPAP Continue Fioricet for migraines Continue Xanax  and trazodone  for sleep As discussed can consider a B complex vitamin however since you are taking a multivitamin please look at the dosages.  If they contain the same medications do not need to take both.     Thank you for coming to see us  at [facility]. I hope we have been able to provide you high quality care today.  You may receive a patient satisfaction survey over the next few weeks. We would appreciate your feedback and comments so that we may continue to improve ourselves and the health of our patients.

## 2024-07-01 ENCOUNTER — Other Ambulatory Visit: Payer: Self-pay | Admitting: Neurology

## 2024-07-02 ENCOUNTER — Encounter: Payer: Self-pay | Admitting: Cardiology

## 2024-07-05 ENCOUNTER — Other Ambulatory Visit: Payer: Self-pay | Admitting: Adult Health

## 2024-07-05 MED ORDER — ALPRAZOLAM ER 1 MG PO TB24: 1.0000 mg | ORAL_TABLET | Freq: Every day | ORAL | 1 refills | Status: DC

## 2024-07-05 MED ORDER — BUTALBITAL-ASA-CAFF-CODEINE 50-325-40-30 MG PO CAPS: 1.0000 | ORAL_CAPSULE | ORAL | 1 refills | Status: AC | PRN

## 2024-07-05 NOTE — Telephone Encounter (Signed)
 Pt reports he has been 11 days without his ALPRAZolam  (XANAX  XR) 1 MG 24 hr tablet   & butalbital -aspirin -caffeine -codeine  (FIORINAL WITH CODEINE ) 50-325-40-30 MG capsule He was told Megan, NP would call in both medications when he was last seen.  Pt's pharmacy is [facility] [id number] Pt has expressed that he is upset that these medications have not been  called in yet and will call every half hour until they have been called in. Pt informed he needs to allow time for provider to sign off on refill request

## 2024-07-05 NOTE — Telephone Encounter (Signed)
 Medication sent. I called patient- apologized for the delay. He was appreciative.

## 2024-07-26 ENCOUNTER — Other Ambulatory Visit (HOSPITAL_COMMUNITY): Payer: Self-pay | Admitting: Physician Assistant

## 2024-07-29 ENCOUNTER — Other Ambulatory Visit: Payer: Self-pay | Admitting: Neurology

## 2024-07-29 ENCOUNTER — Other Ambulatory Visit: Payer: Self-pay | Admitting: Cardiology

## 2024-08-12 ENCOUNTER — Telehealth: Payer: Self-pay

## 2024-08-12 NOTE — Telephone Encounter (Signed)
 [name] called the emergency overnight line due to afib with rvr with HR into the 140s. He states he is having associated chest pressure and palpitations. He is on max dose nebivolol  so I instructed him to present to the nearest ED for further evaluation. He agreed.

## 2024-08-13 ENCOUNTER — Emergency Department (HOSPITAL_COMMUNITY)

## 2024-08-13 ENCOUNTER — Other Ambulatory Visit: Payer: Self-pay

## 2024-08-13 ENCOUNTER — Observation Stay (HOSPITAL_COMMUNITY)

## 2024-08-13 ENCOUNTER — Encounter (HOSPITAL_COMMUNITY): Payer: Self-pay | Admitting: Anesthesiology

## 2024-08-13 ENCOUNTER — Observation Stay (HOSPITAL_COMMUNITY)
Admission: EM | Admit: 2024-08-13 | Discharge: 2024-08-13 | Disposition: A | Discharge status: Home / Self Care | Attending: Family Medicine | Admitting: Family Medicine

## 2024-08-13 ENCOUNTER — Encounter (HOSPITAL_COMMUNITY): Payer: Self-pay

## 2024-08-13 ENCOUNTER — Encounter (HOSPITAL_COMMUNITY)
Admission: EM | Disposition: A | Discharge status: Home / Self Care | Payer: Self-pay | Source: Home / Self Care | Attending: Emergency Medicine

## 2024-08-13 DIAGNOSIS — N182 Chronic kidney disease, stage 2 (mild): Secondary | ICD-10-CM | POA: Diagnosis not present

## 2024-08-13 DIAGNOSIS — E291 Testicular hypofunction: Secondary | ICD-10-CM | POA: Insufficient documentation

## 2024-08-13 DIAGNOSIS — Z955 Presence of coronary angioplasty implant and graft: Secondary | ICD-10-CM | POA: Diagnosis not present

## 2024-08-13 DIAGNOSIS — Z7901 Long term (current) use of anticoagulants: Secondary | ICD-10-CM | POA: Insufficient documentation

## 2024-08-13 DIAGNOSIS — I480 Paroxysmal atrial fibrillation: Principal | ICD-10-CM | POA: Insufficient documentation

## 2024-08-13 DIAGNOSIS — G4733 Obstructive sleep apnea (adult) (pediatric): Secondary | ICD-10-CM | POA: Diagnosis not present

## 2024-08-13 DIAGNOSIS — I4891 Unspecified atrial fibrillation: Secondary | ICD-10-CM

## 2024-08-13 DIAGNOSIS — I2510 Atherosclerotic heart disease of native coronary artery without angina pectoris: Secondary | ICD-10-CM | POA: Diagnosis not present

## 2024-08-13 DIAGNOSIS — Z79899 Other long term (current) drug therapy: Secondary | ICD-10-CM | POA: Diagnosis not present

## 2024-08-13 DIAGNOSIS — I130 Hypertensive heart and chronic kidney disease with heart failure and stage 1 through stage 4 chronic kidney disease, or unspecified chronic kidney disease: Secondary | ICD-10-CM | POA: Insufficient documentation

## 2024-08-13 DIAGNOSIS — Z8669 Personal history of other diseases of the nervous system and sense organs: Secondary | ICD-10-CM | POA: Diagnosis not present

## 2024-08-13 DIAGNOSIS — F32A Depression, unspecified: Secondary | ICD-10-CM | POA: Diagnosis present

## 2024-08-13 DIAGNOSIS — I10 Essential (primary) hypertension: Secondary | ICD-10-CM | POA: Diagnosis present

## 2024-08-13 DIAGNOSIS — I5031 Acute diastolic (congestive) heart failure: Secondary | ICD-10-CM | POA: Diagnosis not present

## 2024-08-13 DIAGNOSIS — Z87891 Personal history of nicotine dependence: Secondary | ICD-10-CM | POA: Insufficient documentation

## 2024-08-13 DIAGNOSIS — R002 Palpitations: Secondary | ICD-10-CM | POA: Diagnosis present

## 2024-08-13 DIAGNOSIS — E785 Hyperlipidemia, unspecified: Secondary | ICD-10-CM | POA: Diagnosis present

## 2024-08-13 DIAGNOSIS — F418 Other specified anxiety disorders: Secondary | ICD-10-CM | POA: Diagnosis not present

## 2024-08-13 DIAGNOSIS — G4700 Insomnia, unspecified: Secondary | ICD-10-CM | POA: Diagnosis present

## 2024-08-13 LAB — CBC WITH DIFFERENTIAL/PLATELET
Abs Immature Granulocytes: 0.03 K/uL (ref 0.00–0.07)
Abs Immature Granulocytes: 0.03 K/uL (ref 0.00–0.07)
Basophils Absolute: 0 K/uL (ref 0.0–0.1)
Basophils Absolute: 0 K/uL (ref 0.0–0.1)
Basophils Relative: 0 %
Basophils Relative: 0 %
Eosinophils Absolute: 0.2 K/uL (ref 0.0–0.5)
Eosinophils Absolute: 0.2 K/uL (ref 0.0–0.5)
Eosinophils Relative: 2 %
Eosinophils Relative: 2 %
HCT: 37.2 % — ABNORMAL LOW (ref 39.0–52.0)
HCT: 34.7 % — ABNORMAL LOW (ref 39.0–52.0)
Hemoglobin: 12.7 g/dL — ABNORMAL LOW (ref 13.0–17.0)
Hemoglobin: 11.9 g/dL — ABNORMAL LOW (ref 13.0–17.0)
Immature Granulocytes: 0 %
Immature Granulocytes: 0 %
Lymphocytes Relative: 15 %
Lymphocytes Relative: 19 %
Lymphs Abs: 1.1 K/uL (ref 0.7–4.0)
Lymphs Abs: 1.4 K/uL (ref 0.7–4.0)
MCH: 36.3 pg — ABNORMAL HIGH (ref 26.0–34.0)
MCH: 36.4 pg — ABNORMAL HIGH (ref 26.0–34.0)
MCHC: 34.1 g/dL (ref 30.0–36.0)
MCHC: 34.3 g/dL (ref 30.0–36.0)
MCV: 106.3 fL — ABNORMAL HIGH (ref 80.0–100.0)
MCV: 106.1 fL — ABNORMAL HIGH (ref 80.0–100.0)
Monocytes Absolute: 0.6 K/uL (ref 0.1–1.0)
Monocytes Absolute: 0.7 K/uL (ref 0.1–1.0)
Monocytes Relative: 8 %
Monocytes Relative: 9 %
Neutro Abs: 5.4 K/uL (ref 1.7–7.7)
Neutro Abs: 5.1 K/uL (ref 1.7–7.7)
Neutrophils Relative %: 75 %
Neutrophils Relative %: 70 %
Platelets: 190 K/uL (ref 150–400)
Platelets: 186 K/uL (ref 150–400)
RBC: 3.5 MIL/uL — ABNORMAL LOW (ref 4.22–5.81)
RBC: 3.27 MIL/uL — ABNORMAL LOW (ref 4.22–5.81)
RDW: 12 % (ref 11.5–15.5)
RDW: 11.9 % (ref 11.5–15.5)
WBC: 7.3 K/uL (ref 4.0–10.5)
WBC: 7.4 K/uL (ref 4.0–10.5)
nRBC: 0 % (ref 0.0–0.2)
nRBC: 0 % (ref 0.0–0.2)

## 2024-08-13 LAB — LIPID PANEL
Cholesterol: 73 mg/dL (ref 0–200)
HDL: 32 mg/dL — ABNORMAL LOW
LDL Cholesterol: 16 mg/dL (ref 0–99)
Total CHOL/HDL Ratio: 2.3 ratio
Triglycerides: 122 mg/dL
VLDL: 24 mg/dL (ref 0–40)

## 2024-08-13 LAB — BASIC METABOLIC PANEL WITH GFR
Anion gap: 11 (ref 5–15)
BUN: 28 mg/dL — ABNORMAL HIGH (ref 8–23)
CO2: 26 mmol/L (ref 22–32)
Calcium: 8.1 mg/dL — ABNORMAL LOW (ref 8.9–10.3)
Chloride: 103 mmol/L (ref 98–111)
Creatinine, Ser: 1.84 mg/dL — ABNORMAL HIGH (ref 0.61–1.24)
GFR, Estimated: 38 mL/min — ABNORMAL LOW
Glucose, Bld: 135 mg/dL — ABNORMAL HIGH (ref 70–99)
Potassium: 4.1 mmol/L (ref 3.5–5.1)
Sodium: 141 mmol/L (ref 135–145)

## 2024-08-13 LAB — COMPREHENSIVE METABOLIC PANEL WITH GFR
ALT: 24 U/L (ref 0–44)
AST: 34 U/L (ref 15–41)
Albumin: 3.5 g/dL (ref 3.5–5.0)
Alkaline Phosphatase: 109 U/L (ref 38–126)
Anion gap: 8 (ref 5–15)
BUN: 29 mg/dL — ABNORMAL HIGH (ref 8–23)
CO2: 25 mmol/L (ref 22–32)
Calcium: 8 mg/dL — ABNORMAL LOW (ref 8.9–10.3)
Chloride: 106 mmol/L (ref 98–111)
Creatinine, Ser: 1.77 mg/dL — ABNORMAL HIGH (ref 0.61–1.24)
GFR, Estimated: 39 mL/min — ABNORMAL LOW
Glucose, Bld: 123 mg/dL — ABNORMAL HIGH (ref 70–99)
Potassium: 4.4 mmol/L (ref 3.5–5.1)
Sodium: 139 mmol/L (ref 135–145)
Total Bilirubin: 0.2 mg/dL (ref 0.0–1.2)
Total Protein: 5.4 g/dL — ABNORMAL LOW (ref 6.5–8.1)

## 2024-08-13 LAB — MAGNESIUM: Magnesium: 2.4 mg/dL (ref 1.7–2.4)

## 2024-08-13 LAB — PRO BRAIN NATRIURETIC PEPTIDE: Pro Brain Natriuretic Peptide: 2143 pg/mL — ABNORMAL HIGH

## 2024-08-13 LAB — TROPONIN T, HIGH SENSITIVITY
Troponin T High Sensitivity: 32 ng/L — ABNORMAL HIGH (ref 0–19)
Troponin T High Sensitivity: 33 ng/L — ABNORMAL HIGH (ref 0–19)
Troponin T High Sensitivity: 38 ng/L — ABNORMAL HIGH (ref 0–19)

## 2024-08-13 LAB — PROCALCITONIN: Procalcitonin: <0.1 ng/mL

## 2024-08-13 LAB — TSH: TSH: 2.4 u[IU]/mL (ref 0.350–4.500)

## 2024-08-13 MED ORDER — FUROSEMIDE 20 MG PO TABS: 20.0000 mg | ORAL_TABLET | Freq: Every day | ORAL | Status: DC

## 2024-08-13 MED ORDER — ALPRAZOLAM 0.25 MG PO TABS: 0.2500 mg | ORAL_TABLET | Freq: Two times a day (BID) | ORAL | Status: DC | PRN

## 2024-08-13 MED ORDER — DILTIAZEM HCL-DEXTROSE 125-5 MG/125ML-% IV SOLN (PREMIX)
5.0000 mg/h | INTRAVENOUS | Status: DC
  Filled 2024-08-13: qty 125

## 2024-08-13 MED ORDER — SODIUM CHLORIDE 0.9 % IV SOLN: INTRAVENOUS | Status: DC

## 2024-08-13 MED ORDER — LOSARTAN POTASSIUM 50 MG PO TABS: 25.0000 mg | ORAL_TABLET | Freq: Every day | ORAL | Status: DC

## 2024-08-13 MED ORDER — METOPROLOL TARTRATE 5 MG/5ML IV SOLN: 2.5000 mg | INTRAVENOUS | Status: DC | PRN

## 2024-08-13 MED ORDER — APIXABAN 5 MG PO TABS
5.0000 mg | ORAL_TABLET | Freq: Two times a day (BID) | ORAL | Status: DC
  Administered 2024-08-13: 5 mg via ORAL
  Filled 2024-08-13: qty 1

## 2024-08-13 MED ORDER — MAGNESIUM 125 MG PO CAPS: 125.0000 mg | ORAL_CAPSULE | Freq: Every day | ORAL | Status: DC

## 2024-08-13 MED ORDER — DILTIAZEM LOAD VIA INFUSION
10.0000 mg | Freq: Once | INTRAVENOUS | Status: DC
  Filled 2024-08-13: qty 10

## 2024-08-13 MED ORDER — ONDANSETRON HCL 4 MG/2ML IJ SOLN: 4.0000 mg | Freq: Four times a day (QID) | INTRAMUSCULAR | Status: DC | PRN

## 2024-08-13 MED ORDER — MELATONIN 3 MG PO TABS: 3.0000 mg | ORAL_TABLET | Freq: Every evening | ORAL | Status: DC | PRN

## 2024-08-13 MED ORDER — ACETAMINOPHEN 650 MG RE SUPP: 650.0000 mg | Freq: Four times a day (QID) | RECTAL | Status: DC | PRN

## 2024-08-13 MED ORDER — ACETAMINOPHEN 325 MG PO TABS: 650.0000 mg | ORAL_TABLET | Freq: Four times a day (QID) | ORAL | Status: DC | PRN

## 2024-08-13 MED ORDER — ACETAMINOPHEN 325 MG PO TABS: 650.0000 mg | ORAL_TABLET | ORAL | Status: DC | PRN

## 2024-08-13 MED ORDER — NEBIVOLOL HCL 10 MG PO TABS: 20.0000 mg | ORAL_TABLET | Freq: Every day | ORAL | Status: DC

## 2024-08-13 MED ORDER — DIAZEPAM 5 MG PO TABS: 2.5000 mg | ORAL_TABLET | Freq: Two times a day (BID) | ORAL | Status: DC | PRN

## 2024-08-13 MED ORDER — VITAMIN E 45 MG (100 UNIT) PO CAPS
400.0000 [IU] | ORAL_CAPSULE | Freq: Every day | ORAL | Status: DC
  Filled 2024-08-13: qty 4

## 2024-08-13 MED ORDER — ROSUVASTATIN CALCIUM 20 MG PO TABS
20.0000 mg | ORAL_TABLET | Freq: Every day | ORAL | Status: DC
  Administered 2024-08-13: 20 mg via ORAL
  Filled 2024-08-13: qty 1

## 2024-08-13 NOTE — Assessment & Plan Note (Signed)
 With history of paroxysmal atrial fibrillation, and a prior history of 2 ablations -Started on Cardizem drip -titrating for heart rate less than 100 -Continue Eliquis  -Cardiology consulted, appreciate further evaluation and recommendations -Obtain 2D echocardiogram, recycle troponin Chest pain has resolved

## 2024-08-13 NOTE — ED Provider Notes (Addendum)
 " [facility] Provider Note   CSN: [id number] Arrival date & time: 08/13/24  9892     Patient presents with: No chief complaint on file.   [name] is a 79 y.o. male.   The history is provided by the patient and medical records.   79 y.o. M with hx of arthritis, HLP, aortic insufficiency, depression, BPH, coronary artery disease, history of atrial flutter as well as atrial fibrillation, presenting to the ED with palpitations.  States this started around 8:30- 9PM and has been persistent since that time.  EMS reported AFIB with rate 70's-140's.  He was given 500cc bolus with EMS and seemed to convert to a sinus tach.  He states he still feels palpitations, not sure if it is better worse since it began.  Does report some chest pressure.  Denies cough, fever, or recent URI symptoms.  No dizziness or feelings of syncope.  Has been cardioverted a few times and has had ablation x2 with Dr. Inocencio.  Prior to Admission medications  Medication Sig Start Date End Date Taking? Authorizing Provider  ALPRAZolam  (XANAX  XR) 1 MG 24 hr tablet TAKE 1 TABLET BY MOUTH DAILY 07/07/24   Dohmeier, Dedra, MD  ALPRAZolam  (XANAX  XR) 1 MG 24 hr tablet Take 1 tablet (1 mg total) by mouth daily. 07/05/24   Sherryl Bouchard, NP  amLODipine  (NORVASC ) 5 MG tablet TAKE 1 TABLET BY MOUTH DAILY 11/07/23   Shlomo Wilbert SAUNDERS, MD  apixaban  (ELIQUIS ) 5 MG TABS tablet Take 1 tablet (5 mg total) by mouth 2 (two) times daily. 04/27/21   Cooper, Michael, MD  Ascorbic Acid (VITAMIN C) 1000 MG tablet Take 1,000 mg by mouth daily.    [provider]  aspirin -acetaminophen -caffeine  (EXCEDRIN MIGRAINE) 250-250-65 MG tablet Take by mouth as needed for headache. 09/04/18   [provider]  butalbital -aspirin -caffeine -codeine  (FIORINAL WITH CODEINE ) 50-325-40-30 MG capsule Take 1 capsule by mouth every 4 (four) hours as needed for pain. 07/05/24   Millikan, Megan, NP  diazepam   (VALIUM ) 5 MG tablet Take 2.5-5 mg by mouth 2 (two) times daily as needed for anxiety.  10/05/14   [provider]  furosemide  (LASIX ) 20 MG tablet TAKE 1 TABLET BY MOUTH DAILY 11/07/23   Shlomo Wilbert SAUNDERS, MD  ibuprofen (ADVIL,MOTRIN) 200 MG tablet Take 200-400 mg by mouth every 8 (eight) hours as needed (for arthritis in the feet).    [provider]  losartan  (COZAAR ) 25 MG tablet TAKE 1 TABLET BY MOUTH DAILY 07/29/24   Shlomo Wilbert SAUNDERS, MD  Magnesium  125 MG CAPS One  capsule at bedtime. po 12/22/23   Dohmeier, Dedra, MD  Nebivolol  HCl 20 MG TABS TAKE 1 TABLET BY MOUTH TWICE A DAY IN THE MORNING AND AT BEDTIME 07/26/24   Fenton, Clint R, PA  niacin  500 MG tablet Take 500 mg by mouth at bedtime.    [provider]  rosuvastatin  (CRESTOR ) 20 MG tablet TAKE 1 TABLET BY MOUTH DAILY 12/18/23   Shlomo Wilbert SAUNDERS, MD  Specialty Vitamins Products (CENTRUM SPECIALIST ENERGY) TABS Take 1 tablet by mouth daily with breakfast.    [provider]  tadalafil (CIALIS) 5 MG tablet Take 5 mg by mouth every evening.    [provider]  Testosterone  40.5 MG/2.5GM (1.62%) GEL Place 4 Pump onto the skin daily. Apply 4 pumps to each shoulder every morning    [provider]  traZODone  (DESYREL ) 50 MG tablet TAKE 1.5 TABLETS  BY MOUTH AT BEDTIME 08/06/24   Millikan, Megan, NP  Turmeric 500 MG CAPS Take 500 mg by mouth daily.    [provider]  vitamin E 400 UNIT capsule Take 400 Units by mouth at bedtime.     [provider]    Allergies: Dog epithelium (canis lupus familiaris), Ciprofloxacin, and Mushroom extract complex (obsolete)    Review of Systems  Cardiovascular:  Positive for palpitations.  All other systems reviewed and are negative.   Updated Vital Signs Ht 5' 7 (1.702 m)   Wt 67.1 kg   BMI 23.18 kg/m   Physical Exam Vitals and nursing note reviewed.  Constitutional:      Appearance: He is well-developed.  HENT:     Head:  Normocephalic and atraumatic.  Eyes:     Conjunctiva/sclera: Conjunctivae normal.     Pupils: Pupils are equal, round, and reactive to light.  Cardiovascular:     Rate and Rhythm: Regular rhythm. Tachycardia present.     Heart sounds: Normal heart sounds.     Comments: Tachy 120's during exam, seems sinus Pulmonary:     Effort: Pulmonary effort is normal.     Breath sounds: Normal breath sounds.  Abdominal:     General: Bowel sounds are normal.     Palpations: Abdomen is soft.  Musculoskeletal:        General: Normal range of motion.     Cervical back: Normal range of motion.  Skin:    General: Skin is warm and dry.  Neurological:     Mental Status: He is alert and oriented to person, place, and time.     (all labs ordered are listed, but only abnormal results are displayed) Labs Reviewed  CBC WITH DIFFERENTIAL/PLATELET - Abnormal; Notable for the following components:      Result Value   RBC 3.50 (*)    Hemoglobin 12.7 (*)    HCT 37.2 (*)    MCV 106.3 (*)    MCH 36.3 (*)    All other components within normal limits  BASIC METABOLIC PANEL WITH GFR - Abnormal; Notable for the following components:   Glucose, Bld 135 (*)    BUN 28 (*)    Creatinine, Ser 1.84 (*)    Calcium  8.1 (*)    GFR, Estimated 38 (*)    All other components within normal limits  TROPONIN T, HIGH SENSITIVITY - Abnormal; Notable for the following components:   Troponin T High Sensitivity 32 (*)    All other components within normal limits  MAGNESIUM   PRO BRAIN NATRIURETIC PEPTIDE  PROCALCITONIN  CBC WITH DIFFERENTIAL/PLATELET  COMPREHENSIVE METABOLIC PANEL WITH GFR  TROPONIN T, HIGH SENSITIVITY    EKG: None  Radiology: [facility] Chest Port 1 View Result Date: 08/13/2024 EXAM: 1 VIEW(S) XRAY OF THE CHEST 08/13/2024 01:51:11 AM COMPARISON: 04/06/2023 CLINICAL HISTORY: Chest pressure, palpitations. FINDINGS: LUNGS AND PLEURA: Mild patchy lingular and bilateral lower lobe opacities, suspicious for  pneumonia. No pleural effusion. No pneumothorax. HEART AND MEDIASTINUM: Cardiomegaly. Calcified tortuous aorta. BONES AND SOFT TISSUES: Sternal wires noted. No acute osseous abnormality. IMPRESSION: 1. Mild patchy lingular and bilateral lower lobe opacities, suspicious for pneumonia. 2. Aortic Atherosclerosis (ICD10-I70.0). Electronically signed by: Pinkie Pebbles MD 08/13/2024 01:54 AM EST RP Workstation: [id number]     Procedures   CRITICAL CARE Performed by: Olam CHRISTELLA Slocumb   Total critical care time: 35 minutes  Critical care time was exclusive of separately billable procedures and treating other patients.  Critical care was  necessary to treat or prevent imminent or life-threatening deterioration.  Critical care was time spent personally by me on the following activities: development of treatment plan with patient and/or surrogate as well as nursing, discussions with consultants, evaluation of patient's response to treatment, examination of patient, obtaining history from patient or surrogate, ordering and performing treatments and interventions, ordering and review of laboratory studies, ordering and review of radiographic studies, pulse oximetry and re-evaluation of patient's condition.   Medications Ordered in the ED  diltiazem (CARDIZEM) 1 mg/mL load via infusion 10 mg (0 mg Intravenous Hold 08/13/24 0145)    And  diltiazem (CARDIZEM) 125 mg in dextrose  5% 125 mL (1 mg/mL) infusion (0 mg/hr Intravenous Hold 08/13/24 0145)  apixaban  (ELIQUIS ) tablet 5 mg (has no administration in time range)  acetaminophen  (TYLENOL ) tablet 650 mg (has no administration in time range)    Or  acetaminophen  (TYLENOL ) suppository 650 mg (has no administration in time range)  melatonin tablet 3 mg (has no administration in time range)  ondansetron  (ZOFRAN ) injection 4 mg (has no administration in time range)  metoprolol  tartrate (LOPRESSOR ) injection 2.5 mg (has no administration in time range)                                     Medical Decision Making Amount and/or Complexity of Data Reviewed Labs: ordered. Radiology: ordered and independent interpretation performed. ECG/medicine tests: ordered and independent interpretation performed.  Risk Prescription drug management. Decision regarding hospitalization.   79 year old male presenting to the ED with palpitations.  Began yesterday evening around 830 to 9 PM.  Does have history of A-fib status post ablation with Dr. Inocencio x 2.  He has been compliant with all medications.  On arrival he is in a sinus tach in the 120s, seems quite regular.  He is not endorsing any ongoing chest pain at this time.  EKG showing same.  Plan for labs, chest x-ray.  1:29 AM Now back into AFIB, rate 110-120's.  Will given cardizem bolus and start drip.  2:19 AM Patient's BP has dropped a bit, currently 105/71.  HR is around 100 right now so relatively rate controlled and he actually reports he is feeling better.  Cardizem held for now.    Labs as above--creatinine is 1.8, similar to prior.  No leukocytosis.  Magnesium  is normal.  Troponin is 32.  CXR with stable pneumonia, however patient has not had any cough, chills, or other URI symptoms so suspicion for PNA less likely currently.  Will add on BNP to assess for fluid overload but clinically he does not appear hypervolemic on exam.  Will discuss with medical team for admission.  Spoke with Dr. Marcene-- will admit for ongoing care.    Cards aware of patient, he did call the after hours line prior to coming in.  They will consult when able.  Final diagnoses:  Paroxysmal atrial fibrillation [facility])    ED Discharge Orders     None          Jarold Olam HERO, PA-C 08/13/24 0527    Jarold Olam HERO, PA-C 08/13/24 [id number]    Lorette Mayo, MD 08/13/24 [phone]  "

## 2024-08-13 NOTE — H&P (Signed)
 " History and Physical   Patient: [name]                            PCP: Otho Darnelle BRAVO, MD                    DOB: November 05, 1945            DOA: 08/13/2024 FMW:[id number]             DOS: 08/13/2024, 10:02 AM  Otho Darnelle BRAVO, MD  Patient coming from:   HOME  I have personally reviewed patient's medical records, in electronic medical records, including:  [place] [facility], and care everywhere.    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  Bicuspid aortic valve, AAA (4.7 cm)...   Presenting with palpitation.  Some chest pain.  Reports symptoms started yesterday.  Similar symptoms as previous episodes.  Did not have any shortness of breath.  Denies any change in medications, or recent illnesses.  Denies of having any fever, chills, nausea or vomiting. Patient reports has history of paroxysmal A-fib with 2 prior ablation with Dr. Inocencio (March 2021 and January 2023)  ED Evaluation: Blood pressure 121/70, pulse 99, temperature 98.3 F (36.8 C), temperature source Oral, resp. rate 14, height 5' 7 (1.702 m), weight 67.1 kg, SpO2 97%.  LABs: Hemoglobin 0.9, BUN 29, creatinine 1.84, 1.77, calcium  8.0, potassium 4.4 magnesium  2.4, troponin 33, Pro-Cal < 0.10, BNP 2,143.0   CXR : Mild patchy airspace opacity, bilateral lower lobe -possible pneumonia, per report from EMS patient was found in a heart rate of 140s, systolic BP in 100s, IV fluid was given,  Patient has been started on Cardizem  drip, continue Eliquis .     Patient Denies having: Fever, Chills, Cough, SOB, Chest Pain, Abd pain, N/V/D, headache, dizziness, lightheadedness,  Dysuria, Joint pain, rash, open wounds    Review of Systems: As per HPI, otherwise 10 point review of systems were negative.    ----------------------------------------------------------------------------------------------------------------------  Allergies[1]  Home MEDs:  Prior to Admission medications  Medication Sig Start Date End Date Taking? Authorizing Provider  ALPRAZolam  (XANAX  XR) 1 MG 24 hr tablet TAKE 1 TABLET BY MOUTH DAILY 07/07/24  Yes Dohmeier, Dedra, MD  amLODipine  (NORVASC ) 5 MG tablet TAKE 1 TABLET BY MOUTH DAILY 11/07/23  Yes Turner, Wilbert SAUNDERS, MD  apixaban  (ELIQUIS ) 5 MG TABS tablet Take 1 tablet (5 mg total) by mouth 2 (two) times daily. 04/27/21  Yes Cooper, Michael, MD  Ascorbic Acid (VITAMIN C) 1000 MG tablet Take 1,000 mg by mouth daily.   Yes [provider]  aspirin -acetaminophen -caffeine  (EXCEDRIN MIGRAINE) 250-250-65 MG tablet Take by mouth as needed for headache. 09/04/18  Yes [provider]  butalbital -aspirin -caffeine -codeine  (FIORINAL WITH CODEINE ) 50-325-40-30 MG capsule Take 1 capsule by mouth every 4 (four) hours as needed for pain. 07/05/24  Yes Millikan, Megan, NP  diazepam  (VALIUM ) 5 MG tablet Take 2.5-5 mg by mouth 2 (two) times daily as needed for anxiety.  10/05/14  Yes [provider]  furosemide  (LASIX ) 20 MG tablet TAKE 1 TABLET BY MOUTH DAILY 11/07/23  Yes Turner, Wilbert SAUNDERS, MD  ibuprofen (ADVIL,MOTRIN) 200 MG tablet Take 200-400 mg by mouth every 8 (eight) hours as needed (for arthritis  in the feet).   Yes [provider]  losartan  (COZAAR ) 25 MG tablet TAKE 1 TABLET BY MOUTH DAILY 07/29/24  Yes Turner, Wilbert SAUNDERS, MD  Magnesium  125 MG CAPS One  capsule at bedtime. po Patient taking differently: Take 125 mg by mouth daily. One  capsule at bedtime. po 12/22/23  Yes Dohmeier, Dedra, MD  Nebivolol  HCl 20 MG TABS TAKE 1 TABLET BY MOUTH TWICE A DAY IN THE MORNING AND AT BEDTIME Patient taking differently: Take 20 mg by mouth in the morning and at bedtime. 07/26/24  Yes Fenton, Clint R, PA  niacin  500 MG tablet Take 500 mg by mouth at bedtime.   Yes  [provider]  rosuvastatin  (CRESTOR ) 20 MG tablet TAKE 1 TABLET BY MOUTH DAILY 12/18/23  Yes Turner, Wilbert SAUNDERS, MD  Specialty Vitamins Products (CENTRUM SPECIALIST ENERGY) TABS Take 1 tablet by mouth daily with breakfast.   Yes [provider]  tadalafil (CIALIS) 5 MG tablet Take 5 mg by mouth every evening.   Yes [provider]  Testosterone  40.5 MG/2.5GM (1.62%) GEL Place 4 Pump onto the skin daily. Apply 4 pumps to each shoulder every morning   Yes [provider]  Turmeric 500 MG CAPS Take 1,000 mg by mouth daily.   Yes [provider]  vitamin E  400 UNIT capsule Take 400 Units by mouth at bedtime.    Yes [provider]  ALPRAZolam  (XANAX  XR) 1 MG 24 hr tablet Take 1 tablet (1 mg total) by mouth daily. 07/05/24   Millikan, Megan, NP  traZODone  (DESYREL ) 50 MG tablet TAKE 1.5 TABLETS BY MOUTH AT BEDTIME Patient taking differently: Take 75 mg by mouth at bedtime. 08/06/24   Millikan, Megan, NP    PRN MEDs: acetaminophen , diazepam , melatonin, metoprolol  tartrate, ondansetron  (ZOFRAN ) IV  Past Medical History:  Diagnosis Date   Anal fissure    Aortic insufficiency    Arthritis    Atrial flutter (HCC)    Bicuspid aortic valve    with mild to moderate AR by echo 08/2019   BPH (benign prostatic hyperplasia)    CAD (coronary artery disease)    s/p CABG [place] 1998   Cataract    bilateral-removed   CKD (chronic kidney disease), stage III (HCC)    Depression    Dilated aortic root    History of blood transfusion 1998   due to cardiac bypass surgery   Hyperlipidemia    Hypertension    Hypogonadism in male    Kidney stones    Migraine    Mild carotid artery disease 2009   Myocardial infarction (HCC)    PVC's (premature ventricular contractions)    Retinal hemorrhage 11/04/2014   Sleep apnea with use of continuous positive airway pressure (CPAP)    Thoracic aortic aneurysm (TAA)    51mm aoartic root and 47mm ascending  aorta by echo 08/2022    Past Surgical History:  Procedure Laterality Date   A-FLUTTER ABLATION N/A 09/29/2019   Procedure: A-FLUTTER ABLATION;  Surgeon: Inocencio Soyla Lunger, MD;  Location: [facility] INVASIVE CV [facility];  Service: Cardiovascular;  Laterality: N/A;   ATRIAL FIBRILLATION ABLATION N/A 08/02/2021   Procedure: ATRIAL FIBRILLATION ABLATION;  Surgeon: Inocencio Soyla Lunger, MD;  Location: [facility] IN[facility]AS[facility] CV [facility];  Service: Cardiovascular;  Laterality: N/A;   CARDIOVERSION N/A 08/10/2019   Procedure: CARDIOVERSION;  Surgeon: Loni Soyla LABOR, MD;  Location: [facility] ENDOSCOPY;  Service: Cardiovascular;  Laterality: N/A;   CARDIOVERSION N/A 05/21/2021   Procedure: CARDIOVERSION;  Surgeon: Delford Maude BROCKS, MD;  Location: [facility] ENDOSCOPY;  Service: Cardiovascular;  Laterality: N/A;   CARDIOVERSION N/A 07/12/2021   Procedure: CARDIOVERSION;  Surgeon: Jeffrie Oneil BROCKS, MD;  Location: [facility] ENDOSCOPY;  Service: Cardiovascular;  Laterality: N/A;   cataracts     COLONOSCOPY     CORONARY ARTERY BYPASS GRAFT  1998   LEFT HEART CATH AND CORONARY ANGIOGRAPHY N/A 08/24/2018   Procedure: LEFT HEART CATH AND CORONARY ANGIOGRAPHY;  Surgeon: Court Dorn PARAS, MD;  Location: [facility] INVAS[facility] CV LAB;  Service: Cardiovascular;  Laterality: N/A;   TEE WITHOUT CARDIOVERSION N/A 01/19/2015   Procedure: TRANSESOPHAGEAL ECHOCARDIOGRAM (TEE);  Surgeon: Wilbert JONELLE Bihari, MD;  Location: [facility] ENDOSCOPY;  Service: Cardiovascular;  Laterality: N/A;   TOOTH EXTRACTION     VARICOSE VEIN SURGERY  1984   pt denies     reports that he quit smoking about 38 years ago. His smoking use included cigarettes. He started smoking about 58 years ago. He has never used smokeless tobacco. He reports current alcohol use of about 1.0 standard drink of alcohol per week. He reports that he does not use drugs.   Family History  Problem Relation Age of Onset   Arthritis Mother        deceased   Hyperlipidemia Mother    Diabetes Mother    Arthritis Father     Hyperlipidemia Father    Heart disease Father 52   Stroke Father 63       deceased   Hypertension Father    Diabetes Father    Kidney disease Paternal Grandfather    Colon cancer Neg Hx    Colon polyps Neg Hx    Esophageal cancer Neg Hx    Stomach cancer Neg Hx    Rectal cancer Neg Hx    Sleep apnea Neg Hx     Physical Exam:   Vitals:   08/13/24 0700 08/13/24 0730 08/13/24 0745 08/13/24 0937  BP: 106/72 108/74 121/70   Pulse: 95 96 99   Resp: 15 16 14    Temp:    97.7 F (36.5 C)  TempSrc:    Oral  SpO2: 97% 97% 97%   Weight:      Height:       Constitutional: NAD, calm, comfortable Eyes: PERRL, lids and conjunctivae normal ENMT: Mucous membranes are moist. Posterior pharynx clear of any exudate or lesions.Normal dentition.  Neck: normal, supple, no masses, no thyromegaly Respiratory: clear to auscultation bilaterally, no wheezing, no crackles. Normal respiratory effort. No accessory muscle use.  Cardiovascular: Regular rate and rhythm, no murmurs / rubs / gallops. No extremity edema. 2+ pedal pulses. No carotid bruits.  Abdomen: no tenderness, no masses palpated. No hepatosplenomegaly. Bowel sounds positive.  Musculoskeletal: no clubbing / cyanosis. No joint deformity upper and lower extremities. Good ROM, no contractures. Normal muscle tone.  Neurologic: CN II-XII grossly intact. Sensation intact, DTR normal. Strength 5/5 in all 4.  Psychiatric: Normal judgment and insight. Alert and oriented x 3. Normal mood.  Skin: no rashes, lesions, ulcers. No induration Decubitus/ulcers:  Wounds: per nursing documentation         Labs on admission:    I have personally reviewed following labs and imaging studies  CBC: Recent Labs  Lab 08/13/24 0116 08/13/24 0357  WBC 7.3 7.4  NEUTROABS 5.4 5.1  HGB 12.7* 11.9*  HCT 37.2* 34.7*  MCV 106.3* 106.1*  PLT 190 186   Basic Metabolic Panel: Recent Labs  Lab 08/13/24 0116 08/13/24 0357  NA  141 139  K 4.1 4.4  CL  103 106  CO2 26 25  GLUCOSE 135* 123*  BUN 28* 29*  CREATININE 1.84* 1.77*  CALCIUM  8.1* 8.0*  MG 2.4  --    GFR: Estimated Creatinine Clearance: 33.2 mL/min (A) (by C-G formula based on SCr of 1.77 mg/dL (H)). Liver Function Tests: Recent Labs  Lab 08/13/24 0357  AST 34  ALT 24  ALKPHOS 109  BILITOT 0.2  PROT 5.4*  ALBUMIN 3.5   No results for input(s): LIPASE, AMYLASE in the last 168 hours. No results for input(s): AMMONIA in the last 168 hours. Coagulation Profile: No results for input(s): INR, PROTIME in the last 168 hours. Cardiac Enzymes: No results for input(s): CKTOTAL, CKMB, CKMBINDEX, TROPONINI in the last 168 hours. BNP (last 3 results) Recent Labs    08/13/24 0357  PROBNP 2,143.0*     Last A1C:  Lab Results  Component Value Date   HGBA1C 5.9 (H) 08/22/2018     Radiologic Exams on Admission:   DG Chest Port 1 View Result Date: 08/13/2024 EXAM: 1 VIEW(S) XRAY OF THE CHEST 08/13/2024 01:51:11 AM COMPARISON: 04/06/2023 CLINICAL HISTORY: Chest pressure, palpitations. FINDINGS: LUNGS AND PLEURA: Mild patchy lingular and bilateral lower lobe opacities, suspicious for pneumonia. No pleural effusion. No pneumothorax. HEART AND MEDIASTINUM: Cardiomegaly. Calcified tortuous aorta. BONES AND SOFT TISSUES: Sternal wires noted. No acute osseous abnormality. IMPRESSION: 1. Mild patchy lingular and bilateral lower lobe opacities, suspicious for pneumonia. 2. Aortic Atherosclerosis (ICD10-I70.0). Electronically signed by: Pinkie Pebbles MD 08/13/2024 01:54 AM EST RP Workstation: [id number]    EKG:   Independently reviewed.  Orders placed or performed during the hospital encounter of 08/13/24   EKG 12-Lead   EKG 12-Lead   EKG 12-Lead   EKG 12-Lead   EKG   EKG   EKG 12-Lead (at 6am)   ---------------------------------------------------------------------------------------------------------------------------------------    Assessment / Plan:    Principal Problem:   Atrial fibrillation with RVR (HCC) Active Problems:   HTN (hypertension)   Hyperlipidemia   History of migraine   Hypogonadism in male   Anxiety and depression   Insomnia   OSA on CPAP   Acute diastolic congestive heart failure (HCC)   CKD (chronic kidney disease) stage 2, GFR 60-89 ml/min   Assessment and Plan: * Atrial fibrillation with RVR (HCC) With history of paroxysmal atrial fibrillation, and a prior history of 2 ablations -Started on Cardizem  drip -titrating for heart rate less than 100 -Continue Eliquis  -Cardiology consulted, appreciate further evaluation and recommendations -Obtain 2D echocardiogram, recycle troponin Chest pain has resolved  HTN (hypertension) Monitoring blood pressure, stable -Continue Lopressor , currently on Cardizem  drip, monitoring BP closely -Restarting losartan  and amlodipine  cautiously  Hyperlipidemia Continue rosuvastatin   CKD (chronic kidney disease) stage 2, GFR 60-89 ml/min Acute on CKD Likely CKD stage IIb Lab Results  Component Value Date   CREATININE 1.77 (H) 08/13/2024   CREATININE 1.84 (H) 08/13/2024   CREATININE 1.46 (H) 08/07/2023  BUN 28, 29, GFR 38, 39 Gentle IV fluid hydration, avoiding nephrotoxins Adjusting diuretics  Acute diastolic congestive heart failure (HCC) Last echo 09/10/2022, EF 62%, normal LV and RV function -Elevated BNP, monitoring I's and O's, -No signs of respiratory stress, or volume overload -Gentle IV fluid hydration due to elevated BUN/creatinine from baseline  OSA on CPAP Stable on room air, continue CPAP nightly  Insomnia - Continue trazodone , as needed Xanax   Anxiety and depression Mood stable, continue home medication as needed Xanax , trazodone , Valium ,  Hypogonadism in  male Continue testosterone  infusion as an outpatient  History of migraine History of migraine headaches -Monitoring closely -Continue as needed analgesics   History of coronary artery  disease, with CABG 1998, MI, and AAA Monitoring closely -Continue home medication as above - Patient to continue follow-up with primary cardiology and  cardiothoracic Dr. Redell Fellers  As outpatient   Consults called: Cardiology -------------------------------------------------------------------------------------------------------------------------------------------- DVT prophylaxis:  SCDs Start: 08/13/24 0911 Place TED hose Start: 08/13/24 0911 apixaban  (ELIQUIS ) tablet 5 mg   Code Status:   Code Status: Full Code   Admission status: Patient will be admitted as Observation, with a greater than 2 midnight length of stay. Level of care: Progressive   Family Communication:  none at bedside  (The above findings and plan of care has been discussed with patient in detail, the patient expressed understanding and agreement of above plan)  --------------------------------------------------------------------------------------------------------------------------------------------------  Disposition Plan:  Anticipated 1-2 days Status is: Observation The patient remains OBS appropriate and will d/c before 2 midnights.     ----------------------------------------------------------------------------------------------------------------------------------------------------  Time spent:  62  Min.  Was spent seeing and evaluating the patient, reviewing all medical records, drawn plan of care.  SIGNED: Adriana DELENA Grams, MD, FHM. FAAFP. [place] [facility], Pager  (Please use [email or web address] to page/ or secure chat through epic) If 7PM-7AM, please contact night-coverage [email or web address],  08/13/2024, 10:02 AM     [1]  Allergies Allergen Reactions   Dog Epithelium (Canis Lupus Familiaris) Itching, Anxiety, Palpitations, Other (See Comments), Cough and Shortness Of Breath    Other reaction(s): Respiratory Distress (ALLERGY/intolerance), Wheezing (ALLERGY/intolerance)   Ciprofloxacin  Other (See Comments)    Avoid as patient has an aortic aneurysm and this drug can increase risk of Aortic Dissection   Mushroom Extract Complex (Obsolete) Nausea Only and Other (See Comments)    Severe vertigo and headaches   "

## 2024-08-13 NOTE — Assessment & Plan Note (Signed)
 Last echo 09/10/2022, EF 62%, normal LV and RV function -Elevated BNP, monitoring I's and O's, -No signs of respiratory stress, or volume overload -Gentle IV fluid hydration due to elevated BUN/creatinine from baseline

## 2024-08-13 NOTE — ED Triage Notes (Signed)
 Pt BIB [facility] from home c/o chest discomfort. Pt states he is having palpitation feelings. Pt states he is a patient at the heart center at [facility]. Pt is on Eliquis .   70-140s AFIB  500 fluids given en route

## 2024-08-13 NOTE — Assessment & Plan Note (Signed)
 Continue rosuvastatin 

## 2024-08-13 NOTE — Consult Note (Signed)
 "  Cardiology Consultation   Patient ID: [name] MRN: [id number]; DOB: 1945/08/14  Admit date: 08/13/2024 Date of Consult: 08/13/2024  PCP:  Otho Darnelle BRAVO, MD   [facility] Providers Cardiologist:  Wilbert Bihari, MD   Patient Profile: [name] is a 79 y.o. male with a hx of CAD status post CABG x2 in 1998, persistent atrial fibrillation/flutter with repeat DCCV's x 3/ablation x 2, SVT, aortic aneurysm followed by CT surgery, bicuspid aortic valve?  Hypertension, hyperlipidemia, OSA on CPAP, CKD, who is being seen 08/13/2024 for the evaluation of recurrent atrial fibrillation at the request of Dr. Dianna.  History of Present Illness: [name] has history of CAD status post CABG.  2022 had NSTEMI occluded left radial graft to the RCA with patent LIMA-LAD with L-R collaterals. Medical therapy was recommended.  Also has a aortic aneurysm that has been stable on CT measurements.  02/2024 4.7 ascending aortic aneurysm.  Gets repeat imaging every 6 months.  There is prior documented history of bicuspid aortic valve however, most recent echocardiogram 08/2022 reports tricuspid.  More significantly he has had a long history of persistent atrial fibrillation/flutter dating back to at least 2020.  A-fib history: Successful DCCV performed at [facility] ED 04/2019 DCCV 08/10/2019: successful DCCV x 2 05/21/2021: successful A-flutter ablation 09/29/2019 DCCV 07/12/2021: successful A-fib ablation 08/02/2021 09/2022-recurrence of atrial fibrillation, spontaneously converted to sinus rhythm with IV fluids.   He was last seen 01/2024 by A-fib clinic.  In sinus rhythm at that time.  Reportedly if he had recurrence of atrial fibrillation may be candidate for PFA.  Patient currently being evaluated for recurrence of atrial fibrillation with complaints of palpitations and chest tightness.  Reportedly tachycardic with a heart rate of 140 per EMS.  Cardizem drip was ordered but not given  due to soft blood pressure and controlled rates currently.  First troponin 32-33.  Chest x-ray had read as possible pneumonia although there is no leukocytosis and with negative procalcitonin.  proBNP 2100+.  Patient reports that yesterday he had new onset of symptoms of the above symptoms that started yesterday night.  He reports he is not always 100% aware when he is in atrial fibrillation and sometimes thinks he is in it when he is not.  Longer having any complaints of chest tightness or shortness of breath.  Still very active and going to the gym.  Reports that yesterday he went to the gym for 55 minutes on the treadmill.  Says he lives alone with no significant other or children.  Reports remote history of tobacco use but quit in the 80s and 90s.  He does drink almost daily about a glass of wine.  Very rarely misses any doses of his Eliquis  and confirms no missed doses in the past 3+ weeks.     Past Medical History:  Diagnosis Date   Anal fissure    Aortic insufficiency    Arthritis    Atrial flutter (HCC)    Bicuspid aortic valve    with mild to moderate AR by echo 08/2019   BPH (benign prostatic hyperplasia)    CAD (coronary artery disease)    s/p CABG [place] 1998   Cataract    bilateral-removed   CKD (chronic kidney disease), stage III (HCC)    Depression    Dilated aortic root    History of blood transfusion 1998   due to cardiac bypass surgery   Hyperlipidemia    Hypertension  Hypogonadism in male    Kidney stones    Migraine    Mild carotid artery disease 2009   Myocardial infarction [facility])    PVC's (premature ventricular contractions)    Retinal hemorrhage 11/04/2014   Sleep apnea with use of continuous positive airway pressure (CPAP)    Thoracic aortic aneurysm (TAA)    51mm aoartic root and 47mm ascending aorta by echo 08/2022    Past Surgical History:  Procedure Laterality Date   A-FLUTTER ABLATION N/A 09/29/2019   Procedure: A-FLUTTER ABLATION;   Surgeon: Inocencio Soyla Lunger, MD;  Location: [facility] INVASIVE CV LAB;  Service: Cardiovascular;  Laterality: N/A;   ATRIAL FIBRILLATION ABLATION N/A 08/02/2021   Procedure: ATRIAL FIBRILLATION ABLATION;  Surgeon: Inocencio Soyla Lunger, MD;  Location: [facility] INVASIVE CV LAB;  Service: Cardiovascular;  Laterality: N/A;   CARDIOVERSION N/A 08/10/2019   Procedure: CARDIOVERSION;  Surgeon: Loni Soyla LABOR, MD;  Location: [facility] ENDOSCOPY;  Service: Cardiovascular;  Laterality: N/A;   CARDIOVERSION N/A 05/21/2021   Procedure: CARDIOVERSION;  Surgeon: Delford Maude BROCKS, MD;  Location: [facility] ENDOSCOPY;  Service: Cardiovascular;  Laterality: N/A;   CARDIOVERSION N/A 07/12/2021   Procedure: CARDIOVERSION;  Surgeon: Jeffrie Oneil BROCKS, MD;  Location: [facility] ENDOSCOPY;  Service: Cardiovascular;  Laterality: N/A;   cataracts     COLONOSCOPY     CORONARY ARTERY BYPASS GRAFT  1998   LEFT HEART CATH AND CORONARY ANGIOGRAPHY N/A 08/24/2018   Procedure: LEFT HEART CATH AND CORONARY ANGIOGRAPHY;  Surgeon: Court Dorn PARAS, MD;  Location: [facility] INVASIVE CV LAB;  Service: Cardiovascular;  Laterality: N/A;   TEE WITHOUT CARDIOVERSION N/A 01/19/2015   Procedure: TRANSESOPHAGEAL ECHOCARDIOGRAM (TEE);  Surgeon: Wilbert JONELLE Bihari, MD;  Location: [facility] ENDOSCOPY;  Service: Cardiovascular;  Laterality: N/A;   TOOTH EXTRACTION     VARICOSE VEIN SURGERY  1984   pt denies     Scheduled Meds:  apixaban   5 mg Oral BID   diltiazem  10 mg Intravenous Once   [START ON 08/14/2024] furosemide   20 mg Oral Daily   [START ON 08/14/2024] losartan   25 mg Oral Daily   rosuvastatin   20 mg Oral Daily   vitamin E  400 Units Oral QHS   Continuous Infusions:  sodium chloride      diltiazem (CARDIZEM) infusion Stopped (08/13/24 0145)   PRN Meds: acetaminophen , diazepam , melatonin, metoprolol  tartrate, ondansetron  (ZOFRAN ) IV  Allergies:   Allergies[1]  Social History:   Social History   Socioeconomic History   Marital status: Divorced    Spouse name: Not  on file   Number of children: 0   Years of education: Not on file   Highest education level: Not on file  Occupational History   Occupation: retired  Tobacco Use   Smoking status: Former    Current packs/day: 0.00    Types: Cigarettes    Start date: 07/22/1966    Quit date: 07/22/1986    Years since quitting: 38.0   Smokeless tobacco: Never   Tobacco comments:    Former smoker 05/09/2021  Vaping Use   Vaping status: Never Used  Substance and Sexual Activity   Alcohol use: Yes    Alcohol/week: 1.0 standard drink of alcohol    Types: 1 Glasses of wine per week    Comment: 0-1 daily   Drug use: No   Sexual activity: Yes  Other Topics Concern   Not on file  Social History Narrative   Divorced   Publishing rights manager- retired   No children   Has  a cat named Geofm   Enjoys outdoor activities- hiking, swimming, archeology, baseball, writing, reading, cooking   Social Drivers of Health   Tobacco Use: Medium Risk (08/13/2024)   Patient History    Smoking Tobacco Use: Former    Smokeless Tobacco Use: Never    Passive Exposure: Not on Actuary Strain: Not on file  Food Insecurity: Low Risk (05/28/2024)   Received from [facility]   Epic    Within the past 12 months, you worried that your food would run out before you got money to buy more: Never true    Within the past 12 months, the food you bought just didn't last and you didn't have money to get more. : Never true  Transportation Needs: No Transportation Needs (05/28/2024)   Received from [facility]    In the past 12 months, has lack of reliable transportation kept you from medical appointments, meetings, work or from getting things needed for daily living? : No  Physical Activity: Not on file  Stress: Not on file  Social Connections: Not on file  Intimate Partner Violence: Not on file  Depression (EYV7-0): Not on file  Alcohol Screen: Not on file  Housing: Low Risk (05/28/2024)   Received from  [facility]   Epic    What is your living situation today?: I have a steady place to live    Think about the place you live. Do you have problems with any of the following? Choose all that apply:: None/None on this list  Utilities: Low Risk (05/28/2024)   Received from [facility]   Utilities    In the past 12 months has the electric, gas, oil, or water company threatened to shut off services in your home? : No  Health Literacy: Not on file    Family History:   Family History  Problem Relation Age of Onset   Arthritis Mother        deceased   Hyperlipidemia Mother    Diabetes Mother    Arthritis Father    Hyperlipidemia Father    Heart disease Father 53   Stroke Father 107       deceased   Hypertension Father    Diabetes Father    Kidney disease Paternal Grandfather    Colon cancer Neg Hx    Colon polyps Neg Hx    Esophageal cancer Neg Hx    Stomach cancer Neg Hx    Rectal cancer Neg Hx    Sleep apnea Neg Hx      ROS:  Please see the history of present illness.  All other ROS reviewed and negative.     Physical Exam/Data: Vitals:   08/13/24 0730 08/13/24 0745 08/13/24 0937 08/13/24 1047  BP: 108/74 121/70  110/62  Pulse: 96 99  73  Resp: 16 14  13   Temp:   97.7 F (36.5 C)   TempSrc:   Oral   SpO2: 97% 97%  98%  Weight:      Height:       No intake or output data in the 24 hours ending 08/13/24 1051    08/13/2024    1:14 AM 06/24/2024    9:24 AM 04/14/2024    1:44 PM  Last 3 Weights  Weight (lbs) 148 lb 152 lb 6.4 oz 142 lb  Weight (kg) 67.132 kg 69.128 kg 64.411 kg     Body mass index is 23.18 kg/m.  General:  Well nourished, well  developed, in no acute distress HEENT: normal Neck: no JVD Vascular: No carotid bruits; Distal pulses 2+ bilaterally Cardiac:  IRRR; no murmur  Lungs:  clear to auscultation bilaterally, no wheezing, rhonchi or rales  Abd: soft, nontender, no hepatomegaly  Ext: Mild edema in the ankles Musculoskeletal:  No deformities,  BUE and BLE strength normal and equal Skin: warm and dry  Neuro:  CNs 2-12 intact, no focal abnormalities noted Psych:  Normal affect   EKG:  The EKG was personally reviewed and demonstrates: Suspect atrial flutter.  Heart rate 130 Telemetry:  Telemetry was personally reviewed and demonstrates: Atrial fibrillation heart rate 80-100  Relevant CV Studies: Echocardiogram 08/26/2022  1. Left ventricular ejection fraction, by estimation, is 60 to 65%. Left  ventricular ejection fraction by 3D volume is 62 %. The left ventricle has  normal function. The left ventricle has no regional wall motion  abnormalities. Left ventricular diastolic   parameters were normal.   2. Right ventricular systolic function is normal. The right ventricular  size is normal. There is normal pulmonary artery systolic pressure. The  estimated right ventricular systolic pressure is 33.9 mmHg.   3. Left atrial size was mildly dilated.   4. The mitral valve is normal in structure. No evidence of mitral valve  regurgitation. No evidence of mitral stenosis.   5. The aortic valve is tricuspid. There is mild calcification of the  aortic valve. There is mild thickening of the aortic valve. Aortic valve  regurgitation is mild. No aortic stenosis is present. Aortic regurgitation  PHT measures 554 msec.   6. Aortic dilatation noted. Aneurysm of the aortic root, measuring 51 mm.  Aneurysm of the ascending aorta, measuring 47 mm.   7. The inferior vena cava is normal in size with greater than 50%  respiratory variability, suggesting right atrial pressure of 3 mmHg.   Comparison(s): No significant change from prior study. Prior images  reviewed side by side.   Laboratory Data: High Sensitivity Troponin:  No results for input(s): TROPONINIHS in the last 720 hours.  Recent Labs  Lab 08/13/24 0116 08/13/24 0357  TRNPT 32* 33*      Chemistry Recent Labs  Lab 08/13/24 0116 08/13/24 0357  NA 141 139  K 4.1 4.4  CL 103  106  CO2 26 25  GLUCOSE 135* 123*  BUN 28* 29*  CREATININE 1.84* 1.77*  CALCIUM  8.1* 8.0*  MG 2.4  --   GFRNONAA 38* 39*  ANIONGAP 11 8    Recent Labs  Lab 08/13/24 0357  PROT 5.4*  ALBUMIN 3.5  AST 34  ALT 24  ALKPHOS 109  BILITOT 0.2   Lipids  Recent Labs  Lab 08/13/24 0357  CHOL 73  TRIG 122  HDL 32*  LDLCALC 16  CHOLHDL 2.3    Hematology Recent Labs  Lab 08/13/24 0116 08/13/24 0357  WBC 7.3 7.4  RBC 3.50* 3.27*  HGB 12.7* 11.9*  HCT 37.2* 34.7*  MCV 106.3* 106.1*  MCH 36.3* 36.4*  MCHC 34.1 34.3  RDW 12.0 11.9  PLT 190 186   Thyroid   Recent Labs  Lab 08/13/24 0357  TSH 2.400    BNP Recent Labs  Lab 08/13/24 0357  PROBNP 2,143.0*    DDimer No results for input(s): DDIMER in the last 168 hours.  Radiology/Studies:  DG Chest Port 1 View Result Date: 08/13/2024 EXAM: 1 VIEW(S) XRAY OF THE CHEST 08/13/2024 01:51:11 AM COMPARISON: 04/06/2023 CLINICAL HISTORY: Chest pressure, palpitations. FINDINGS: LUNGS AND PLEURA: Mild patchy  lingular and bilateral lower lobe opacities, suspicious for pneumonia. No pleural effusion. No pneumothorax. HEART AND MEDIASTINUM: Cardiomegaly. Calcified tortuous aorta. BONES AND SOFT TISSUES: Sternal wires noted. No acute osseous abnormality. IMPRESSION: 1. Mild patchy lingular and bilateral lower lobe opacities, suspicious for pneumonia. 2. Aortic Atherosclerosis (ICD10-I70.0). Electronically signed by: Pinkie Pebbles MD 08/13/2024 01:54 AM EST RP Workstation: [id number]     Assessment and Plan:  Persistent atrial fibrillation -Previous cardioversions: 04/2019 [facility]), 05/21/21, 07/12/21, 09/2022 (spontaneous conversion) -Previous ablations: 09/29/19, 08/02/21 -01/2024 sinus rhythm  Long history of persistent atrial fibrillation requiring multiple cardioversions and ablations.  Presenting with recurrence of atrial fibrillation again with RVR but now with controlled heart rates at the time being around 80-100.   Goal less than 110.  Reported symptoms of SOB/palpitations since last night which have now since resolved.  Euvolemic on exam. Continue with Eliquis  5 mg twice daily.  Got dose this morning.  Confirmed no missed doses in 3+ weeks.  Discussed with MD we will plan for DCCV only today.  Patient has been NPO since atleast midnight. Prior A-fib notes reported consideration of PFA ablation.  He will need to follow-up with A-fib clinic or EP outpatient to consider repeat ablation/antiarrhythmic therapy PTA on Nebivolol  20 mg twice daily.  Will hold for now until we know what his heart rates after cardioversion are. Reports daily drinking 1 glass of wine.  May be contributing, to what extent data is mixed.  Would recommend cutting back to rule out contributing factors. TSH normal this admission. In the future would recommend heart monitor or Kardia mobile device  Elevated troponins CAD Hyperlipidemia -1998 CABG x 2 LIMA-LAD, vein graft RCA -08/2018 LHC patent LIMA to the LAD with left to right collaterals and moderate nondominant circumflex disease .  Medical management. Stable CAD without any resolved chest tightness.  Suspect symptoms likely related to rapid rates.  Troponin 32-33-38 argues against ACS.  Has excellent functional capacity with no exertional symptoms. No aspirin  while on DAPT therapy. Continue rosuvastatin  20 mg. Can reevaluate outpatient if ischemic evaluation warranted.  Aortic aneurysm Seems fairly stable.  02/2024 CTA 4.7 cm ascending aortic aneurysm.  CT surgery following this.  There has been some mention of bicuspid aortic valve in the past.  Tricuspid aortic valve (clearly tricuspid on TEE 2016).  Most recent echo 08/2022 reports tricuspid.  No history of familial dissection. Continue routine screening semiannually.  OSA compliant with CPAP  CKD Followed by nephrology outpatient.  Informed Consent   Shared Decision Making/Informed Consent{  The risks (stroke, cardiac  arrhythmias rarely resulting in the need for a temporary or permanent pacemaker, skin irritation or burns and complications associated with conscious sedation including aspiration, arrhythmia, respiratory failure and death), benefits (restoration of normal sinus rhythm) and alternatives of a direct current cardioversion were explained in detail to [name] and he agrees to proceed.        He would like to discharge as soon as possible.  Assuming no complications after this procedure not unreasonable to consider.  Defer to MDs Risk Assessment/Risk Scores:  CHA2DS2-VASc Score = 5   This indicates a 7.2% annual risk of stroke. The patient's score is based upon: CHF History: 1 HTN History: 1 Diabetes History: 0 Stroke History: 0 Vascular Disease History: 1 Age Score: 2 Gender Score: 0   For questions or updates, please contact  [facility] Please consult [email or web address] for contact info under     Signed, Thom LITTIE Sluder, PA-C  08/13/2024 10:51  AM       [1]  Allergies Allergen Reactions   Dog Epithelium (Canis Lupus Familiaris) Itching, Anxiety, Palpitations, Other (See Comments), Cough and Shortness Of Breath    Other reaction(s): Respiratory Distress (ALLERGY/intolerance), Wheezing (ALLERGY/intolerance)   Ciprofloxacin Other (See Comments)    Avoid as patient has an aortic aneurysm and this drug can increase risk of Aortic Dissection   Mushroom Extract Complex (Obsolete) Nausea Only and Other (See Comments)    Severe vertigo and headaches   "

## 2024-08-13 NOTE — Anesthesia Preprocedure Evaluation (Signed)
"                                    Anesthesia Evaluation  Patient identified by MRN, date of birth, ID band Patient awake    Reviewed: Allergy & Precautions, H&P , NPO status , Patient's Chart, lab work & pertinent test results  Airway Mallampati: II   Neck ROM: full    Dental   Pulmonary sleep apnea , former smoker   breath sounds clear to auscultation       Cardiovascular hypertension, + CAD, + CABG and +CHF  + dysrhythmias Atrial Fibrillation + Valvular Problems/Murmurs AI  Rhythm:irregular Rate:Normal  Mild AI   Neuro/Psych  Headaches PSYCHIATRIC DISORDERS Anxiety Depression       GI/Hepatic   Endo/Other    Renal/GU Renal InsufficiencyRenal disease     Musculoskeletal  (+) Arthritis ,    Abdominal   Peds  Hematology   Anesthesia Other Findings   Reproductive/Obstetrics                              Anesthesia Physical Anesthesia Plan  ASA: 3  Anesthesia Plan: General   Post-op Pain Management:    Induction: Intravenous  PONV Risk Score and Plan: 2 and Propofol  infusion and Treatment may vary due to age or medical condition  Airway Management Planned: Nasal Cannula  Additional Equipment:   Intra-op Plan:   Post-operative Plan:   Informed Consent: I have reviewed the patients History and Physical, chart, labs and discussed the procedure including the risks, benefits and alternatives for the proposed anesthesia with the patient or authorized representative who has indicated his/her understanding and acceptance.     Dental advisory given  Plan Discussed with: CRNA, Anesthesiologist and Surgeon  Anesthesia Plan Comments:         Anesthesia Quick Evaluation  "

## 2024-08-13 NOTE — Assessment & Plan Note (Signed)
 Continue testosterone  infusion as an outpatient

## 2024-08-13 NOTE — Progress Notes (Signed)
" °  Carryover admission to the Day Admitter.  I discussed this case with the EDP, Olam Slocumb, PA.  Per these discussions:   This is a 79 year old male with history of paroxysmal atrial fibrillation chronically anticoagulated on Eliquis , CKD 3B with baseline creatinine 1.4-1.8, who is being admitted with atrial fibrillation with RVR after developing palpitations earlier this evening.   He has undergone 2 prior ablation procedures for his history of paroxysmal atrial fibrillation with Dr. Inocencio, but does intermittently go back into atrial fibrillation.  He developed new onset palpitations with some mild chest pressure earlier on 08/12/2024, with the symptoms similar to the symptoms he has experienced at times of previous episodes of atrial fibrillation with RVR.  EMS noted the patient to be in atrial fibrillation with RVR, with initial heart rates in the 140s.   He received some IV fluids via EMS, and heart rate has been subsequently lower will remaining in atrial fibrillation, with most recent heart rates now in the 100s, associated with resolution of prior chest pressure.  Systolic pressures in the low 899d he is chronically anticoagulated on Eliquis .  Initial troponin 32, with repeat troponin currently pending.  Presenting BMP reflects potassium 4.1, along with creatinine of 1.8, which appears to be consistent with his baseline.  Magnesium  level 2.4.  1 view chest x-ray read as showing mild patchy airspace opacities in the area of the lingula as well as the bilateral lower lobes which, per radiology, suggestive of pneumonia.  However, EDP conveys that the patient denies any recent cough, subjective fever, chills.  Consequently, we will hold off on initiation of IV antibiotics at this time.  proBNP is currently pending, and I will add on a procalcitonin level to further assess.   EDP is ordered diltiazem drip, although this is not yet been initiated due to interval spontaneous improvement in rate  control.   Additionally, EDP is contacting on-call cardiology to assess for any additional recommendations.  I have placed an order for observation for further evaluation and management of presenting atrial fibrillation with RVR.  I have placed some additional preliminary admit orders via the adult multi-morbid admission order set. I have also ordered procalcitonin level, prn IV Lopressor  for sustained heart rates greater than 130.  I have also resumed patient's home Eliquis , and I have ordered morning labs in the form of CMP, CBC.    Eva Pore, DO Hospitalist  "

## 2024-08-13 NOTE — Discharge Summary (Signed)
 " Physician Discharge Summary   Patient: [name] MRN: [id number] DOB: August 12, 1945  Admit date:     08/13/2024  Discharge date: 08/13/24  Discharge Physician: Adriana DELENA Grams   PCP: Otho Darnelle BRAVO, MD   Recommendations at discharge:   Follow-up with cardiologist as scheduled  Discharge Diagnoses: Principal Problem:   Atrial fibrillation with RVR (HCC) Active Problems:   HTN (hypertension)   Hyperlipidemia   History of migraine   Hypogonadism in male   Anxiety and depression   Insomnia   OSA on CPAP   Acute diastolic congestive heart failure (HCC)   CKD (chronic kidney disease) stage 2, GFR 60-89 ml/min  Resolved Problems:   * No resolved hospital problems. *  Hospital Course: [name] is a 79 year old male with history of paroxysmal A-fib (on Eliquis ) CK DII! (baseline CR 1.4-1.8), HTN, HLD, CAD CABG 1998, non-STEMI,?  Bicuspid aortic valve, AAA (4.7 cm)...   Presenting with palpitation.  Some chest pain.  Reports symptoms started yesterday.  Similar symptoms as previous episodes.  Did not have any shortness of breath.  Denies any change in medications, or recent illnesses.  Denies of having any fever, chills, nausea or vomiting. Patient reports has history of paroxysmal A-fib with 2 prior ablation with Dr. Inocencio (March 2021 and January 2023)  ED Evaluation: Blood pressure 121/70, pulse 99, temperature 98.3 F (36.8 C), temperature source Oral, resp. rate 14, height 5' 7 (1.702 m), weight 67.1 kg, SpO2 97%.  LABs: Hemoglobin 0.9, BUN 29, creatinine 1.84, 1.77, calcium  8.0, potassium 4.4 magnesium  2.4, troponin 33, Pro-Cal < 0.10, BNP 2,143.0   CXR : Mild patchy airspace opacity, bilateral lower lobe -possible pneumonia, per report from EMS patient was found in a heart rate of 140s, systolic BP in 100s, IV fluid was given,  Patient has been started on Cardizem  drip, continue Eliquis .    * Atrial fibrillation with RVR (HCC) With history of paroxysmal  atrial fibrillation, and a prior history of 2 ablations -Started on Cardizem  drip -titrating for heart rate less than 100 -Continue Eliquis  -Cardiology consulted, appreciate further evaluation and recommendations Chest pain has resolved The patient was seen and evaluated by cardiologist, was seen in [facility] lap, patient was found in normal sinus rhythm, no further cardioversion was needed  Patient was cleared for discharge  HTN (hypertension) Blood pressure stabilized, off Cardizem  drip -Continue home medication of losartan , amlodipine , Lopressor    Hyperlipidemia Continue rosuvastatin   CKD (chronic kidney disease) stage 2, GFR 60-89 ml/min Acute on CKD Likely CKD stage IIb Lab Results  Component Value Date   CREATININE 1.77 (H) 08/13/2024   CREATININE 1.84 (H) 08/13/2024   CREATININE 1.46 (H) 08/07/2023  BUN 28, 29, GFR 38, 39   Acute diastolic congestive heart failure (HCC) Last echo 09/10/2022, EF 62%, normal LV and RV function -Elevated BNP, monitoring I's and O's, -No signs of respiratory stress, or volume overload -Gentle IV fluid hydration due to elevated BUN/creatinine from baseline  OSA on CPAP Stable on room air, continue CPAP nightly  Insomnia - Continue trazodone , as needed Xanax   Anxiety and depression Mood stable, continue home medication including Valium   Hypogonadism in male Continue testosterone  infusion as an outpatient  History of migraine History of migraine headaches -Monitoring closely -Continue as needed analgesics  History of aortic aneurysm  stable at 4.7 cm   Consultants: Cardiology Procedures performed: None Disposition: Home Diet recommendation:  Cardiac diet DISCHARGE MEDICATION: Allergies as of 08/13/2024  Reactions   Dog Epithelium (canis Lupus Familiaris) Itching, Anxiety, Palpitations, Other (See Comments), Cough, Shortness Of Breath   Other reaction(s): Respiratory Distress (ALLERGY/intolerance), Wheezing  (ALLERGY/intolerance)   Ciprofloxacin Other (See Comments)   Avoid as patient has an aortic aneurysm and this drug can increase risk of Aortic Dissection   Mushroom Extract Complex (obsolete) Nausea Only, Other (See Comments)   Severe vertigo and headaches        Medication List     STOP taking these medications    Excedrin Migraine 250-250-65 MG tablet Generic drug: aspirin -acetaminophen -caffeine        TAKE these medications    ALPRAZolam  1 MG 24 hr tablet Commonly known as: XANAX  XR TAKE 1 TABLET BY MOUTH DAILY What changed: Another medication with the same name was removed. Continue taking this medication, and follow the directions you see here.   amLODipine  5 MG tablet Commonly known as: NORVASC  TAKE 1 TABLET BY MOUTH DAILY   apixaban  5 MG Tabs tablet Commonly known as: ELIQUIS  Take 1 tablet (5 mg total) by mouth 2 (two) times daily.   butalbital -aspirin -caffeine -codeine  50-325-40-30 MG capsule Commonly known as: FIORINAL WITH CODEINE  Take 1 capsule by mouth every 4 (four) hours as needed for pain.   [employer] Tabs Take 1 tablet by mouth daily with breakfast.   diazepam  5 MG tablet Commonly known as: VALIUM  Take 2.5-5 mg by mouth 2 (two) times daily as needed for anxiety.   furosemide  20 MG tablet Commonly known as: LASIX  TAKE 1 TABLET BY MOUTH DAILY   ibuprofen 200 MG tablet Commonly known as: ADVIL Take 200-400 mg by mouth every 8 (eight) hours as needed (for arthritis in the feet).   losartan  25 MG tablet Commonly known as: COZAAR  TAKE 1 TABLET BY MOUTH DAILY   Magnesium  125 MG Caps One  capsule at bedtime. po What changed:  how much to take how to take this when to take this   Nebivolol  HCl 20 MG Tabs TAKE 1 TABLET BY MOUTH TWICE A DAY IN THE MORNING AND AT BEDTIME What changed: See the new instructions.   niacin  500 MG tablet Commonly known as: VITAMIN B3 Take 500 mg by mouth at bedtime.   rosuvastatin  20 MG  tablet Commonly known as: CRESTOR  TAKE 1 TABLET BY MOUTH DAILY   tadalafil 5 MG tablet Commonly known as: CIALIS Take 5 mg by mouth every evening.   Testosterone  40.5 MG/2.5GM (1.62%) Gel Place 4 Pump onto the skin daily. Apply 4 pumps to each shoulder every morning   traZODone  50 MG tablet Commonly known as: DESYREL  TAKE 1.5 TABLETS BY MOUTH AT BEDTIME What changed: See the new instructions.   Turmeric 500 MG Caps Take 1,000 mg by mouth daily.   vitamin C 1000 MG tablet Take 1,000 mg by mouth daily.   vitamin E  180 MG (400 UNITS) capsule Take 400 Units by mouth at bedtime.        Discharge Exam: Filed Weights   08/13/24 0114  Weight: 67.1 kg        General:  AAO x 3,  cooperative, no distress;   HEENT:  Normocephalic, PERRL, otherwise with in Normal limits   Neuro:  CNII-XII intact. , normal motor and sensation, reflexes intact   Lungs:   Clear to auscultation BL, Respirations unlabored,  No wheezes / crackles  Cardio:    S1/S2, RRR, No murmure, No Rubs or Gallops   Abdomen:  Soft, non-tender, bowel sounds active all four quadrants, no guarding  or peritoneal signs.  Muscular  skeletal:  Limited exam -global generalized weaknesses - in bed, able to move all 4 extremities,   2+ pulses,  symmetric, No pitting edema  Skin:  Dry, warm to touch, negative for any Rashes,  Wounds: Please see nursing documentation          Condition at discharge: Stable The results of significant diagnostics from this hospitalization (including imaging, microbiology, ancillary and laboratory) are listed below for reference.   Imaging Studies: DG Chest Port 1 View Result Date: 08/13/2024 EXAM: 1 VIEW(S) XRAY OF THE CHEST 08/13/2024 01:51:11 AM COMPARISON: 04/06/2023 CLINICAL HISTORY: Chest pressure, palpitations. FINDINGS: LUNGS AND PLEURA: Mild patchy lingular and bilateral lower lobe opacities, suspicious for pneumonia. No pleural effusion. No pneumothorax. HEART AND  MEDIASTINUM: Cardiomegaly. Calcified tortuous aorta. BONES AND SOFT TISSUES: Sternal wires noted. No acute osseous abnormality. IMPRESSION: 1. Mild patchy lingular and bilateral lower lobe opacities, suspicious for pneumonia. 2. Aortic Atherosclerosis (ICD10-I70.0). Electronically signed by: Pinkie Pebbles MD 08/13/2024 01:54 AM EST RP Workstation: [id number]    Microbiology: Results for orders placed or performed during the hospital encounter of 09/25/19  SARS CORONAVIRUS 2 (TAT 6-24 HRS) Nasopharyngeal Nasopharyngeal Swab     Status: None   Collection Time: 09/25/19  2:06 PM   Specimen: Nasopharyngeal Swab  Result Value Ref Range Status   SARS Coronavirus 2 NEGATIVE NEGATIVE Final    Comment: (NOTE) SARS-CoV-2 target nucleic acids are NOT DETECTED. The SARS-CoV-2 RNA is generally detectable in upper and lower respiratory specimens during the acute phase of infection. Negative results do not preclude SARS-CoV-2 infection, do not rule out co-infections with other pathogens, and should not be used as the sole basis for treatment or other patient management decisions. Negative results must be combined with clinical observations, patient history, and epidemiological information. The expected result is Negative. Fact Sheet for Patients: hairslick.no Fact Sheet for Healthcare Providers: quierodirigir.com This test is not yet approved or cleared by the [place]  FDA and  has been authorized for detection and/or diagnosis of SARS-CoV-2 by FDA under an Emergency Use Authorization (EUA). This EUA will remain  in effect (meaning this test can be used) for the duration of the COVID-19 declaration under Section 56 4(b)(1) of the Act, 21 U.S.C. section 360bbb-3(b)(1), unless the authorization is terminated or revoked sooner. Performed at [facility], [address], [place], [place] [address]     Labs: CBC: Recent Labs  Lab  08/13/24 0116 08/13/24 0357  WBC 7.3 7.4  NEUTROABS 5.4 5.1  HGB 12.7* 11.9*  HCT 37.2* 34.7*  MCV 106.3* 106.1*  PLT 190 186   Basic Metabolic Panel: Recent Labs  Lab 08/13/24 0116 08/13/24 0357  NA 141 139  K 4.1 4.4  CL 103 106  CO2 26 25  GLUCOSE 135* 123*  BUN 28* 29*  CREATININE 1.84* 1.77*  CALCIUM  8.1* 8.0*  MG 2.4  --    Liver Function Tests: Recent Labs  Lab 08/13/24 0357  AST 34  ALT 24  ALKPHOS 109  BILITOT 0.2  PROT 5.4*  ALBUMIN 3.5   CBG: No results for input(s): GLUCAP in the last 168 hours.  Discharge time spent: greater than 30 minutes.  Signed: Adriana DELENA Grams, MD [facility] 08/13/2024 "

## 2024-08-13 NOTE — Assessment & Plan Note (Signed)
 History of migraine headaches -Monitoring closely -Continue as needed analgesics

## 2024-08-13 NOTE — Assessment & Plan Note (Signed)
 Acute on CKD Likely CKD stage IIb Lab Results  Component Value Date   CREATININE 1.77 (H) 08/13/2024   CREATININE 1.84 (H) 08/13/2024   CREATININE 1.46 (H) 08/07/2023  BUN 28, 29, GFR 38, 39 Gentle IV fluid hydration, avoiding nephrotoxins Adjusting diuretics

## 2024-08-13 NOTE — Assessment & Plan Note (Signed)
 Stable on room air, continue CPAP nightly

## 2024-08-13 NOTE — Assessment & Plan Note (Signed)
-   Continue trazodone , as needed Xanax

## 2024-08-13 NOTE — ED Notes (Signed)
 CCMD called and put pt on the monitor

## 2024-08-13 NOTE — Hospital Course (Addendum)
 [name] is a 79 year old male with history of paroxysmal A-fib (on Eliquis ) CK DII! (baseline CR 1.4-1.8), HTN, HLD, CAD CABG 1998, non-STEMI,?  Bicuspid aortic valve, AAA (4.7 cm)...   Presenting with palpitation.  Some chest pain.  Reports symptoms started yesterday.  Similar symptoms as previous episodes.  Did not have any shortness of breath.  Denies any change in medications, or recent illnesses.  Denies of having any fever, chills, nausea or vomiting. Patient reports has history of paroxysmal A-fib with 2 prior ablation with Dr. Inocencio (March 2021 and January 2023)  ED Evaluation: Blood pressure 121/70, pulse 99, temperature 98.3 F (36.8 C), temperature source Oral, resp. rate 14, height 5' 7 (1.702 m), weight 67.1 kg, SpO2 97%.  LABs: Hemoglobin 0.9, BUN 29, creatinine 1.84, 1.77, calcium  8.0, potassium 4.4 magnesium  2.4, troponin 33, Pro-Cal < 0.10, BNP 2,143.0   CXR : Mild patchy airspace opacity, bilateral lower lobe -possible pneumonia, per report from EMS patient was found in a heart rate of 140s, systolic BP in 100s, IV fluid was given,  Patient has been started on Cardizem drip, continue Eliquis .

## 2024-08-13 NOTE — Assessment & Plan Note (Signed)
 Monitoring blood pressure, stable -Continue Lopressor , currently on Cardizem drip, monitoring BP closely -Restarting losartan  and amlodipine  cautiously

## 2024-08-13 NOTE — Assessment & Plan Note (Signed)
 Mood stable, continue home medication as needed Xanax , trazodone , Valium ,

## 2024-08-17 ENCOUNTER — Other Ambulatory Visit: Payer: Self-pay | Admitting: Cardiology

## 2024-08-17 NOTE — Progress Notes (Signed)
 " Cardiology Office Note:  .   Date:  08/20/2024  ID:  [name], DOB 07-13-1946, MRN [id number] PCP: Otho Darnelle BRAVO, MD  [facility] Providers Cardiologist:  Wilbert Bihari, MD Electrophysiologist:  Soyla Gladis Norton, MD    Patient Profile: .      PMH Coronary artery disease S/p CABG x 2 in 1998 (LIMA-LAD, vein graft to RCA) LHC 08/2018 (NSTEMI): Ostial LAD 100%, ost to prox RCA 100%, origin lesion 100%, prox Cx 50%, patent LIMA to LAD, occluded graft to RCA Chronic diastolic heart failure Echo 08/26/2022: EF 60-65, mild LAE, mild AI, mild calcification aortic valve without stenosis, aortic root dil 51 mm, ascending aortic dil 47 mm PAF/a-flutter Onset 05/11/2019 Chronic anticoagulation Successful DCCV performed at [facility] ED  DCCV 08/10/2019: successful DCCV x 2 05/21/2021: successful A-flutter ablation 09/29/2019 DCCV 07/12/2021: successful A-fib ablation 08/02/2021 SVT Valve disease Aortic aneurysm Followed by CT surgery Aneurysm of aortic root  51 mm on TTE 08/26/2022 47 mm on CT 09/11/22 Ascending aortic dilatation  47 mm on TTE 08/26/2022 46 mm on CT 09/29/2023 47 mm on CT 03/15/24 Hypertension Hyperlipidemia OSA on CPAP CKD Stage IV  Follow[facility] by [facility]  He established with cardiology 12/22/2014, seen by Dr. Bihari. Referred to EP after being found to be in atrial flutter in [facility] ED October 2020. He is followed by Drs. Turner and Camnitz for the above outlined history.  Seen by Barnie Hila, NP on 11/05/2022 for ED follow-up. On 10/10/2022, he went to ED for chest pain and palpitations. Chest pain described as dull ache in central chest with radiation to the back and associated shortness of breath. Initial EKG showed A fib at 104 bpm. He had tenderness to palpitation of his chest. Trop negative x 2. He converted to NSR with IV fluids and was discharged. At office visit, he reported dehydration as potential trigger. He continued to  have chest tenderness felt to be 2/2 muscular strain. He admitted depression following a break-up with his long-time girlfriend and feeling isolated. He reported anxiety around heart rate and concerns about his heart and he was encouraged to consider an Apple watch.   Seen by Dr. Norton 07/31/2023 at which time he reported that he was happy with current management of occasional episodes of a fib. He was encouraged to follow-up in 6 months with [facility].  Seen by me on 08/07/23 or follow-up of hypertension and CAD. Feeling well from a cardiac perspective. Struggling for several months with break-up from a girlfriend. Had an injection in his eye by the retina specialist today and is having some tearing and blurry vision. Admits he has not been as active at the gym recently, planning to resume workouts soon. Admits to some dietary indiscretion as well with diet and regular soda. No chest pain, shortness of breath, orthopnea, PND, palpitations, presyncope, syncope.  No bleeding concerns. Stable aortic root and ascending aortic aneurysm followed by Dr. Lucas, CT surgery. Was advised to have repeat CTA and follow-up in 1 year which is due 09/2023. Have encouraged him to call the office for an appointment. Compliant with CPAP. Lipids were well controlled.   Admission 07/2024 with recurrent atrial fibrillation and planned for DCCV, however was in NSR when he arrived in lab for procedure. Was having chest pain which improved with rate control. BNP elevated at 2143 and trops elevated at 32 >>33 >> 38.        History of Present Illness: .  History of Present Illness [name] is a very pleasant 79 year old male who is here for follow-up of chest pain following recent recent emergency room visit. He recently developed a rapid heartbeat with chest pressure that prompted an emergency room visit. Cardioversion was considered but not performed because he returned to normal rhythm spontaneously. He is unsure  when his next CT scan and echocardiogram are scheduled. He has undergone two ablations for a fib. Since returning home 08/13/24, he has had no further episodes of chest pain or palpitations.  History of aortic aneurysm followed with CT imaging, with the last evaluation in August. He owns a treadmill and previously enjoyed using it but is now afraid to exercise because of concern about his heart. He is compliant with CPAP nightly for sleep apnea. He notices ankle swelling, worse at the end of the day. He denies orthopnea, PND or dyspnea. He feels lonely and is afraid of having a cardiac event while alone, as he has no nearby family. We discussed efforts to improve his social support.   Discussed the use of AI scribe software for clinical note transcription with the patient, who gave verbal consent to proceed.   ROS: See HPI       Studies Reviewed: .        Risk Assessment/Calculations:    CHA2DS2-VASc Score = 5   This indicates a 7.2% annual risk of stroke. The patient's score is based upon: CHF History: 1 HTN History: 1 Diabetes History: 0 Stroke History: 0 Vascular Disease History: 1 Age Score: 2 Gender Score: 0            Physical Exam:   VS:  BP 120/64   Pulse 68   Ht 5' 7 (1.702 m)   Wt 147 lb 9.6 oz (67 kg)   SpO2 98%   BMI 23.12 kg/m    Wt Readings from Last 3 Encounters:  08/19/24 147 lb 9.6 oz (67 kg)  08/13/24 148 lb (67.1 kg)  06/24/24 152 lb 6.4 oz (69.1 kg)    GEN: Well nourished, well developed in no acute distress NECK: No JVD; No carotid bruits CARDIAC: RRR, no murmurs, rubs, gallops RESPIRATORY:  Clear to auscultation without rales, wheezing or rhonchi  ABDOMEN: Soft, non-tender, non-distended EXTREMITIES:  No edema; No deformity     ASSESSMENT AND PLAN: .    CAD without angina S/p CABG x 2 in 1998 (LIMA-LAD, vein graft to RCA). LHC 08/2018 (NSTEMI): Ostial LAD 100%, ost to prox RCA 100%, origin lesion 100%, prox Cx 50%, patent LIMA to LAD, occluded  graft to RCA. Mild troponin elevation during recent admission felt to be tachycardia mediated. He denies chest pain since admission. Felt better after he converted to sinus rhythm. No symptoms concerning for angina. No indication for further ischemic evaluation at this time.  He is not on aspirin  given need for OAC. - Continue rosuvastatin , Nebivolol   Hypertension BP is well-controlled.  No concerns with recent elevations in BP.  He is having ankle swelling felt secondary to amlodipine . Creatinine elevated recently during hospital admission. Will change anti-hypertensive therapy to eliminate amlodipine  and change losartan  to valsartan  for potential better BP control without amlodipine . - Stop amlodipine  and losartan  - Start valsartan  80 mg daily - BMET in one week  Persistent atrial fibrillation on chronic anticoagulation Recent admission for AF RVR. He spontaneously converted prior to discharge. Maintaining sinus rhythm today. HR is well controlled.  No tachycardia or palpitations since hospital discharge 08/13/2024. No  bleeding concerns.  He has been followed consistently by EP team - Continue Eliquis  5 mg twice daily which is appropriate dose for stroke prevention for CHADsVASc score of 5 - Follow-up with EP team in 1 month  Aortic dilatation: Stable aortic root and ascending aortic aneurysm followed by Dr. Lucas, CT surgery. He was advised to have repeat CTA and follow-up in 6 months. He avoids heavy lifting.  - Continue good BP control - Management per CT surgery  Hyperlipidemia LDL goal < 55 Lipid panel completed 08/13/2024 with total cholesterol 73, triglycerides 122, HDL 32, and LDL 16.  Lipids are very well-controlled. - Continue rosuvastatin   OSA on CPAP Reports compliance with CPAP. No acute concerns today.  - Management per neurology   CKD Stage 3b Reports he is now being followed by nephrology. Scr mildly elevated during recent hospitalization. Change in antihypertensive  therapy as noted above. - Recheck BMET in one week        Dispo: Keep your February appointment with Charlies Arthur, PA/6 months with general cardiology  Signed, Rosaline Bane, NP-C "

## 2024-08-19 ENCOUNTER — Encounter (HOSPITAL_BASED_OUTPATIENT_CLINIC_OR_DEPARTMENT_OTHER): Payer: Self-pay | Admitting: Nurse Practitioner

## 2024-08-19 ENCOUNTER — Ambulatory Visit (INDEPENDENT_AMBULATORY_CARE_PROVIDER_SITE_OTHER): Admitting: Nurse Practitioner

## 2024-08-19 VITALS — BP 120/64 | HR 68 | Ht 67.0 in | Wt 147.6 lb

## 2024-08-19 DIAGNOSIS — I7121 Aneurysm of the ascending aorta, without rupture: Secondary | ICD-10-CM

## 2024-08-19 DIAGNOSIS — I10 Essential (primary) hypertension: Secondary | ICD-10-CM | POA: Diagnosis not present

## 2024-08-19 DIAGNOSIS — G4733 Obstructive sleep apnea (adult) (pediatric): Secondary | ICD-10-CM | POA: Diagnosis not present

## 2024-08-19 DIAGNOSIS — Z7901 Long term (current) use of anticoagulants: Secondary | ICD-10-CM | POA: Diagnosis not present

## 2024-08-19 DIAGNOSIS — Z951 Presence of aortocoronary bypass graft: Secondary | ICD-10-CM

## 2024-08-19 DIAGNOSIS — I2510 Atherosclerotic heart disease of native coronary artery without angina pectoris: Secondary | ICD-10-CM

## 2024-08-19 DIAGNOSIS — E785 Hyperlipidemia, unspecified: Secondary | ICD-10-CM

## 2024-08-19 DIAGNOSIS — I480 Paroxysmal atrial fibrillation: Secondary | ICD-10-CM

## 2024-08-19 DIAGNOSIS — N1832 Chronic kidney disease, stage 3b: Secondary | ICD-10-CM | POA: Diagnosis not present

## 2024-08-19 DIAGNOSIS — I4819 Other persistent atrial fibrillation: Secondary | ICD-10-CM

## 2024-08-19 MED ORDER — VALSARTAN 80 MG PO TABS: 80.0000 mg | ORAL_TABLET | Freq: Every day | ORAL | 3 refills | Status: AC

## 2024-08-19 NOTE — Patient Instructions (Signed)
 Medication Instructions:   DISCONTINUE Amlodipine .  DISCONTINUE Losartan .  START Valsartan  one (1) tablet by mouth ( 80 mg) daily.    *If you need a refill on your cardiac medications before your next appointment, please call your pharmacy*  Lab Work:  Your physician recommends that you return for lab work on Thursday or Friday next week. No fasting.    If you have labs (blood work) drawn today and your tests are completely normal, you will receive your results only by: MyChart Message (if you have MyChart) OR A paper copy in the mail If you have any lab test that is abnormal or we need to change your treatment, we will call you to review the results.  Testing/Procedures:  None ordered.  Follow-Up: At [facility], you and your health needs are our priority.  As part of our continuing mission to provide you with exceptional heart care, our providers are all part of one team.  This team includes your primary Cardiologist (physician) and Advanced Practice Providers or APPs (Physician Assistants and Nurse Practitioners) who all work together to provide you with the care you need, when you need it.  Your next appointment:   6 month(s)  Provider:   Wilbert Bihari, MD    We recommend signing up for the patient portal called MyChart.  Sign up information is provided on this After Visit Summary.  MyChart is used to connect with patients for Virtual Visits (Telemedicine).  Patients are able to view lab/test results, encounter notes, upcoming appointments, etc.  Non-urgent messages can be sent to your provider as well.   To learn more about what you can do with MyChart, go to [email or web address].   Other Instructions  Your physician wants you to follow-up in: 6 months.  You will receive a reminder letter in the mail two months in advance. If you don't receive a letter, please call our office to schedule the follow-up appointment.

## 2024-08-20 ENCOUNTER — Telehealth: Payer: Self-pay | Admitting: Nurse Practitioner

## 2024-08-20 NOTE — Telephone Encounter (Signed)
 Pt will stop by today for his glasses he forgot,

## 2024-08-24 ENCOUNTER — Encounter (HOSPITAL_COMMUNITY): Payer: Self-pay | Admitting: Cardiovascular Disease

## 2024-09-17 ENCOUNTER — Ambulatory Visit: Admitting: Physician Assistant

## 2024-12-23 ENCOUNTER — Ambulatory Visit: Admitting: Neurology
